# Patient Record
Sex: Female | Born: 1995
Health system: Southern US, Community
[De-identification: ages and names within clinical notes are randomized; demographics above are authoritative.]

## PROBLEM LIST (undated history)

## (undated) DIAGNOSIS — K219 Gastro-esophageal reflux disease without esophagitis: Secondary | ICD-10-CM

## (undated) DIAGNOSIS — R55 Syncope and collapse: Secondary | ICD-10-CM

## (undated) DIAGNOSIS — R0600 Dyspnea, unspecified: Secondary | ICD-10-CM

## (undated) DIAGNOSIS — IMO0001 Reserved for inherently not codable concepts without codable children: Secondary | ICD-10-CM

## (undated) DIAGNOSIS — R519 Headache, unspecified: Secondary | ICD-10-CM

## (undated) DIAGNOSIS — R51 Headache: Secondary | ICD-10-CM

## (undated) DIAGNOSIS — Z9289 Personal history of other medical treatment: Secondary | ICD-10-CM

## (undated) DIAGNOSIS — F5000 Anorexia nervosa, unspecified: Secondary | ICD-10-CM

## (undated) DIAGNOSIS — E282 Polycystic ovarian syndrome: Secondary | ICD-10-CM

## (undated) DIAGNOSIS — I10 Essential (primary) hypertension: Secondary | ICD-10-CM

## (undated) HISTORY — DX: Polycystic ovarian syndrome: E28.2

## (undated) HISTORY — DX: Reserved for inherently not codable concepts without codable children: IMO0001

## (undated) HISTORY — DX: Gastro-esophageal reflux disease without esophagitis: K21.9

## (undated) HISTORY — PX: OTHER SURGICAL HISTORY: SHX169

## (undated) HISTORY — DX: Morbid (severe) obesity due to excess calories: E66.01

## (undated) HISTORY — DX: Anorexia nervosa, unspecified: F50.00

## (undated) HISTORY — DX: Syncope and collapse: R55

---
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
## 2015-02-07 ENCOUNTER — Telehealth: Payer: Self-pay | Admitting: Internal Medicine

## 2015-02-07 NOTE — Telephone Encounter (Signed)
Her parents are patients of yours. Her father is Zohair Coverdale MRN [id number]. They were wondering if she can establish care with you along with her twin sister.  ° °

## 2015-02-10 NOTE — Telephone Encounter (Signed)
yes

## 2015-02-20 ENCOUNTER — Other Ambulatory Visit (INDEPENDENT_AMBULATORY_CARE_PROVIDER_SITE_OTHER): Payer: 59

## 2015-02-20 ENCOUNTER — Ambulatory Visit: Payer: No Typology Code available for payment source | Admitting: Internal Medicine

## 2015-02-20 ENCOUNTER — Ambulatory Visit (INDEPENDENT_AMBULATORY_CARE_PROVIDER_SITE_OTHER): Payer: 59 | Admitting: Internal Medicine

## 2015-02-20 ENCOUNTER — Encounter: Payer: Self-pay | Admitting: Internal Medicine

## 2015-02-20 VITALS — BP 142/78 | HR 80 | Temp 98.5°F | Resp 18 | Ht 64.0 in | Wt 298.0 lb

## 2015-02-20 DIAGNOSIS — K219 Gastro-esophageal reflux disease without esophagitis: Secondary | ICD-10-CM

## 2015-02-20 DIAGNOSIS — I10 Essential (primary) hypertension: Secondary | ICD-10-CM | POA: Insufficient documentation

## 2015-02-20 HISTORY — DX: Essential (primary) hypertension: I10

## 2015-02-20 LAB — LIPID PANEL
Cholesterol: 158 mg/dL (ref 0–200)
HDL: 32.9 mg/dL — ABNORMAL LOW (ref >39.00)
LDL Cholesterol: 99 mg/dL (ref 0–99)
NonHDL: 125.1
Total CHOL/HDL Ratio: 5
Triglycerides: 130 mg/dL (ref 0.0–149.0)
VLDL: 26 mg/dL (ref 0.0–40.0)

## 2015-02-20 LAB — COMPREHENSIVE METABOLIC PANEL
ALT: 16 U/L (ref 0–35)
AST: 15 U/L (ref 0–37)
Albumin: 3.6 g/dL (ref 3.5–5.2)
Alkaline Phosphatase: 72 U/L (ref 47–119)
BUN: 11 mg/dL (ref 6–23)
CO2: 25 mEq/L (ref 19–32)
Calcium: 9.4 mg/dL (ref 8.4–10.5)
Chloride: 104 mEq/L (ref 96–112)
Creatinine, Ser: 0.56 mg/dL (ref 0.40–1.20)
GFR: 147.89 mL/min (ref >60.00)
Glucose, Bld: 114 mg/dL — ABNORMAL HIGH (ref 70–99)
Potassium: 3.5 mEq/L (ref 3.5–5.1)
Sodium: 137 mEq/L (ref 135–145)
Total Bilirubin: 0.2 mg/dL (ref 0.2–1.2)
Total Protein: 7.6 g/dL (ref 6.0–8.3)

## 2015-02-20 LAB — CBC WITH DIFFERENTIAL/PLATELET
Basophils Absolute: 0 10*3/uL (ref 0.0–0.1)
Basophils Relative: 0.4 % (ref 0.0–3.0)
Eosinophils Absolute: 0.1 10*3/uL (ref 0.0–0.7)
Eosinophils Relative: 1.4 % (ref 0.0–5.0)
HCT: 39.5 % (ref 36.0–49.0)
Hemoglobin: 13 g/dL (ref 12.0–16.0)
Lymphocytes Relative: 43.1 % (ref 24.0–48.0)
Lymphs Abs: 4.2 10*3/uL — ABNORMAL HIGH (ref 0.7–4.0)
MCHC: 32.8 g/dL (ref 31.0–37.0)
MCV: 75.3 fl — ABNORMAL LOW (ref 78.0–98.0)
Monocytes Absolute: 0.7 10*3/uL (ref 0.1–1.0)
Monocytes Relative: 7.1 % (ref 3.0–12.0)
Neutro Abs: 4.7 10*3/uL (ref 1.4–7.7)
Neutrophils Relative %: 48 % (ref 43.0–71.0)
Platelets: 330 10*3/uL (ref 150.0–575.0)
RBC: 5.24 Mil/uL (ref 3.80–5.70)
RDW: 14.4 % (ref 11.4–15.5)
WBC: 9.8 10*3/uL (ref 4.5–13.5)

## 2015-02-20 LAB — TSH: TSH: 1.7 u[IU]/mL (ref 0.40–5.00)

## 2015-02-20 MED ORDER — OMEPRAZOLE 20 MG PO CPDR: 20.0000 mg | DELAYED_RELEASE_CAPSULE | Freq: Every day | ORAL | Status: DC

## 2015-02-20 MED ORDER — NORGESTIM-ETH ESTRAD TRIPHASIC 0.18/0.215/0.25 MG-35 MCG PO TABS: 1.0000 | ORAL_TABLET | Freq: Every day | ORAL | Status: DC

## 2015-02-20 MED ORDER — LISINOPRIL 5 MG PO TABS: 10.0000 mg | ORAL_TABLET | Freq: Every day | ORAL | Status: DC

## 2015-02-20 MED ORDER — PRENATAL 27-0.8 MG PO TABS: 1.0000 | ORAL_TABLET | Freq: Every day | ORAL | Status: DC

## 2015-02-20 MED ORDER — SPIRONOLACTONE 25 MG PO TABS: 50.0000 mg | ORAL_TABLET | Freq: Every day | ORAL | Status: DC

## 2015-02-20 NOTE — Assessment & Plan Note (Signed)
She is doing well on the PPI Will continue 

## 2015-02-20 NOTE — Assessment & Plan Note (Signed)
Her BP is adequately well controlled Will cont the current meds She will work on her lifestyle modifications Will monitor her lytes and renal function today

## 2015-02-20 NOTE — Progress Notes (Signed)
Pre visit review using our clinic review tool, if applicable. No additional management support is needed unless otherwise documented below in the visit note. 

## 2015-02-20 NOTE — Patient Instructions (Signed)

## 2015-02-20 NOTE — Progress Notes (Signed)
   Subjective:    Patient ID: Stacey Holland, female    DOB: [birth year], 19 y.o.   MRN: [id number]  Hypertension This is a chronic problem. The current episode started more than 1 year ago. The problem has been gradually improving since onset. The problem is controlled. Pertinent negatives include no anxiety, blurred vision, chest pain, headaches, malaise/fatigue, neck pain, orthopnea, palpitations, peripheral edema, PND, shortness of breath or sweats. There are no associated agents to hypertension. Risk factors for coronary artery disease include obesity. Past treatments include diuretics and ACE inhibitors. Compliance problems include diet and exercise.       Review of Systems  Constitutional: Negative.  Negative for fever, chills, malaise/fatigue, diaphoresis, appetite change and fatigue.  HENT: Negative.   Eyes: Negative.  Negative for blurred vision.  Respiratory: Negative.  Negative for cough, choking, chest tightness, shortness of breath and stridor.   Cardiovascular: Negative.  Negative for chest pain, palpitations, orthopnea, leg swelling and PND.  Gastrointestinal: Negative.  Negative for nausea, vomiting, abdominal pain, diarrhea and constipation.  Endocrine: Negative.   Genitourinary: Negative.   Musculoskeletal: Negative.  Negative for back pain, joint swelling, arthralgias and neck pain.  Skin: Negative.  Negative for rash.  Allergic/Immunologic: Negative.   Neurological: Negative.  Negative for dizziness, syncope, speech difficulty, light-headedness, numbness and headaches.  Hematological: Negative.  Negative for adenopathy. Does not bruise/bleed easily.  Psychiatric/Behavioral: Negative.        Objective:   Physical Exam  Constitutional: She is oriented to person, place, and time. She appears well-developed and well-nourished. No distress.  HENT:  Head: Normocephalic and atraumatic.  Mouth/Throat: Oropharynx is clear and moist. No oropharyngeal exudate.  Eyes:  Conjunctivae are normal. Right eye exhibits no discharge. Left eye exhibits no discharge. No scleral icterus.  Neck: Normal range of motion. Neck supple. No JVD present. No tracheal deviation present. No thyromegaly present.  Cardiovascular: Normal rate, regular rhythm, normal heart sounds and intact distal pulses.  Exam reveals no gallop and no friction rub.   No murmur heard. Pulmonary/Chest: Effort normal and breath sounds normal. No stridor. No respiratory distress. She has no wheezes. She has no rales. She exhibits no tenderness.  Abdominal: Soft. Bowel sounds are normal. She exhibits no distension and no mass. There is no tenderness. There is no rebound and no guarding.  Musculoskeletal: Normal range of motion. She exhibits no edema or tenderness.  Lymphadenopathy:    She has no cervical adenopathy.  Neurological: She is oriented to person, place, and time.  Skin: Skin is warm and dry. No rash noted. She is not diaphoretic. No erythema. No pallor.  Psychiatric: She has a normal mood and affect. Her behavior is normal. Judgment and thought content normal.  Vitals reviewed.    No results found for: WBC, HGB, HCT, PLT, GLUCOSE, CHOL, TRIG, HDL, LDLDIRECT, LDLCALC, ALT, AST, NA, K, CL, CREATININE, BUN, CO2, TSH, PSA, INR, GLUF, HGBA1C, MICROALBUR     Assessment & Plan:

## 2015-02-21 ENCOUNTER — Encounter: Payer: Self-pay | Admitting: Internal Medicine

## 2015-04-24 ENCOUNTER — Other Ambulatory Visit: Payer: Self-pay

## 2015-04-24 ENCOUNTER — Ambulatory Visit: Payer: 59 | Admitting: Internal Medicine

## 2015-04-24 DIAGNOSIS — I10 Essential (primary) hypertension: Secondary | ICD-10-CM

## 2015-04-24 DIAGNOSIS — K219 Gastro-esophageal reflux disease without esophagitis: Secondary | ICD-10-CM

## 2015-04-24 MED ORDER — LISINOPRIL 5 MG PO TABS: 10.0000 mg | ORAL_TABLET | Freq: Every day | ORAL | Status: DC

## 2015-04-24 MED ORDER — OMEPRAZOLE 20 MG PO CPDR: 20.0000 mg | DELAYED_RELEASE_CAPSULE | Freq: Every day | ORAL | Status: DC

## 2015-04-24 MED ORDER — NORGESTIM-ETH ESTRAD TRIPHASIC 0.18/0.215/0.25 MG-35 MCG PO TABS: 1.0000 | ORAL_TABLET | Freq: Every day | ORAL | Status: DC

## 2015-07-28 ENCOUNTER — Other Ambulatory Visit: Payer: Self-pay | Admitting: Internal Medicine

## 2015-09-03 ENCOUNTER — Other Ambulatory Visit: Payer: Self-pay | Admitting: Internal Medicine

## 2015-10-29 ENCOUNTER — Other Ambulatory Visit: Payer: Self-pay | Admitting: Internal Medicine

## 2015-12-02 ENCOUNTER — Other Ambulatory Visit: Payer: Self-pay | Admitting: Internal Medicine

## 2016-01-15 ENCOUNTER — Other Ambulatory Visit: Payer: Self-pay | Admitting: Internal Medicine

## 2016-02-28 ENCOUNTER — Other Ambulatory Visit: Payer: Self-pay | Admitting: Internal Medicine

## 2016-03-06 ENCOUNTER — Other Ambulatory Visit: Payer: Self-pay | Admitting: Internal Medicine

## 2016-03-07 NOTE — Telephone Encounter (Signed)
PT informed on two fills, Will send 30 day supply Must schedule an appt for CPE. Can we get on schedule?

## 2016-03-10 ENCOUNTER — Telehealth: Payer: Self-pay | Admitting: Internal Medicine

## 2016-03-10 NOTE — Telephone Encounter (Signed)
Noted  

## 2016-03-10 NOTE — Telephone Encounter (Signed)
Left patient vm to make CPE appt within the next month in order to keep refills going.

## 2016-04-03 ENCOUNTER — Other Ambulatory Visit (INDEPENDENT_AMBULATORY_CARE_PROVIDER_SITE_OTHER): Payer: BLUE CROSS/BLUE SHIELD

## 2016-04-03 ENCOUNTER — Encounter: Payer: Self-pay | Admitting: Internal Medicine

## 2016-04-03 ENCOUNTER — Telehealth: Payer: Self-pay | Admitting: Internal Medicine

## 2016-04-03 ENCOUNTER — Ambulatory Visit (INDEPENDENT_AMBULATORY_CARE_PROVIDER_SITE_OTHER): Payer: BLUE CROSS/BLUE SHIELD | Admitting: Internal Medicine

## 2016-04-03 VITALS — BP 124/82 | HR 76 | Temp 98.3°F | Resp 20 | Wt 276.0 lb

## 2016-04-03 DIAGNOSIS — K219 Gastro-esophageal reflux disease without esophagitis: Secondary | ICD-10-CM

## 2016-04-03 DIAGNOSIS — I10 Essential (primary) hypertension: Secondary | ICD-10-CM | POA: Diagnosis not present

## 2016-04-03 DIAGNOSIS — E282 Polycystic ovarian syndrome: Secondary | ICD-10-CM | POA: Insufficient documentation

## 2016-04-03 DIAGNOSIS — R202 Paresthesia of skin: Secondary | ICD-10-CM

## 2016-04-03 DIAGNOSIS — R739 Hyperglycemia, unspecified: Secondary | ICD-10-CM

## 2016-04-03 HISTORY — DX: Morbid (severe) obesity due to excess calories: E66.01

## 2016-04-03 HISTORY — DX: Polycystic ovarian syndrome: E28.2

## 2016-04-03 LAB — CBC WITH DIFFERENTIAL/PLATELET
BASOS PCT: 1 % (ref 0.0–3.0)
Basophils Absolute: 0.1 10*3/uL (ref 0.0–0.1)
EOS PCT: 1.7 % (ref 0.0–5.0)
Eosinophils Absolute: 0.1 10*3/uL (ref 0.0–0.7)
HCT: 39.4 % (ref 36.0–46.0)
Hemoglobin: 13 g/dL (ref 12.0–15.0)
LYMPHS ABS: 3.7 10*3/uL (ref 0.7–4.0)
Lymphocytes Relative: 64.1 % — ABNORMAL HIGH (ref 12.0–46.0)
MCHC: 32.9 g/dL (ref 30.0–36.0)
MCV: 75.7 fl — AB (ref 78.0–100.0)
MONO ABS: 0.5 10*3/uL (ref 0.1–1.0)
Monocytes Relative: 8.6 % (ref 3.0–12.0)
NEUTROS ABS: 1.4 10*3/uL (ref 1.4–7.7)
NEUTROS PCT: 24.6 % — AB (ref 43.0–77.0)
PLATELETS: 221 10*3/uL (ref 150.0–400.0)
RBC: 5.21 Mil/uL — ABNORMAL HIGH (ref 3.87–5.11)
RDW: 14 % (ref 11.5–14.6)
WBC: 5.8 10*3/uL (ref 4.5–10.5)

## 2016-04-03 LAB — HEPATIC FUNCTION PANEL
ALBUMIN: 3.8 g/dL (ref 3.5–5.2)
ALT: 33 U/L (ref 0–35)
AST: 31 U/L (ref 0–37)
Alkaline Phosphatase: 42 U/L (ref 39–117)
BILIRUBIN TOTAL: 0.3 mg/dL (ref 0.2–1.2)
Bilirubin, Direct: 0.1 mg/dL (ref 0.0–0.3)
Total Protein: 6.9 g/dL (ref 6.0–8.3)

## 2016-04-03 LAB — BASIC METABOLIC PANEL
BUN: 5 mg/dL — ABNORMAL LOW (ref 6–23)
CO2: 27 mEq/L (ref 19–32)
Calcium: 9.4 mg/dL (ref 8.4–10.5)
Chloride: 103 mEq/L (ref 96–112)
Creatinine, Ser: 0.67 mg/dL (ref 0.40–1.20)
GFR: 118.88 mL/min (ref >60.00)
Glucose, Bld: 82 mg/dL (ref 70–99)
POTASSIUM: 3.7 meq/L (ref 3.5–5.1)
SODIUM: 140 meq/L (ref 135–145)

## 2016-04-03 LAB — HEMOGLOBIN A1C: Hgb A1c MFr Bld: 5.4 % (ref 4.6–6.5)

## 2016-04-03 LAB — TSH: TSH: 0.82 u[IU]/mL (ref 0.35–5.50)

## 2016-04-03 LAB — VITAMIN B12: VITAMIN B 12: 339 pg/mL (ref 211–911)

## 2016-04-03 MED ORDER — SPIRONOLACTONE 25 MG PO TABS: 50.0000 mg | ORAL_TABLET | Freq: Every day | ORAL | Status: DC

## 2016-04-03 MED ORDER — LISINOPRIL 5 MG PO TABS: 10.0000 mg | ORAL_TABLET | Freq: Every day | ORAL | Status: DC

## 2016-04-03 MED ORDER — PRENATAL 27-0.8 MG PO TABS: 1.0000 | ORAL_TABLET | Freq: Every day | ORAL | Status: DC

## 2016-04-03 MED ORDER — OMEPRAZOLE 20 MG PO CPDR: 20.0000 mg | DELAYED_RELEASE_CAPSULE | Freq: Every day | ORAL | Status: DC

## 2016-04-03 NOTE — Assessment & Plan Note (Signed)
stable overall by history and exam, recent data reviewed with pt, and pt to continue medical treatment as before,  to f/u any worsening symptoms or concerns BP Readings from Last 3 Encounters:  04/03/16 124/82  02/20/15 142/78  10/05/13 133/90

## 2016-04-03 NOTE — Patient Instructions (Signed)
Please continue all other medications as before, and refills have been done if requested.  Please have the pharmacy call with any other refills you may need.  Please continue your efforts at being more active, low cholesterol diet, and weight control.  You are otherwise up to date with prevention measures today.  Please keep your appointments with your specialists as you may have planned  Please go to the LAB in the Basement (turn left off the elevator) for the tests to be done today  You will be contacted by phone if any changes need to be made immediately.  Otherwise, you will receive a letter about your results with an explanation, but please check with MyChart first.  Please remember to sign up for MyChart if you have not done so, as this will be important to you in the future with finding out test results, communicating by private email, and scheduling acute appointments online when needed.  Please call for Neurology referral if you change your mind  Please see Dr Yetta BarreJones if not improved in 1-2 weeks

## 2016-04-03 NOTE — Assessment & Plan Note (Signed)
Minor April 2016, for hgba1c with labs, cont to work on diet and wt loss efforts

## 2016-04-03 NOTE — Telephone Encounter (Signed)
Pt request lab result. Please call her back °

## 2016-04-03 NOTE — Progress Notes (Signed)
Subjective:    Patient ID: Earley AbideNuha Z Pate, female    DOB: June 26, 1996, 20 y.o.   MRN: [id number]  HPI  Here with tingling in hands and feet for 1.5 wks, no pain, no weakness, not limited in acitivity.   Pt denies fever, wt loss, night sweats, loss of appetite, or other constitutional symptoms, except when stands for long time in one place 30 min to 1 hr can tend to be faint and tend to pass out.  Better to move around and less dizzy. Pt denies new neurological symptoms such as new headache, or facial or extremity weakness.  No rash, sweling, or joint issues.  Taking metformin for wt loss and PCOS per GYN, after glucose elev apr 2016 at 114, started 4 mo ago  No a1c done or other testing.  Has been able to lose wt. Wt Readings from Last 3 Encounters:  04/03/16 276 lb (125.193 kg)  02/20/15 298 lb (135.172 kg) (100 %*, Z = 2.79)  04/23/14 317 lb (143.79 kg) (100 %*, Z = 2.80)   * Growth percentiles are based on CDC 2-20 Years data.  Denies hyper or hypo thyroid symptoms such as voice, skin or hair change. No hx of vit B12 deficiency, though may have vit d deficiency, now taking one a day MVI. 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  Not taking fluid pill.  No hx of neurological issues before, no MRIs. Denies worsening reflux, abd pain, dysphagia, n/v, bowel change or blood. Denies worsening depressive symptoms, suicidal ideation, or panic Past Medical History  Diagnosis Date  . Reflux   . Hypertension   . History of blood transfusion   . PCOS (polycystic ovarian syndrome) 04/03/2016   Past Surgical History  Procedure Laterality Date  . Eustachion tubes      reports that she has never smoked. She has never used smokeless tobacco. She reports that she does not drink alcohol or use illicit drugs. family history includes GER disease in her father and mother; Hypertension in her father, maternal grandfather, and mother; Lactose intolerance in her brother. There is no history  of Cancer, Depression, Drug abuse, Early death, Heart disease, Hyperlipidemia, Kidney disease, Alcohol abuse, Asthma, or Stroke. No Known Allergies Current Outpatient Prescriptions on File Prior to Visit  Medication Sig Dispense Refill  . lisinopril (PRINIVIL,ZESTRIL) 5 MG tablet TAKE TWO TABLETS BY MOUTH ONCE DAILY 90 tablet 0  . Norgestimate-Ethinyl Estradiol Triphasic 0.18/0.215/0.25 MG-35 MCG tablet Take 1 tablet by mouth daily. 1 Package 11  . omeprazole (PRILOSEC) 20 MG capsule Take 1 capsule (20 mg total) by mouth daily. 30 capsule 11  . Prenatal Vit-Fe Fumarate-FA (MULTIVITAMIN-PRENATAL) 27-0.8 MG TABS tablet Take 1 tablet by mouth daily at 12 noon. 30 each 11  . spironolactone (ALDACTONE) 25 MG tablet TAKE TWO TABLETS BY MOUTH ONCE DAILY 60 tablet 0   No current facility-administered medications on file prior to visit.     Review of Systems  Constitutional: Negative for unusual diaphoresis or night sweats HENT: Negative for ear swelling or discharge Eyes: Negative for worsening visual haziness  Respiratory: Negative for choking and stridor.   Gastrointestinal: Negative for distension or worsening eructation Genitourinary: Negative for retention or change in urine volume.  Musculoskeletal: Negative for other MSK pain or swelling Skin: Negative for color change and worsening wound Neurological: Negative for tremors and numbness other than noted  Psychiatric/Behavioral: Negative for decreased concentration or agitation other than above  Objective:   Physical Exam BP 124/82 mmHg  Pulse 76  Temp(Src) 98.3 F (36.8 C) (Oral)  Resp 20  Wt 276 lb (125.193 kg)  SpO2 98% VS noted, morbid obese Constitutional: Pt appears in no apparent distress HENT: Head: NCAT.  Right Ear: External ear normal.  Left Ear: External ear normal.  Eyes: . Pupils are equal, round, and reactive to light. Conjunctivae and EOM are normal Neck: Normal range of motion. Neck supple.    Cardiovascular: Normal rate and regular rhythm.   Pulmonary/Chest: Effort normal and breath sounds without rales or wheezing.  Abd:  Soft, NT, ND, + BS Neurological: Pt is alert. Not confused , motor 5/5 intact, dtr symmetric, and sens intact to LT throughout, gait normal Skin: Skin is warm. No rash, no LE edema Psychiatric: Pt behavior is normal. No agitation.     Assessment & Plan:

## 2016-04-03 NOTE — Assessment & Plan Note (Signed)
Mild to mod, overall stable on PPI, ok for refill,  to f/u any worsening symptoms or concerns

## 2016-04-03 NOTE — Assessment & Plan Note (Signed)
Exam benign, cant r/o small fiber polyneuropathy, suggested neuro referral for NCS/EMG but declines for now as she has read on the internet that anemia can be a cause and would like this checked, for labs today as documented,  to f/u any worsening symptoms or concerns

## 2016-04-03 NOTE — Progress Notes (Signed)
Pre visit review using our clinic review tool, if applicable. No additional management support is needed unless otherwise documented below in the visit note. 

## 2016-04-04 ENCOUNTER — Other Ambulatory Visit: Payer: Self-pay | Admitting: Internal Medicine

## 2016-06-10 ENCOUNTER — Other Ambulatory Visit: Payer: Self-pay | Admitting: Internal Medicine

## 2016-06-23 ENCOUNTER — Other Ambulatory Visit: Payer: Self-pay | Admitting: Internal Medicine

## 2016-07-06 ENCOUNTER — Other Ambulatory Visit: Payer: Self-pay | Admitting: Internal Medicine

## 2016-07-07 ENCOUNTER — Telehealth: Payer: Self-pay | Admitting: *Deleted

## 2016-07-07 MED ORDER — SPIRONOLACTONE 25 MG PO TABS: 50.0000 mg | ORAL_TABLET | Freq: Every day | ORAL | 0 refills | Status: DC

## 2016-07-07 NOTE — Telephone Encounter (Signed)
Rec'd fax stating pt insurance plan covers 90 day supply on medications. Would like rx for Spironolactone sent for 90 day. Resent for 90 day...Raechel Chute/lmb

## 2016-07-21 ENCOUNTER — Observation Stay (HOSPITAL_COMMUNITY)
Admission: EM | Admit: 2016-07-21 | Discharge: 2016-07-22 | Disposition: A | Discharge status: Home / Self Care | Payer: BLUE CROSS/BLUE SHIELD | Attending: Internal Medicine | Admitting: Internal Medicine

## 2016-07-21 ENCOUNTER — Other Ambulatory Visit (INDEPENDENT_AMBULATORY_CARE_PROVIDER_SITE_OTHER): Payer: BLUE CROSS/BLUE SHIELD

## 2016-07-21 ENCOUNTER — Encounter: Payer: Self-pay | Admitting: Nurse Practitioner

## 2016-07-21 ENCOUNTER — Ambulatory Visit (HOSPITAL_COMMUNITY)
Admission: RE | Admit: 2016-07-21 | Discharge: 2016-07-21 | Disposition: A | Discharge status: Home / Self Care | Payer: BLUE CROSS/BLUE SHIELD | Source: Ambulatory Visit | Attending: Nurse Practitioner | Admitting: Nurse Practitioner

## 2016-07-21 ENCOUNTER — Ambulatory Visit (INDEPENDENT_AMBULATORY_CARE_PROVIDER_SITE_OTHER): Payer: BLUE CROSS/BLUE SHIELD | Admitting: Nurse Practitioner

## 2016-07-21 ENCOUNTER — Telehealth: Payer: Self-pay

## 2016-07-21 ENCOUNTER — Observation Stay (HOSPITAL_COMMUNITY): Payer: BLUE CROSS/BLUE SHIELD

## 2016-07-21 ENCOUNTER — Encounter (HOSPITAL_COMMUNITY): Payer: Self-pay | Admitting: Emergency Medicine

## 2016-07-21 VITALS — BP 86/62 | HR 102 | Temp 97.9°F | Wt 212.0 lb

## 2016-07-21 DIAGNOSIS — I959 Hypotension, unspecified: Secondary | ICD-10-CM | POA: Diagnosis not present

## 2016-07-21 DIAGNOSIS — K219 Gastro-esophageal reflux disease without esophagitis: Secondary | ICD-10-CM

## 2016-07-21 DIAGNOSIS — Z79899 Other long term (current) drug therapy: Secondary | ICD-10-CM | POA: Diagnosis not present

## 2016-07-21 DIAGNOSIS — R1011 Right upper quadrant pain: Secondary | ICD-10-CM

## 2016-07-21 DIAGNOSIS — I10 Essential (primary) hypertension: Secondary | ICD-10-CM | POA: Diagnosis not present

## 2016-07-21 DIAGNOSIS — Z7984 Long term (current) use of oral hypoglycemic drugs: Secondary | ICD-10-CM | POA: Insufficient documentation

## 2016-07-21 DIAGNOSIS — E876 Hypokalemia: Secondary | ICD-10-CM | POA: Diagnosis not present

## 2016-07-21 DIAGNOSIS — R110 Nausea: Secondary | ICD-10-CM

## 2016-07-21 DIAGNOSIS — R42 Dizziness and giddiness: Secondary | ICD-10-CM | POA: Diagnosis not present

## 2016-07-21 DIAGNOSIS — E282 Polycystic ovarian syndrome: Secondary | ICD-10-CM | POA: Insufficient documentation

## 2016-07-21 DIAGNOSIS — N179 Acute kidney failure, unspecified: Principal | ICD-10-CM | POA: Diagnosis present

## 2016-07-21 DIAGNOSIS — K8020 Calculus of gallbladder without cholecystitis without obstruction: Secondary | ICD-10-CM | POA: Insufficient documentation

## 2016-07-21 DIAGNOSIS — Z6838 Body mass index (BMI) 38.0-38.9, adult: Secondary | ICD-10-CM | POA: Insufficient documentation

## 2016-07-21 DIAGNOSIS — G4733 Obstructive sleep apnea (adult) (pediatric): Secondary | ICD-10-CM | POA: Insufficient documentation

## 2016-07-21 DIAGNOSIS — E669 Obesity, unspecified: Secondary | ICD-10-CM | POA: Diagnosis not present

## 2016-07-21 LAB — BASIC METABOLIC PANEL
ANION GAP: 10 (ref 5–15)
BUN: 38 mg/dL — AB (ref 6–20)
CALCIUM: 9.1 mg/dL (ref 8.9–10.3)
CO2: 25 mmol/L (ref 22–32)
Chloride: 105 mmol/L (ref 101–111)
Creatinine, Ser: 2.93 mg/dL — ABNORMAL HIGH (ref 0.44–1.00)
GFR calc Af Amer: 25 mL/min — ABNORMAL LOW (ref >60)
GFR, EST NON AFRICAN AMERICAN: 22 mL/min — AB (ref >60)
GLUCOSE: 95 mg/dL (ref 65–99)
POTASSIUM: 3.1 mmol/L — AB (ref 3.5–5.1)
SODIUM: 140 mmol/L (ref 135–145)

## 2016-07-21 LAB — POCT URINALYSIS DIPSTICK
Bilirubin, UA: 1
Blood, UA: 10
Glucose, UA: NEGATIVE
KETONES UA: 5
LEUKOCYTES UA: NEGATIVE
Nitrite, UA: NEGATIVE
PH UA: 6
PROTEIN UA: 30
Urobilinogen, UA: 0.2

## 2016-07-21 LAB — COMPREHENSIVE METABOLIC PANEL
ALT: 29 U/L (ref 0–35)
AST: 18 U/L (ref 0–37)
Albumin: 3.7 g/dL (ref 3.5–5.2)
Alkaline Phosphatase: 41 U/L (ref 39–117)
BUN: 42 mg/dL — ABNORMAL HIGH (ref 6–23)
CO2: 31 meq/L (ref 19–32)
Calcium: 9.7 mg/dL (ref 8.4–10.5)
Chloride: 97 mEq/L (ref 96–112)
Creatinine, Ser: 4.63 mg/dL (ref 0.40–1.20)
GFR: 12.74 mL/min — AB (ref >60.00)
GLUCOSE: 88 mg/dL (ref 70–99)
POTASSIUM: 3.9 meq/L (ref 3.5–5.1)
Sodium: 138 mEq/L (ref 135–145)
Total Bilirubin: 0.3 mg/dL (ref 0.2–1.2)
Total Protein: 7.5 g/dL (ref 6.0–8.3)

## 2016-07-21 LAB — CBC WITH DIFFERENTIAL/PLATELET
BASOS PCT: 0 %
Basophils Absolute: 0 10*3/uL (ref 0.0–0.1)
EOS ABS: 0.1 10*3/uL (ref 0.0–0.7)
EOS PCT: 1 %
HCT: 36.3 % (ref 36.0–46.0)
HEMOGLOBIN: 11.8 g/dL — AB (ref 12.0–15.0)
LYMPHS ABS: 4.5 10*3/uL — AB (ref 0.7–4.0)
Lymphocytes Relative: 51 %
MCH: 25.9 pg — AB (ref 26.0–34.0)
MCHC: 32.5 g/dL (ref 30.0–36.0)
MCV: 79.6 fL (ref 78.0–100.0)
MONO ABS: 0.6 10*3/uL (ref 0.1–1.0)
MONOS PCT: 7 %
NEUTROS PCT: 41 %
Neutro Abs: 3.6 10*3/uL (ref 1.7–7.7)
PLATELETS: 286 10*3/uL (ref 150–400)
RBC: 4.56 MIL/uL (ref 3.87–5.11)
RDW: 13.1 % (ref 11.5–15.5)
WBC: 8.8 10*3/uL (ref 4.0–10.5)

## 2016-07-21 LAB — LIPASE, BLOOD: LIPASE: 35 U/L (ref 11–51)

## 2016-07-21 LAB — LIPASE: Lipase: 25 U/L (ref 11.0–59.0)

## 2016-07-21 LAB — H. PYLORI ANTIBODY, IGG: H Pylori IgG: NEGATIVE

## 2016-07-21 MED ORDER — ONDANSETRON HCL 4 MG PO TABS: 4.0000 mg | ORAL_TABLET | Freq: Three times a day (TID) | ORAL | 0 refills | Status: DC | PRN

## 2016-07-21 MED ORDER — SODIUM CHLORIDE 0.9 % IV SOLN
INTRAVENOUS | Status: DC
Start: 2016-07-21 — End: 2016-07-21
  Administered 2016-07-21: 17:00:00 via INTRAVENOUS

## 2016-07-21 MED ORDER — SODIUM CHLORIDE 0.9 % IV BOLUS (SEPSIS)
1000.0000 mL | Freq: Once | INTRAVENOUS | Status: AC
  Administered 2016-07-21: 1000 mL via INTRAVENOUS

## 2016-07-21 MED ORDER — ACETAMINOPHEN 325 MG PO TABS: 650.0000 mg | ORAL_TABLET | Freq: Four times a day (QID) | ORAL | Status: DC | PRN

## 2016-07-21 MED ORDER — OMEPRAZOLE 20 MG PO CPDR: 20.0000 mg | DELAYED_RELEASE_CAPSULE | Freq: Two times a day (BID) | ORAL | 3 refills | Status: DC

## 2016-07-21 MED ORDER — SODIUM CHLORIDE 0.9 % IV SOLN: INTRAVENOUS | Status: DC

## 2016-07-21 MED ORDER — SODIUM CHLORIDE 0.9% FLUSH
3.0000 mL | Freq: Two times a day (BID) | INTRAVENOUS | Status: DC
  Administered 2016-07-21 – 2016-07-22 (×2): 3 mL via INTRAVENOUS

## 2016-07-21 MED ORDER — ONDANSETRON HCL 4 MG PO TABS
4.0000 mg | ORAL_TABLET | Freq: Four times a day (QID) | ORAL | Status: DC | PRN
Start: 2016-07-21 — End: 2016-07-22

## 2016-07-21 MED ORDER — ONDANSETRON HCL 4 MG/2ML IJ SOLN
4.0000 mg | Freq: Four times a day (QID) | INTRAMUSCULAR | Status: DC | PRN
  Administered 2016-07-22: 4 mg via INTRAVENOUS
  Filled 2016-07-21: qty 2

## 2016-07-21 MED ORDER — LACTATED RINGERS IV SOLN
INTRAVENOUS | Status: DC
  Administered 2016-07-21 – 2016-07-22 (×2): via INTRAVENOUS

## 2016-07-21 MED ORDER — ACETAMINOPHEN 650 MG RE SUPP
650.0000 mg | Freq: Four times a day (QID) | RECTAL | Status: DC | PRN
Start: 2016-07-21 — End: 2016-07-22

## 2016-07-21 MED ORDER — PANTOPRAZOLE SODIUM 40 MG PO TBEC
40.0000 mg | DELAYED_RELEASE_TABLET | Freq: Every day | ORAL | Status: DC
  Administered 2016-07-22: 40 mg via ORAL
  Filled 2016-07-21: qty 1

## 2016-07-21 NOTE — Progress Notes (Signed)
Pt is c/o of inability to void, pt is having urge, but no output. Bladder scanned pt and 200+ml are  noted , Yates, MD contacted to report findings and stated when pt reaches call her back and order for cath would be place. Pt recheck and retention of 400+ml is noted, and pt c/o discomfort, MD placed order for urethral cath.

## 2016-07-21 NOTE — ED Notes (Signed)
MD at bedside. 

## 2016-07-21 NOTE — Assessment & Plan Note (Signed)
Stop lisinopril and spironolactone

## 2016-07-21 NOTE — Progress Notes (Signed)
Pre visit review using our clinic review tool, if applicable. No additional management support is needed unless otherwise documented below in the visit note. 

## 2016-07-21 NOTE — Patient Instructions (Signed)
Eat small regular meals. Avoid spicy, greasy and excess dairy foods. Encourage regular daily exercise (at least 30 minutes a day) Encourage adequate oral hydration with water and Gatorade. Return to office in 1 week for follow-up.  Will call with lab and abdominal ultrasound results.

## 2016-07-21 NOTE — Assessment & Plan Note (Signed)
Increase omeprazole to BID before meals

## 2016-07-21 NOTE — ED Notes (Signed)
Charge nurse will call back and would not start timer until she assigned bed, This is why patient delay in emergency room,,Bed was on track board.

## 2016-07-21 NOTE — ED Notes (Signed)
Pt  Was not able to urinate.

## 2016-07-21 NOTE — Progress Notes (Signed)
Reviewed with patient in office. See office note

## 2016-07-21 NOTE — H&P (Signed)
History and Physical    Stacey Holland:[id number] DOB: 10/12/1996 DOA: 07/21/2016  PCP: Sanda Linger, MD Consultants:  Barnabas Lister Patient coming from: lives at home with parents and siblings  Chief Complaint: abdominal pain with radiation to the back   HPI: Stacey Holland is a 20 y.o. female with medical history significant of HTN, morbid obesity, PCOS, OSA, and reflux.  Patient developed LUQ pain that radiated around to her back.  Felt like a discomfort, not a pain.  Went to PCP and he sent her here.  Had labs and RUQ [place].  Started on BP meds 2 years ago.  Thought to be 2* to obesity and OSA, no secondary HTN work-up done, has never had a renal US.  BP was a little bit high throughout life - twin gestation, born premature, she stayed in the NICU while twin went home, had PFO and other issues.  Does have PCOS and had a pelvic ultrasound for that.  Has been on Lisinopril for the last 2 years and Spironolactone intermittently.  100 weight loss since February with dietary changes.     ED Course: Per Dr. Freida Busman: Will start IV hydration here and patient will be admitted for acute kidney injury   Review of Systems: As per HPI; otherwise 10 point review of systems reviewed and negative.    Ambulatory Status:  Ambulates without assistance  Past Medical History:  Diagnosis Date  . History of blood transfusion    prematurity, this was during perinatal period  . Hypertension   . Morbid obesity (HCC) 04/03/2016  . PCOS (polycystic ovarian syndrome) 04/03/2016  . Reflux     Past Surgical History:  Procedure Laterality Date  . eustachion tubes      Social History   Social History  . Marital status: Single    Spouse name: N/A  . Number of children: N/A  . Years of education: N/A   Occupational History  . Not on file.   Social History Main Topics  . Smoking status: Never Smoker  . Smokeless tobacco: Never Used  . Alcohol use No  . Drug use: No  . Sexual activity: No   Other  Topics Concern  . Not on file   Social History Narrative   Studying mass communications at [employer]    Allergies  Allergen Reactions  . Pineapple Other (See Comments)    Reaction:  Burning   . Other Itching, Swelling, Rash and Other (See Comments)    Reaction:  Burning Pt states that she is allergic to all hair dye.      Family History  Problem Relation Age of Onset  . GER disease Mother   . Hypertension Mother   . GER disease Father   . Hypertension Father   . Lactose intolerance Brother   . Hypertension Maternal Grandfather   . Cancer Neg Hx   . Depression Neg Hx   . Drug abuse Neg Hx   . Early death Neg Hx   . Heart disease Neg Hx   . Hyperlipidemia Neg Hx   . Kidney disease Neg Hx   . Alcohol abuse Neg Hx   . Asthma Neg Hx   . Stroke Neg Hx     Prior to Admission medications   Medication Sig Start Date End Date Taking? Authorizing Provider  Biotin 1000 MCG tablet Take 1,000 mcg by mouth at bedtime.   Yes Historical Provider, MD  metFORMIN (GLUCOPHAGE-XR) 500 MG 24 hr tablet Take 500 mg  by mouth at bedtime.    Yes Historical Provider, MD  Multiple Vitamin (MULTIVITAMIN WITH MINERALS) TABS tablet Take 1 tablet by mouth at bedtime.   Yes Historical Provider, MD  Norgestimate-Ethinyl Estradiol Triphasic (TRI-PREVIFEM) 0.18/0.215/0.25 MG-35 MCG tablet Take 1 tablet by mouth at bedtime.   Yes Historical Provider, MD  omeprazole (PRILOSEC) 20 MG capsule Take 20 mg by mouth at bedtime.   Yes Historical Provider, MD  ondansetron (ZOFRAN) 4 MG tablet Take 1 tablet (4 mg total) by mouth every 8 (eight) hours as needed for nausea or vomiting. 07/21/16   Anne Ngharlotte Lum Nche, NP    Physical Exam: Vitals:   07/21/16 1801 07/21/16 1848 07/21/16 1954 07/21/16 2024  BP:  116/63 112/73 (!) 117/51  Pulse: 86 82 93 (!) 103  Resp:  14 16 16   Temp:    98.1 F (36.7 C)  TempSrc:    Oral  SpO2: 100% 100% 99%   Weight:    101.3 kg (223 lb 6.4 oz)  Height:    5\' 4"  (1.626 m)       General: Appears calm and comfortable and is NAD  Eyes: PERRL, EOMI, normal lids, iris  ENT: grossly normal hearing, lips & tongue, mmm  Neck: no LAD, masses or thyromegaly  Cardiovascular: RRR, no m/r/g. No LE edema.   Respiratory: CTA bilaterally, no w/r/r. Normal respiratory effort.  Abdomen:  soft, ntnd, NABS  Skin: no rash or induration seen on limited exam  Musculoskeletal: grossly normal tone BUE/BLE, good ROM, no bony abnormality  Psychiatric: grossly normal mood and affect, speech fluent and appropriate, AOx3  Neurologic: CN 2-12 grossly intact, moves all extremities in coordinated fashion, sensation intact  Labs on Admission: I have personally reviewed following labs and imaging studies  CBC:  Recent Labs Lab 07/21/16 1617  WBC 8.8  NEUTROABS 3.6  HGB 11.8*  HCT 36.3  MCV 79.6  PLT 286   Basic Metabolic Panel:  Recent Labs Lab 07/21/16 1143 07/21/16 2252  NA 138 140  K 3.9 3.1*  CL 97 105  CO2 31 25  GLUCOSE 88 95  BUN 42* 38*  CREATININE 4.63* 2.93*  CALCIUM 9.7 9.1   GFR: Estimated Creatinine Clearance: 35.4 mL/min (by C-G formula based on SCr of 2.93 mg/dL). Liver Function Tests:  Recent Labs Lab 07/21/16 1143  AST 18  ALT 29  ALKPHOS 41  BILITOT 0.3  PROT 7.5  ALBUMIN 3.7    Recent Labs Lab 07/21/16 1143 07/21/16 1617  LIPASE 25.0 35   No results for input(s): AMMONIA in the last 168 hours. Coagulation Profile: No results for input(s): INR, PROTIME in the last 168 hours. Cardiac Enzymes: No results for input(s): CKTOTAL, CKMB, CKMBINDEX, TROPONINI in the last 168 hours. BNP (last 3 results) No results for input(s): PROBNP in the last 8760 hours. HbA1C: No results for input(s): HGBA1C in the last 72 hours. CBG: No results for input(s): GLUCAP in the last 168 hours. Lipid Profile: No results for input(s): CHOL, HDL, LDLCALC, TRIG, CHOLHDL, LDLDIRECT in the last 72 hours. Thyroid Function Tests: No results for  input(s): TSH, T4TOTAL, FREET4, T3FREE, THYROIDAB in the last 72 hours. Anemia Panel: No results for input(s): VITAMINB12, FOLATE, FERRITIN, TIBC, IRON, RETICCTPCT in the last 72 hours. Urine analysis:    Component Value Date/Time   BILIRUBINUR 1 07/21/2016 1101   PROTEINUR 30 07/21/2016 1101   UROBILINOGEN 0.2 07/21/2016 1101   NITRITE neg 07/21/2016 1101   LEUKOCYTESUR Negative 07/21/2016 1101  Creatinine Clearance: Estimated Creatinine Clearance: 35.4 mL/min (by C-G formula based on SCr of 2.93 mg/dL).  Sepsis Labs: @LABRCNTIP (procalcitonin:4,lacticidven:4) )No results found for this or any previous visit (from the past 240 hour(s)).   Radiological Exams on Admission: US Abdomen Complete  Result Date: 07/21/2016 CLINICAL DATA:  Right upper quadrant pain.  Nausea with vomiting. EXAM: ABDOMEN ULTRASOUND COMPLETE COMPARISON:  None. FINDINGS: Gallbladder: Stones and sludge are seen in the gallbladder with no wall thickening or pericholecystic fluid. No Murphy's sign. Common bile duct: Diameter: 2.5 mm Liver: No focal lesion identified. Within normal limits in parenchymal echogenicity. IVC: No abnormality visualized. Pancreas: Visualized portion unremarkable. Spleen: Size and appearance within normal limits. Right Kidney: Length: 12 cm. Echogenicity within normal limits. No mass or hydronephrosis visualized. Left Kidney: Length: 12.1 cm. Echogenicity within normal limits. No mass or hydronephrosis visualized. Abdominal aorta: No aneurysm visualized. Other findings: None. IMPRESSION: Cholelithiasis and sludge are visualized in the gallbladder with no wall thickening, pericholecystic fluid, or Murphy's sign. If there is concern for acute cholecystitis, a HIDA scan could further evaluate. Electronically Signed   By: Gerome Sam III M.D   On: 07/21/2016 12:58    EKG: Not done   Assessment/Plan Primary Problem:   AKI (acute kidney injury) (HCC) Active problems:   HTN   Obesity    Cholelithiasis   OSA   GERD   PCOS    AKI -It is unusual for a patient to have spontaneous development of AKI likely related to chronic medications, particularly one so young -Suspect that this was multifactorial and related to fasting/inadequate PO hydration (has been working hard to lose weight) in conjunction with medications including Lisinopril, Spironolactone, and Metformin -For now, will place in observation status on telemetry, although will plan to d/c tele if improving (once we affirm that hyperkalemia isn't likely to be an issue) -This likely was subacute in development, as the patient's potassium is actually low normal -Consider outpatient nephrology evaluation if patient responds to treatment (checking BMP now) or inpatient evaluation if not -Assuming she responds well to IVF, likely discharge within 48 hours  HTN -Will complete an evaluation for secondary HTN including CBC (done), BMP (done), fasting glucose (AM BMP), fasting lipid panel, echocardiogram (looking for LVH), and renal US -Will hold all medications during hospitalization and possibly even afterwards -Would slowly add back the medications prn  Obesity  -The patient has worked extremely hard at her dietary efforts and hopefully this episode won't discourage her from continuing   -Despite 100 pounds of weight loss since February, her BMI is 38.3   -Praise and ongoing encouragement provided  Cholelithiasis   -Patient presented with RUQ pain with radiation to the back that may have been symptomatic cholelithiasis   -With her current creatinine, she is not a surgical candidate   -Would recommend outpatient evaluation by gen surg  OSA    -Does not wear CPAP   -This may be the cause of her HTN (in conjunction with her obesity)   -Hopefully, this will improve with ongoing weight loss  GERD   -Continue Prilosec (substituted due to formulary)  PCOS   -Spironolactone and Metformin likely did help with these  symptoms, particularly hirsuitism and weight loss   -Unfortunately, for now the risk is greater than the benefit   -Would defer to outpatient PCP about when to resume   -Hold OCP due to current situation, but likely can resume this in 1-2 days    DVT prophylaxis: Early ambulation Code Status:  Full  Family Communication: Parents present throughout discussion Disposition Plan: Home once clinically improved Consults called: None Admission status: Observation, telemetry  Jonah Blue MD [facility]  If 7PM-7AM, please contact night-coverage [email or web address] Password [facility]  07/22/2016, 12:14 AM

## 2016-07-21 NOTE — ED Triage Notes (Signed)
Pt states that she was seen at [facility] today and had blood taken and an ultrasound for R sided flank pain x 2 days. States that she was sent here because her kidney function was low. Alert and oriented.

## 2016-07-21 NOTE — ED Provider Notes (Signed)
[facility]-EMERGENCY DEPT Provider Note   CSN: [id number] Arrival date & time: 07/21/16  1534     History   Chief Complaint Chief Complaint  Patient presents with  . Flank Pain  . Abnormal Lab    HPI Stacey Holland is a 20 y.o. female.  20 year old female presents with increased creatinine was diagnosed by her doctor. She had blood work today which showed a creatinine 4.6. Patient had been taking lisinopril as well as spironolactone. She denies any shortness of breath or chest discomfort. Denies lower extremity edema. Has been seen recently for right-sided flank pain radiating to right upper quadrant ultrasound to evaluate that was stitched show some gallstones with sludge. Denies any fever or chills. Does have a known history of hypertension.      Past Medical History:  Diagnosis Date  . History of blood transfusion   . Hypertension   . Morbid obesity (HCC) 04/03/2016  . PCOS (polycystic ovarian syndrome) 04/03/2016  . Reflux     Patient Active Problem List   Diagnosis Date Noted  . PCOS (polycystic ovarian syndrome) 04/03/2016  . Paresthesia of upper and lower extremities of both sides 04/03/2016  . Hyperglycemia 04/03/2016  . Morbid obesity (HCC) 04/03/2016  . Essential hypertension 02/20/2015  . GE reflux     Past Surgical History:  Procedure Laterality Date  . eustachion tubes      OB History    No data available       Home Medications    Prior to Admission medications   Medication Sig Start Date End Date Taking? Authorizing Provider  Cholecalciferol (VITAMIN D-1000 MAX ST) 1000 units tablet Take 1 tablet by mouth daily.    Historical Provider, MD  metFORMIN (GLUCOPHAGE-XR) 500 MG 24 hr tablet Take 500 mg by mouth daily.    Historical Provider, MD  Norgestimate-Ethinyl Estradiol Triphasic 0.18/0.215/0.25 MG-35 MCG tablet Take 1 tablet by mouth daily. 04/24/15   Etta Grandchild, MD  omeprazole (PRILOSEC) 20 MG capsule Take 1 capsule (20 mg total) by mouth 2  (two) times daily before a meal. 07/21/16 07/21/17  Anne Ng, NP  ondansetron (ZOFRAN) 4 MG tablet Take 1 tablet (4 mg total) by mouth every 8 (eight) hours as needed for nausea or vomiting. 07/21/16   Anne Ng, NP  Prenatal Vit-Fe Fumarate-FA (MULTIVITAMIN-PRENATAL) 27-0.8 MG TABS tablet Take 1 tablet by mouth daily at 12 noon. 04/03/16   Corwin Levins, MD    Family History Family History  Problem Relation Age of Onset  . GER disease Mother   . Hypertension Mother   . GER disease Father   . Hypertension Father   . Lactose intolerance Brother   . Hypertension Maternal Grandfather   . Cancer Neg Hx   . Depression Neg Hx   . Drug abuse Neg Hx   . Early death Neg Hx   . Heart disease Neg Hx   . Hyperlipidemia Neg Hx   . Kidney disease Neg Hx   . Alcohol abuse Neg Hx   . Asthma Neg Hx   . Stroke Neg Hx     Social History Social History  Substance Use Topics  . Smoking status: Never Smoker  . Smokeless tobacco: Never Used  . Alcohol use No     Allergies   Other and Pineapple   Review of Systems Review of Systems  All other systems reviewed and are negative.    Physical Exam Updated Vital Signs BP 94/60 (BP Location: Left Arm)  Pulse 98   Temp 98.4 F (36.9 C) (Oral)   Resp 18   LMP 07/07/2016   SpO2 100%   Physical Exam  Constitutional: She is oriented to person, place, and time. She appears well-developed and well-nourished.  Non-toxic appearance. No distress.  HENT:  Head: Normocephalic and atraumatic.  Eyes: Conjunctivae, EOM and lids are normal. Pupils are equal, round, and reactive to light.  Neck: Normal range of motion. Neck supple. No tracheal deviation present. No thyroid mass present.  Cardiovascular: Normal rate, regular rhythm and normal heart sounds.  Exam reveals no gallop.   No murmur heard. Pulmonary/Chest: Effort normal and breath sounds normal. No stridor. No respiratory distress. She has no decreased breath sounds. She has  no wheezes. She has no rhonchi. She has no rales.  Abdominal: Soft. Normal appearance and bowel sounds are normal. She exhibits no distension. There is no tenderness. There is no rebound and no CVA tenderness.  Musculoskeletal: Normal range of motion. She exhibits no edema or tenderness.  Neurological: She is alert and oriented to person, place, and time. She has normal strength. No cranial nerve deficit or sensory deficit. GCS eye subscore is 4. GCS verbal subscore is 5. GCS motor subscore is 6.  Skin: Skin is warm and dry. No abrasion and no rash noted.  Psychiatric: She has a normal mood and affect. Her speech is normal and behavior is normal.  Nursing note and vitals reviewed.    ED Treatments / Results  Labs (all labs ordered are listed, but only abnormal results are displayed) Labs Reviewed  CBC WITH DIFFERENTIAL/PLATELET  URINALYSIS, ROUTINE W REFLEX MICROSCOPIC (NOT AT [facility])  LIPASE, BLOOD    EKG  EKG Interpretation None       Radiology [place] Abdomen Complete  Result Date: 07/21/2016 CLINICAL DATA:  Right upper quadrant pain.  Nausea with vomiting. EXAM: ABDOMEN ULTRASOUND COMPLETE COMPARISON:  None. FINDINGS: Gallbladder: Stones and sludge are seen in the gallbladder with no wall thickening or pericholecystic fluid. No Murphy's sign. Common bile duct: Diameter: 2.5 mm Liver: No focal lesion identified. Within normal limits in parenchymal echogenicity. IVC: No abnormality visualized. Pancreas: Visualized portion unremarkable. Spleen: Size and appearance within normal limits. Right Kidney: Length: 12 cm. Echogenicity within normal limits. No mass or hydronephrosis visualized. Left Kidney: Length: 12.1 cm. Echogenicity within normal limits. No mass or hydronephrosis visualized. Abdominal aorta: No aneurysm visualized. Other findings: None. IMPRESSION: Cholelithiasis and sludge are visualized in the gallbladder with no wall thickening, pericholecystic fluid, or Murphy's sign. If there  is concern for acute cholecystitis, a HIDA scan could further evaluate. Electronically Signed   By: Gerome Samavid  Williams III M.D   On: 07/21/2016 12:58    Procedures Procedures (including critical care time)  Medications Ordered in ED Medications  0.9 %  sodium chloride infusion (not administered)  sodium chloride 0.9 % bolus 1,000 mL (not administered)  0.9 %  sodium chloride infusion (not administered)     Initial Impression / Assessment and Plan / ED Course  I have reviewed the triage vital signs and the nursing notes.  Pertinent labs & imaging results that were available during my care of the patient were reviewed by me and considered in my medical decision making (see chart for details).  Clinical Course    Will start IV hydration here and patient will be admitted for acute kidney injury  Final Clinical Impressions(s) / ED Diagnoses   Final diagnoses:  None    New Prescriptions New Prescriptions  No medications on file     Lorre Nick, MD 07/21/16 1625

## 2016-07-21 NOTE — Telephone Encounter (Signed)
Recd call from g[facility]/lab with critical lab results :  Creatinine resulting at 4.63 and GFR 12.74---I have taken results to charlotte nche----per charlotte, I have called patient's mother and advised that kidney function labs are at critical level---patient needs to be taken to hospital emergency room immediately---mother repeated back for understanding and will be taking patient to hospital now---routing to [place]lotte, fyi.[name].[name]..[name]

## 2016-07-22 ENCOUNTER — Other Ambulatory Visit (HOSPITAL_COMMUNITY): Payer: BLUE CROSS/BLUE SHIELD

## 2016-07-22 DIAGNOSIS — N179 Acute kidney failure, unspecified: Principal | ICD-10-CM

## 2016-07-22 LAB — CBC
HCT: 30.2 % — ABNORMAL LOW (ref 36.0–46.0)
HEMOGLOBIN: 9.6 g/dL — AB (ref 12.0–15.0)
MCH: 25.8 pg — AB (ref 26.0–34.0)
MCHC: 31.8 g/dL (ref 30.0–36.0)
MCV: 81.2 fL (ref 78.0–100.0)
PLATELETS: 204 10*3/uL (ref 150–400)
RBC: 3.72 MIL/uL — ABNORMAL LOW (ref 3.87–5.11)
RDW: 13.3 % (ref 11.5–15.5)
WBC: 7.4 10*3/uL (ref 4.0–10.5)

## 2016-07-22 LAB — LIPID PANEL
CHOLESTEROL: 196 mg/dL (ref 0–200)
HDL: 39 mg/dL — AB (ref >40)
LDL Cholesterol: 115 mg/dL — ABNORMAL HIGH (ref 0–99)
Total CHOL/HDL Ratio: 5 RATIO
Triglycerides: 209 mg/dL — ABNORMAL HIGH (ref <150)
VLDL: 42 mg/dL — ABNORMAL HIGH (ref 0–40)

## 2016-07-22 LAB — BASIC METABOLIC PANEL
ANION GAP: 8 (ref 5–15)
BUN: 34 mg/dL — AB (ref 6–20)
CALCIUM: 9 mg/dL (ref 8.9–10.3)
CO2: 27 mmol/L (ref 22–32)
CREATININE: 1.85 mg/dL — AB (ref 0.44–1.00)
Chloride: 105 mmol/L (ref 101–111)
GFR calc Af Amer: 44 mL/min — ABNORMAL LOW (ref >60)
GFR, EST NON AFRICAN AMERICAN: 38 mL/min — AB (ref >60)
GLUCOSE: 91 mg/dL (ref 65–99)
Potassium: 3.3 mmol/L — ABNORMAL LOW (ref 3.5–5.1)
Sodium: 140 mmol/L (ref 135–145)

## 2016-07-22 MED ORDER — POTASSIUM CHLORIDE CRYS ER 20 MEQ PO TBCR
40.0000 meq | EXTENDED_RELEASE_TABLET | Freq: Once | ORAL | Status: AC
  Administered 2016-07-22: 40 meq via ORAL
  Filled 2016-07-22: qty 2

## 2016-07-22 NOTE — Discharge Instructions (Signed)
Acute Kidney Injury °Acute kidney injury is any condition in which there is sudden (acute) damage to the kidneys. Acute kidney injury was previously known as acute kidney failure or acute renal failure. The kidneys are two organs that lie on either side of the spine between the middle of the back and the front of the abdomen. The kidneys: °· Remove wastes and extra water from the blood.   °· Produce important hormones. These help keep bones strong, regulate blood pressure, and help create red blood cells.   °· Balance the fluids and chemicals in the blood and tissues. °A small amount of kidney damage may not cause problems, but a large amount of damage may make it difficult or impossible for the kidneys to work the way they should. Acute kidney injury may develop into long-lasting (chronic) kidney disease. It may also develop into a life-threatening disease called end-stage kidney disease. Acute kidney injury can get worse very quickly, so it should be treated right away. Early treatment may prevent other kidney diseases from developing. °CAUSES  °· A problem with blood flow to the kidneys. This may be caused by:   °¨ Blood loss.   °¨ Heart disease.   °¨ Severe burns.   °¨ Liver disease. °· Direct damage to the kidneys. This may be caused by: °¨ Some medicines.   °¨ A kidney infection.   °¨ Poisoning or consuming toxic substances.   °¨ A surgical wound.   °¨ A blow to the kidney area.   °· A problem with urine flow. This may be caused by:   °¨ Cancer.   °¨ Kidney stones.   °¨ An enlarged prostate. °SIGNS AND SYMPTOMS  °· Swelling (edema) of the legs, ankles, or feet.   °· Tiredness (lethargy).   °· Nausea or vomiting.   °· Confusion.   °· Problems with urination, such as:   °¨ Painful or burning feeling during urination.   °¨ Decreased urine production.   °¨ Frequent accidents in children who are potty trained.   °¨ Bloody urine.   °· Muscle twitches and cramps.   °· Shortness of breath.   °· Seizures.   °· Chest  pain or pressure. °Sometimes, no symptoms are present.  °DIAGNOSIS °Acute kidney injury may be detected and diagnosed by tests, including blood, urine, imaging, or kidney biopsy tests.  °TREATMENT °Treatment of acute kidney injury varies depending on the cause and severity of the kidney damage. In mild cases, no treatment may be needed. The kidneys may heal on their own. If acute kidney injury is more severe, your health care provider will treat the cause of the kidney damage, help the kidneys heal, and prevent complications from occurring. Severe cases may require a procedure to remove toxic wastes from the body (dialysis) or surgery to repair kidney damage. Surgery may involve:  °· Repair of a torn kidney.   °· Removal of an obstruction. °HOME CARE INSTRUCTIONS °· Follow your prescribed diet. °· Take medicines only as directed by your health care provider.  °· Do not take any new medicines (prescription, over-the-counter, or nutritional supplements) unless approved by your health care provider. Many medicines can worsen your kidney damage or may need to have the dose adjusted.   °· Keep all follow-up visits as directed by your health care provider. This is important. °· Observe your condition to make sure you are healing as expected. °SEEK IMMEDIATE MEDICAL CARE IF: °· You are feeling ill or have severe pain in the back or side.   °· Your symptoms return or you have new symptoms. °· You have any symptoms of end-stage kidney disease. These include:   °¨ Persistent itchiness.   °¨   Loss of appetite.   °¨ Headaches.   °¨ Abnormally dark or light skin. °¨ Numbness in the hands or feet.   °¨ Easy bruising.   °¨ Frequent hiccups.   °¨ Menstruation stops.   °· You have a fever. °· You have increased urine production. °· You have pain or bleeding when urinating. °MAKE SURE YOU:  °· Understand these instructions. °· Will watch your condition. °· Will get help right away if you are not doing well or get worse. °  °This  information is not intended to replace advice given to you by your health care provider. Make sure you discuss any questions you have with your health care provider. °  °Document Released: 05/11/2011 Document Revised: 11/16/2014 Document Reviewed: 06/24/2012 °Elsevier Interactive Patient Education ©2016 [employer] ° °

## 2016-07-22 NOTE — Progress Notes (Signed)
Completed D/C teaching. Gave Dr. Note for school. Discussed medications. Answered all questions. Patient will be D/C home in stable condition with family.

## 2016-07-22 NOTE — Discharge Summary (Signed)
Physician Discharge Summary  Stacey Holland ZOX:[id number] DOB: 07/15/1996 DOA: 07/21/2016  PCP: Stacey Linger, MD  Admit date: 07/21/2016 Discharge date: 07/22/2016  Recommendations for Outpatient Follow-up:  1. Pt will need to follow up with PCP in 2-3 weeks post discharge 2. Please obtain BMP to evaluate electrolytes and kidney function 3. Pt advised on stopping Metformin until renal function stabilizes, we have discussed other lifestyle changes to implement to help with weight loss including regular exercise such as swimming as pt reported she likes swimming, please see details below 4. Pt advised to continue discussions with PCP about these lifestyle changes   Discharge Diagnoses:  Active Problems:   AKI (acute kidney injury) [facility])  Discharge Condition: Stable  Diet recommendation: Heart healthy diet discussed in details   History of present illness:  20 y.o.femalewith medical history significant of HTN, morbid obesity, PCOS, OSA, and reflux.  Patient developed LUQ pain that radiated around to her back. She was seen by PCP who referred her to ED for further evaluation. Pt reported she is taking Metformin for weight loss and has also been on Lisinopril for the last 2 years and Spironolactone intermittently.  Hospital Course:  Active Problems:   AKI (acute kidney injury) (HCC) - appears to be pre renal in etiology and possibly exacerbated by Metformin  - IVF provided, metformin held and Cr now trending down - pt tolerating diet well and wants to go home - OK for discharge with close follow up  - stopped metformin until renal function stabilizes     RUQ pain - RUQ [place] with sludge and cholelithiasis with no evidence of cholecystitis - pt denies pain this AM and would like to go home - no need for further evaluation at this time but if pt with recurrent pain, can consider HIDA scan for further eval as elective cholecystectomy may be needed     Obesity  - Body mass index is 38.35  kg/m. - discussed with pt enrolling in gyn, she said she can go to [employer] and she said she loves swimming - I have encouraged her to start with swimming at least 30 minutes more days in week than not, so about 4 times per week - I have also encouraged downloading app on her phone [employer]itness [employer] which helps monitor daily calorie intake  - based on her weight and height we have discussed maximum calorie intake per day no more than 1500 and we have discussed trying to balance protein, carb, fat intake, I explained to her how this APP works  - pt currently does note exercise and is not monitoring her calorie intake - we have also discussed healthy dietary choices which  Include multiple servings of fresh vegetables, avoiding sodas and sugar drinks, snacks etc. - mother present at bedside and both have verbalized understanding and importance of these changes     Hypokalemia - supplemented prior to discharge - BMP In AM   Procedures/Studies: US Abdomen Complete  Result Date: 07/21/2016 CLINICAL DATA:  Right upper quadrant pain.  Nausea with vomiting. EXAM: ABDOMEN ULTRASOUND COMPLETE COMPARISON:  None. FINDINGS: Gallbladder: Stones and sludge are seen in the gallbladder with no wall thickening or pericholecystic fluid. No Murphy's sign. Common bile duct: Diameter: 2.5 mm Liver: No focal lesion identified. Within normal limits in parenchymal echogenicity. IVC: No abnormality visualized. Pancreas: Visualized portion unremarkable. Spleen: Size and appearance within normal limits. Right Kidney: Length: 12 cm. Echogenicity within normal limits. No mass or hydronephrosis visualized. Left Kidney: Length:  12.1 cm. Echogenicity within normal limits. No mass or hydronephrosis visualized. Abdominal aorta: No aneurysm visualized. Other findings: None. IMPRESSION: Cholelithiasis and sludge are visualized in the gallbladder with no wall thickening, pericholecystic fluid, or Murphy's sign. If there is concern for  acute cholecystitis, a HIDA scan could further evaluate. Electronically Signed   By: Gerome Samavid  Williams III M.D   On: 07/21/2016 12:58   [place] Renal  Result Date: 07/22/2016 CLINICAL DATA:  Right flank pain for 2 days.  Acute kidney injury. EXAM: RENAL / URINARY TRACT ULTRASOUND COMPLETE COMPARISON:  Complete abdominal ultrasound earlier this day. FINDINGS: Right Kidney: Length: 12.5 cm. Echogenicity within normal limits. No mass or hydronephrosis visualized. Left Kidney: Length: Approximately 12.3 cm. The majority of the left kidney is obscured by habitus and shadowing, bowel versus rib. There is no evidence of hydronephrosis. This is better visualized on the prior complete abdominal exam. Bladder: Decompressed by Foley catheter. IMPRESSION: Normal sonographic appearance of the right kidney. No hydronephrosis of the left kidney, poorly visualized on the current exam. Left kidney better visualized on abdominal ultrasound performed 12 hours prior. Electronically Signed   By: Rubye OaksMelanie  Ehinger M.D.   On: 07/22/2016 02:06     Consultations:  None  Antibiotics:  None  Discharge Exam: Vitals:   07/21/16 2024 07/22/16 0559  BP: (!) 117/51 (!) 103/57  Pulse: (!) 103 88  Resp: 16 16  Temp: 98.1 F (36.7 C) 98.2 F (36.8 C)   Vitals:   07/21/16 1848 07/21/16 1954 07/21/16 2024 07/22/16 0559  BP: 116/63 112/73 (!) 117/51 (!) 103/57  Pulse: 82 93 (!) 103 88  Resp: 14 16 16 16   Temp:   98.1 F (36.7 C) 98.2 F (36.8 C)  TempSrc:   Oral Oral  SpO2: 100% 99%    Weight:   101.3 kg (223 lb 6.4 oz)   Height:   5\' 4"  (1.626 m)     General: Pt is alert, follows commands appropriately, not in acute distress Cardiovascular: Regular rate and rhythm, S1/S2 +, no murmurs, no rubs, no gallops Respiratory: Clear to auscultation bilaterally, no wheezing, no crackles, no rhonchi Abdominal: Soft, non tender, non distended, bowel sounds +, no guarding Extremities: no edema, no cyanosis, pulses palpable  bilaterally DP and PT Neuro: Grossly nonfocal  Discharge Instructions  Discharge Instructions    Diet - low sodium heart healthy    Complete by:  As directed    Increase activity slowly    Complete by:  As directed        Medication List    STOP taking these medications   metFORMIN 500 MG 24 hr tablet Commonly known as:  GLUCOPHAGE-XR     TAKE these medications   Biotin 1000 MCG tablet Take 1,000 mcg by mouth at bedtime.   multivitamin with minerals Tabs tablet Take 1 tablet by mouth at bedtime.   omeprazole 20 MG capsule Commonly known as:  PRILOSEC Take 20 mg by mouth at bedtime.   ondansetron 4 MG tablet Commonly known as:  ZOFRAN Take 1 tablet (4 mg total) by mouth every 8 (eight) hours as needed for nausea or vomiting.   TRI-PREVIFEM 0.18/0.215/0.25 MG-35 MCG tablet Generic drug:  Norgestimate-Ethinyl Estradiol Triphasic Take 1 tablet by mouth at bedtime.      Follow-up Information    Stacey Lingerhomas Jones, MD .   Specialty:  Internal Medicine Contact information: [address] [place] [phone]  The results of significant diagnostics from this hospitalization (including imaging, microbiology, ancillary and laboratory) are listed below for reference.     Microbiology: No results found for this or any previous visit (from the past 240 hour(s)).   Labs: Basic Metabolic Panel:  Recent Labs Lab 07/21/16 1143 07/21/16 2252 07/22/16 0514  NA 138 140 140  K 3.9 3.1* 3.3*  CL 97 105 105  CO2 31 25 27   GLUCOSE 88 95 91  BUN 42* 38* 34*  CREATININE 4.63* 2.93* 1.85*  CALCIUM 9.7 9.1 9.0   Liver Function Tests:  Recent Labs Lab 07/21/16 1143  AST 18  ALT 29  ALKPHOS 41  BILITOT 0.3  PROT 7.5  ALBUMIN 3.7    Recent Labs Lab 07/21/16 1143 07/21/16 1617  LIPASE 25.0 35   No results for input(s): AMMONIA in the last 168 hours. CBC:  Recent Labs Lab 07/21/16 1617 07/22/16 0514  WBC 8.8 7.4   NEUTROABS 3.6  --   HGB 11.8* 9.6*  HCT 36.3 30.2*  MCV 79.6 81.2  PLT 286 204     SIGNED: Time coordinating discharge:  30 minutes  MAGICK-MYERS, ISKRA, MD  [facility] 07/22/2016, 9:58 AM Pager [phone]  If 7PM-7AM, please contact night-coverage [email or web address] Password [email or web address]

## 2016-07-27 ENCOUNTER — Other Ambulatory Visit (INDEPENDENT_AMBULATORY_CARE_PROVIDER_SITE_OTHER): Payer: BLUE CROSS/BLUE SHIELD

## 2016-07-27 ENCOUNTER — Encounter: Payer: Self-pay | Admitting: Nurse Practitioner

## 2016-07-27 ENCOUNTER — Ambulatory Visit (INDEPENDENT_AMBULATORY_CARE_PROVIDER_SITE_OTHER): Payer: BLUE CROSS/BLUE SHIELD | Admitting: Nurse Practitioner

## 2016-07-27 VITALS — BP 118/80 | HR 95 | Temp 98.7°F | Ht 64.0 in | Wt 224.1 lb

## 2016-07-27 DIAGNOSIS — Z23 Encounter for immunization: Secondary | ICD-10-CM

## 2016-07-27 DIAGNOSIS — E876 Hypokalemia: Secondary | ICD-10-CM

## 2016-07-27 DIAGNOSIS — N179 Acute kidney failure, unspecified: Secondary | ICD-10-CM | POA: Diagnosis not present

## 2016-07-27 LAB — BASIC METABOLIC PANEL
BUN: 8 mg/dL (ref 6–23)
CALCIUM: 9.1 mg/dL (ref 8.4–10.5)
CHLORIDE: 103 meq/L (ref 96–112)
CO2: 29 meq/L (ref 19–32)
CREATININE: 0.84 mg/dL (ref 0.40–1.20)
GFR: 91.29 mL/min (ref >60.00)
Glucose, Bld: 77 mg/dL (ref 70–99)
Potassium: 3.6 mEq/L (ref 3.5–5.1)
SODIUM: 141 meq/L (ref 135–145)

## 2016-07-27 LAB — HEMOGLOBIN A1C: HEMOGLOBIN A1C: 5.2 % (ref 4.6–6.5)

## 2016-07-27 NOTE — Progress Notes (Signed)
Subjective:  Patient ID: Stacey Holland, female    DOB: 1996/09/10  Age: 20 y.o. MRN: [id number]  CC: Follow-up (PT STATED NO PAIN FOR 3 DAYS AND BUT HAVE LITTLE COUGH)   Pariss Z Mcclory presents for Acute kidney injury f/up  Cough  The current episode started in the past 7 days. The problem has been unchanged. The cough is non-productive. Pertinent negatives include no chest pain, chills, fever, headaches, nasal congestion, postnasal drip, rhinorrhea, sore throat, shortness of breath or wheezing. Nothing aggravates the symptoms. She has tried nothing for the symptoms. There is no history of asthma or environmental allergies.    RUQ ABD pain: Pain and nausea has resolved.   Outpatient Medications Prior to Visit  Medication Sig Dispense Refill  . Biotin 1000 MCG tablet Take 1,000 mcg by mouth at bedtime.    . Multiple Vitamin (MULTIVITAMIN WITH MINERALS) TABS tablet Take 1 tablet by mouth at bedtime.    . Norgestimate-Ethinyl Estradiol Triphasic (TRI-PREVIFEM) 0.18/0.215/0.25 MG-35 MCG tablet Take 1 tablet by mouth at bedtime.    [name] omeprazole (PRILOSEC) 20 MG capsule Take 20 mg by mouth at bedtime.    . ondansetron (ZOFRAN) 4 MG tablet Take 1 tablet (4 mg total) by mouth every 8 (eight) hours as needed for nausea or vomiting. (Patient not taking: Reported on 07/27/2016) 30 tablet 0   No facility-administered medications prior to visit.     ROS See HPI  Objective:  BP 118/80 (BP Location: Left Arm, Patient Position: Sitting, Cuff Size: Normal)   Pulse 95   Temp 98.7 F (37.1 C)   Ht 5\' 4"  (1.626 m)   Wt 224 lb 1.3 oz (101.6 kg)   LMP 07/07/2016   SpO2 99%   BMI 38.46 kg/m   BP Readings from Last 3 Encounters:  07/27/16 118/80  07/22/16 (!) 103/57  07/21/16 (!) 86/62    Wt Readings from Last 3 Encounters:  07/27/16 224 lb 1.3 oz (101.6 kg)  07/21/16 223 lb 6.4 oz (101.3 kg)  07/21/16 212 lb (96.2 kg)    Physical Exam  Constitutional: She is oriented to person,  place, and time. No distress.  HENT:  Right Ear: External ear normal.  Left Ear: External ear normal.  Nose: Nose normal.  Mouth/Throat: No oropharyngeal exudate.  Eyes: No scleral icterus.  Neck: Normal range of motion. Neck supple. No thyromegaly present.  Cardiovascular: Normal rate, regular rhythm and normal heart sounds.   Pulmonary/Chest: Effort normal and breath sounds normal. No respiratory distress.  Abdominal: Soft. She exhibits no distension.  Musculoskeletal: Normal range of motion. She exhibits no edema.  Lymphadenopathy:    She has no cervical adenopathy.  Neurological: She is alert and oriented to person, place, and time.  Skin: Skin is warm and dry.  Psychiatric: She has a normal mood and affect. Her behavior is normal.    Lab Results  Component Value Date   WBC 7.4 07/22/2016   HGB 9.6 (L) 07/22/2016   HCT 30.2 (L) 07/22/2016   PLT 204 07/22/2016   GLUCOSE 77 07/27/2016   CHOL 196 07/21/2016   TRIG 209 (H) 07/21/2016   HDL 39 (L) 07/21/2016   LDLCALC 115 (H) 07/21/2016   ALT 29 07/21/2016   AST 18 07/21/2016   NA 141 07/27/2016   K 3.6 07/27/2016   CL 103 07/27/2016   CREATININE 0.84 07/27/2016   BUN 8 07/27/2016   CO2 29 07/27/2016   TSH 0.82 04/03/2016   HGBA1C 5.2 07/27/2016  [place] Abdomen Complete  Result Date: 07/21/2016 CLINICAL DATA:  Right upper quadrant pain.  Nausea with vomiting. EXAM: ABDOMEN ULTRASOUND COMPLETE COMPARISON:  None. FINDINGS: Gallbladder: Stones and sludge are seen in the gallbladder with no wall thickening or pericholecystic fluid. No Murphy's sign. Common bile duct: Diameter: 2.5 mm Liver: No focal lesion identified. Within normal limits in parenchymal echogenicity. IVC: No abnormality visualized. Pancreas: Visualized portion unremarkable. Spleen: Size and appearance within normal limits. Right Kidney: Length: 12 cm. Echogenicity within normal limits. No mass or hydronephrosis visualized. Left Kidney: Length: 12.1 cm.  Echogenicity within normal limits. No mass or hydronephrosis visualized. Abdominal aorta: No aneurysm visualized. Other findings: None. IMPRESSION: Cholelithiasis and sludge are visualized in the gallbladder with no wall thickening, pericholecystic fluid, or Murphy's sign. If there is concern for acute cholecystitis, a HIDA scan could further evaluate. Electronically Signed   By: Gerome Samavid  Williams III M.D   On: 07/21/2016 12:58   [place] Renal  Result Date: 07/22/2016 CLINICAL DATA:  Right flank pain for 2 days.  Acute kidney injury. EXAM: RENAL / URINARY TRACT ULTRASOUND COMPLETE COMPARISON:  Complete abdominal ultrasound earlier this day. FINDINGS: Right Kidney: Length: 12.5 cm. Echogenicity within normal limits. No mass or hydronephrosis visualized. Left Kidney: Length: Approximately 12.3 cm. The majority of the left kidney is obscured by habitus and shadowing, bowel versus rib. There is no evidence of hydronephrosis. This is better visualized on the prior complete abdominal exam. Bladder: Decompressed by Foley catheter. IMPRESSION: Normal sonographic appearance of the right kidney. No hydronephrosis of the left kidney, poorly visualized on the current exam. Left kidney better visualized on abdominal ultrasound performed 12 hours prior. Electronically Signed   By: Rubye OaksMelanie  Ehinger M.D.   On: 07/22/2016 02:06   BMP Latest Ref Rng & Units 07/27/2016 07/22/2016 07/21/2016  Glucose 70 - 99 mg/dL 77 91 95  BUN 6 - 23 mg/dL 8 14(N34(H) 82(N38(H)  Creatinine 0.40 - 1.20 mg/dL 5.620.84 1.30(Q1.85(H) 6.57(Q2.93(H)  Sodium 135 - 145 mEq/L 141 140 140  Potassium 3.5 - 5.1 mEq/L 3.6 3.3(L) 3.1(L)  Chloride 96 - 112 mEq/L 103 105 105  CO2 19 - 32 mEq/L 29 27 25   Calcium 8.4 - 10.5 mg/dL 9.1 9.0 9.1    Assessment & Plan:   [name] was seen today for follow-up.  Diagnoses and all orders for this visit:  Acute kidney injury [facility](HCC) -     Basic Metabolic Panel (BMET); Future -     Hemoglobin A1c; Future  Hypokalemia -     Basic Metabolic  Panel (BMET); Future -     Hemoglobin A1c; Future  Need for Tdap vaccination -     Tdap vaccine greater than or equal to 7yo IM  Need for prophylactic vaccination and inoculation against influenza -     Flu Vaccine QUAD 36+ mos IM  Encounter for immunization -     Flu Vaccine QUAD 36+ mos IM   I am having Ms. Peckinpaugh maintain her ondansetron, multivitamin with minerals, omeprazole, Biotin, and Norgestimate-Ethinyl Estradiol Triphasic.  No orders of the defined types were placed in this encounter.   Follow-up: Return if symptoms worsen or fail to improve.  Alysia Pennaharlotte Nche, NP

## 2016-07-27 NOTE — Progress Notes (Signed)
Normal results, see office note

## 2016-07-27 NOTE — Patient Instructions (Addendum)
May use OTC cough medication as needed Avoid use of NSAIDs at this time. Continue to hold metformin and BP medications Continue to push oral hydration and healthy diet. Maintain regular daily exercise. Will call with lab results.

## 2016-07-27 NOTE — Progress Notes (Signed)
Pre visit review using our clinic review tool, if applicable. No additional management support is needed unless otherwise documented below in the visit note. 

## 2016-08-19 ENCOUNTER — Encounter: Payer: BLUE CROSS/BLUE SHIELD | Admitting: Internal Medicine

## 2016-08-25 ENCOUNTER — Other Ambulatory Visit (INDEPENDENT_AMBULATORY_CARE_PROVIDER_SITE_OTHER): Payer: BLUE CROSS/BLUE SHIELD

## 2016-08-25 ENCOUNTER — Encounter: Payer: Self-pay | Admitting: Internal Medicine

## 2016-08-25 ENCOUNTER — Ambulatory Visit (INDEPENDENT_AMBULATORY_CARE_PROVIDER_SITE_OTHER): Payer: BLUE CROSS/BLUE SHIELD | Admitting: Internal Medicine

## 2016-08-25 DIAGNOSIS — R202 Paresthesia of skin: Secondary | ICD-10-CM

## 2016-08-25 DIAGNOSIS — R10812 Left upper quadrant abdominal tenderness: Secondary | ICD-10-CM | POA: Diagnosis not present

## 2016-08-25 DIAGNOSIS — D72820 Lymphocytosis (symptomatic): Secondary | ICD-10-CM

## 2016-08-25 DIAGNOSIS — Z0000 Encounter for general adult medical examination without abnormal findings: Secondary | ICD-10-CM

## 2016-08-25 DIAGNOSIS — R1012 Left upper quadrant pain: Secondary | ICD-10-CM | POA: Insufficient documentation

## 2016-08-25 DIAGNOSIS — D539 Nutritional anemia, unspecified: Secondary | ICD-10-CM

## 2016-08-25 LAB — CBC WITH DIFFERENTIAL/PLATELET
BASOS PCT: 1 % (ref 0.0–3.0)
Basophils Absolute: 0.1 10*3/uL (ref 0.0–0.1)
EOS PCT: 1.3 % (ref 0.0–5.0)
Eosinophils Absolute: 0.1 10*3/uL (ref 0.0–0.7)
HEMATOCRIT: 41.6 % (ref 36.0–46.0)
Hemoglobin: 14.1 g/dL (ref 12.0–15.0)
Lymphocytes Relative: 63.3 % — ABNORMAL HIGH (ref 12.0–46.0)
Lymphs Abs: 3.8 10*3/uL (ref 0.7–4.0)
MCHC: 34 g/dL (ref 30.0–36.0)
MCV: 76.8 fl — ABNORMAL LOW (ref 78.0–100.0)
MONO ABS: 0.8 10*3/uL (ref 0.1–1.0)
Monocytes Relative: 12.9 % — ABNORMAL HIGH (ref 3.0–12.0)
NEUTROS ABS: 1.3 10*3/uL — AB (ref 1.4–7.7)
Neutrophils Relative %: 21.5 % — ABNORMAL LOW (ref 43.0–77.0)
PLATELETS: 291 10*3/uL (ref 150.0–400.0)
RBC: 5.42 Mil/uL — ABNORMAL HIGH (ref 3.87–5.11)
RDW: 12.8 % (ref 11.5–14.6)
WBC: 6.1 10*3/uL (ref 4.5–10.5)

## 2016-08-25 LAB — IBC PANEL
Iron: 108 ug/dL (ref 42–145)
Saturation Ratios: 29.2 % (ref 20.0–50.0)
Transferrin: 264 mg/dL (ref 212.0–360.0)

## 2016-08-25 LAB — RETICULOCYTES
ABS Retic: 47700 cells/uL (ref 20000–80000)
RBC.: 5.3 MIL/uL — ABNORMAL HIGH (ref 3.80–5.10)
Retic Ct Pct: 0.9 %

## 2016-08-25 LAB — FOLATE

## 2016-08-25 LAB — FERRITIN: FERRITIN: 578.5 ng/mL — AB (ref 10.0–291.0)

## 2016-08-25 LAB — VITAMIN B12: VITAMIN B 12: 1311 pg/mL — AB (ref 211–911)

## 2016-08-25 NOTE — Patient Instructions (Signed)
Anemia, Nonspecific Anemia is a condition in which the concentration of red blood cells or hemoglobin in the blood is below normal. Hemoglobin is a substance in red blood cells that carries oxygen to the tissues of the body. Anemia results in not enough oxygen reaching these tissues.  CAUSES  Common causes of anemia include:   Excessive bleeding. Bleeding may be internal or external. This includes excessive bleeding from periods (in women) or from the intestine.   Poor nutrition.   Chronic kidney, thyroid, and liver disease.  Bone marrow disorders that decrease red blood cell production.  Cancer and treatments for cancer.  HIV, AIDS, and their treatments.  Spleen problems that increase red blood cell destruction.  Blood disorders.  Excess destruction of red blood cells due to infection, medicines, and autoimmune disorders. SIGNS AND SYMPTOMS   Minor weakness.   Dizziness.   Headache.  Palpitations.   Shortness of breath, especially with exercise.   Paleness.  Cold sensitivity.  Indigestion.  Nausea.  Difficulty sleeping.  Difficulty concentrating. Symptoms may occur suddenly or they may develop slowly.  DIAGNOSIS  Additional blood tests are often needed. These help your health care provider determine the best treatment. Your health care provider will check your stool for blood and look for other causes of blood loss.  TREATMENT  Treatment varies depending on the cause of the anemia. Treatment can include:   Supplements of iron, vitamin B12, or folic acid.   Hormone medicines.   A blood transfusion. This may be needed if blood loss is severe.   Hospitalization. This may be needed if there is significant continual blood loss.   Dietary changes.  Spleen removal. HOME CARE INSTRUCTIONS Keep all follow-up appointments. It often takes many weeks to correct anemia, and having your health care provider check on your condition and your response to  treatment is very important. SEEK IMMEDIATE MEDICAL CARE IF:   You develop extreme weakness, shortness of breath, or chest pain.   You become dizzy or have trouble concentrating.  You develop heavy vaginal bleeding.   You develop a rash.   You have bloody or black, tarry stools.   You faint.   You vomit up blood.   You vomit repeatedly.   You have abdominal pain.  You have a fever or persistent symptoms for more than 2-3 days.   You have a fever and your symptoms suddenly get worse.   You are dehydrated.  MAKE SURE YOU:  Understand these instructions.  Will watch your condition.  Will get help right away if you are not doing well or get worse.   This information is not intended to replace advice given to you by your health care provider. Make sure you discuss any questions you have with your health care provider.   Document Released: 12/03/2004 Document Revised: 06/28/2013 Document Reviewed: 04/21/2013 Elsevier Interactive Patient Education 2016 [employer]  

## 2016-08-25 NOTE — Progress Notes (Signed)
Pre visit review using our clinic review tool, if applicable. No additional management support is needed unless otherwise documented below in the visit note. 

## 2016-08-25 NOTE — Progress Notes (Signed)
Subjective:  Patient ID: Stacey Holland, female    DOB: Dec 18, 1995  Age: 20 y.o. MRN: [id number]  CC: Annual Exam and Anemia   HPI Stacey Holland presents for a CPX. She defers on Pap smear and she has never been sexually active. She needs a follow-up on anemia that was diagnosed about a month or 2 ago when she was admitted for acute kidney injury complicated by dehydration, use of diuretics as well as an ACE inhibitor. She's had a few episodes of left upper quadrant abdominal pain. She complains of fatigue, feeling weak, lightheaded, and dizzy.  Outpatient Medications Prior to Visit  Medication Sig Dispense Refill  . Biotin 1000 MCG tablet Take 1,000 mcg by mouth at bedtime.    . Norgestimate-Ethinyl Estradiol Triphasic (TRI-PREVIFEM) 0.18/0.215/0.25 MG-35 MCG tablet Take 1 tablet by mouth at bedtime.    [name] omeprazole (PRILOSEC) 20 MG capsule Take 20 mg by mouth at bedtime.    . ondansetron (ZOFRAN) 4 MG tablet Take 1 tablet (4 mg total) by mouth every 8 (eight) hours as needed for nausea or vomiting. 30 tablet 0  . Multiple Vitamin (MULTIVITAMIN WITH MINERALS) TABS tablet Take 1 tablet by mouth at bedtime.     No facility-administered medications prior to visit.     ROS Review of Systems  Constitutional: Positive for fatigue. Negative for activity change, appetite change, chills, diaphoresis, fever and unexpected weight change.  HENT: Negative.  Negative for facial swelling, sinus pressure and trouble swallowing.   Eyes: Negative for visual disturbance.  Respiratory: Negative for cough, choking, chest tightness, shortness of breath, wheezing and stridor.   Cardiovascular: Negative for chest pain, palpitations and leg swelling.  Gastrointestinal: Positive for abdominal pain. Negative for constipation, diarrhea, nausea and vomiting.  Endocrine: Negative.   Genitourinary: Negative.  Negative for decreased urine volume, difficulty urinating, menstrual problem, urgency and vaginal  bleeding.       Her menstrual cycles last about 4-5 days and she describes the bleeding as light  Musculoskeletal: Negative for arthralgias, back pain, joint swelling, myalgias and neck pain.  Skin: Negative.  Negative for color change, pallor and rash.  Allergic/Immunologic: Negative.   Neurological: Positive for dizziness, weakness and light-headedness. Negative for numbness and headaches.  Hematological: Negative for adenopathy. Does not bruise/bleed easily.  Psychiatric/Behavioral: Negative.     Objective:  BP 110/80 (BP Location: Left Arm, Patient Position: Sitting, Cuff Size: Normal)   Pulse 98   Temp 98.5 F (36.9 C) (Oral)   Resp 16   Ht 5\' 4"  (1.626 m)   Wt 194 lb (88 kg)   LMP 08/21/2016   SpO2 98%   BMI 33.30 kg/m   BP Readings from Last 3 Encounters:  08/25/16 110/80  07/27/16 118/80  07/22/16 (!) 103/57    Wt Readings from Last 3 Encounters:  08/25/16 194 lb (88 kg)  07/27/16 224 lb 1.3 oz (101.6 kg)  07/21/16 223 lb 6.4 oz (101.3 kg)    Physical Exam  Constitutional: She is oriented to person, place, and time. No distress.  HENT:  Mouth/Throat: Oropharynx is clear and moist. No oropharyngeal exudate.  Eyes: Conjunctivae are normal. Right eye exhibits no discharge. Left eye exhibits no discharge. No scleral icterus.  Neck: Normal range of motion. Neck supple. No JVD present. No tracheal deviation present. No thyromegaly present.  Cardiovascular: Normal rate, regular rhythm, normal heart sounds and intact distal pulses.  Exam reveals no gallop and no friction rub.   No murmur heard. Pulmonary/Chest:  Effort normal and breath sounds normal. No stridor. No respiratory distress. She has no wheezes. She has no rales. She exhibits no tenderness.  Abdominal: Soft. Normal appearance and bowel sounds are normal. She exhibits no distension and no mass. There is no hepatosplenomegaly, splenomegaly or hepatomegaly. There is tenderness in the left upper quadrant. There is  no rebound, no guarding and no CVA tenderness. No hernia. Hernia confirmed negative in the ventral area.  Musculoskeletal: Normal range of motion. She exhibits no edema, tenderness or deformity.  Lymphadenopathy:    She has no cervical adenopathy.  Neurological: She is oriented to person, place, and time.  Skin: Skin is warm and dry. No rash noted. She is not diaphoretic. No erythema. No pallor.  Vitals reviewed.   Lab Results  Component Value Date   WBC 6.1 08/25/2016   HGB 14.1 08/25/2016   HCT 41.6 08/25/2016   PLT 291.0 08/25/2016   GLUCOSE 77 07/27/2016   CHOL 196 07/21/2016   TRIG 209 (H) 07/21/2016   HDL 39 (L) 07/21/2016   LDLCALC 115 (H) 07/21/2016   ALT 29 07/21/2016   AST 18 07/21/2016   NA 141 07/27/2016   K 3.6 07/27/2016   CL 103 07/27/2016   CREATININE 0.84 07/27/2016   BUN 8 07/27/2016   CO2 29 07/27/2016   TSH 0.82 04/03/2016   HGBA1C 5.2 07/27/2016    US Abdomen Complete  Result Date: 07/21/2016 CLINICAL DATA:  Right upper quadrant pain.  Nausea with vomiting. EXAM: ABDOMEN ULTRASOUND COMPLETE COMPARISON:  None. FINDINGS: Gallbladder: Stones and sludge are seen in the gallbladder with no wall thickening or pericholecystic fluid. No Murphy's sign. Common bile duct: Diameter: 2.5 mm Liver: No focal lesion identified. Within normal limits in parenchymal echogenicity. IVC: No abnormality visualized. Pancreas: Visualized portion unremarkable. Spleen: Size and appearance within normal limits. Right Kidney: Length: 12 cm. Echogenicity within normal limits. No mass or hydronephrosis visualized. Left Kidney: Length: 12.1 cm. Echogenicity within normal limits. No mass or hydronephrosis visualized. Abdominal aorta: No aneurysm visualized. Other findings: None. IMPRESSION: Cholelithiasis and sludge are visualized in the gallbladder with no wall thickening, pericholecystic fluid, or Murphy's sign. If there is concern for acute cholecystitis, a HIDA scan could further evaluate.  Electronically Signed   By: Gerome Sam III M.D   On: 07/21/2016 12:58   US Renal  Result Date: 07/22/2016 CLINICAL DATA:  Right flank pain for 2 days.  Acute kidney injury. EXAM: RENAL / URINARY TRACT ULTRASOUND COMPLETE COMPARISON:  Complete abdominal ultrasound earlier this day. FINDINGS: Right Kidney: Length: 12.5 cm. Echogenicity within normal limits. No mass or hydronephrosis visualized. Left Kidney: Length: Approximately 12.3 cm. The majority of the left kidney is obscured by habitus and shadowing, bowel versus rib. There is no evidence of hydronephrosis. This is better visualized on the prior complete abdominal exam. Bladder: Decompressed by Foley catheter. IMPRESSION: Normal sonographic appearance of the right kidney. No hydronephrosis of the left kidney, poorly visualized on the current exam. Left kidney better visualized on abdominal ultrasound performed 12 hours prior. Electronically Signed   By: Rubye Oaks M.D.   On: 07/22/2016 02:06    Assessment & Plan:   Lynnell Grain was seen today for annual exam and anemia.  Diagnoses and all orders for this visit:  Morbid obesity (HCC)- she is working on her lifestyle modifications to lose weight.  Paresthesia of upper and lower extremities of both sides- this has resolved  Deficiency anemia- the anemia has resolved and her vitamin levels are  normal, however she has a lymphocytosis and elevated ferritin level, see comments below -     IBC panel; Future -     CBC with Differential/Platelet; Future -     Vitamin B12; Future -     Reticulocytes; Future -     Folate; Future -     Ferritin; Future -     Methylmalonic acid, serum; Future  Atypical lymphocytosis- she is symptomatic and this has been a recurrent finding for the last 4 months. She has a rather significant lymphocytosis. She also has left upper quadrant abdominal pain so I've ordered a CT w contrast to see if she has an enlarged spleen or lymphadenopathy. I've also asked her to  see hematology to see if she needs a further evaluation for possibility of lymphoproliferative disease. -     Ambulatory referral to Hematology -     CT ABDOMEN PELVIS W CONTRAST; Future  Left upper quadrant abdominal tenderness without rebound tenderness- as above -     CT ABDOMEN PELVIS W CONTRAST; Future   I have discontinued Ms. Yoakum's multivitamin with minerals. I am also having her maintain her ondansetron, omeprazole, Biotin, and Norgestimate-Ethinyl Estradiol Triphasic.  No orders of the defined types were placed in this encounter.    Follow-up: Return in about 3 months (around 11/25/2016).  Sanda Lingerhomas Christee Mervine, MD

## 2016-08-26 DIAGNOSIS — Z0000 Encounter for general adult medical examination without abnormal findings: Secondary | ICD-10-CM | POA: Insufficient documentation

## 2016-08-26 NOTE — Assessment & Plan Note (Signed)
Exam completed, labs ordered and reviewed, vaccines reviewed and updated, she deferred on a Pap smear, patient education material was given.

## 2016-08-29 LAB — METHYLMALONIC ACID, SERUM: Methylmalonic Acid, Quant: 155 nmol/L (ref 87–318)

## 2016-09-01 ENCOUNTER — Telehealth: Payer: Self-pay | Admitting: Hematology

## 2016-09-01 ENCOUNTER — Encounter: Payer: Self-pay | Admitting: Internal Medicine

## 2016-09-01 ENCOUNTER — Ambulatory Visit (INDEPENDENT_AMBULATORY_CARE_PROVIDER_SITE_OTHER)
Admission: RE | Admit: 2016-09-01 | Discharge: 2016-09-01 | Disposition: A | Discharge status: Home / Self Care | Payer: BLUE CROSS/BLUE SHIELD | Source: Ambulatory Visit | Attending: Internal Medicine | Admitting: Internal Medicine

## 2016-09-01 DIAGNOSIS — R10812 Left upper quadrant abdominal tenderness: Secondary | ICD-10-CM | POA: Diagnosis not present

## 2016-09-01 DIAGNOSIS — D72820 Lymphocytosis (symptomatic): Secondary | ICD-10-CM

## 2016-09-01 MED ORDER — IOPAMIDOL (ISOVUE-300) INJECTION 61%
100.0000 mL | Freq: Once | INTRAVENOUS | Status: AC | PRN
  Administered 2016-09-01: 100 mL via INTRAVENOUS

## 2016-09-01 NOTE — Telephone Encounter (Signed)
Pt walked in to inquire of her referral.  Pt verified her info and confirmed her appt date/time.  In basket referring appt date/time.

## 2016-09-03 ENCOUNTER — Encounter: Payer: Self-pay | Admitting: Internal Medicine

## 2016-09-03 ENCOUNTER — Ambulatory Visit (INDEPENDENT_AMBULATORY_CARE_PROVIDER_SITE_OTHER): Payer: BLUE CROSS/BLUE SHIELD | Admitting: Internal Medicine

## 2016-09-03 VITALS — BP 110/80 | HR 95 | Temp 98.1°F | Resp 16 | Ht 64.0 in | Wt 191.5 lb

## 2016-09-03 DIAGNOSIS — K8020 Calculus of gallbladder without cholecystitis without obstruction: Secondary | ICD-10-CM | POA: Diagnosis not present

## 2016-09-03 DIAGNOSIS — D72820 Lymphocytosis (symptomatic): Secondary | ICD-10-CM

## 2016-09-03 NOTE — Patient Instructions (Signed)

## 2016-09-03 NOTE — Progress Notes (Signed)
Subjective:  Patient ID: Stacey Holland, female    DOB: 1996-05-26  Age: 20 y.o. MRN: [id number]  CC: Abdominal Pain   HPI Stacey Holland presents for f/up on abd pain, her recent CT was normal but an U/S was recently positive for gallstones. She complains of intermittent episodes of sharp pain in her RUQ, LUQ, and epigastrium with a few episodes of N/V after eating greasy food.  Outpatient Medications Prior to Visit  Medication Sig Dispense Refill  . Biotin 1000 MCG tablet Take 1,000 mcg by mouth at bedtime.    . Norgestimate-Ethinyl Estradiol Triphasic (TRI-PREVIFEM) 0.18/0.215/0.25 MG-35 MCG tablet Take 1 tablet by mouth at bedtime.    [name] omeprazole (PRILOSEC) 20 MG capsule Take 20 mg by mouth at bedtime.    . ondansetron (ZOFRAN) 4 MG tablet Take 1 tablet (4 mg total) by mouth every 8 (eight) hours as needed for nausea or vomiting. 30 tablet 0   No facility-administered medications prior to visit.     ROS Review of Systems  Constitutional: Negative for activity change, appetite change, chills, fatigue, fever and unexpected weight change.  HENT: Negative.   Respiratory: Negative.  Negative for cough, choking, chest tightness, shortness of breath and stridor.   Cardiovascular: Negative.  Negative for chest pain, palpitations and leg swelling.  Gastrointestinal: Positive for abdominal pain, nausea and vomiting. Negative for abdominal distention, constipation and diarrhea.  Endocrine: Negative.   Genitourinary: Negative.  Negative for difficulty urinating.  Musculoskeletal: Negative.   Skin: Negative.   Allergic/Immunologic: Negative.   Neurological: Negative.   Hematological: Negative for adenopathy. Does not bruise/bleed easily.  Psychiatric/Behavioral: Negative.     Objective:  BP 110/80 (BP Location: Left Arm, Patient Position: Sitting, Cuff Size: Normal)   Pulse 95   Temp 98.1 F (36.7 C) (Oral)   Resp 16   Ht 5\' 4"  (1.626 m)   Wt 191 lb 8 oz (86.9 kg)   LMP  08/21/2016   SpO2 98%   BMI 32.87 kg/m   BP Readings from Last 3 Encounters:  09/03/16 110/80  08/25/16 110/80  07/27/16 118/80    Wt Readings from Last 3 Encounters:  09/03/16 191 lb 8 oz (86.9 kg)  08/25/16 194 lb (88 kg)  07/27/16 224 lb 1.3 oz (101.6 kg)    Physical Exam  Constitutional: She is oriented to person, place, and time. No distress.  HENT:  Mouth/Throat: Oropharynx is clear and moist. No oropharyngeal exudate.  Eyes: Conjunctivae are normal. Left eye exhibits no discharge. No scleral icterus.  Neck: Normal range of motion. Neck supple. No JVD present. No tracheal deviation present. No thyromegaly present.  Cardiovascular: Normal rate, regular rhythm, normal heart sounds and intact distal pulses.  Exam reveals no gallop and no friction rub.   No murmur heard. Pulmonary/Chest: Effort normal and breath sounds normal. No stridor. No respiratory distress. She has no wheezes. She has no rales. She exhibits no tenderness.  Abdominal: Soft. Normal appearance and bowel sounds are normal. She exhibits no distension and no mass. There is no hepatosplenomegaly, splenomegaly or hepatomegaly. There is tenderness in the right upper quadrant, epigastric area and left upper quadrant. There is no rigidity, no rebound, no guarding, no CVA tenderness, no tenderness at McBurney's point and negative Murphy's sign.  Musculoskeletal: Normal range of motion. She exhibits no edema, tenderness or deformity.  Lymphadenopathy:    She has no cervical adenopathy.  Neurological: She is oriented to person, place, and time.  Skin: Skin is warm  and dry. No rash noted. She is not diaphoretic. No erythema. No pallor.  Psychiatric: She has a normal mood and affect. Her behavior is normal. Judgment and thought content normal.  Vitals reviewed.   Lab Results  Component Value Date   WBC 6.1 08/25/2016   HGB 14.1 08/25/2016   HCT 41.6 08/25/2016   PLT 291.0 08/25/2016   GLUCOSE 77 07/27/2016   CHOL  196 07/21/2016   TRIG 209 (H) 07/21/2016   HDL 39 (L) 07/21/2016   LDLCALC 115 (H) 07/21/2016   ALT 29 07/21/2016   AST 18 07/21/2016   NA 141 07/27/2016   K 3.6 07/27/2016   CL 103 07/27/2016   CREATININE 0.84 07/27/2016   BUN 8 07/27/2016   CO2 29 07/27/2016   TSH 0.82 04/03/2016   HGBA1C 5.2 07/27/2016    Ct Abdomen Pelvis W Contrast  Result Date: 09/01/2016 CLINICAL DATA:  Atypical lymphocytosis. Left upper quadrant pain for several months. EXAM: CT ABDOMEN AND PELVIS WITH CONTRAST TECHNIQUE: Multidetector CT imaging of the abdomen and pelvis was performed using the standard protocol following bolus administration of intravenous contrast. CONTRAST:  100mL ISOVUE-300 IOPAMIDOL (ISOVUE-300) INJECTION 61% COMPARISON:  None. FINDINGS: Lower Chest: No acute findings. Hepatobiliary: No mass identified. Focal fatty infiltration adjacent to falciform ligament. Gallbladder is unremarkable. Pancreas:  No mass or inflammatory changes. Spleen: Within normal limits in size and appearance. Adrenals/Urinary Tract: No masses identified. No evidence of hydronephrosis. Stomach/Bowel: No evidence of obstruction, inflammatory process or abnormal fluid collections. Normal appendix visualized. Vascular/Lymphatic: No pathologically enlarged lymph nodes. No abdominal aortic aneurysm. Reproductive:  No mass identified. Other:  None. Musculoskeletal:  No suspicious bone lesions identified. IMPRESSION: No acute findings within the abdomen or pelvis. No evidence of splenomegaly or lymphadenopathy. Electronically Signed   By: Stacey RosenthalJohn  Holland M.D.   On: 09/01/2016 17:05    Assessment & Plan:   Stacey Holland was seen today for abdominal pain.  Diagnoses and all orders for this visit:  Calculus of gallbladder without cholecystitis without obstruction- will ask general surgery if her GB needs to be removed -     Ambulatory referral to General Surgery  Atypical lymphocytosis- she sees Hematology in 3 weeks about this, the  spleen was normal on recent CT scan and there was no LAD   I am having Stacey Holland maintain her ondansetron, omeprazole, Biotin, and Norgestimate-Ethinyl Estradiol Triphasic.  No orders of the defined types were placed in this encounter.    Follow-up: Return if symptoms worsen or fail to improve.  Sanda Lingerhomas Liesa Tsan, MD

## 2016-09-03 NOTE — Progress Notes (Signed)
Pre visit review using our clinic review tool, if applicable. No additional management support is needed unless otherwise documented below in the visit note. 

## 2016-09-17 ENCOUNTER — Other Ambulatory Visit (INDEPENDENT_AMBULATORY_CARE_PROVIDER_SITE_OTHER): Payer: BLUE CROSS/BLUE SHIELD

## 2016-09-17 ENCOUNTER — Ambulatory Visit (INDEPENDENT_AMBULATORY_CARE_PROVIDER_SITE_OTHER)
Admission: RE | Admit: 2016-09-17 | Discharge: 2016-09-17 | Disposition: A | Discharge status: Home / Self Care | Payer: BLUE CROSS/BLUE SHIELD | Source: Ambulatory Visit | Attending: Internal Medicine | Admitting: Internal Medicine

## 2016-09-17 ENCOUNTER — Ambulatory Visit (INDEPENDENT_AMBULATORY_CARE_PROVIDER_SITE_OTHER): Payer: BLUE CROSS/BLUE SHIELD | Admitting: Internal Medicine

## 2016-09-17 ENCOUNTER — Encounter: Payer: Self-pay | Admitting: Internal Medicine

## 2016-09-17 VITALS — BP 132/98 | HR 80 | Temp 98.0°F | Resp 16 | Wt 180.0 lb

## 2016-09-17 DIAGNOSIS — R05 Cough: Secondary | ICD-10-CM

## 2016-09-17 DIAGNOSIS — I10 Essential (primary) hypertension: Secondary | ICD-10-CM | POA: Diagnosis not present

## 2016-09-17 DIAGNOSIS — R6881 Early satiety: Secondary | ICD-10-CM | POA: Diagnosis not present

## 2016-09-17 DIAGNOSIS — R531 Weakness: Secondary | ICD-10-CM

## 2016-09-17 DIAGNOSIS — R5383 Other fatigue: Secondary | ICD-10-CM

## 2016-09-17 DIAGNOSIS — E876 Hypokalemia: Secondary | ICD-10-CM

## 2016-09-17 DIAGNOSIS — R059 Cough, unspecified: Secondary | ICD-10-CM | POA: Insufficient documentation

## 2016-09-17 LAB — CBC WITH DIFFERENTIAL/PLATELET
BASOS PCT: 1.2 % (ref 0.0–3.0)
Basophils Absolute: 0.1 10*3/uL (ref 0.0–0.1)
EOS PCT: 2 % (ref 0.0–5.0)
Eosinophils Absolute: 0.1 10*3/uL (ref 0.0–0.7)
HCT: 40.3 % (ref 36.0–46.0)
HEMOGLOBIN: 13.6 g/dL (ref 12.0–15.0)
LYMPHS ABS: 4.1 10*3/uL — AB (ref 0.7–4.0)
Lymphocytes Relative: 60 % — ABNORMAL HIGH (ref 12.0–46.0)
MCHC: 33.7 g/dL (ref 30.0–36.0)
MCV: 76.7 fl — ABNORMAL LOW (ref 78.0–100.0)
MONO ABS: 0.7 10*3/uL (ref 0.1–1.0)
Monocytes Relative: 10 % (ref 3.0–12.0)
NEUTROS PCT: 26.8 % — AB (ref 43.0–77.0)
Neutro Abs: 1.8 10*3/uL (ref 1.4–7.7)
Platelets: 347 10*3/uL (ref 150.0–400.0)
RBC: 5.24 Mil/uL — ABNORMAL HIGH (ref 3.87–5.11)
RDW: 12.9 % (ref 11.5–14.6)
WBC: 6.8 10*3/uL (ref 4.5–10.5)

## 2016-09-17 MED ORDER — HYDROCODONE-HOMATROPINE 5-1.5 MG/5ML PO SYRP: 5.0000 mL | ORAL_SOLUTION | Freq: Three times a day (TID) | ORAL | 0 refills | Status: DC | PRN

## 2016-09-17 NOTE — Patient Instructions (Signed)
Hypertension Hypertension, commonly called high blood pressure, is when the force of blood pumping through your arteries is too strong. Your arteries are the blood vessels that carry blood from your heart throughout your body. A blood pressure reading consists of a higher number over a lower number, such as 110/72. The higher number (systolic) is the pressure inside your arteries when your heart pumps. The lower number (diastolic) is the pressure inside your arteries when your heart relaxes. Ideally you want your blood pressure below 120/80. Hypertension forces your heart to work harder to pump blood. Your arteries may become narrow or stiff. Having untreated or uncontrolled hypertension can cause heart attack, stroke, kidney disease, and other problems. RISK FACTORS Some risk factors for high blood pressure are controllable. Others are not.  Risk factors you cannot control include:   Race. You may be at higher risk if you are African American.  Age. Risk increases with age.  Gender. Men are at higher risk than women before age 45 years. After age 65, women are at higher risk than men. Risk factors you can control include:  Not getting enough exercise or physical activity.  Being overweight.  Getting too much fat, sugar, calories, or salt in your diet.  Drinking too much alcohol. SIGNS AND SYMPTOMS Hypertension does not usually cause signs or symptoms. Extremely high blood pressure (hypertensive crisis) may cause headache, anxiety, shortness of breath, and nosebleed. DIAGNOSIS To check if you have hypertension, your health care provider will measure your blood pressure while you are seated, with your arm held at the level of your heart. It should be measured at least twice using the same arm. Certain conditions can cause a difference in blood pressure between your right and left arms. A blood pressure reading that is higher than normal on one occasion does not mean that you need treatment. If  it is not clear whether you have high blood pressure, you may be asked to return on a different day to have your blood pressure checked again. Or, you may be asked to monitor your blood pressure at home for 1 or more weeks. TREATMENT Treating high blood pressure includes making lifestyle changes and possibly taking medicine. Living a healthy lifestyle can help lower high blood pressure. You may need to change some of your habits. Lifestyle changes may include:  Following the DASH diet. This diet is high in fruits, vegetables, and whole grains. It is low in salt, red meat, and added sugars.  Keep your sodium intake below 2,300 mg per day.  Getting at least 30-45 minutes of aerobic exercise at least 4 times per week.  Losing weight if necessary.  Not smoking.  Limiting alcoholic beverages.  Learning ways to reduce stress. Your health care provider may prescribe medicine if lifestyle changes are not enough to get your blood pressure under control, and if one of the following is true:  You are 18-59 years of age and your systolic blood pressure is above 140.  You are 60 years of age or older, and your systolic blood pressure is above 150.  Your diastolic blood pressure is above 90.  You have diabetes, and your systolic blood pressure is over 140 or your diastolic blood pressure is over 90.  You have kidney disease and your blood pressure is above 140/90.  You have heart disease and your blood pressure is above 140/90. Your personal target blood pressure may vary depending on your medical conditions, your age, and other factors. HOME CARE INSTRUCTIONS    Have your blood pressure rechecked as directed by your health care provider.   Take medicines only as directed by your health care provider. Follow the directions carefully. Blood pressure medicines must be taken as prescribed. The medicine does not work as well when you skip doses. Skipping doses also puts you at risk for  problems.  Do not smoke.   Monitor your blood pressure at home as directed by your health care provider. SEEK MEDICAL CARE IF:   You think you are having a reaction to medicines taken.  You have recurrent headaches or feel dizzy.  You have swelling in your ankles.  You have trouble with your vision. SEEK IMMEDIATE MEDICAL CARE IF:  You develop a severe headache or confusion.  You have unusual weakness, numbness, or feel faint.  You have severe chest or abdominal pain.  You vomit repeatedly.  You have trouble breathing. MAKE SURE YOU:   Understand these instructions.  Will watch your condition.  Will get help right away if you are not doing well or get worse.   This information is not intended to replace advice given to you by your health care provider. Make sure you discuss any questions you have with your health care provider.   Document Released: 10/26/2005 Document Revised: 03/12/2015 Document Reviewed: 08/18/2013 Elsevier Interactive Patient Education 2016 [employer]  

## 2016-09-17 NOTE — Progress Notes (Signed)
Subjective:  Patient ID: Earley AbideNuha Z Blanchette, female    DOB: [birth year]  Age: 20 y.o. MRN: [id number]  CC: Abdominal Pain and Cough   HPI Murna Z Fate presents for follow-up and concerns about new symptoms.   Since I saw her she has seen a gastroenterologist for chronic abdominal pain and the surgeon does not think she has a surgical indication but wants her to see GI for ongoing symptoms. OVer the last few weeks she has developed extreme fatigue and exhaustion. She sleeps about 12 hours a night and wakes up feeling refreshed but then later in the day develops severe fatigue and exhaustion. Her mother has been driving her to classes [facility] [employer] which she continues to attend and has  Been performing well. She continues to have intermittent episodes of abdominal pain, nausea, vomiting, and early satiety. She hass lost about 100 pounds over the last 6 months. She initially lost weight intentionally but now says she is continuing to lose weight. When I asked her if she wants to gain weight she says "no I don't want to put any weight back on." She denies binging, purging, or concerns that she has anorexia or depression.  She complains that about 3 days ago she developed a cough and a sore throat. The cough is nonproductive and she denies fever, chills, night sweats, hemoptysis.  Outpatient Medications Prior to Visit  Medication Sig Dispense Refill  . Biotin 1000 MCG tablet Take 1,000 mcg by mouth at bedtime.    . Norgestimate-Ethinyl Estradiol Triphasic (TRI-PREVIFEM) 0.18/0.215/0.25 MG-35 MCG tablet Take 1 tablet by mouth at bedtime.    [name]. omeprazole (PRILOSEC) 20 MG capsule Take 20 mg by mouth at bedtime.    . ondansetron (ZOFRAN) 4 MG tablet Take 1 tablet (4 mg total) by mouth every 8 (eight) hours as needed for nausea or vomiting. 30 tablet 0   No facility-administered medications prior to visit.     ROS Review of Systems  Constitutional: Positive for activity change, appetite change, fatigue and  unexpected weight change. Negative for chills, diaphoresis and fever.  HENT: Negative.  Negative for trouble swallowing.   Eyes: Negative.   Respiratory: Positive for cough. Negative for choking, chest tightness, shortness of breath, wheezing and stridor.   Cardiovascular: Negative.  Negative for chest pain, palpitations and leg swelling.  Gastrointestinal: Positive for abdominal pain, constipation, nausea and vomiting. Negative for abdominal distention, diarrhea and rectal pain.  Endocrine: Negative.  Negative for cold intolerance and heat intolerance.  Genitourinary: Negative.  Negative for decreased urine volume, difficulty urinating, dysuria, frequency and urgency.  Musculoskeletal: Negative.   Skin: Negative.   Neurological: Positive for dizziness, weakness (she feels weak all over) and light-headedness. Negative for tremors, syncope, speech difficulty, numbness and headaches.       She has had a few episodes of feeling dizzy when she stands up with elevated heart rate and lightheadedness  Hematological: Negative for adenopathy. Does not bruise/bleed easily.  Psychiatric/Behavioral: Positive for dysphoric mood. Negative for behavioral problems, confusion, hallucinations, self-injury, sleep disturbance and suicidal ideas. The patient is not nervous/anxious and is not hyperactive.        She complains of anhedonia and feeling "emotional"    Objective:  BP (!) 132/98   Pulse 80   Temp 98 F (36.7 C) (Oral)   Resp 16   Wt 180 lb (81.6 kg)   LMP 08/21/2016   SpO2 98%   BMI 30.90 kg/m   BP Readings from Last 3 Encounters:  09/17/16 (!) 132/98  09/03/16 110/80  08/25/16 110/80    Wt Readings from Last 3 Encounters:  09/17/16 180 lb (81.6 kg)  09/03/16 191 lb 8 oz (86.9 kg)  08/25/16 194 lb (88 kg)    Physical Exam  Constitutional: She is oriented to person, place, and time. She appears well-developed and well-nourished.  Non-toxic appearance. She does not have a sickly  appearance. She appears ill. No distress.  She requested a wheelchair to take her to the lab  HENT:  Mouth/Throat: Oropharynx is clear and moist. No oropharyngeal exudate.  Eyes: Conjunctivae are normal. Right eye exhibits no discharge. Left eye exhibits no discharge. No scleral icterus.  Neck: Normal range of motion. Neck supple. No JVD present. No tracheal deviation present. No thyromegaly present.  Cardiovascular: Normal rate, regular rhythm, normal heart sounds and intact distal pulses.  Exam reveals no gallop.   No murmur heard. Pulmonary/Chest: Effort normal and breath sounds normal. No stridor. No respiratory distress. She has no wheezes. She has no rales. She exhibits no tenderness.  Abdominal: Soft. Bowel sounds are normal. She exhibits no distension and no mass. There is no tenderness. There is no rebound and no guarding.  Musculoskeletal: Normal range of motion. She exhibits no edema, tenderness or deformity.  Lymphadenopathy:    She has no cervical adenopathy.  Neurological: She is oriented to person, place, and time.  Skin: Skin is warm and dry. No rash noted. She is not diaphoretic. No erythema. No pallor.  Psychiatric: She has a normal mood and affect. Her behavior is normal. Judgment and thought content normal.  Vitals reviewed.   Lab Results  Component Value Date   WBC 6.8 09/17/2016   HGB 13.6 09/17/2016   HCT 40.3 09/17/2016   PLT 347.0 09/17/2016   GLUCOSE 75 09/17/2016   CHOL 196 07/21/2016   TRIG 209 (H) 07/21/2016   HDL 39 (L) 07/21/2016   LDLCALC 115 (H) 07/21/2016   ALT 71 (H) 09/17/2016   AST 48 (H) 09/17/2016   NA 137 09/17/2016   K 3.0 (L) 09/17/2016   CL 90 (L) 09/17/2016   CREATININE 1.14 09/17/2016   BUN 17 09/17/2016   CO2 30 09/17/2016   TSH 0.82 04/03/2016   HGBA1C 5.2 07/27/2016    Ct Abdomen Pelvis W Contrast  Result Date: 09/01/2016 CLINICAL DATA:  Atypical lymphocytosis. Left upper quadrant pain for several months. EXAM: CT ABDOMEN  AND PELVIS WITH CONTRAST TECHNIQUE: Multidetector CT imaging of the abdomen and pelvis was performed using the standard protocol following bolus administration of intravenous contrast. CONTRAST:  ISOVUE-300 IOPAMIDOL (ISOVUE-300) INJECTION 61% COMPARISON:  None. FINDINGS: Lower Chest: No acute findings. Hepatobiliary: No mass identified. Focal fatty infiltration adjacent to falciform ligament. Gallbladder is unremarkable. Pancreas:  No mass or inflammatory changes. Spleen: Within normal limits in size and appearance. Adrenals/Urinary Tract: No masses identified. No evidence of hydronephrosis. Stomach/Bowel: No evidence of obstruction, inflammatory process or abnormal fluid collections. Normal appendix visualized. Vascular/Lymphatic: No pathologically enlarged lymph nodes. No abdominal aortic aneurysm. Reproductive:  No mass identified. Other:  None. Musculoskeletal:  No suspicious bone lesions identified. IMPRESSION: No acute findings within the abdomen or pelvis. No evidence of splenomegaly or lymphadenopathy. Electronically Signed   By: Myles Rosenthal M.D.   On: 09/01/2016 17:05    Assessment & Plan:   Lynnell Grain was seen today for abdominal pain and cough.  Diagnoses and all orders for this visit:  Early satiety- GI referral as requested by general surgery to investigate  upper GI pathology -     Ambulatory referral to Gastroenterology  Fatigue, unspecified type- I will screen her for thyroid disease, adrenal disease, myasthenia gravis, anemia, pregnancy, electrolyte abnormalities, and liver disease. -     CBC with Differential/Platelet; Future -     Comprehensive metabolic panel; Future -     Thyroid Panel With TSH; Future -     Cortisol; Future -     Striated muscle antibody; Future -     ACTH; Future -     hCG, quantitative, pregnancy; Future  Weakness generalized- as above -     Cortisol; Future -     Acetylcholine receptor, binding; Future -     Striated muscle antibody; Future -      ACTH; Future  Essential hypertension- her blood pressure is not adequately well controlled, she is not willing to take an antihypertensive at this time, will check the labs as listed below to screen for secondary causes of hypertension. -     CBC with Differential/Platelet; Future -     Comprehensive metabolic panel; Future -     Thyroid Panel With TSH; Future -     ACTH; Future  Cough- her exam and chest x-ray are normal, this is most consistent with a viral URI, will treat with Hycodan as needed. -     HYDROcodone-homatropine (HYCODAN) 5-1.5 MG/5ML syrup; Take 5 mLs by mouth every 8 (eight) hours as needed for cough. -     DG Chest 2 View; Future  Hypokalemia- she has had intermittent electrolyte abnormalities off and on over the last few months and today she has hypokalemia, I'm concerned she has an eating disorder but she is not willing to discuss that at this time, will treat the hypokalemia with an oral supplementation and will continue to monitor for signs and symptoms of depression/anxiety/eating disorder. -     potassium chloride (KLOR-CON) 8 MEQ tablet; Take 1 tablet (8 mEq total) by mouth daily.   I am having Ms. Rockhold start on HYDROcodone-homatropine and potassium chloride. I am also having her maintain her ondansetron, omeprazole, Biotin, and Norgestimate-Ethinyl Estradiol Triphasic.  Meds ordered this encounter  Medications  . HYDROcodone-homatropine (HYCODAN) 5-1.5 MG/5ML syrup    Sig: Take 5 mLs by mouth every 8 (eight) hours as needed for cough.    Dispense:  120 mL    Refill:  0  . potassium chloride (KLOR-CON) 8 MEQ tablet    Sig: Take 1 tablet (8 mEq total) by mouth daily.    Dispense:  90 tablet    Refill:  2     Follow-up: Return in about 3 weeks (around 10/08/2016).  Sanda Lingerhomas Okema Rollinson, MD

## 2016-09-18 ENCOUNTER — Encounter: Payer: Self-pay | Admitting: Internal Medicine

## 2016-09-18 ENCOUNTER — Encounter: Payer: Self-pay | Admitting: Nurse Practitioner

## 2016-09-18 DIAGNOSIS — E876 Hypokalemia: Secondary | ICD-10-CM | POA: Insufficient documentation

## 2016-09-18 LAB — COMPREHENSIVE METABOLIC PANEL
ALBUMIN: 4.1 g/dL (ref 3.5–5.2)
ALT: 71 U/L — ABNORMAL HIGH (ref 0–35)
AST: 48 U/L — ABNORMAL HIGH (ref 0–37)
Alkaline Phosphatase: 64 U/L (ref 39–117)
BUN: 17 mg/dL (ref 6–23)
CHLORIDE: 90 meq/L — AB (ref 96–112)
CO2: 30 mEq/L (ref 19–32)
Calcium: 10.6 mg/dL — ABNORMAL HIGH (ref 8.4–10.5)
Creatinine, Ser: 1.14 mg/dL (ref 0.40–1.20)
GFR: 64.08 mL/min (ref >60.00)
Glucose, Bld: 75 mg/dL (ref 70–99)
POTASSIUM: 3 meq/L — AB (ref 3.5–5.1)
SODIUM: 137 meq/L (ref 135–145)
Total Bilirubin: 0.6 mg/dL (ref 0.2–1.2)
Total Protein: 8.6 g/dL — ABNORMAL HIGH (ref 6.0–8.3)

## 2016-09-18 LAB — HCG, QUANTITATIVE, PREGNANCY: QUANTITATIVE HCG: 0.12 m[IU]/mL

## 2016-09-18 LAB — CORTISOL: CORTISOL PLASMA: 8.8 ug/dL

## 2016-09-18 MED ORDER — POTASSIUM CHLORIDE ER 8 MEQ PO TBCR: 8.0000 meq | EXTENDED_RELEASE_TABLET | Freq: Every day | ORAL | 2 refills | Status: DC

## 2016-09-21 LAB — ACTH: C206 ACTH: 9 pg/mL (ref 6–50)

## 2016-09-22 ENCOUNTER — Ambulatory Visit (HOSPITAL_BASED_OUTPATIENT_CLINIC_OR_DEPARTMENT_OTHER): Payer: BLUE CROSS/BLUE SHIELD

## 2016-09-22 ENCOUNTER — Ambulatory Visit (HOSPITAL_BASED_OUTPATIENT_CLINIC_OR_DEPARTMENT_OTHER): Payer: BLUE CROSS/BLUE SHIELD | Admitting: Hematology

## 2016-09-22 ENCOUNTER — Encounter: Payer: Self-pay | Admitting: Hematology

## 2016-09-22 ENCOUNTER — Telehealth: Payer: Self-pay | Admitting: Hematology

## 2016-09-22 VITALS — BP 123/90 | HR 95 | Temp 98.6°F | Resp 17 | Ht 64.0 in | Wt 179.1 lb

## 2016-09-22 DIAGNOSIS — R945 Abnormal results of liver function studies: Secondary | ICD-10-CM

## 2016-09-22 DIAGNOSIS — E8809 Other disorders of plasma-protein metabolism, not elsewhere classified: Secondary | ICD-10-CM

## 2016-09-22 DIAGNOSIS — R21 Rash and other nonspecific skin eruption: Secondary | ICD-10-CM

## 2016-09-22 DIAGNOSIS — D72820 Lymphocytosis (symptomatic): Secondary | ICD-10-CM

## 2016-09-22 DIAGNOSIS — R531 Weakness: Secondary | ICD-10-CM

## 2016-09-22 DIAGNOSIS — R634 Abnormal weight loss: Secondary | ICD-10-CM

## 2016-09-22 DIAGNOSIS — R05 Cough: Secondary | ICD-10-CM

## 2016-09-22 DIAGNOSIS — K7689 Other specified diseases of liver: Secondary | ICD-10-CM

## 2016-09-22 DIAGNOSIS — E876 Hypokalemia: Secondary | ICD-10-CM

## 2016-09-22 DIAGNOSIS — R059 Cough, unspecified: Secondary | ICD-10-CM

## 2016-09-22 DIAGNOSIS — R779 Abnormality of plasma protein, unspecified: Secondary | ICD-10-CM

## 2016-09-22 DIAGNOSIS — D649 Anemia, unspecified: Secondary | ICD-10-CM

## 2016-09-22 LAB — THYROID PANEL WITH TSH

## 2016-09-22 LAB — ACETYLCHOLINE RECEPTOR, BINDING

## 2016-09-22 LAB — STRIATED MUSCLE ANTIBODY

## 2016-09-22 NOTE — Telephone Encounter (Signed)
Appointments scheduled per 11/14 LOS. Patient given AVS report and calendars with future scheduled appointments. Patient aware of Scan.

## 2016-09-22 NOTE — Progress Notes (Signed)
[name]    HEMATOLOGY/ONCOLOGY CONSULTATION NOTE  Date of Service: 09/22/2016  Patient Care Team: Janith Lima, MD as PCP - General (Internal Medicine)  CHIEF COMPLAINTS/PURPOSE OF CONSULTATION:  Anemia  HISTORY OF PRESENTING ILLNESS:   Stacey Holland is a wonderful 20 y.o. female who has been referred to [place] by Dr .Scarlette Calico, MD  for evaluation and management of Anemia.  Patient is of [place] descent and has a history of morbid obesity, polycystic ovarian syndrome (on metformin), hypertension, acid reflux  who was referred to [place] by Dr. Scarlette Calico for evaluation of anemia.  Patient notes that she weighed about 320 lbs at her heaviest. She has been on metformin and has been on a diet with smaller portions since March 2017 and has lost more than 120 pounds and notes that she is continuing to lose weight. About a month or so ago she was admitted with dehydration and renal dysfunction which improved with IV fluids. She notes that she has lost most of her desire to eat and gets nauseous with several different types of foods and can only tolerate some of them.  She has had persistent right upper quadrant abdominal pain And severe fatigue. She had a CT of the abdomen done on 09/01/2016 that showed no acute findings. Within the abdomen or pelvis and no evidence of splenomegaly or lymphadenopathy. She has a referral to gastroenterology and she'll be following up to get an EGD.  She complains of severe fatigue and issues with orthostatic hypotension. She has had issues with electrolyte abnormalities with concerns by her primary care physician for an eating disorder.  Patient reports significant amount of fatigue that is affecting her college studies . She reports that she has had what appears to be ML or rash the last few months.   she reports that she had a TB test in 2011 which was negative .  She last was interested [place] about 4 months ago .no fevers or chills no other obvious infections  .  Had a blood transfusion as an infant --primi/breech .  MEDICAL HISTORY:  Past Medical History:  Diagnosis Date  . History of blood transfusion    prematurity, this was during perinatal period  . Hypertension   . Morbid obesity ([place]) 04/03/2016  . PCOS (polycystic ovarian syndrome) 04/03/2016  . Reflux     SURGICAL HISTORY: Past Surgical History:  Procedure Laterality Date  . eustachion tubes      SOCIAL HISTORY: Social History   Social History  . Marital status: Single    Spouse name: N/A  . Number of children: N/A  . Years of education: N/A   Occupational History  . Not on file.   Social History Main Topics  . Smoking status: Never Smoker  . Smokeless tobacco: Never Used  . Alcohol use No  . Drug use: No  . Sexual activity: No   Other Topics Concern  . Not on file   Social History Narrative   Studying mass communications at [employer]  No alcohol use No drug use  FAMILY HISTORY: Family History  Problem Relation Age of Onset  . GER disease Mother   . Hypertension Mother   . GER disease Father   . Hypertension Father   . Lactose intolerance Brother   . Hypertension Maternal Grandfather   . Cancer Neg Hx   . Depression Neg Hx   . Drug abuse Neg Hx   . Early death Neg Hx   . Heart  disease Neg Hx   . Hyperlipidemia Neg Hx   . Kidney disease Neg Hx   . Alcohol abuse Neg Hx   . Asthma Neg Hx   . Stroke Neg Hx   Fraternal twin has possible autoimmune condition Mother -rheumatoid arthritis    ALLERGIES:  is allergic to pineapple and other.  MEDICATIONS:  Current Outpatient Prescriptions  Medication Sig Dispense Refill  . acetaminophen (TYLENOL) 500 MG tablet Take 1,000 mg by mouth every 6 (six) hours as needed for moderate pain.    [name] HYDROcodone-homatropine (HYCODAN) 5-1.5 MG/5ML syrup Take 5 mLs by mouth every 8 (eight) hours as needed for cough. 120 mL 0  . metoCLOPramide (REGLAN) 5 MG tablet Take 1 tablet (5 mg total) by mouth 3 (three)  times daily before meals. 90 tablet 0  . Multiple Vitamin (MULTIVITAMIN WITH MINERALS) TABS tablet Take 1 tablet by mouth daily.    . Norgestimate-Ethinyl Estradiol Triphasic (TRI-PREVIFEM) 0.18/0.215/0.25 MG-35 MCG tablet Take 1 tablet by mouth at bedtime.    [name] omeprazole (PRILOSEC) 20 MG capsule Take 20 mg by mouth at bedtime.    . potassium chloride (KLOR-CON) 20 MEQ packet Take 40 mEq by mouth daily. 30 packet 0   Current Facility-Administered Medications  Medication Dose Route Frequency Provider Last Rate Last Dose  . potassium chloride 20 MEQ/15ML (10%) solution 40 mEq  40 mEq Oral Once Brunetta Genera, MD        REVIEW OF SYSTEMS:    10 Point review of Systems was done is negative except as noted above.  PHYSICAL EXAMINATION: ECOG PERFORMANCE STATUS: 2 - Symptomatic, <50% confined to bed  . Vitals:   09/22/16 1421  BP: 123/90  Pulse: 95  Resp: 17  Temp: 98.6 F (37 C)   Filed Weights   09/22/16 1421  Weight: 179 lb 1.6 oz (81.2 kg)   .Body mass index is 30.74 kg/m.  GENERAL:alert, in no acute distress and comfortable SKIN: skin color, texture, turgor are normal, no rashes or significant lesions EYES: normal, conjunctiva are pink and non-injected, sclera clear OROPHARYNX:no exudate, no erythema and lips, buccal mucosa, and tongue normal  NECK: supple, no JVD, thyroid normal size, non-tender, without nodularity LYMPH:  no palpable lymphadenopathy in the cervical, axillary or inguinal LUNGS: clear to auscultation with normal respiratory effort HEART: regular rate & rhythm,  no murmurs and no lower extremity edema ABDOMEN: abdomen soft, non-tender, normoactive bowel sounds  Musculoskeletal: no cyanosis of digits and no clubbing  PSYCH: alert & oriented x 3 with fluent speech NEURO: no focal motor/sensory deficits.  LABORATORY DATA:  I have reviewed the data as listed  Component     Latest Ref Rng & Units 09/22/2016 09/23/2016 09/23/2016 09/23/2016           3:43 PM  3:43 PM  3:44 PM  WBC     4.0 - 10.5 K/uL   5.8   NEUT#     1.5 - 6.5 10e3/uL   1.2 (L)   Hemoglobin     12.0 - 15.0 g/dL   12.7   HCT     36.0 - 46.0 %   37.0   Platelets     150 - 400 K/uL   339   MCV     78.0 - 100.0 fL   76.3 (L)   MCH     26.0 - 34.0 pg   26.2   MCHC     30.0 - 36.0 g/dL   34.3  RBC     3.87 - 5.11 MIL/uL   4.85   RDW     11.5 - 15.5 %   13.0   lymph#     0.9 - 3.3 10e3/uL   3.8 (H)   MONO#     0.1 - 0.9 10e3/uL   0.7   Eosinophils Absolute     0.0 - 0.5 10e3/uL   0.1   Basophils Absolute     0.0 - 0.1 10e3/uL   0.0   NEUT%     38.4 - 76.8 %   20.5 (L)   LYMPH%     14.0 - 49.7 %   66.0 (H)   MONO%     0.0 - 14.0 %   11.8   EOS%     0.0 - 7.0 %   1.2   BASO%     0.0 - 2.0 %   0.5   nRBC     0 - 0 %   0   Retic %     0.70 - 2.10 %   1.12   Retic Ct Abs     33.70 - 90.70 10e3/uL   54.32   Immature Retic Fract     1.60 - 10.00 %   1.50 (L)   Sodium     135 - 145 mmol/L      Potassium     3.5 - 5.1 mmol/L      Chloride     101 - 111 mmol/L      CO2     22 - 32 mmol/L      Glucose     65 - 99 mg/dL      BUN     6 - 20 mg/dL      Creatinine     0.44 - 1.00 mg/dL      Total Bilirubin     0.20 - 1.20 mg/dL      Alkaline Phosphatase     40 - 150 U/L      AST     5 - 34 U/L      ALT     0 - 55 U/L      Total Protein     6.4 - 8.3 g/dL      Albumin     3.5 - 5.0 g/dL      Calcium     8.9 - 10.3 mg/dL      Anion gap     5 - 15      EGFR     >90 ml/min/1.73 m2      IgG (Immunoglobin G), Serum     700 - 1600 mg/dL      IgA/Immunoglobulin A, Serum     87 - 352 mg/dL      IgM, Qn, Serum     26 - 217 mg/dL      Total Protein     6.0 - 8.5 g/dL      Albumin SerPl Elph-Mcnc     2.9 - 4.4 g/dL      Alpha 1     0.0 - 0.4 g/dL      Alpha2 Glob SerPl Elph-Mcnc     0.4 - 1.0 g/dL      B-Globulin SerPl Elph-Mcnc     0.7 - 1.3 g/dL      Gamma Glob SerPl Elph-Mcnc     0.4 - 1.8 g/dL      M Protein SerPl Elph-Mcnc  Not Observed g/dL      Globulin, Total     2.2 - 3.9 g/dL      Albumin/Glob SerPl     0.7 - 1.7      IFE 1           Please Note (HCV):           EGFR (Non-African Amer.)     >60 mL/min      EGFR (African American)     >60 mL/min      Carnitine, Total     27 - 73 umol/L  56    Carnitine, Free     20 - 55 umol/L  23    Carnitine, Esterfied/Free     0.0 - 0.9 Ratio  1.4 (H)    DISCLAIMER       Comment    Ig Kappa Free Light Chain     3.3 - 19.4 mg/L      Ig Lambda Free Light Chain     5.7 - 26.3 mg/L      Kappa/Lambda FluidC Ratio     0.26 - 1.65      PTH     15 - 65 pg/mL      SSA (Ro) (ENA) Antibody, IgG     <1.0 NEG AI    <1.0 NEG  SSB (La) (ENA) Antibody, IgG     <1.0 NEG AI    <1.0 NEG  Complement C3, Serum     82 - 167 mg/dL      Complement C4, Serum     14 - 44 mg/dL      RA Latex Turbid.     0.0 - 13.9 IU/mL      CCP Antibodies IgG/IgA     0 - 19 units      ENA RNP Ab     0.0 - 0.9 AI  <0.2    Smith Antibodies     0.0 - 0.9 AI  <0.2    Ammonia, Plasma     19 - 87 ug/dL      Hep C Virus Ab     0.0 - 0.9 s/co ratio      HIV     Non Reactive      ENA SM Ab Ser-aCnc     <1.0 NEG AI      Copper     72 - 166 ug/dL 189 (H)     Zinc     56 - 134 ug/dL 82     LDH     125 - 245 U/L   142   Sed Rate     0 - 32 mm/hr      CRP     0.0 - 4.9 mg/L      ANA Ab, IFA           dsDNA Ab     0 - 9 IU/mL      Hemochromatosis Gene           Vitamin D, 25-Hydroxy     30.0 - 100.0 ng/mL      Angio Convert Enzyme     14 - 82 U/L      Magnesium     1.7 - 2.4 mg/dL       Component     Latest Ref Rng & Units 09/23/2016 09/23/2016 09/23/2016         3:45 PM  3:45 PM  3:45 PM  WBC     4.0 -  10.5 K/uL     NEUT#     1.5 - 6.5 10e3/uL     Hemoglobin     12.0 - 15.0 g/dL     HCT     36.0 - 46.0 %     Platelets     150 - 400 K/uL     MCV     78.0 - 100.0 fL     MCH     26.0 - 34.0 pg     MCHC     30.0 - 36.0 g/dL     RBC     3.87 - 5.11 MIL/uL       RDW     11.5 - 15.5 %     lymph#     0.9 - 3.3 10e3/uL     MONO#     0.1 - 0.9 10e3/uL     Eosinophils Absolute     0.0 - 0.5 10e3/uL     Basophils Absolute     0.0 - 0.1 10e3/uL     NEUT%     38.4 - 76.8 %     LYMPH%     14.0 - 49.7 %     MONO%     0.0 - 14.0 %     EOS%     0.0 - 7.0 %     BASO%     0.0 - 2.0 %     nRBC     0 - 0 %     Retic %     0.70 - 2.10 %     Retic Ct Abs     33.70 - 90.70 10e3/uL     Immature Retic Fract     1.60 - 10.00 %     Sodium     135 - 145 mmol/L     Potassium     3.5 - 5.1 mmol/L     Chloride     101 - 111 mmol/L     CO2     22 - 32 mmol/L     Glucose     65 - 99 mg/dL     BUN     6 - 20 mg/dL     Creatinine     0.44 - 1.00 mg/dL     Total Bilirubin     0.20 - 1.20 mg/dL     Alkaline Phosphatase     40 - 150 U/L     AST     5 - 34 U/L     ALT     0 - 55 U/L     Total Protein     6.4 - 8.3 g/dL     Albumin     3.5 - 5.0 g/dL     Calcium     8.9 - 10.3 mg/dL     Anion gap     5 - 15     EGFR     >90 ml/min/1.73 m2     IgG (Immunoglobin G), Serum     700 - 1600 mg/dL 1,402    IgA/Immunoglobulin A, Serum     87 - 352 mg/dL 702 (H)    IgM, Qn, Serum     26 - 217 mg/dL 168    Total Protein     6.0 - 8.5 g/dL 7.9    Albumin SerPl Elph-Mcnc     2.9 - 4.4 g/dL 3.7    Alpha 1     0.0 - 0.4 g/dL 0.3  Alpha2 Glob SerPl Elph-Mcnc     0.4 - 1.0 g/dL 1.0    B-Globulin SerPl Elph-Mcnc     0.7 - 1.3 g/dL 1.5 (H)    Gamma Glob SerPl Elph-Mcnc     0.4 - 1.8 g/dL 1.4    M Protein SerPl Elph-Mcnc     Not Observed g/dL Not Observed    Globulin, Total     2.2 - 3.9 g/dL 4.2 (H)    Albumin/Glob SerPl     0.7 - 1.7 0.9    IFE 1      Comment    Please Note (HCV):      Comment    EGFR (Non-African Amer.)     >60 mL/min     EGFR (African American)     >60 mL/min     Carnitine, Total     27 - 73 umol/L     Carnitine, Free     20 - 55 umol/L     Carnitine, Esterfied/Free     0.0 - 0.9 Ratio     DISCLAIMER           Ig Kappa Free Light Chain     3.3 - 19.4 mg/L 28.6 (H)    Ig Lambda Free Light Chain     5.7 - 26.3 mg/L 19.5    Kappa/Lambda FluidC Ratio     0.26 - 1.65 1.47    PTH     15 - 65 pg/mL     SSA (Ro) (ENA) Antibody, IgG     <1.0 NEG AI     SSB (La) (ENA) Antibody, IgG     <1.0 NEG AI     Complement C3, Serum     82 - 167 mg/dL 154    Complement C4, Serum     14 - 44 mg/dL 36    RA Latex Turbid.     0.0 - 13.9 IU/mL   11.2  CCP Antibodies IgG/IgA     0 - 19 units   8  ENA RNP Ab     0.0 - 0.9 AI     Smith Antibodies     0.0 - 0.9 AI     Ammonia, Plasma     19 - 87 ug/dL 53    Hep C Virus Ab     0.0 - 0.9 s/co ratio 0.1    HIV     Non Reactive Non Reactive    ENA SM Ab Ser-aCnc     <1.0 NEG AI  <1.0 NEG   Copper     72 - 166 ug/dL     Zinc     56 - 134 ug/dL     LDH     125 - 245 U/L     Sed Rate     0 - 32 mm/hr   83 (H)  CRP     0.0 - 4.9 mg/L   50.1 (H)  ANA Ab, IFA        Negative  dsDNA Ab     0 - 9 IU/mL   1  Hemochromatosis Gene        Comment  Vitamin D, 25-Hydroxy     30.0 - 100.0 ng/mL   64.6  Angio Convert Enzyme     14 - 82 U/L   38  Magnesium     1.7 - 2.4 mg/dL      Component     Latest Ref Rng & Units 09/23/2016 09/23/2016 09/23/2016 09/25/2016  3:45 PM  3:45 PM  3:45 PM   WBC     4.0 - 10.5 K/uL      NEUT#     1.5 - 6.5 10e3/uL      Hemoglobin     12.0 - 15.0 g/dL      HCT     36.0 - 46.0 %      Platelets     150 - 400 K/uL      MCV     78.0 - 100.0 fL      MCH     26.0 - 34.0 pg      MCHC     30.0 - 36.0 g/dL      RBC     3.87 - 5.11 MIL/uL      RDW     11.5 - 15.5 %      lymph#     0.9 - 3.3 10e3/uL      MONO#     0.1 - 0.9 10e3/uL      Eosinophils Absolute     0.0 - 0.5 10e3/uL      Basophils Absolute     0.0 - 0.1 10e3/uL      NEUT%     38.4 - 76.8 %      LYMPH%     14.0 - 49.7 %      MONO%     0.0 - 14.0 %      EOS%     0.0 - 7.0 %      BASO%     0.0 - 2.0 %      nRBC     0 - 0 %      Retic  %     0.70 - 2.10 %      Retic Ct Abs     33.70 - 90.70 10e3/uL      Immature Retic Fract     1.60 - 10.00 %      Sodium     135 - 145 mmol/L   134 (L)   Potassium     3.5 - 5.1 mmol/L   2.6 (LL) 3.0 (L)  Chloride     101 - 111 mmol/L   89 (L)   CO2     22 - 32 mmol/L   27   Glucose     65 - 99 mg/dL   78   BUN     6 - 20 mg/dL   17.0   Creatinine     0.44 - 1.00 mg/dL   1.1   Total Bilirubin     0.20 - 1.20 mg/dL   0.82   Alkaline Phosphatase     40 - 150 U/L   66   AST     5 - 34 U/L   29   ALT     0 - 55 U/L   44   Total Protein     6.4 - 8.3 g/dL   8.5 (H)   Albumin     3.5 - 5.0 g/dL   3.5   Calcium     8.9 - 10.3 mg/dL 10.3 (H)  10.5 (H)   Anion gap     5 - 15   18 (H)   EGFR     >90 ml/min/1.73 m2   76 (L)   IgG (Immunoglobin G), Serum     700 - 1600 mg/dL      IgA/Immunoglobulin A, Serum     87 -  352 mg/dL      IgM, Qn, Serum     26 - 217 mg/dL      Total Protein     6.0 - 8.5 g/dL      Albumin SerPl Elph-Mcnc     2.9 - 4.4 g/dL      Alpha 1     0.0 - 0.4 g/dL      Alpha2 Glob SerPl Elph-Mcnc     0.4 - 1.0 g/dL      B-Globulin SerPl Elph-Mcnc     0.7 - 1.3 g/dL      Gamma Glob SerPl Elph-Mcnc     0.4 - 1.8 g/dL      M Protein SerPl Elph-Mcnc     Not Observed g/dL      Globulin, Total     2.2 - 3.9 g/dL      Albumin/Glob SerPl     0.7 - 1.7      IFE 1           Please Note (HCV):           EGFR (Non-African Amer.)     >60 mL/min      EGFR (African American)     >60 mL/min      Carnitine, Total     27 - 73 umol/L      Carnitine, Free     20 - 55 umol/L      Carnitine, Esterfied/Free     0.0 - 0.9 Ratio      DISCLAIMER           Ig Kappa Free Light Chain     3.3 - 19.4 mg/L      Ig Lambda Free Light Chain     5.7 - 26.3 mg/L      Kappa/Lambda FluidC Ratio     0.26 - 1.65      PTH     15 - 65 pg/mL 19 Comment    SSA (Ro) (ENA) Antibody, IgG     <1.0 NEG AI      SSB (La) (ENA) Antibody, IgG     <1.0 NEG AI      Complement  C3, Serum     82 - 167 mg/dL      Complement C4, Serum     14 - 44 mg/dL      RA Latex Turbid.     0.0 - 13.9 IU/mL      CCP Antibodies IgG/IgA     0 - 19 units      ENA RNP Ab     0.0 - 0.9 AI      Smith Antibodies     0.0 - 0.9 AI      Ammonia, Plasma     19 - 87 ug/dL      Hep C Virus Ab     0.0 - 0.9 s/co ratio      HIV     Non Reactive      ENA SM Ab Ser-aCnc     <1.0 NEG AI      Copper     72 - 166 ug/dL      Zinc     56 - 134 ug/dL      LDH     125 - 245 U/L      Sed Rate     0 - 32 mm/hr      CRP     0.0 - 4.9 mg/L  ANA Ab, IFA           dsDNA Ab     0 - 9 IU/mL      Hemochromatosis Gene           Vitamin D, 25-Hydroxy     30.0 - 100.0 ng/mL      Angio Convert Enzyme     14 - 82 U/L      Magnesium     1.7 - 2.4 mg/dL    1.4 (L)   Component     Latest Ref Rng & Units 09/26/2016          WBC     4.0 - 10.5 K/uL 4.7  NEUT#     1.5 - 6.5 10e3/uL   Hemoglobin     12.0 - 15.0 g/dL 57.9 (L)  HCT     23.8 - 46.0 % 33.8 (L)  Platelets     150 - 400 K/uL 259  MCV     78.0 - 100.0 fL 79.7  MCH     26.0 - 34.0 pg 25.9 (L)  MCHC     30.0 - 36.0 g/dL 25.0  RBC     9.28 - 8.01 MIL/uL 4.24  RDW     11.5 - 15.5 % 13.5  lymph#     0.9 - 3.3 10e3/uL   MONO#     0.1 - 0.9 10e3/uL   Eosinophils Absolute     0.0 - 0.5 10e3/uL   Basophils Absolute     0.0 - 0.1 10e3/uL   NEUT%     38.4 - 76.8 %   LYMPH%     14.0 - 49.7 %   MONO%     0.0 - 14.0 %   EOS%     0.0 - 7.0 %   BASO%     0.0 - 2.0 %   nRBC     0 - 0 %   Retic %     0.70 - 2.10 %   Retic Ct Abs     33.70 - 90.70 10e3/uL   Immature Retic Fract     1.60 - 10.00 %   Sodium     135 - 145 mmol/L 138  Potassium     3.5 - 5.1 mmol/L 3.5  Chloride     101 - 111 mmol/L 101  CO2     22 - 32 mmol/L 29  Glucose     65 - 99 mg/dL 96  BUN     6 - 20 mg/dL 12  Creatinine     3.27 - 1.00 mg/dL 2.05 (H)  Total Bilirubin     0.20 - 1.20 mg/dL   Alkaline Phosphatase     40 - 150  U/L   AST     5 - 34 U/L   ALT     0 - 55 U/L   Total Protein     6.4 - 8.3 g/dL   Albumin     3.5 - 5.0 g/dL   Calcium     8.9 - 08.4 mg/dL 8.8 (L)  Anion gap     5 - 15 8  EGFR     >90 ml/min/1.73 m2   IgG (Immunoglobin G), Serum     700 - 1600 mg/dL   IgA/Immunoglobulin A, Serum     87 - 352 mg/dL   IgM, Qn, Serum     26 - 217 mg/dL   Total Protein     6.0 -  8.5 g/dL   Albumin SerPl Elph-Mcnc     2.9 - 4.4 g/dL   Alpha 1     0.0 - 0.4 g/dL   Alpha2 Glob SerPl Elph-Mcnc     0.4 - 1.0 g/dL   B-Globulin SerPl Elph-Mcnc     0.7 - 1.3 g/dL   Gamma Glob SerPl Elph-Mcnc     0.4 - 1.8 g/dL   M Protein SerPl Elph-Mcnc     Not Observed g/dL   Globulin, Total     2.2 - 3.9 g/dL   Albumin/Glob SerPl     0.7 - 1.7   IFE 1        Please Note (HCV):        EGFR (Non-African Amer.)     >60 mL/min >60  EGFR (African American)     >60 mL/min >60  Carnitine, Total     27 - 73 umol/L   Carnitine, Free     20 - 55 umol/L   Carnitine, Esterfied/Free     0.0 - 0.9 Ratio   DISCLAIMER        Ig Kappa Free Light Chain     3.3 - 19.4 mg/L   Ig Lambda Free Light Chain     5.7 - 26.3 mg/L   Kappa/Lambda FluidC Ratio     0.26 - 1.65   PTH     15 - 65 pg/mL   SSA (Ro) (ENA) Antibody, IgG     <1.0 NEG AI   SSB (La) (ENA) Antibody, IgG     <1.0 NEG AI   Complement C3, Serum     82 - 167 mg/dL   Complement C4, Serum     14 - 44 mg/dL   RA Latex Turbid.     0.0 - 13.9 IU/mL   CCP Antibodies IgG/IgA     0 - 19 units   ENA RNP Ab     0.0 - 0.9 AI   Smith Antibodies     0.0 - 0.9 AI   Ammonia, Plasma     19 - 87 ug/dL   Hep C Virus Ab     0.0 - 0.9 s/co ratio   HIV     Non Reactive   ENA SM Ab Ser-aCnc     <1.0 NEG AI   Copper     72 - 166 ug/dL   Zinc     56 - 134 ug/dL   LDH     125 - 245 U/L   Sed Rate     0 - 32 mm/hr   CRP     0.0 - 4.9 mg/L   ANA Ab, IFA        dsDNA Ab     0 - 9 IU/mL   Hemochromatosis Gene        Vitamin D, 25-Hydroxy      30.0 - 100.0 ng/mL   Angio Convert Enzyme     14 - 82 U/L   Magnesium     1.7 - 2.4 mg/dL 2.1     Component     Latest Ref Rng & Units 07/21/2016 08/25/2016         4:17 PM   WBC     4.5 - 10.5 K/uL 8.8 6.1  RBC     3.87 - 5.11 Mil/uL 4.56 5.42 (H)  Hemoglobin     12.0 - 15.0 g/dL 11.8 (L) 14.1  HCT     36.0 - 46.0 % 36.3 41.6  MCV     78.0 - 100.0 fl 79.6 76.8 (L)  MCH     26.0 - 34.0 pg 25.9 (L)   MCHC     30.0 - 36.0 g/dL 32.5 34.0  RDW     11.5 - 14.6 % 13.1 12.8  Platelets     150.0 - 400.0 K/uL 286 291.0  Neutrophils     43.0 - 77.0 % 41 21.5 (L)  NEUT#     1.4 - 7.7 K/uL 3.6 1.3 (L)  Lymphocytes     12.0 - 46.0 % 51 63.3 Repeated and verified X2. (H)  Lymphocyte #     0.7 - 4.0 K/uL 4.5 (H) 3.8  Monocytes Relative     3.0 - 12.0 % 7 12.9 (H)  Monocyte #     0.1 - 1.0 K/uL 0.6 0.8  Eosinophil     0.0 - 5.0 % 1 1.3  Eosinophils Absolute     0.0 - 0.7 K/uL 0.1 0.1  Basophil     0.0 - 3.0 % 0 1.0  Basophils Absolute     0.0 - 0.1 K/uL 0.0 0.1  Sodium     135 - 145 mEq/L    Potassium     3.5 - 5.1 mEq/L    Chloride     96 - 112 mEq/L    CO2     19 - 32 mEq/L    Glucose     70 - 99 mg/dL    BUN     6 - 23 mg/dL    Creatinine     0.40 - 1.20 mg/dL    Total Bilirubin     0.2 - 1.2 mg/dL    Alkaline Phosphatase     39 - 117 U/L    AST     0 - 37 U/L    ALT     0 - 35 U/L    Total Protein     6.0 - 8.3 g/dL    Albumin     3.5 - 5.2 g/dL    Calcium     8.4 - 10.5 mg/dL    GFR     >60.00 mL/min    Iron     42 - 145 ug/dL  108  Transferrin     212.0 - 360.0 mg/dL  264.0  Saturation Ratios     20.0 - 50.0 %  29.2  Retic Ct Pct     %  0.9  RBC.     3.80 - 5.10 MIL/uL  5.30 (H)  ABS Retic     20,000 - 80,000 cells/uL  47,700  TSH     0.35 - 5.50 uIU/mL    Vitamin B12     211 - 911 pg/mL  1,311 (H)  Lipase     11 - 51 U/L 35   H. pylori, IgG Abs     Negative    Folate     >5.9 ng/mL  >23.2  Ferritin     10.0 - 291.0 ng/mL   578.5 (H)  Methylmalonic Acid, Quant     87 - 318 nmol/L  155  Cortisol, Plasma     ug/dL    A CHR BINDING ABS     <=0.30 nmol/L    C206 ACTH     6 - 50 pg/mL    Quantitative HCG     mIU/ml     Component     Latest Ref Rng & Units 09/17/2016  WBC     4.5 - 10.5 K/uL 6.8  RBC     3.87 - 5.11 Mil/uL 5.24 (H)  Hemoglobin     12.0 - 15.0 g/dL 13.6  HCT     36.0 - 46.0 % 40.3  MCV     78.0 - 100.0 fl 76.7 (L)  MCH     26.0 - 34.0 pg   MCHC     30.0 - 36.0 g/dL 33.7  RDW     11.5 - 14.6 % 12.9  Platelets     150.0 - 400.0 K/uL 347.0  Neutrophils     43.0 - 77.0 % 26.8 (L)  NEUT#     1.4 - 7.7 K/uL 1.8  Lymphocytes     12.0 - 46.0 % 60.0 Repeated and verified X2. (H)  Lymphocyte #     0.7 - 4.0 K/uL 4.1 (H)  Monocytes Relative     3.0 - 12.0 % 10.0  Monocyte #     0.1 - 1.0 K/uL 0.7  Eosinophil     0.0 - 5.0 % 2.0  Eosinophils Absolute     0.0 - 0.7 K/uL 0.1  Basophil     0.0 - 3.0 % 1.2  Basophils Absolute     0.0 - 0.1 K/uL 0.1  Sodium     135 - 145 mEq/L 137  Potassium     3.5 - 5.1 mEq/L 3.0 (L)  Chloride     96 - 112 mEq/L 90 (L)  CO2     19 - 32 mEq/L 30  Glucose     70 - 99 mg/dL 75  BUN     6 - 23 mg/dL 17  Creatinine     0.40 - 1.20 mg/dL 1.14  Total Bilirubin     0.2 - 1.2 mg/dL 0.6  Alkaline Phosphatase     39 - 117 U/L 64  AST     0 - 37 U/L 48 (H)  ALT     0 - 35 U/L 71 (H)  Total Protein     6.0 - 8.3 g/dL 8.6 (H)  Albumin     3.5 - 5.2 g/dL 4.1  Calcium     8.4 - 10.5 mg/dL 10.6 (H)  GFR     >60.00 mL/min 64.08  Iron     42 - 145 ug/dL   Transferrin     212.0 - 360.0 mg/dL   Saturation Ratios     20.0 - 50.0 %   Retic Ct Pct     %   RBC.     3.80 - 5.10 MIL/uL   ABS Retic     20,000 - 80,000 cells/uL   TSH     0.35 - 5.50 uIU/mL   Vitamin B12     211 - 911 pg/mL   Lipase     11 - 51 U/L   H. pylori, IgG Abs     Negative   Folate     >5.9 ng/mL   Ferritin     10.0 - 291.0 ng/mL   Methylmalonic  Acid, Quant     87 - 318 nmol/L   Cortisol, Plasma     ug/dL 8.8  A CHR BINDING ABS     <=0.30 nmol/L <0.30  C206 ACTH     6 - 50 pg/mL 9  Quantitative HCG     mIU/ml 0.12   RADIOGRAPHIC STUDIES: I have personally reviewed the radiological images as listed and agreed with the findings  in the report. Dg Chest 2 View  Result Date: 09/18/2016 CLINICAL DATA:  Five days of nonproductive cough. History of morbid obesity, gastroesophageal reflux. EXAM: CHEST  2 VIEW COMPARISON:  PA and lateral chest x-ray of June 27, 2010 FINDINGS: The lungs are well-expanded and clear. The heart and pulmonary vascularity are normal. The mediastinum is normal in width. There is no pleural effusion. The bony thorax exhibits no acute abnormality. IMPRESSION: There is no active cardiopulmonary disease. Electronically Signed   By: David  Martinique M.D.   On: 09/18/2016 07:09   Ct Chest Wo Contrast  Result Date: 09/30/2016 CLINICAL DATA:  Cough with weight loss, fatigue and elevated hemoglobin levels. No recent surgery or history of malignancy. EXAM: CT CHEST WITHOUT CONTRAST TECHNIQUE: Multidetector CT imaging of the chest was performed following the standard protocol without IV contrast. COMPARISON:  Chest radiographs 09/17/2016. Abdominal pelvic CT 09/01/2016. FINDINGS: Cardiovascular: No significant vascular findings are seen on noncontrast imaging. The heart size is normal. There is no pericardial effusion. Mediastinum/Nodes: There are no enlarged mediastinal, hilar or axillary lymph nodes.There is an 8 mm left supraclavicular node on image 14. Hilar assessment is limited by the lack of intravenous contrast, although the hilar contours appear unchanged. The thyroid gland, trachea and esophagus demonstrate no significant findings. There is small amount of residual thymic tissue in anterior mediastinal, typical for age. Lungs/Pleura: There is no pleural effusion. The lungs are clear. Upper abdomen:  Unremarkable.  There  is no adrenal mass. Musculoskeletal/Chest wall: There is no chest wall mass or suspicious osseous finding. IMPRESSION: No acute or significant findings demonstrated. Electronically Signed   By: Richardean Sale M.D.   On: 09/30/2016 12:46   CT ABDOMEN PELVIS W CONTRAST (Accession [id number]) (Order [id number])  Imaging  Date: 09/01/2016 Department: [facility] Released By: Doretha Sou, NT Authorizing: Janith Lima, MD  Exam Information   Status Exam Begun  Exam Ended   Final [99] 09/01/2016 2:30 PM 09/01/2016 2:39 PM  PACS Images   Show images for CT ABDOMEN PELVIS W CONTRAST  Study Result   CLINICAL DATA:  Atypical lymphocytosis. Left upper quadrant pain for several months.  EXAM: CT ABDOMEN AND PELVIS WITH CONTRAST  TECHNIQUE: Multidetector CT imaging of the abdomen and pelvis was performed using the standard protocol following bolus administration of intravenous contrast.  CONTRAST:  161m ISOVUE-300 IOPAMIDOL (ISOVUE-300) INJECTION 61%  COMPARISON:  None.  FINDINGS: Lower Chest: No acute findings.  Hepatobiliary: No mass identified. Focal fatty infiltration adjacent to falciform ligament. Gallbladder is unremarkable.  Pancreas:  No mass or inflammatory changes.  Spleen: Within normal limits in size and appearance.  Adrenals/Urinary Tract: No masses identified. No evidence of hydronephrosis.  Stomach/Bowel: No evidence of obstruction, inflammatory process or abnormal fluid collections. Normal appendix visualized.  Vascular/Lymphatic: No pathologically enlarged lymph nodes. No abdominal aortic aneurysm.  Reproductive:  No mass identified.  Other:  None.  Musculoskeletal:  No suspicious bone lesions identified.  IMPRESSION: No acute findings within the abdomen or pelvis. No evidence of splenomegaly or lymphadenopathy.   Electronically Signed   By: JEarle GellM.D.   On: 09/01/2016 17:05      ASSESSMENT & PLAN:   [age][place]   1) Microcytic Anemia - ferritin levels are elevated to 500's.  Appears to be anemia of chronic disease with significantly elevated inflammatory markers due to unclear etiology. B12 levels within normal limits No evidence of hemolysis with normal LDH and  bilirubin Could be due to rapid weight loss from 320 pounds down to 180lbs. this can result from several things including severe protein calorie malnutrition which of severe can cause gelatinous transformation of the bone marrow. Patient does not have any other significant cytopenias at this time. Cough and zinc levels within normal limits  CT chest abdomen pelvis - no evidence of hepatosplenomegaly or lymphadenopathy in the chest or abdomen. Normal LDH level suggests against a lymphoproliferative disorder. HIV negative Hepatitis C negative 2) lymphocytosis in peripheral blood   -Flow cytometry showed no evidence of a monoclonal B cell positive typically aberrant T-cell population. No evidence of a lymphoproliferative disorder based on absence of lymphadenopathy or hepatosplenomegaly. 3) elevated ferritin level -likely reactive due to inflammation Hemochromatosis panel negative for mutation 3) Elevated sedimentation rate and CRP- no overt evidence of malignancy on CT chest abdomen pelvis.  no overt focal symptoms suggesting specific infection . Given her significant fatigue - concern for possible autoimmune condition. Mother has a history of rheumatoid arthritis. Component     Latest Ref Rng & Units 09/23/2016  SSA (Ro) (ENA) Antibody, IgG     <1.0 NEG AI <1.0 NEG  SSB (La) (ENA) Antibody, IgG     <1.0 NEG AI <1.0 NEG  Complement C3, Serum     82 - 167 mg/dL 154  Complement C4, Serum     14 - 44 mg/dL 36  RA Latex Turbid.     0.0 - 13.9 IU/mL 11.2  CCP Antibodies IgG/IgA     0 - 19 units 8  ENA SM Ab Ser-aCnc     <1.0 NEG AI <1.0 NEG  Sed Rate     0 - 32  mm/hr 83 (H)  CRP     0.0 - 4.9 mg/L 50.1 (H)  ANA Ab, IFA      Negative  dsDNA Ab     0 - 9 IU/mL 1   Plan -Recommended to the patient that she take a multivitamin daily to address any unknown nutritional deficiencies. -She needs to stop losing weight at this time and stabilizing her weight and nutritional status. -I offered her a referral to our dietitian to try to optimize nutrition. -Tried to discuss with her if she were depressed or there was a concern of an eating disorder -she does not feel that is the case currently. She discussed this in the presence of her parents who are very supportive.  -Would strongly recommend having a rheumatology evaluation to rule out the possibility of autoimmune conditions/vasculitis. -get Tuberculin testing/Quantiferon testing with PCP  4) hypercalcemia - could be from mild dehydration PTH level within normal limits Normal vitamin D level ACE levels wnl making sarcoidosis less likely. No overt evidence of lymphoma. Component     Latest Ref Rng & Units 09/23/2016 09/23/2016 09/23/2016         3:45 PM  3:45 PM  3:45 PM  Calcium     8.7 - 10.2 mg/dL  10.3 (H)   PTH     15 - 65 pg/mL  19 Comment  Vitamin D, 25-Hydroxy     30.0 - 100.0 ng/mL 64.6    Angio Convert Enzyme     14 - 82 U/L 38     PLAN -will need rpt labs on followup. If persistent might need a bone scan.  Return to clinic with Dr. Irene Limbo in 2 weeks with repeat labs and to follow-up on lab results.   Orders Placed This Encounter  Procedures  .  CT CHEST WO CONTRAST    Standing Status:   Future    Number of Occurrences:   1    Standing Expiration Date:   11/22/2017    Order Specific Question:   Reason for Exam (SYMPTOM  OR DIAGNOSIS REQUIRED)    Answer:   persistent cough and shortness of breath    Order Specific Question:   Is patient pregnant?    Answer:   No    Order Specific Question:   Preferred imaging location?    Answer:   [facility]  . CBC & Diff and Retic     Standing Status:   Future    Number of Occurrences:   1    Standing Expiration Date:   09/22/2017  . Comprehensive metabolic panel    Standing Status:   Future    Number of Occurrences:   1    Standing Expiration Date:   09/22/2017  . Lactate dehydrogenase    Standing Status:   Future    Number of Occurrences:   1    Standing Expiration Date:   09/22/2017  . Sedimentation rate    Standing Status:   Future    Number of Occurrences:   1    Standing Expiration Date:   09/22/2017  . C-reactive protein    Standing Status:   Future    Number of Occurrences:   1    Standing Expiration Date:   09/22/2017  . Flow Cytometry    lymphocytosis    Standing Status:   Future    Number of Occurrences:   1    Standing Expiration Date:   09/22/2017  . ANA, IFA (with reflex)    Standing Status:   Future    Number of Occurrences:   1    Standing Expiration Date:   09/22/2017  . Anti-DNA antibody, double-stranded    Standing Status:   Future    Number of Occurrences:   1    Standing Expiration Date:   09/22/2017  . Hemochromatosis DNA, PCR    Standing Status:   Future    Number of Occurrences:   1    Standing Expiration Date:   09/22/2017  . Sjogren's syndrome antibods(ssa + ssb)    Standing Status:   Future    Number of Occurrences:   1    Standing Expiration Date:   09/22/2017  . RNP Antibodies    Standing Status:   Future    Number of Occurrences:   1    Standing Expiration Date:   09/22/2017  . Anti-Smith antibody    Standing Status:   Future    Number of Occurrences:   1    Standing Expiration Date:   09/22/2017  . C3 and C4    Standing Status:   Future    Number of Occurrences:   1    Standing Expiration Date:   09/22/2017  . Smear    Standing Status:   Future    Number of Occurrences:   1    Standing Expiration Date:   09/22/2017  . VITAMIN D 25 Hydroxy (Vit-D Deficiency, Fractures)    Standing Status:   Future    Number of Occurrences:   1    Standing Expiration Date:    09/22/2017  . PTH, Intact and Calcium    Standing Status:   Future    Number of Occurrences:   1    Standing Expiration Date:   09/22/2017  .  Angiotensin converting enzyme    Standing Status:   Future    Number of Occurrences:   1    Standing Expiration Date:   09/22/2017  . Copper, Serum    Standing Status:   Future    Number of Occurrences:   1    Standing Expiration Date:   09/22/2017  . Zinc    Standing Status:   Future    Number of Occurrences:   1    Standing Expiration Date:   09/22/2017  . Carnitine, LC/MS/MS    Standing Status:   Future    Number of Occurrences:   1    Standing Expiration Date:   09/22/2017  . Ammonia    Standing Status:   Future    Number of Occurrences:   1    Standing Expiration Date:   09/22/2017  . Rheumatoid factor    Standing Status:   Future    Number of Occurrences:   1    Standing Expiration Date:   09/22/2017  . Multiple Myeloma Panel (SPEP&IFE w/QIG)    Standing Status:   Future    Number of Occurrences:   1    Standing Expiration Date:   09/22/2017  . Kappa/lambda light chains    Standing Status:   Future    Number of Occurrences:   1    Standing Expiration Date:   09/22/2017  . Hepatitis C antibody    Standing Status:   Future    Number of Occurrences:   1    Standing Expiration Date:   09/22/2017  . HIV antibody (with reflex)    Standing Status:   Future    Number of Occurrences:   1    Standing Expiration Date:   09/22/2017    All of the patients questions were answered with apparent satisfaction. The patient knows to call the clinic with any problems, questions or concerns.  I spent 85 minutes counseling the patient face to face. The total time spent in the appointment was 110 minutes and more than 50% was on counseling and direct patient cares.    Sullivan Lone MD [place] AAHIVMS [facility] Hematology/Oncology Physician [facility]  (Office):       [phone] (Work cell):  [phone] (Fax):            [phone]  09/22/2016 2:26 PM

## 2016-09-23 ENCOUNTER — Other Ambulatory Visit (HOSPITAL_COMMUNITY)
Admission: RE | Admit: 2016-09-23 | Discharge: 2016-09-23 | Disposition: A | Discharge status: Home / Self Care | Payer: BLUE CROSS/BLUE SHIELD | Source: Ambulatory Visit | Attending: Hematology | Admitting: Hematology

## 2016-09-23 ENCOUNTER — Ambulatory Visit (HOSPITAL_BASED_OUTPATIENT_CLINIC_OR_DEPARTMENT_OTHER): Payer: BLUE CROSS/BLUE SHIELD

## 2016-09-23 ENCOUNTER — Other Ambulatory Visit: Payer: Self-pay | Admitting: *Deleted

## 2016-09-23 ENCOUNTER — Ambulatory Visit: Payer: BLUE CROSS/BLUE SHIELD

## 2016-09-23 DIAGNOSIS — R110 Nausea: Secondary | ICD-10-CM

## 2016-09-23 DIAGNOSIS — E282 Polycystic ovarian syndrome: Secondary | ICD-10-CM

## 2016-09-23 DIAGNOSIS — K7689 Other specified diseases of liver: Secondary | ICD-10-CM | POA: Diagnosis not present

## 2016-09-23 DIAGNOSIS — E876 Hypokalemia: Secondary | ICD-10-CM

## 2016-09-23 DIAGNOSIS — D72820 Lymphocytosis (symptomatic): Secondary | ICD-10-CM | POA: Diagnosis not present

## 2016-09-23 DIAGNOSIS — R1012 Left upper quadrant pain: Secondary | ICD-10-CM | POA: Insufficient documentation

## 2016-09-23 LAB — CBC & DIFF AND RETIC
BASO%: 0.5 % (ref 0.0–2.0)
Basophils Absolute: 0 10*3/uL (ref 0.0–0.1)
EOS ABS: 0.1 10*3/uL (ref 0.0–0.5)
EOS%: 1.2 % (ref 0.0–7.0)
HEMATOCRIT: 37 % (ref 34.8–46.6)
HGB: 12.7 g/dL (ref 11.6–15.9)
Immature Retic Fract: 1.5 % — ABNORMAL LOW (ref 1.60–10.00)
LYMPH%: 66 % — AB (ref 14.0–49.7)
MCH: 26.2 pg (ref 25.1–34.0)
MCHC: 34.3 g/dL (ref 31.5–36.0)
MCV: 76.3 fL — ABNORMAL LOW (ref 79.5–101.0)
MONO#: 0.7 10*3/uL (ref 0.1–0.9)
MONO%: 11.8 % (ref 0.0–14.0)
NEUT#: 1.2 10*3/uL — ABNORMAL LOW (ref 1.5–6.5)
NEUT%: 20.5 % — ABNORMAL LOW (ref 38.4–76.8)
NRBC: 0 % (ref 0–0)
Platelets: 339 10*3/uL (ref 145–400)
RBC: 4.85 10*6/uL (ref 3.70–5.45)
RDW: 13 % (ref 11.2–14.5)
RETIC %: 1.12 % (ref 0.70–2.10)
RETIC CT ABS: 54.32 10*3/uL (ref 33.70–90.70)
WBC: 5.8 10*3/uL (ref 3.9–10.3)
lymph#: 3.8 10*3/uL — ABNORMAL HIGH (ref 0.9–3.3)

## 2016-09-23 LAB — COMPREHENSIVE METABOLIC PANEL
ALBUMIN: 3.5 g/dL (ref 3.5–5.0)
ALK PHOS: 66 U/L (ref 40–150)
ALT: 44 U/L (ref 0–55)
AST: 29 U/L (ref 5–34)
Anion Gap: 18 mEq/L — ABNORMAL HIGH (ref 3–11)
BUN: 17 mg/dL (ref 7.0–26.0)
CALCIUM: 10.5 mg/dL — AB (ref 8.4–10.4)
CO2: 27 mEq/L (ref 22–29)
CREATININE: 1.1 mg/dL (ref 0.6–1.1)
Chloride: 89 mEq/L — ABNORMAL LOW (ref 98–109)
EGFR: 76 mL/min/{1.73_m2} — ABNORMAL LOW (ref >90)
GLUCOSE: 78 mg/dL (ref 70–140)
Potassium: 2.6 mEq/L — CL (ref 3.5–5.1)
Sodium: 134 mEq/L — ABNORMAL LOW (ref 136–145)
Total Bilirubin: 0.82 mg/dL (ref 0.20–1.20)
Total Protein: 8.5 g/dL — ABNORMAL HIGH (ref 6.4–8.3)

## 2016-09-23 LAB — CHCC SMEAR

## 2016-09-23 MED ORDER — SODIUM CHLORIDE 0.9 % IV SOLN
Freq: Once | INTRAVENOUS | Status: AC
  Administered 2016-09-23: 17:00:00 via INTRAVENOUS

## 2016-09-23 MED ORDER — POTASSIUM CHLORIDE 20 MEQ/15ML (10%) PO SOLN
40.0000 meq | Freq: Once | ORAL | Status: DC
  Filled 2016-09-23: qty 30

## 2016-09-23 MED ORDER — LORAZEPAM 1 MG PO TABS
ORAL_TABLET | ORAL | Status: AC
  Filled 2016-09-23: qty 1

## 2016-09-23 MED ORDER — LORAZEPAM 1 MG PO TABS
0.5000 mg | ORAL_TABLET | Freq: Once | ORAL | Status: AC
  Administered 2016-09-23: 0.5 mg via ORAL

## 2016-09-23 MED ORDER — POTASSIUM CHLORIDE 20 MEQ PO PACK: PACK | ORAL | 0 refills | Status: DC

## 2016-09-23 MED ORDER — POTASSIUM CHLORIDE CRYS ER 20 MEQ PO TBCR
40.0000 meq | EXTENDED_RELEASE_TABLET | Freq: Once | ORAL | Status: AC
  Administered 2016-09-23: 40 meq via ORAL

## 2016-09-23 NOTE — Patient Instructions (Signed)
Dehydration, Adult Dehydration is a condition in which there is not enough fluid or water in the body. This happens when you lose more fluids than you take in. Important organs, such as the kidneys, brain, and heart, cannot function without a proper amount of fluids. Any loss of fluids from the body can lead to dehydration. Dehydration can range from mild to severe. This condition should be treated right away to prevent it from becoming severe. What are the causes? This condition may be caused by:  Vomiting.  Diarrhea.  Excessive sweating, such as from heat exposure or exercise.  Not drinking enough fluid, especially:  When ill.  While doing activity that requires a lot of energy.  Excessive urination.  Fever.  Infection.  Certain medicines, such as medicines that cause the body to lose excess fluid (diuretics).  Inability to access safe drinking water.  Reduced physical ability to get adequate water and food. What increases the risk? This condition is more likely to develop in people:  Who have a poorly controlled long-term (chronic) illness, such as diabetes, heart disease, or kidney disease.  Who are age 65 or older.  Who are disabled.  Who live in a place with high altitude.  Who play endurance sports. What are the signs or symptoms? Symptoms of mild dehydration may include:   Thirst.  Dry lips.  Slightly dry mouth.  Dry, warm skin.  Dizziness. Symptoms of moderate dehydration may include:   Very dry mouth.  Muscle cramps.  Dark urine. Urine may be the color of tea.  Decreased urine production.  Decreased tear production.  Heartbeat that is irregular or faster than normal (palpitations).  Headache.  Light-headedness, especially when you stand up from a sitting position.  Fainting (syncope). Symptoms of severe dehydration may include:   Changes in skin, such as:  Cold and clammy skin.  Blotchy (mottled) or pale skin.  Skin that does  not quickly return to normal after being lightly pinched and released (poor skin turgor).  Changes in body fluids, such as:  Extreme thirst.  No tear production.  Inability to sweat when body temperature is high, such as in hot weather.  Very little urine production.  Changes in vital signs, such as:  Weak pulse.  Pulse that is more than 100 beats a minute when sitting still.  Rapid breathing.  Low blood pressure.  Other changes, such as:  Sunken eyes.  Cold hands and feet.  Confusion.  Lack of energy (lethargy).  Difficulty waking up from sleep.  Short-term weight loss.  Unconsciousness. How is this diagnosed? This condition is diagnosed based on your symptoms and a physical exam. Blood and urine tests may be done to help confirm the diagnosis. How is this treated? Treatment for this condition depends on the severity. Mild or moderate dehydration can often be treated at home. Treatment should be started right away. Do not wait until dehydration becomes severe. Severe dehydration is an emergency and it needs to be treated in a hospital. Treatment for mild dehydration may include:   Drinking more fluids.  Replacing salts and minerals in your blood (electrolytes) that you may have lost. Treatment for moderate dehydration may include:   Drinking an oral rehydration solution (ORS). This is a drink that helps you replace fluids and electrolytes (rehydrate). It can be found at pharmacies and retail stores. Treatment for severe dehydration may include:   Receiving fluids through an IV tube.  Receiving an electrolyte solution through a feeding tube that is   passed through your nose and into your stomach (nasogastric tube, or NG tube).  Correcting any abnormalities in electrolytes.  Treating the underlying cause of dehydration. Follow these instructions at home:  If directed by your health care provider, drink an ORS:  Make an ORS by following instructions on the  package.  Start by drinking small amounts, about  cup (120 mL) every 5-10 minutes.  Slowly increase how much you drink until you have taken the amount recommended by your health care provider.  Drink enough clear fluid to keep your urine clear or pale yellow. If you were told to drink an ORS, finish the ORS first, then start slowly drinking other clear fluids. Drink fluids such as:  Water. Do not drink only water. Doing that can lead to having too little salt (sodium) in the body (hyponatremia).  Ice chips.  Fruit juice that you have added water to (diluted fruit juice).  Low-calorie sports drinks.  Avoid:  Alcohol.  Drinks that contain a lot of sugar. These include high-calorie sports drinks, fruit juice that is not diluted, and soda.  Caffeine.  Foods that are greasy or contain a lot of fat or sugar.  Take over-the-counter and prescription medicines only as told by your health care provider.  Do not take sodium tablets. This can lead to having too much sodium in the body (hypernatremia).  Eat foods that contain a healthy balance of electrolytes, such as bananas, oranges, potatoes, tomatoes, and spinach.  Keep all follow-up visits as told by your health care provider. This is important. Contact a health care provider if:  You have abdominal pain that:  Gets worse.  Stays in one area (localizes).  You have a rash.  You have a stiff neck.  You are more irritable than usual.  You are sleepier or more difficult to wake up than usual.  You feel weak or dizzy.  You feel very thirsty.  You have urinated only a small amount of very dark urine over 6-8 hours. Get help right away if:  You have symptoms of severe dehydration.  You cannot drink fluids without vomiting.  Your symptoms get worse with treatment.  You have a fever.  You have a severe headache.  You have vomiting or diarrhea that:  Gets worse.  Does not go away.  You have blood or green matter  (bile) in your vomit.  You have blood in your stool. This may cause stool to look black and tarry.  You have not urinated in 6-8 hours.  You faint.  Your heart rate while sitting still is over 100 beats a minute.  You have trouble breathing. This information is not intended to replace advice given to you by your health care provider. Make sure you discuss any questions you have with your health care provider. Document Released: 10/26/2005 Document Revised: 05/22/2016 Document Reviewed: 12/20/2015 Elsevier Interactive Patient Education  2017 [employer]  

## 2016-09-23 NOTE — Progress Notes (Signed)
Per Dr. Candise CheKale, pt to have labs drawn today and to receive 1 liter NS over an hour. Per Dr. Candise CheKale pt does not need any fluids tomorrow 09/24/16. Pt aware of plan and verblaizes understanding.

## 2016-09-24 ENCOUNTER — Other Ambulatory Visit: Payer: Self-pay | Admitting: Internal Medicine

## 2016-09-24 ENCOUNTER — Ambulatory Visit (INDEPENDENT_AMBULATORY_CARE_PROVIDER_SITE_OTHER): Payer: BLUE CROSS/BLUE SHIELD | Admitting: Nurse Practitioner

## 2016-09-24 ENCOUNTER — Encounter: Payer: Self-pay | Admitting: Nurse Practitioner

## 2016-09-24 ENCOUNTER — Other Ambulatory Visit: Payer: BLUE CROSS/BLUE SHIELD

## 2016-09-24 VITALS — BP 118/70 | HR 72 | Ht 64.25 in | Wt 180.0 lb

## 2016-09-24 DIAGNOSIS — R110 Nausea: Secondary | ICD-10-CM | POA: Diagnosis not present

## 2016-09-24 DIAGNOSIS — R634 Abnormal weight loss: Secondary | ICD-10-CM

## 2016-09-24 DIAGNOSIS — K5900 Constipation, unspecified: Secondary | ICD-10-CM | POA: Diagnosis not present

## 2016-09-24 LAB — ANGIOTENSIN CONVERTING ENZYME: ANGIO CONVERT ENZYME: 38 U/L (ref 14–82)

## 2016-09-24 LAB — HIV ANTIBODY (ROUTINE TESTING W REFLEX): HIV Screen 4th Generation wRfx: NONREACTIVE

## 2016-09-24 LAB — KAPPA/LAMBDA LIGHT CHAINS
IG LAMBDA FREE LIGHT CHAIN: 19.5 mg/L (ref 5.7–26.3)
Ig Kappa Free Light Chain: 28.6 mg/L — ABNORMAL HIGH (ref 3.3–19.4)
KAPPA/LAMBDA FLC RATIO: 1.47 (ref 0.26–1.65)

## 2016-09-24 LAB — VITAMIN D 25 HYDROXY (VIT D DEFICIENCY, FRACTURES): Vitamin D, 25-Hydroxy: 64.6 ng/mL (ref 30.0–100.0)

## 2016-09-24 LAB — AMMONIA: AMMONIA, PLASMA: 53 ug/dL (ref 19–87)

## 2016-09-24 LAB — C-REACTIVE PROTEIN: CRP: 50.1 mg/L — ABNORMAL HIGH (ref 0.0–4.9)

## 2016-09-24 LAB — ZINC: ZINC: 82 ug/dL (ref 56–134)

## 2016-09-24 LAB — COPPER, SERUM: COPPER: 189 ug/dL — AB (ref 72–166)

## 2016-09-24 LAB — ANTINUCLEAR ANTIBODIES, IFA: ANA Titer 1: NEGATIVE

## 2016-09-24 LAB — ANTI-DNA ANTIBODY, DOUBLE-STRANDED: DSDNA AB: 1 [IU]/mL (ref 0–9)

## 2016-09-24 LAB — PTH, INTACT AND CALCIUM
Calcium, Ser: 10.3 mg/dL — ABNORMAL HIGH (ref 8.7–10.2)
PTH: 19 pg/mL (ref 15–65)

## 2016-09-24 LAB — SJOGREN'S SYNDROME ANTIBODS(SSA + SSB)
SSA (Ro) (ENA) Antibody, IgG: <1
SSB (LA) (ENA) ANTIBODY, IGG: NEGATIVE

## 2016-09-24 LAB — ANTI-SMITH ANTIBODY: ENA SM AB SER-ACNC: NEGATIVE

## 2016-09-24 LAB — HEPATITIS C ANTIBODY: Hep C Virus Ab: 0.1 s/co ratio (ref 0.0–0.9)

## 2016-09-24 LAB — SEDIMENTATION RATE: Sedimentation Rate-Westergren: 83 mm/hr — ABNORMAL HIGH (ref 0–32)

## 2016-09-24 LAB — RHEUMATOID ARTHRITIS PROFILE
CCP ANTIBODIES IGG/IGA: 8 U (ref 0–19)
RA Latex Turbid.: 11.2 IU/mL (ref 0.0–13.9)

## 2016-09-24 LAB — C3 AND C4
COMPLEMENT C3, SERUM: 154 mg/dL (ref 82–167)
Complement C4, Serum: 36 mg/dL (ref 14–44)

## 2016-09-24 LAB — LACTATE DEHYDROGENASE: LDH: 142 U/L (ref 125–245)

## 2016-09-24 MED ORDER — ONDANSETRON 8 MG PO TBDP: 8.0000 mg | ORAL_TABLET | Freq: Two times a day (BID) | ORAL | 0 refills | Status: DC

## 2016-09-24 NOTE — Patient Instructions (Addendum)
You have been scheduled for an endoscopy. Please follow written instructions given to you at your visit today. If you use inhalers (even only as needed), please bring them with you on the day of your procedure. Your physician has requested that you go to www.startemmi.com and enter the access code given to you at your visit today. This web site gives a general overview about your procedure. However, you should still follow specific instructions given to you by our office regarding your preparation for the procedure.  zofran 8 mg twice a day as needed   Please purchase the following medications over the counter and take as directed: Miralax daily.

## 2016-09-24 NOTE — Progress Notes (Addendum)
HPI: Patient is a 20 year old female with a history of HTN, obesity,  and GERD. She is new to this practice referred by Dr. Scarlette Calico. for weight loss. In mid September patient saw PCP with upper abdominal pain. Labs drawn, patient found to have acute kidney failure, probably multifactorial (ACEI, aldactone, poor PO intake). She ws admitted overnight for fluid resuscitation. Renal ultrasound negative. Abdominal ultrasound for abdominal pain revealed sludge / cholelithiasis without evidence for cholecystitis. LFTs, lipase were normal. Renal function drastically improved from 4.6----> 1.85. Patient saw her PCP for hospital follow up on 08/25/16. Complained of persistent LUQ pain  and also noted to have a 6 month history of atypical lymphocytosis. CTscan ordered but unrevealing. She was referred for surgical evaluation of upper abdominal pain, nausea and gallstones. She was referred for Hematology for evaluation of lymphocytosis.   Patient admits to weakness preventing her from driving. She has lost over a hundred pounds recently. She has frequent nausea occurring most days of the week. When nauseated there are very few things she can eat which won't lead to vomiting. She has LUQ pain which often occurs after meals or after vomiting. Pain doesn't radiatie, it it not positional. Pain feels like someone is scratching area with finger nails. She has trouble with constipation.  normal. She saw Surgery who recommended GI evaluation. Patient saw Hematology a couple of days ago. It appears she got IV fluid at their office. Stat labs revealed critically low K+ again.  PCP has concerns that patient may have an eating disorder but extensive workup for other etiologies  (autoimmune, endocrine ect) is still in progress.  .   Past Medical History:  Diagnosis Date  . History of blood transfusion    prematurity, this was during perinatal period  . Hypertension   . Morbid obesity ([place]) 04/03/2016  . PCOS  (polycystic ovarian syndrome) 04/03/2016  . Reflux      Past Surgical History:  Procedure Laterality Date  . eustachion tubes     Family History  Problem Relation Age of Onset  . GER disease Mother   . Hypertension Mother   . GER disease Father   . Hypertension Father   . Lactose intolerance Brother   . Hypertension Maternal Grandfather   . Cancer Neg Hx   . Depression Neg Hx   . Drug abuse Neg Hx   . Early death Neg Hx   . Heart disease Neg Hx   . Hyperlipidemia Neg Hx   . Kidney disease Neg Hx   . Alcohol abuse Neg Hx   . Asthma Neg Hx   . Stroke Neg Hx    Social History  Substance Use Topics  . Smoking status: Never Smoker  . Smokeless tobacco: Never Used  . Alcohol use No   Current Outpatient Prescriptions  Medication Sig Dispense Refill  . Biotin 1000 MCG tablet Take 1,000 mcg by mouth at bedtime.    [name] HYDROcodone-homatropine (HYCODAN) 5-1.5 MG/5ML syrup Take 5 mLs by mouth every 8 (eight) hours as needed for cough. 120 mL 0  . Multiple Vitamins-Minerals (MULTIVITAMIN ADULT PO) Take by mouth.    . Norgestimate-Ethinyl Estradiol Triphasic (TRI-PREVIFEM) 0.18/0.215/0.25 MG-35 MCG tablet Take 1 tablet by mouth at bedtime.    [name] omeprazole (PRILOSEC) 20 MG capsule Take 20 mg by mouth at bedtime.    . ondansetron (ZOFRAN) 4 MG tablet Take 1 tablet (4 mg total) by mouth every 8 (eight) hours as needed for nausea  or vomiting. 30 tablet 0  . potassium chloride (KLOR-CON) 20 MEQ packet 62mq PO BID for 3 days then 473m po daily. Recheck potassium with PCP in 1 week 30 packet 0  . potassium chloride (KLOR-CON) 8 MEQ tablet Take 1 tablet (8 mEq total) by mouth daily. 90 tablet 2  . ondansetron (ZOFRAN ODT) 8 MG disintegrating tablet Take 1 tablet (8 mg total) by mouth 2 (two) times daily. 30 tablet 0   No current facility-administered medications for this visit.    Facility-Administered Medications Ordered in Other Visits  Medication Dose Route Frequency Provider Last Rate  Last Dose  . potassium chloride 20 MEQ/15ML (10%) solution 40 mEq  40 mEq Oral Once GaBrunetta GeneraMD       Allergies  Allergen Reactions  . Pineapple Other (See Comments)    Reaction:  Burning   . Other Itching, Swelling, Rash and Other (See Comments)    Reaction:  Burning Pt states that she is allergic to all hair dye.       Review of Systems: All systems reviewed and negative except where noted in HPI.    Physical Exam: BP 118/70   Pulse 72   Ht 5' 4.25" (1.632 m)   Wt 180 lb (81.6 kg)   LMP 08/21/2016   BMI 30.66 kg/m  Constitutional:  Obese [place] in no acute distress. Psychiatric: Normal mood and affect. Behavior is normal. HEENT: Normocephalic and atraumatic. Conjunctivae are normal. No scleral icterus. Neck supple.  Cardiovascular: Normal rate, regular rhythm.  Pulmonary/chest: Effort normal and breath sounds normal. No wheezing, rales or rhonchi. Abdominal: Soft, nondistended, nontender. Bowel sounds active throughout. There are no masses palpable. No hepatomegaly. Extremities: no edema Lymphadenopathy: No cervical adenopathy noted. Neurological: Alert and oriented to person place and time. Skin: Skin is warm and dry. No rashes noted.   ASSESSMENT AND PLAN:   [age]ear [age] female with nausea / vomiting, LUQ pain, significant weight loss. CTscan negative. Cholelithiasis on ultrasound but likely just incidental finding. LFTs, lipase normal.  PUD? Eating disorders? Malignancy?  -for further evaluation patient will be scheduled for EGD. The risks and benefits of EGD were discussed and the patient agrees to proceed.  -Wants something for nausea. Zofran helps some. Would like what she was given while getting IVF at Hematology's office as it helped everything. Looks like that was Ativan. I am going to try her on stronger dose of Ativan, not giving her Ativan.   2. Constipation. She can try Miralax   3. Recurrent hypokalemia.- Poor PO intake + vomiting.  Currently being repleted by Hematology.with BID K+. If remains low would check Mg+ level  4. Lymphocytosis. She had initially consultation with Hematology two days ago. Note is pending  5.  ? Hx of PCOS. Normal renal ultrasoud .     PaTye Savoy11/16/2017, 11:07 PM   JoJanith LimaMD   Thank you for sending this case to me. I have reviewed the entire note as well as Dr JoRonnald Ramprecent note and the CT scan report as well as recent labs.  Her potassium is still 3.0 this morning and magnesium low at 1.4 There is clearly some systemic condition going on with this young woman given her marked lymphocytosis and ESR of 83 Per our conversation just now, I am cancelling her procedure for today.  I feel that elective EGD is unwise with this degree of persistent hypokalemia. Given the severity of her contraction alkalosis 2 days  ago, I feel patient would be best served by hospital admission.  This would allow correction and maintenance of volume and electrolytes before pursuing EGD.  Please speak to the [facility] hospitalist and ask for assistance. I do not know if they would feel comfortable with direct admission or ED.  Then contact patient with the plan and alert Dr Ronnald Ramp as well.

## 2016-09-24 NOTE — Progress Notes (Signed)
AT 1700 - Dr. Candise CheKale was made aware of potassium critical level and new orders were given and passed along to primary nurse Tim RN. Pt reported taking po potassium at home daily but Dr Candise CheKale was increasing her medication and her pharmacy was confirmed. She will be given a po dose here in clinic today too.

## 2016-09-25 ENCOUNTER — Encounter (HOSPITAL_COMMUNITY): Payer: Self-pay | Admitting: *Deleted

## 2016-09-25 ENCOUNTER — Telehealth: Payer: Self-pay | Admitting: Nurse Practitioner

## 2016-09-25 ENCOUNTER — Encounter: Payer: BLUE CROSS/BLUE SHIELD | Admitting: Gastroenterology

## 2016-09-25 ENCOUNTER — Other Ambulatory Visit: Payer: Self-pay

## 2016-09-25 ENCOUNTER — Observation Stay (HOSPITAL_COMMUNITY)
Admission: AD | Admit: 2016-09-25 | Discharge: 2016-09-26 | Disposition: A | Discharge status: Home / Self Care | Payer: BLUE CROSS/BLUE SHIELD | Source: Ambulatory Visit | Attending: Family Medicine | Admitting: Family Medicine

## 2016-09-25 ENCOUNTER — Other Ambulatory Visit (INDEPENDENT_AMBULATORY_CARE_PROVIDER_SITE_OTHER): Payer: BLUE CROSS/BLUE SHIELD

## 2016-09-25 DIAGNOSIS — K5900 Constipation, unspecified: Secondary | ICD-10-CM | POA: Insufficient documentation

## 2016-09-25 DIAGNOSIS — E282 Polycystic ovarian syndrome: Secondary | ICD-10-CM | POA: Diagnosis not present

## 2016-09-25 DIAGNOSIS — Z6831 Body mass index (BMI) 31.0-31.9, adult: Secondary | ICD-10-CM | POA: Diagnosis not present

## 2016-09-25 DIAGNOSIS — E876 Hypokalemia: Secondary | ICD-10-CM

## 2016-09-25 DIAGNOSIS — R634 Abnormal weight loss: Secondary | ICD-10-CM | POA: Diagnosis not present

## 2016-09-25 DIAGNOSIS — G8929 Other chronic pain: Secondary | ICD-10-CM | POA: Diagnosis present

## 2016-09-25 DIAGNOSIS — R627 Adult failure to thrive: Secondary | ICD-10-CM | POA: Diagnosis not present

## 2016-09-25 DIAGNOSIS — Z91048 Other nonmedicinal substance allergy status: Secondary | ICD-10-CM | POA: Insufficient documentation

## 2016-09-25 DIAGNOSIS — R531 Weakness: Secondary | ICD-10-CM

## 2016-09-25 DIAGNOSIS — K219 Gastro-esophageal reflux disease without esophagitis: Secondary | ICD-10-CM | POA: Diagnosis not present

## 2016-09-25 DIAGNOSIS — Z91018 Allergy to other foods: Secondary | ICD-10-CM | POA: Insufficient documentation

## 2016-09-25 DIAGNOSIS — R1012 Left upper quadrant pain: Secondary | ICD-10-CM | POA: Insufficient documentation

## 2016-09-25 DIAGNOSIS — I10 Essential (primary) hypertension: Secondary | ICD-10-CM | POA: Diagnosis not present

## 2016-09-25 DIAGNOSIS — N179 Acute kidney failure, unspecified: Secondary | ICD-10-CM | POA: Insufficient documentation

## 2016-09-25 DIAGNOSIS — D72820 Lymphocytosis (symptomatic): Secondary | ICD-10-CM | POA: Diagnosis present

## 2016-09-25 DIAGNOSIS — R112 Nausea with vomiting, unspecified: Secondary | ICD-10-CM

## 2016-09-25 DIAGNOSIS — K2950 Unspecified chronic gastritis without bleeding: Secondary | ICD-10-CM | POA: Insufficient documentation

## 2016-09-25 LAB — CBC
HCT: 34.3 % — ABNORMAL LOW (ref 36.0–46.0)
HEMOGLOBIN: 11.3 g/dL — AB (ref 12.0–15.0)
MCH: 25.8 pg — ABNORMAL LOW (ref 26.0–34.0)
MCHC: 32.9 g/dL (ref 30.0–36.0)
MCV: 78.3 fL (ref 78.0–100.0)
PLATELETS: 278 10*3/uL (ref 150–400)
RBC: 4.38 MIL/uL (ref 3.87–5.11)
RDW: 13.3 % (ref 11.5–15.5)
WBC: 4.8 10*3/uL (ref 4.0–10.5)

## 2016-09-25 LAB — MULTIPLE MYELOMA PANEL, SERUM
ALBUMIN SERPL ELPH-MCNC: 3.7 g/dL (ref 2.9–4.4)
Albumin/Glob SerPl: 0.9 (ref 0.7–1.7)
Alpha 1: 0.3 g/dL (ref 0.0–0.4)
Alpha2 Glob SerPl Elph-Mcnc: 1 g/dL (ref 0.4–1.0)
B-Globulin SerPl Elph-Mcnc: 1.5 g/dL — ABNORMAL HIGH (ref 0.7–1.3)
Gamma Glob SerPl Elph-Mcnc: 1.4 g/dL (ref 0.4–1.8)
Globulin, Total: 4.2 g/dL — ABNORMAL HIGH (ref 2.2–3.9)
IGA/IMMUNOGLOBULIN A, SERUM: 702 mg/dL — AB (ref 87–352)
IGM (IMMUNOGLOBIN M), SRM: 168 mg/dL (ref 26–217)
IgG, Qn, Serum: 1402 mg/dL (ref 700–1600)
TOTAL PROTEIN: 7.9 g/dL (ref 6.0–8.5)

## 2016-09-25 LAB — COMPREHENSIVE METABOLIC PANEL
ALBUMIN: 3.8 g/dL (ref 3.5–5.0)
ALT: 31 U/L (ref 14–54)
ANION GAP: 11 (ref 5–15)
AST: 26 U/L (ref 15–41)
Alkaline Phosphatase: 46 U/L (ref 38–126)
BILIRUBIN TOTAL: 0.8 mg/dL (ref 0.3–1.2)
BUN: 12 mg/dL (ref 6–20)
CO2: 28 mmol/L (ref 22–32)
Calcium: 9.2 mg/dL (ref 8.9–10.3)
Chloride: 97 mmol/L — ABNORMAL LOW (ref 101–111)
Creatinine, Ser: 0.97 mg/dL (ref 0.44–1.00)
GLUCOSE: 85 mg/dL (ref 65–99)
POTASSIUM: 3.3 mmol/L — AB (ref 3.5–5.1)
Sodium: 136 mmol/L (ref 135–145)
TOTAL PROTEIN: 7.1 g/dL (ref 6.5–8.1)

## 2016-09-25 LAB — POTASSIUM: POTASSIUM: 3 meq/L — AB (ref 3.5–5.1)

## 2016-09-25 LAB — MAGNESIUM
MAGNESIUM: 1.4 mg/dL — AB (ref 1.7–2.4)
Magnesium: 1.4 mg/dL — ABNORMAL LOW (ref 1.5–2.5)

## 2016-09-25 LAB — ANTIEXTRACTABLE NUCLEAR AG

## 2016-09-25 LAB — FLOW CYTOMETRY

## 2016-09-25 MED ORDER — ONDANSETRON HCL 4 MG/2ML IJ SOLN
4.0000 mg | Freq: Four times a day (QID) | INTRAMUSCULAR | Status: DC | PRN
  Administered 2016-09-26: 4 mg via INTRAVENOUS
  Filled 2016-09-25: qty 2

## 2016-09-25 MED ORDER — NORGESTIM-ETH ESTRAD TRIPHASIC 0.18/0.215/0.25 MG-35 MCG PO TABS
1.0000 | ORAL_TABLET | Freq: Every day | ORAL | Status: DC
  Administered 2016-09-25: 1 via ORAL

## 2016-09-25 MED ORDER — ONDANSETRON HCL 4 MG PO TABS: 4.0000 mg | ORAL_TABLET | Freq: Four times a day (QID) | ORAL | Status: DC | PRN

## 2016-09-25 MED ORDER — ACETAMINOPHEN 325 MG PO TABS: 650.0000 mg | ORAL_TABLET | Freq: Four times a day (QID) | ORAL | Status: DC | PRN

## 2016-09-25 MED ORDER — MAGNESIUM SULFATE 4 GM/100ML IV SOLN
4.0000 g | Freq: Once | INTRAVENOUS | Status: AC
  Administered 2016-09-25: 4 g via INTRAVENOUS
  Filled 2016-09-25: qty 100

## 2016-09-25 MED ORDER — PANTOPRAZOLE SODIUM 40 MG PO TBEC
40.0000 mg | DELAYED_RELEASE_TABLET | Freq: Every day | ORAL | Status: DC
  Administered 2016-09-25 – 2016-09-26 (×2): 40 mg via ORAL
  Filled 2016-09-25 (×2): qty 1

## 2016-09-25 MED ORDER — ACETAMINOPHEN 650 MG RE SUPP: 650.0000 mg | Freq: Four times a day (QID) | RECTAL | Status: DC | PRN

## 2016-09-25 MED ORDER — MAGNESIUM SULFATE 50 % IJ SOLN: 4.0000 g | Freq: Once | INTRAMUSCULAR | Status: DC

## 2016-09-25 MED ORDER — POTASSIUM CHLORIDE 20 MEQ/15ML (10%) PO SOLN
40.0000 meq | Freq: Every day | ORAL | Status: DC
  Administered 2016-09-25 – 2016-09-26 (×2): 40 meq via ORAL
  Filled 2016-09-25 (×2): qty 30

## 2016-09-25 MED ORDER — BIOTIN 1000 MCG PO TABS: 1000.0000 ug | ORAL_TABLET | Freq: Every day | ORAL | Status: DC

## 2016-09-25 MED ORDER — SODIUM CHLORIDE 0.9% FLUSH
3.0000 mL | Freq: Two times a day (BID) | INTRAVENOUS | Status: DC
  Administered 2016-09-25: 3 mL via INTRAVENOUS

## 2016-09-25 MED ORDER — ENSURE ENLIVE PO LIQD
237.0000 mL | Freq: Two times a day (BID) | ORAL | Status: DC
  Administered 2016-09-26: 237 mL via ORAL

## 2016-09-25 MED ORDER — SODIUM CHLORIDE 0.9 % IV SOLN
INTRAVENOUS | Status: DC
  Administered 2016-09-25: 17:00:00 via INTRAVENOUS
  Administered 2016-09-26: 1000 mL via INTRAVENOUS

## 2016-09-25 NOTE — H&P (Signed)
History and Physical        Hospital Admission Note Date: 09/25/2016  Patient name: Stacey Holland Medical record number: [id number] Date of birth: 02-20-1996 Age: 20 y.o. Gender: female  PCP: Sanda Lingerhomas Jones, MD   Referring physician:  Ms. Willette Clusteraula Guenther   Patient coming from: GI office    Chief Complaint:  Electrolyte disturbances, needs endoscopy  HPI: Patient is a [age] year old female with multiple medical problems including hypertension, GERD, pathological weight loss about 100 pounds in last 1 year, chronic abdominal pain since September. Patient reported that she was taking metformin for PCOS and lost about 50 lbs with metformin. However in mid-September she saw her PCP, Dr. Yetta BarreJones with upper abdominal pain and then she was found to have acute renal failure with creatinine of 4.6. She was also at that time on ACE inhibitor and spironolactone. She was admitted to the hospital and was placed on IV fluids. Workup was negative however abdominal ultrasound showed cholelithiasis without any evidence for cholecystitis. Renal function recovered. Patient however continued to have persistent left upper quadrant abdominal pain and was noticed to have atypical lymphocytosis. She has been following outpatient hematology follow-up. She has continued to lose weight and has lost about 100 pounds, continues to feel nauseous, chronic abdominal pain after meals with no radiation, dull 5/10., No diarrhea however she has constipation. Patient was that she sore surgery but was recommended GI evaluation. Patient today saw Ms. Wilmon PaliGuenther.she had been recommended endoscopy for further workup however due to low potassium and low magnesium she was recommended inpatient electrolyte replacement and then endoscopy.       Review of Systems: Positives marked in 'bold' Constitutional: Denies fever, chills,  diaphoresis, poor appetite and fatigue.  HEENT: Denies photophobia, eye pain, redness, hearing loss, ear pain, congestion, sore throat, rhinorrhea, sneezing, mouth sores, trouble swallowing, neck pain, neck stiffness and tinnitus.   Respiratory: Denies SOB, DOE, cough, chest tightness,  and wheezing.   Cardiovascular: Denies chest pain, palpitations and leg swelling.  Gastrointestinal: Please see HPI Genitourinary: Denies dysuria, urgency, frequency, hematuria, flank pain and difficulty urinating.  Musculoskeletal: Denies myalgias, back pain, joint swelling, arthralgias and gait problem.  Skin: Denies pallor, rash and wound.  Neurological: Denies + generalized weakness no dizziness  seizures, syncope, light-headedness, numbness and headaches.  Hematological: Denies adenopathy. Easy bruising, personal or family bleeding history  Psychiatric/Behavioral: Denies suicidal ideation, mood changes, confusion, nervousness, sleep disturbance and agitation  Past Medical History: Past Medical History:  Diagnosis Date  . History of blood transfusion    prematurity, this was during perinatal period  . Hypertension   . Morbid obesity ([facility]) 04/03/2016  . PCOS (polycystic ovarian syndrome) 04/03/2016  . Reflux     Past Surgical History:  Procedure Laterality Date  . eustachion tubes      Medications: Prior to Admission medications   Medication Sig Start Date End Date Taking? Authorizing Provider  Biotin 1000 MCG tablet Take 1,000 mcg by mouth at bedtime.    Historical Provider, MD  HYDROcodone-homatropine (HYCODAN) 5-1.5 MG/5ML syrup Take 5 mLs by mouth every 8 (eight) hours as needed for cough. 09/17/16   Etta Grandchildhomas L Jones, MD  Multiple Vitamins-Minerals (  MULTIVITAMIN ADULT PO) Take by mouth.    Historical Provider, MD  Norgestimate-Ethinyl Estradiol Triphasic (TRI-PREVIFEM) 0.18/0.215/0.25 MG-35 MCG tablet Take 1 tablet by mouth at bedtime.    Historical Provider, MD  omeprazole (PRILOSEC) 20 MG  capsule Take 20 mg by mouth at bedtime.    Historical Provider, MD  ondansetron (ZOFRAN ODT) 8 MG disintegrating tablet Take 1 tablet (8 mg total) by mouth 2 (two) times daily. 09/24/16   Meredith PelPaula M Guenther, NP  ondansetron (ZOFRAN) 4 MG tablet Take 1 tablet (4 mg total) by mouth every 8 (eight) hours as needed for nausea or vomiting. 07/21/16   Anne Ngharlotte Lum Nche, NP  potassium chloride (KLOR-CON) 20 MEQ packet 40meq PO BID for 3 days then 40meq po daily. Recheck potassium with PCP in 1 week 09/23/16   Johney MaineGautam Kishore Kale, MD  potassium chloride (KLOR-CON) 8 MEQ tablet Take 1 tablet (8 mEq total) by mouth daily. 09/18/16   Etta Grandchildhomas L Jones, MD    Allergies:   Allergies  Allergen Reactions  . Pineapple Other (See Comments)    Reaction:  Burning   . Other Itching, Swelling, Rash and Other (See Comments)    Reaction:  Burning Pt states that she is allergic to all hair dye.      Social History:  reports that she has never smoked. She has never used smokeless tobacco. She reports that she does not drink alcohol or use drugs.  Family History: Family History  Problem Relation Age of Onset  . GER disease Mother   . Hypertension Mother   . GER disease Father   . Hypertension Father   . Lactose intolerance Brother   . Hypertension Maternal Grandfather   . Cancer Neg Hx   . Depression Neg Hx   . Drug abuse Neg Hx   . Early death Neg Hx   . Heart disease Neg Hx   . Hyperlipidemia Neg Hx   . Kidney disease Neg Hx   . Alcohol abuse Neg Hx   . Asthma Neg Hx   . Stroke Neg Hx     Physical Exam: Blood pressure (!) 135/96, pulse 87, temperature 98.5 F (36.9 C), temperature source Oral, resp. rate 18, height 5' 4.25" (1.632 m), weight 82.9 kg (182 lb 12.8 oz), last menstrual period 09/22/2016, SpO2 99 %. General: Alert, awake, oriented x3, in no acute distress. HEENT: normocephalic, atraumatic, anicteric sclera, pink conjunctiva, pupils equal and reactive to light and accomodation, oropharynx  clear Neck: supple, no masses or lymphadenopathy, no goiter, no bruits  Heart: Regular rate and rhythm, without murmurs, rubs or gallops. Lungs: Clear to auscultation bilaterally, no wheezing, rales or rhonchi. Abdomen: Soft, Mild left upper quadrant tenderness to deep palpation , nondistended, positive bowel sounds, no masses. Extremities: No clubbing, cyanosis or edema with positive pedal pulses. Neuro: Grossly intact, no focal neurological deficits, strength 5/5 upper and lower extremities bilaterally Psych: alert and oriented x 3, normal mood and affect Skin: no rashes or lesions, warm and dry   LABS on Admission:  Basic Metabolic Panel:  Recent Labs Lab 09/23/16 1545 09/23/16 1545 09/25/16 0943  NA  --  134*  --   K  --  2.6* 3.0*  CO2  --  27  --   GLUCOSE  --  78  --   BUN  --  17.0  --   CREATININE  --  1.1  --   CALCIUM 10.3* 10.5*  --   MG  --   --  1.4*   Liver Function Tests:  Recent Labs Lab 09/23/16 1545  AST 29  ALT 44  ALKPHOS 66  BILITOT 0.82  PROT 8.5*  ALBUMIN 3.5   No results for input(s): LIPASE, AMYLASE in the last 168 hours. No results for input(s): AMMONIA in the last 168 hours. CBC:  Recent Labs Lab 09/23/16 1543  WBC 5.8  NEUTROABS 1.2*  HGB 12.7  HCT 37.0  MCV 76.3*  PLT 339   Cardiac Enzymes: No results for input(s): CKTOTAL, CKMB, CKMBINDEX, TROPONINI in the last 168 hours. BNP: Invalid input(s): POCBNP CBG: No results for input(s): GLUCAP in the last 168 hours.  Radiological Exams on Admission:  No results found.  *I have personally reviewed the images above*   Assessment/Plan Principal Problem:   Hypokalemia, hypomagnesemia - Currently no diarrhea, patient is not on any diuretics she has just started taking potassium yesterday hence potassium was slightly improving.  - Unclear etiology, IV magnesium replacement and oral potassium replacement - Recheck labs in a.m.  - Patient is not on any medication contributing  towards hypokalemia or hypomagnesemia   Active Problems:   Loss of weight, failure to thrive - Endoscopy planned in a.m., nothing by mouth after midnight, discussed with GI     GE reflux - Continue PPI    Essential hypertension - Currently stable,    Atypical lymphocytosis  - Continue outpatient follow-up with hematology  DVT prophylaxis:  SCDS  CODE STATUS:  full CODE STATUS   Consults called: GI / labauer   Family Communication: Admission, patients condition and plan of care including tests being ordered have been discussed with the patient and mother who indicates understanding and agree with the plan and Code Status  Admission status: obs tele   Disposition plan: Further plan will depend as patient's clinical course evolves and further radiologic and laboratory data become available. Likely home tomorrow  after endoscopy   At the time of admission, it appears that the appropriate admission status for this patient is observation.  This is judged to be reasonable and necessary in order to provide the required intensity of service to ensure the patient's safety given the presenting symptoms, physical exam findings, and initial radiographic and laboratory data in the context of their chronic comorbidities.      Time Spent on Admission:   RAI,RIPUDEEP M.D. [facility] 09/25/2016, 2:57 PM Pager: [phone]  If 7PM-7AM, please contact night-coverage [email or web address] Password [email or web address]

## 2016-09-25 NOTE — Telephone Encounter (Signed)
Procedure is cancelled. Gunnar Fusiaula will call the mother asap.

## 2016-09-25 NOTE — Progress Notes (Signed)
Daily Rounding Note  09/25/2016, 4:33 PM  LOS: 0 days   SUBJECTIVE:   20 y/o [place] female.  Chief complaint: weight loss, abdominal pain, nausea, post prandial vomiting, anorexia.   Pt seen for initial GI visit on 11/18 by NP at GI office.  C/o abd pain, intermittent nausea, unintentional wt loss of 100#.  When nauseated, which is more often than not, she is unable to eat.  When not nauseated she can tolerate food well.  Not a lot of vomiting and it is never bloody or  CG.    LLQ pain is intermittent, not severe and not associated with the nausea.   sxs have not improved with Omeprazole.  Zofran helps some.  A surgeon had seen her previously and recommended GI evaluation.  Ultrasound 07/21/16 with GB stones and sludge but not cholecystitis.  Biliary tree not dilated. CT scan 09/01/16 was unremarkable.  Had AKI in 07/2016 due to poor po intake, dehydration, Aldactone and ACEI.    Has had atypical lymphocytosis.  Seen by oncology, Dr Irene Limbo and peripheral  blood smear of 09/23/16 shows no monoclonal B cell or aberrant T cell populations.  CRP, ESR are elevated.  His visit note is not yet completed.  Was set up for outpt EGD today with Dr Loletha Carrow.  This was cancelled after labs came back with Potassium just 3.0.  It had been 2.6 on 11/15 at hematology clinic and was given additional potassium on top of her home dose.  Mag low at 1.4.  Renal function ok.  LFTs normal.   She is now admitted to hospitalist for correction of potassium, magnesium and plans are for EGD tomorrow with DR Pyrtle if electrolytes are corrected.   OBJECTIVE:         Vital signs in last 24 hours:    Temp:  [98.5 F (36.9 C)] 98.5 F (36.9 C) (11/17 1423) Pulse Rate:  [87] 87 (11/17 1423) Resp:  [18] 18 (11/17 1423) BP: (135)/(96) 135/96 (11/17 1423) SpO2:  [99 %] 99 % (11/17 1423) Weight:  [82.9 kg (182 lb 12.8 oz)] 82.9 kg (182 lb 12.8 oz) (11/17 1423) Last BM  Date: 09/22/16 Filed Weights   09/25/16 1423  Weight: 82.9 kg (182 lb 12.8 oz)   General: obese, [place] female.  Comfortable, no distress  Looks well (this is a good day for her) Heart: RRR.  No mrg.  S1, s2 present Chest: clear bil.  No labored breathing or cough Abdomen: soft, NT, ND.  No mass or HSM.   NT.  BS normoactive.  Extremities: no CCE.  Oriented x 3.   Neuro/Psych:  Cooperative. Fully engaged and alert.  Calm.  Not depressed or anxious.   Intake/Output from previous day: No intake/output data recorded.  Intake/Output this shift: No intake/output data recorded.  Lab Results:  Recent Labs  09/23/16 1543 09/25/16 1557  WBC 5.8 4.8  HGB 12.7 11.3*  HCT 37.0 34.3*  PLT 339 278   BMET  Recent Labs  09/23/16 1545 09/23/16 1545 09/25/16 0943  NA  --  134*  --   K  --  2.6* 3.0*  CO2  --  27  --   GLUCOSE  --  78  --   BUN  --  17.0  --   CREATININE  --  1.1  --   CALCIUM 10.3* 10.5*  --    LFT  Recent Labs  09/23/16 1545 09/23/16 1545  PROT 7.9 8.5*  ALBUMIN  --  3.5  AST  --  29  ALT  --  44  ALKPHOS  --  66  BILITOT  --  0.82   PT/INR No results for input(s): LABPROT, INR in the last 72 hours. Hepatitis Panel No results for input(s): HEPBSAG, HCVAB, HEPAIGM, HEPBIGM in the last 72 hours.  Studies/Results: No results found.  ASSESMENT:   *  Unexplained anorexia, nausea, vomiting, 100# weight loss.     *  Hypokalemia and hypomagnessemia.    *  Atypical lymphocytosis.  DR Pollie Meyer work up in progress   PLAN   *  EGD for tomorrow if K is corrected.  NPO after midnight. Pt aware.  Currently eating regular diet.      Stacey Holland  09/25/2016, 4:33 PM Pager: [phone]

## 2016-09-25 NOTE — Telephone Encounter (Signed)
[facility]. Mother is notified and will take her to Admitting.

## 2016-09-25 NOTE — Progress Notes (Signed)
PHARMACIST - PHYSICIAN ORDER COMMUNICATION  CONCERNING: P&T Medication Policy on Herbal Medications  DESCRIPTION:  This patient's order for:  Biotin has been noted.  This product(s) is classified as an "herbal" or natural product. Due to a lack of definitive safety studies or FDA approval, nonstandard manufacturing practices, plus the potential risk of unknown drug-drug interactions while on inpatient medications, the Pharmacy and Therapeutics Committee does not permit the use of "herbal" or natural products of this type within [facility].   ACTION TAKEN: The pharmacy department is unable to verify this order at this time and the order has been discontinued. Please reevaluate patient's clinical condition at discharge and address if the herbal or natural product(s) should be resumed at that time.  Adalberto ColeNikola Reha Martinovich, PharmD, BCPS Pager [phone] 09/25/2016 3:00 PM

## 2016-09-26 ENCOUNTER — Encounter (HOSPITAL_COMMUNITY)
Admission: AD | Disposition: A | Discharge status: Home / Self Care | Payer: Self-pay | Source: Ambulatory Visit | Attending: Internal Medicine

## 2016-09-26 ENCOUNTER — Encounter (HOSPITAL_COMMUNITY): Payer: Self-pay

## 2016-09-26 DIAGNOSIS — R634 Abnormal weight loss: Secondary | ICD-10-CM | POA: Diagnosis not present

## 2016-09-26 DIAGNOSIS — R112 Nausea with vomiting, unspecified: Secondary | ICD-10-CM

## 2016-09-26 DIAGNOSIS — E876 Hypokalemia: Secondary | ICD-10-CM | POA: Diagnosis not present

## 2016-09-26 DIAGNOSIS — R1012 Left upper quadrant pain: Secondary | ICD-10-CM

## 2016-09-26 HISTORY — PX: ESOPHAGOGASTRODUODENOSCOPY: SHX5428

## 2016-09-26 LAB — CBC
HCT: 33.8 % — ABNORMAL LOW (ref 36.0–46.0)
Hemoglobin: 11 g/dL — ABNORMAL LOW (ref 12.0–15.0)
MCH: 25.9 pg — AB (ref 26.0–34.0)
MCHC: 32.5 g/dL (ref 30.0–36.0)
MCV: 79.7 fL (ref 78.0–100.0)
PLATELETS: 259 10*3/uL (ref 150–400)
RBC: 4.24 MIL/uL (ref 3.87–5.11)
RDW: 13.5 % (ref 11.5–15.5)
WBC: 4.7 10*3/uL (ref 4.0–10.5)

## 2016-09-26 LAB — BASIC METABOLIC PANEL
Anion gap: 8 (ref 5–15)
BUN: 12 mg/dL (ref 6–20)
CALCIUM: 8.8 mg/dL — AB (ref 8.9–10.3)
CHLORIDE: 101 mmol/L (ref 101–111)
CO2: 29 mmol/L (ref 22–32)
CREATININE: 1.03 mg/dL — AB (ref 0.44–1.00)
GFR calc non Af Amer: >60 mL/min (ref >60)
Glucose, Bld: 96 mg/dL (ref 65–99)
Potassium: 3.5 mmol/L (ref 3.5–5.1)
Sodium: 138 mmol/L (ref 135–145)

## 2016-09-26 LAB — MAGNESIUM: MAGNESIUM: 2.1 mg/dL (ref 1.7–2.4)

## 2016-09-26 SURGERY — EGD (ESOPHAGOGASTRODUODENOSCOPY)
Anesthesia: Moderate Sedation

## 2016-09-26 MED ORDER — MIDAZOLAM HCL 10 MG/2ML IJ SOLN
INTRAMUSCULAR | Status: DC | PRN
  Administered 2016-09-26: 1 mg via INTRAVENOUS
  Administered 2016-09-26 (×2): 2 mg via INTRAVENOUS
  Administered 2016-09-26: 1 mg via INTRAVENOUS
  Administered 2016-09-26: 2 mg via INTRAVENOUS

## 2016-09-26 MED ORDER — METOCLOPRAMIDE HCL 5 MG PO TABS: 5.0000 mg | ORAL_TABLET | Freq: Three times a day (TID) | ORAL | 0 refills | Status: DC

## 2016-09-26 MED ORDER — FENTANYL CITRATE (PF) 100 MCG/2ML IJ SOLN
INTRAMUSCULAR | Status: AC
  Filled 2016-09-26: qty 2

## 2016-09-26 MED ORDER — BUTAMBEN-TETRACAINE-BENZOCAINE 2-2-14 % EX AERO
INHALATION_SPRAY | CUTANEOUS | Status: DC | PRN
  Administered 2016-09-26: 2 via TOPICAL

## 2016-09-26 MED ORDER — DIPHENHYDRAMINE HCL 50 MG/ML IJ SOLN
INTRAMUSCULAR | Status: AC
  Filled 2016-09-26: qty 1

## 2016-09-26 MED ORDER — METOCLOPRAMIDE HCL 5 MG PO TABS
5.0000 mg | ORAL_TABLET | Freq: Three times a day (TID) | ORAL | Status: DC
  Administered 2016-09-26 (×2): 5 mg via ORAL
  Filled 2016-09-26 (×7): qty 1

## 2016-09-26 MED ORDER — DIPHENHYDRAMINE HCL 50 MG/ML IJ SOLN
INTRAMUSCULAR | Status: DC | PRN
  Administered 2016-09-26: 25 mg via INTRAVENOUS

## 2016-09-26 MED ORDER — POTASSIUM CHLORIDE 20 MEQ PO PACK
40.0000 meq | PACK | Freq: Every day | ORAL | 0 refills | Status: DC
Start: 2016-09-26 — End: 2016-10-08

## 2016-09-26 MED ORDER — FENTANYL CITRATE (PF) 100 MCG/2ML IJ SOLN
INTRAMUSCULAR | Status: DC | PRN
  Administered 2016-09-26 (×3): 25 ug via INTRAVENOUS

## 2016-09-26 MED ORDER — MIDAZOLAM HCL 5 MG/ML IJ SOLN
INTRAMUSCULAR | Status: AC
  Filled 2016-09-26: qty 2

## 2016-09-26 NOTE — Progress Notes (Signed)
Patient ID: Earley AbideNuha Z Holland, female   DOB: 14-Dec-1995, 20 y.o.   MRN: [id number]  EGD completed- mucosa appears normal- bx taken,  component of gastroparesis  Discussed ongoing workup with Mother- pt would like to go home   Zofran has been helpful- continue 4 mg   30 minutes before meals  Will add Reglan 5 mg 30 minutes before meals   Pt may be discharged from GI standpoint Multiple labs , studies in progress

## 2016-09-26 NOTE — Discharge Summary (Signed)
Physician Discharge Summary  Stacey Holland BJY:[id number] DOB: 15-Jun-1996 DOA: 09/25/2016  PCP: Sanda Linger, MD  Admit date: 09/25/2016 Discharge date: 09/26/2016  Admitted From: Home Disposition: Home   Recommendations for Outpatient Follow-up:  1. Follow up with Heme/Onc, Dr. Candise Che as scheduled 2. Follow up for lab appointment to monitor potassium and magnesium 3. Monitor GI symptoms: started scheduled zofran and reglan for suspected gastroparesis. 4. Consider CCK HIDA given presence of gallstones. 5. Please follow up on the following pending results:  1. Multiple biopsies during EGD 11/18.  Home Health: None Equipment/Devices: None Discharge Condition: Stable CODE STATUS: Full Diet recommendation: Soft and advance as tolerated  Brief/Interim Summary: Stacey Holland is a 20 y.o. female with HTN, GERD, and pathologic weight loss (317lbs in 2015 > 182lbs on admission) with chronic abdominal pain.   Due to continued weight loss, abdominal pain after meals and constipation she was referred to GI. On the day of admission, she saw Ms. Wilmon Pali. She recommended inpatient electrolyte replacement due to low potassium and low magnesium, followed by endoscopy. She was observed overnight on 11/17, given IV magnesium and oral potassium supplements with normalization of electrolytes on 11/18. EGD showed a normal esophagus, food residue in gastric body, and normal mucosa. Multiple gastric biopsies were obtained. The patient has remained in no distress, eating well following procedure and is discharged with follow up scheduled for laboratory monitoring or electrolytes, as well as GI and Heme/Onc follow up.   Further history: Patient reported that she was taking metformin for PCOS and lost about 50 lbs with metformin. However in mid-September she saw her PCP, Dr. Yetta Barre with upper abdominal pain and then she was found to have acute renal failure with creatinine of 4.6. She was also at that time on ACE  inhibitor and spironolactone. She was admitted to the hospital and was placed on IV fluids. Workup was negative however abdominal ultrasound showed cholelithiasis without any evidence for cholecystitis. Renal function recovered. Patient however continued to have persistent left upper quadrant abdominal pain and was noticed to have atypical lymphocytosis. She has been following outpatient hematology follow-up.  Discharge Diagnoses:  Principal Problem:   Hypokalemia Active Problems:   GE reflux   Essential hypertension   Atypical lymphocytosis   Hypomagnesemia   Loss of weight   Nausea and vomiting  Hypokalemia, hypomagnesemia: Due to poor po. No evidence of GI losses or causative medications. Resolved following IV magnesium and po potassium supplementation.   Suspected gastroparesis, GERD: - Zofran has been helpful: continue 4 mg po 30 minutes before meals  - Add Reglan 5 mg po 30 minutes before meals  - No esophagitis on EGD 11/18 (see full results below) - Continued PPI - Follow up with GI after biopsy results return  Atypical lymphocytosis  - Continue outpatient follow-up with hematology  Discharge Instructions Discharge Instructions    Diet - low sodium heart healthy    Complete by:  As directed    Discharge instructions    Complete by:  As directed    You were admitted for observation in the hospital to get IV fluids as well as potassium and magnesium. These were given and your numbers are normal. You have the report of the EGD performed this morning.  - Follow all recommendations by GI including:  Take zofran 4mg  by mouth 30 minutes before meals AND reglan 5mg  by mouth 30 minutes before meals.  Continue taking potassium: daily until you follow up. - Seek medical attention if  any symptoms worsen.   Increase activity slowly    Complete by:  As directed        Medication List    STOP taking these medications   ondansetron 4 MG tablet Commonly known as:  ZOFRAN    ondansetron 8 MG disintegrating tablet Commonly known as:  ZOFRAN ODT     TAKE these medications   acetaminophen 500 MG tablet Commonly known as:  TYLENOL Take 1,000 mg by mouth every 6 (six) hours as needed for moderate pain.   HYDROcodone-homatropine 5-1.5 MG/5ML syrup Commonly known as:  HYCODAN Take 5 mLs by mouth every 8 (eight) hours as needed for cough.   metoCLOPramide 5 MG tablet Commonly known as:  REGLAN Take 1 tablet (5 mg total) by mouth 3 (three) times daily before meals.   multivitamin with minerals Tabs tablet Take 1 tablet by mouth daily.   omeprazole 20 MG capsule Commonly known as:  PRILOSEC Take 20 mg by mouth at bedtime.   potassium chloride 20 MEQ packet Commonly known as:  KLOR-CON Take 40 mEq by mouth daily. What changed:  how much to take  how to take this  when to take this  additional instructions  Another medication with the same name was removed. Continue taking this medication, and follow the directions you see here.   TRI-PREVIFEM 0.18/0.215/0.25 MG-35 MCG tablet Generic drug:  Norgestimate-Ethinyl Estradiol Triphasic Take 1 tablet by mouth at bedtime.       Allergies  Allergen Reactions  . Pineapple Other (See Comments)    Reaction:  Burning   . Other Itching, Swelling, Rash and Other (See Comments)    Reaction:  Burning Pt states that she is allergic to all hair dye.      Consultations:  Gastroenterology, Dr. Rhea Belton  Procedures/Studies: Dg Chest 2 View  Result Date: 09/18/2016 CLINICAL DATA:  Five days of nonproductive cough. History of morbid obesity, gastroesophageal reflux. EXAM: CHEST  2 VIEW COMPARISON:  PA and lateral chest x-ray of June 27, 2010 FINDINGS: The lungs are well-expanded and clear. The heart and pulmonary vascularity are normal. The mediastinum is normal in width. There is no pleural effusion. The bony thorax exhibits no acute abnormality. IMPRESSION: There is no active cardiopulmonary disease.  Electronically Signed   By: David  [place] M.D.   On: 09/18/2016 07:09   Ct Abdomen Pelvis W Contrast  Result Date: 09/01/2016 CLINICAL DATA:  Atypical lymphocytosis. Left upper quadrant pain for several months. EXAM: CT ABDOMEN AND PELVIS WITH CONTRAST TECHNIQUE: Multidetector CT imaging of the abdomen and pelvis was performed using the standard protocol following bolus administration of intravenous contrast. CONTRAST:  ISOVUE-300 IOPAMIDOL (ISOVUE-300) INJECTION 61% COMPARISON:  None. FINDINGS: Lower Chest: No acute findings. Hepatobiliary: No mass identified. Focal fatty infiltration adjacent to falciform ligament. Gallbladder is unremarkable. Pancreas:  No mass or inflammatory changes. Spleen: Within normal limits in size and appearance. Adrenals/Urinary Tract: No masses identified. No evidence of hydronephrosis. Stomach/Bowel: No evidence of obstruction, inflammatory process or abnormal fluid collections. Normal appendix visualized. Vascular/Lymphatic: No pathologically enlarged lymph nodes. No abdominal aortic aneurysm. Reproductive:  No mass identified. Other:  None. Musculoskeletal:  No suspicious bone lesions identified. IMPRESSION: No acute findings within the abdomen or pelvis. No evidence of splenomegaly or lymphadenopathy. Electronically Signed   By: Myles Rosenthal M.D.   On: 09/01/2016 17:05   EGD by Dr. Rhea Belton 09/26/2016: - Normal esophagus. - A small amount of food (residue) in the stomach. - Normal mucosa was found in  the entire stomach. - Normal examined duodenum. Biopsied. - Multiple biopsies were obtained in the gastric body, at the incisura and in the gastric antrum.  Recommendation:           - Return patient to hospital ward for ongoing care. - Await biopsies. - Continue current medications. Anti-emetics can be scheduled to improve nausea and vomiting. - Further evaluation with Dr. Candise CheKale given lymphocytosis, elevated inflammatory markers. Small amount of food in the stomach  raises question of gastroparesis, though if present is likely secondary to inflammatory process rather than primary etiology of symptom complex/laboratory abnormalities. - Resume previous diet. - Consider CCK HIDA given gallstones.  Subjective: Patient is without complaints following EGD. Has print out of EGD report and has been directed by GI to take zofran and reglan 30 minutes before meals. Tolerating lunch without abdominal pain or nausea. Ready to go home.  Discharge Exam: Vitals:   09/26/16 0918 09/26/16 1318  BP: 122/79 118/74  Pulse: 85 78  Resp: 14 16  Temp:  97.6 F (36.4 C)   Vitals:   09/26/16 0900 09/26/16 0910 09/26/16 0918 09/26/16 1318  BP: 129/80 123/79 122/79 118/74  Pulse: 93 88 85 78  Resp: 19 19 14 16   Temp:    97.6 F (36.4 C)  TempSrc:    Oral  SpO2: 100% 99% 100% 100%  Weight:      Height:       General: Pt is alert, awake, not in acute distress Cardiovascular: RRR, S1/S2 +, no rubs, no gallops Respiratory: CTA bilaterally, no wheezing, no rhonchi Abdominal: Soft, NT, ND, bowel sounds + Extremities: no edema, no cyanosis  The results of significant diagnostics from this hospitalization (including imaging, microbiology, ancillary and laboratory) are listed below for reference.    Microbiology: No results found for this or any previous visit (from the past 240 hour(s)).   Labs: BNP (last 3 results) No results for input(s): BNP in the last 8760 hours. Basic Metabolic Panel:  Recent Labs Lab 09/23/16 1545 09/23/16 1545 09/25/16 0943 09/25/16 1557 09/26/16 0509  NA  --  134*  --  136 138  K  --  2.6* 3.0* 3.3* 3.5  CL  --   --   --  97* 101  CO2  --  27  --  28 29  GLUCOSE  --  78  --  85 96  BUN  --  17.0  --  12 12  CREATININE  --  1.1  --  0.97 1.03*  CALCIUM 10.3* 10.5*  --  9.2 8.8*  MG  --   --  1.4* 1.4* 2.1   Liver Function Tests:  Recent Labs Lab 09/23/16 1545 09/23/16 1545 09/25/16 1557  AST  --  29 26  ALT  --  44 31   ALKPHOS  --  66 46  BILITOT  --  0.82 0.8  PROT 7.9 8.5* 7.1  ALBUMIN  --  3.5 3.8   No results for input(s): LIPASE, AMYLASE in the last 168 hours. No results for input(s): AMMONIA in the last 168 hours. CBC:  Recent Labs Lab 09/23/16 1543 09/25/16 1557 09/26/16 0509  WBC 5.8 4.8 4.7  NEUTROABS 1.2*  --   --   HGB 12.7 11.3* 11.0*  HCT 37.0 34.3* 33.8*  MCV 76.3* 78.3 79.7  PLT 339 278 259   Cardiac Enzymes: No results for input(s): CKTOTAL, CKMB, CKMBINDEX, TROPONINI in the last 168 hours. BNP: Invalid input(s): POCBNP CBG: No results  for input(s): GLUCAP in the last 168 hours. D-Dimer No results for input(s): DDIMER in the last 72 hours. Hgb A1c No results for input(s): HGBA1C in the last 72 hours. Lipid Profile No results for input(s): CHOL, HDL, LDLCALC, TRIG, CHOLHDL, LDLDIRECT in the last 72 hours. Thyroid function studies No results for input(s): TSH, T4TOTAL, T3FREE, THYROIDAB in the last 72 hours.  Invalid input(s): FREET3 Anemia work up  Recent Labs  09/23/16 1543  RETICCTPCT 1.12   Urinalysis    Component Value Date/Time   BILIRUBINUR 1 07/21/2016 1101   PROTEINUR 30 07/21/2016 1101   UROBILINOGEN 0.2 07/21/2016 1101   NITRITE neg 07/21/2016 1101   LEUKOCYTESUR Negative 07/21/2016 1101   Sepsis Labs Invalid input(s): PROCALCITONIN,  WBC,  LACTICIDVEN Microbiology No results found for this or any previous visit (from the past 240 hour(s)).  Time coordinating discharge: Over 30 minutes  Stacey Junkeryan Felicidad Sugarman, MD  [facility] 09/26/2016, 1:43 PM Pager [phone]  If 7PM-7AM, please contact night-coverage [email or web address] Password [email or web address]

## 2016-09-26 NOTE — Op Note (Signed)
[facility] Patient Name: Stacey Holland Procedure Date: 09/26/2016 MRN: [id number] Attending MD: Beverley FiedlerJay M Kory Panjwani , MD Date of Birth: 03/25/1996 CSN: [id number] Age: [age] Admit Type: Inpatient Procedure:                Upper GI endoscopy Indications:              Abdominal pain in the left upper quadrant, Nausea                            with vomiting, Weight loss Providers:                Carie CaddyJay M. Rhea BeltonPyrtle, MD, Priscella MannAutumn Goldsmith, RN, Oletha Blendavida                            Shoffner, Technician Referring MD:             [facility]                           Starr LakeHenry L. Danis, MD Medicines:                Fentanyl 75 micrograms IV, Midazolam 8 mg IV,                            Diphenhydramine 25 mg IV Complications:            No immediate complications. Estimated Blood Loss:     Estimated blood loss was minimal. Procedure:                Pre-Anesthesia Assessment:                           - Prior to the procedure, a History and Physical                            was performed, and patient medications and                            allergies were reviewed. The patient's tolerance of                            previous anesthesia was also reviewed. The risks                            and benefits of the procedure and the sedation                            options and risks were discussed with the patient.                            All questions were answered, and informed consent                            was obtained. Prior Anticoagulants: The patient has  taken no previous anticoagulant or antiplatelet                            agents. ASA Grade Assessment: II - A patient with                            mild systemic disease. After reviewing the risks                            and benefits, the patient was deemed in                            satisfactory condition to undergo the procedure.                           After obtaining informed  consent, the endoscope was                            passed under direct vision. Throughout the                            procedure, the patient's blood pressure, pulse, and                            oxygen saturations were monitored continuously. The                            [id number] ([id number]) scope was introduced through the                            mouth, and advanced to the third part of duodenum.                            The upper GI endoscopy was accomplished without                            difficulty. The patient tolerated the procedure                            well. Scope In: Scope Out: Findings:      The examined esophagus was normal.      A small amount of food (residue) was found in the gastric body.      Normal mucosa was found in the entire examined stomach. Multiple       biopsies were obtained with cold forceps for histology and Helicobacter       pylori testing in the gastric body, at the incisura and in the gastric       antrum.      The cardia and gastric fundus were normal on retroflexion.      The examined duodenum was normal. Biopsies for histology were taken with       a cold forceps for evaluation of celiac disease. Impression:               - Normal esophagus.                           -  A small amount of food (residue) in the stomach.                           - Normal mucosa was found in the entire stomach.                           - Normal examined duodenum. Biopsied.                           - Multiple biopsies were obtained in the gastric                            body, at the incisura and in the gastric antrum. Moderate Sedation:      Moderate (conscious) sedation was administered by the endoscopy nurse       and supervised by the endoscopist. The following parameters were       monitored: oxygen saturation, heart rate, blood pressure, and response       to care. Total physician intraservice time was 32 minutes. Recommendation:            - Return patient to hospital ward for ongoing care.                           - Await biopsies.                           - Continue current medications. Anti-emetics can be                            scheduled to improve nausea and vomiting.                           - Further evaluation with Dr. Candise CheKale given                            lymphocytosis, elevated inflammatory markers. Small                            amount of food in the stomach raises question of                            gastroparesis, though if present is likely                            secondary to inflammatory process rather than                            primary etiology of symptom complex/laboratory                            abnormalities.                           - Resume previous diet.                           -  Consider CCK HIDA given gallstones. Procedure Code(s):        --- Professional ---                           [phone], Esophagogastroduodenoscopy, flexible,                            transoral; with biopsy, single or multiple                           [id number], Moderate sedation services provided by the                            same physician or other qualified health care                            professional performing the diagnostic or                            therapeutic service that the sedation supports,                            requiring the presence of an independent trained                            observer to assist in the monitoring of the                            patient's level of consciousness and physiological                            status; initial 15 minutes of intraservice time,                            patient age 95 years or older                           [phone], Moderate sedation services; each additional                            15 minutes intraservice time Diagnosis Code(s):        --- Professional ---                           R10.12, Left upper quadrant pain                            R11.2, Nausea with vomiting, unspecified                           R63.4, Abnormal weight loss CPT copyright 2016 [employer]. All rights reserved. The codes documented in this report are preliminary and upon coder review may  be revised to meet current compliance requirements. Beverley Fiedler, MD 09/26/2016 9:09:13 AM This report has been signed electronically. Number of Addenda: 0

## 2016-09-28 ENCOUNTER — Encounter (HOSPITAL_COMMUNITY): Payer: Self-pay | Admitting: Internal Medicine

## 2016-09-28 LAB — CARNITINE / ACYLCARNITINE PROFILE, BLD
CARNITINE FREE: 23 umol/L (ref 20–55)
CARNITINE TOTAL: 56 umol/L (ref 27–73)
CARNITINE, ESTERFIED/FREE: 1.4 ratio — AB (ref 0.0–0.9)

## 2016-09-28 LAB — HEMOCHROMATOSIS DNA-PCR(C282Y,H63D)

## 2016-09-30 ENCOUNTER — Ambulatory Visit (HOSPITAL_COMMUNITY)
Admission: RE | Admit: 2016-09-30 | Discharge: 2016-09-30 | Disposition: A | Discharge status: Home / Self Care | Payer: BLUE CROSS/BLUE SHIELD | Source: Ambulatory Visit | Attending: Hematology | Admitting: Hematology

## 2016-09-30 DIAGNOSIS — R05 Cough: Secondary | ICD-10-CM | POA: Diagnosis present

## 2016-09-30 DIAGNOSIS — R531 Weakness: Secondary | ICD-10-CM

## 2016-09-30 DIAGNOSIS — R0602 Shortness of breath: Secondary | ICD-10-CM | POA: Insufficient documentation

## 2016-09-30 DIAGNOSIS — R059 Cough, unspecified: Secondary | ICD-10-CM

## 2016-09-30 DIAGNOSIS — R21 Rash and other nonspecific skin eruption: Secondary | ICD-10-CM

## 2016-09-30 DIAGNOSIS — D72820 Lymphocytosis (symptomatic): Secondary | ICD-10-CM

## 2016-09-30 DIAGNOSIS — R634 Abnormal weight loss: Secondary | ICD-10-CM

## 2016-10-06 ENCOUNTER — Other Ambulatory Visit: Payer: Self-pay | Admitting: *Deleted

## 2016-10-06 DIAGNOSIS — R634 Abnormal weight loss: Secondary | ICD-10-CM

## 2016-10-06 DIAGNOSIS — D72820 Lymphocytosis (symptomatic): Secondary | ICD-10-CM

## 2016-10-06 DIAGNOSIS — R531 Weakness: Secondary | ICD-10-CM

## 2016-10-06 DIAGNOSIS — R21 Rash and other nonspecific skin eruption: Secondary | ICD-10-CM

## 2016-10-08 ENCOUNTER — Ambulatory Visit (HOSPITAL_BASED_OUTPATIENT_CLINIC_OR_DEPARTMENT_OTHER): Payer: BLUE CROSS/BLUE SHIELD

## 2016-10-08 ENCOUNTER — Encounter: Payer: Self-pay | Admitting: Hematology

## 2016-10-08 ENCOUNTER — Ambulatory Visit (HOSPITAL_BASED_OUTPATIENT_CLINIC_OR_DEPARTMENT_OTHER): Payer: BLUE CROSS/BLUE SHIELD | Admitting: Hematology

## 2016-10-08 DIAGNOSIS — D72820 Lymphocytosis (symptomatic): Secondary | ICD-10-CM

## 2016-10-08 DIAGNOSIS — R700 Elevated erythrocyte sedimentation rate: Secondary | ICD-10-CM

## 2016-10-08 DIAGNOSIS — E876 Hypokalemia: Secondary | ICD-10-CM

## 2016-10-08 DIAGNOSIS — D649 Anemia, unspecified: Secondary | ICD-10-CM | POA: Diagnosis not present

## 2016-10-08 LAB — CBC & DIFF AND RETIC
BASO%: 0.2 % (ref 0.0–2.0)
Basophils Absolute: 0 10*3/uL (ref 0.0–0.1)
EOS ABS: 0.1 10*3/uL (ref 0.0–0.5)
EOS%: 1 % (ref 0.0–7.0)
HCT: 34.9 % (ref 34.8–46.6)
HGB: 11.5 g/dL — ABNORMAL LOW (ref 11.6–15.9)
Immature Retic Fract: 0.8 % — ABNORMAL LOW (ref 1.60–10.00)
LYMPH%: 60 % — ABNORMAL HIGH (ref 14.0–49.7)
MCH: 26 pg (ref 25.1–34.0)
MCHC: 33 g/dL (ref 31.5–36.0)
MCV: 79 fL — ABNORMAL LOW (ref 79.5–101.0)
MONO#: 0.3 10*3/uL (ref 0.1–0.9)
MONO%: 5.4 % (ref 0.0–14.0)
NEUT%: 33.4 % — ABNORMAL LOW (ref 38.4–76.8)
NEUTROS ABS: 2 10*3/uL (ref 1.5–6.5)
Platelets: 259 10*3/uL (ref 145–400)
RBC: 4.42 10*6/uL (ref 3.70–5.45)
RDW: 14.8 % — AB (ref 11.2–14.5)
RETIC %: 1.24 % (ref 0.70–2.10)
Retic Ct Abs: 54.81 10*3/uL (ref 33.70–90.70)
WBC: 6.1 10*3/uL (ref 3.9–10.3)
lymph#: 3.7 10*3/uL — ABNORMAL HIGH (ref 0.9–3.3)

## 2016-10-08 LAB — COMPREHENSIVE METABOLIC PANEL
ALT: 17 U/L (ref 0–55)
AST: 18 U/L (ref 5–34)
Albumin: 2.9 g/dL — ABNORMAL LOW (ref 3.5–5.0)
Alkaline Phosphatase: 47 U/L (ref 40–150)
Anion Gap: 9 mEq/L (ref 3–11)
BUN: 8.6 mg/dL (ref 7.0–26.0)
CO2: 23 mEq/L (ref 22–29)
Calcium: 9.3 mg/dL (ref 8.4–10.4)
Chloride: 108 mEq/L (ref 98–109)
Creatinine: 0.8 mg/dL (ref 0.6–1.1)
GLUCOSE: 96 mg/dL (ref 70–140)
POTASSIUM: 4.1 meq/L (ref 3.5–5.1)
SODIUM: 141 meq/L (ref 136–145)
Total Bilirubin: 0.47 mg/dL (ref 0.20–1.20)
Total Protein: 7 g/dL (ref 6.4–8.3)

## 2016-10-08 LAB — MAGNESIUM: Magnesium: 1.3 mg/dl — CL (ref 1.5–2.5)

## 2016-10-08 MED ORDER — POTASSIUM CHLORIDE CRYS ER 20 MEQ PO TBCR: 20.0000 meq | EXTENDED_RELEASE_TABLET | Freq: Every day | ORAL | 0 refills | Status: DC

## 2016-10-08 MED ORDER — MAGNESIUM OXIDE 400 (241.3 MG) MG PO TABS: 400.0000 mg | ORAL_TABLET | Freq: Two times a day (BID) | ORAL | 0 refills | Status: DC

## 2016-10-09 NOTE — Progress Notes (Signed)
[name].    HEMATOLOGY/ONCOLOGY CLINIC NOTE  Date of Service: .10/08/2016  Patient Care Team: Stacey Grandchildhomas L Jones, MD as PCP - General (Internal Medicine)  CHIEF COMPLAINTS/PURPOSE OF CONSULTATION:  Anemia  HISTORY OF PRESENTING ILLNESS:   Stacey Holland is a wonderful 20 y.o. female who has been referred to us by Dr .Sanda Lingerhomas Jones, MD  for evaluation and management of Anemia.  Patient is of [place] descent and has a history of morbid obesity, polycystic ovarian syndrome (on metformin), hypertension, acid reflux  who was referred to us by Dr. Sanda Lingerhomas Holland for evaluation of anemia.  Patient notes that she weighed about 320 lbs at her heaviest. She has been on metformin and has been on a diet with smaller portions since March 2017 and has lost more than 120 pounds and notes that she is continuing to lose weight. About a month or so ago she was admitted with dehydration and renal dysfunction which improved with IV fluids. She notes that she has lost most of her desire to eat and gets nauseous with several different types of foods and can only tolerate some of them.  She has had persistent right upper quadrant abdominal pain And severe fatigue. She had a CT of the abdomen done on 09/01/2016 that showed no acute findings. Within the abdomen or pelvis and no evidence of splenomegaly or lymphadenopathy. She has a referral to gastroenterology and she'll be following up to get an EGD.  She complains of severe fatigue and issues with orthostatic hypotension. She has had issues with electrolyte abnormalities with concerns by her primary care physician for an eating disorder.  Patient reports significant amount of fatigue that is affecting her college studies . She reports that she has had what appears to be ML or rash the last few months.   she reports that she had a TB test in 2011 which was negative .  She last was interested [place] about 4 months ago .no fevers or chills no other obvious infections  .  Had a blood transfusion as an infant --primi/breech .   INTERVAL HISTORY  Ms Stacey Holland is here for her scheduled follow-up to discuss results of her workup. She notes that she is feeling better after potassium replacement and that her muscle strength is is much improved and she has been much more physically active. She had an EGD that showed gastritis but no evidence of celiac disease or other pathology. She notes that she is eating much better and has gained about 5 pounds since her last visit. She was encouraged to drink more fluids. No nausea or vomiting. Parents present for her clinic visit. We spent a significant period of time discussing her extensive workup.   MEDICAL HISTORY:  Past Medical History:  Diagnosis Date  . History of blood transfusion    prematurity, this was during perinatal period  . Hypertension   . Morbid obesity (HCC) 04/03/2016  . PCOS (polycystic ovarian syndrome) 04/03/2016  . Reflux     SURGICAL HISTORY: Past Surgical History:  Procedure Laterality Date  . ESOPHAGOGASTRODUODENOSCOPY N/A 09/26/2016   Procedure: ESOPHAGOGASTRODUODENOSCOPY (EGD);  Surgeon: Beverley FiedlerJay M Pyrtle, MD;  Location: [facility];  Service: Endoscopy;  Laterality: N/A;  . eustachion tubes      SOCIAL HISTORY: Social History   Social History  . Marital status: Single    Spouse name: N/A  . Number of children: N/A  . Years of education: N/A   Occupational History  . Not on file.   Social  History Main Topics  . Smoking status: Never Smoker  . Smokeless tobacco: Never Used  . Alcohol use No  . Drug use: No  . Sexual activity: No   Other Topics Concern  . Not on file   Social History Narrative   Studying mass communications at [employer]  No alcohol use No drug use  FAMILY HISTORY: Family History  Problem Relation Age of Onset  . GER disease Mother   . Hypertension Mother   . GER disease Father   . Hypertension Father   . Lactose intolerance Brother   .  Hypertension Maternal Grandfather   . Cancer Neg Hx   . Depression Neg Hx   . Drug abuse Neg Hx   . Early death Neg Hx   . Heart disease Neg Hx   . Hyperlipidemia Neg Hx   . Kidney disease Neg Hx   . Alcohol abuse Neg Hx   . Asthma Neg Hx   . Stroke Neg Hx   Fraternal twin has possible autoimmune condition Mother -rheumatoid arthritis    ALLERGIES:  is allergic to pineapple and other.  MEDICATIONS:  Current Outpatient Prescriptions  Medication Sig Dispense Refill  . acetaminophen (TYLENOL) 500 MG tablet Take 1,000 mg by mouth every 6 (six) hours as needed for moderate pain.    [name] metoCLOPramide (REGLAN) 5 MG tablet Take 1 tablet (5 mg total) by mouth 3 (three) times daily before meals. 90 tablet 0  . Multiple Vitamin (MULTIVITAMIN WITH MINERALS) TABS tablet Take 1 tablet by mouth daily.    . Norgestimate-Ethinyl Estradiol Triphasic (TRI-PREVIFEM) 0.18/0.215/0.25 MG-35 MCG tablet Take 1 tablet by mouth at bedtime.    [name] omeprazole (PRILOSEC) 20 MG capsule Take 20 mg by mouth at bedtime.    . polyethylene glycol (MIRALAX / GLYCOLAX) packet Take 17 g by mouth every other day.    . magnesium oxide (MAG-OX) 400 (241.3 Mg) MG tablet Take 1 tablet (400 mg total) by mouth 2 (two) times daily. 30 tablet 0  . potassium chloride SA (K-DUR,KLOR-CON) 20 MEQ tablet Take 1 tablet (20 mEq total) by mouth daily. 15 tablet 0   No current facility-administered medications for this visit.     REVIEW OF SYSTEMS:    10 Point review of Systems was done is negative except as noted above.  PHYSICAL EXAMINATION: ECOG PERFORMANCE STATUS: 1  VS stable as noted above.  GENERAL:alert, in no acute distress and comfortable SKIN: skin color, texture, turgor are normal, no rashes or significant lesions EYES: normal, conjunctiva are pink and non-injected, sclera clear OROPHARYNX:no exudate, no erythema and lips, buccal mucosa, and tongue normal  NECK: supple, no JVD, thyroid normal size, non-tender, without  nodularity LYMPH:  no palpable lymphadenopathy in the cervical, axillary or inguinal LUNGS: clear to auscultation with normal respiratory effort HEART: regular rate & rhythm,  no murmurs and no lower extremity edema ABDOMEN: abdomen soft, non-tender, normoactive bowel sounds  Musculoskeletal: no cyanosis of digits and no clubbing  PSYCH: alert & oriented x 3 with fluent speech NEURO: no focal motor/sensory deficits.  LABORATORY DATA:  I have reviewed the data as listed  . CBC Latest Ref Rng & Units 10/08/2016 09/26/2016 09/25/2016  WBC 3.9 - 10.3 10e3/uL 6.1 4.7 4.8  Hemoglobin 11.6 - 15.9 g/dL 11.5(L) 11.0(L) 11.3(L)  Hematocrit 34.8 - 46.6 % 34.9 33.8(L) 34.3(L)  Platelets 145 - 400 10e3/uL 259 259 278   . CMP Latest Ref Rng & Units 10/08/2016 09/26/2016 09/25/2016  Glucose 70 -  140 mg/dl 96 96 85  BUN 7.0 - 16.1 mg/dL 8.6 12 12   Creatinine 0.6 - 1.1 mg/dL 0.8 0.96(E) 4.54  Sodium 136 - 145 mEq/L 141 138 136  Potassium 3.5 - 5.1 mEq/L 4.1 3.5 3.3(L)  Chloride 101 - 111 mmol/L - 101 97(L)  CO2 22 - 29 mEq/L 23 29 28   Calcium 8.4 - 10.4 mg/dL 9.3 0.9(W) 9.2  Total Protein 6.4 - 8.3 g/dL 7.0 - 7.1  Total Bilirubin 0.20 - 1.20 mg/dL 1.19 - 0.8  Alkaline Phos 40 - 150 U/L 47 - 46  AST 5 - 34 U/L 18 - 26  ALT 0 - 55 U/L 17 - 31   Magnesium 1.3     RADIOGRAPHIC STUDIES: I have personally reviewed the radiological images as listed and agreed with the findings in the report. Dg Chest 2 View  Result Date: 09/18/2016 CLINICAL DATA:  Five days of nonproductive cough. History of morbid obesity, gastroesophageal reflux. EXAM: CHEST  2 VIEW COMPARISON:  PA and lateral chest x-ray of June 27, 2010 FINDINGS: The lungs are well-expanded and clear. The heart and pulmonary vascularity are normal. The mediastinum is normal in width. There is no pleural effusion. The bony thorax exhibits no acute abnormality. IMPRESSION: There is no active cardiopulmonary disease. Electronically Signed    By: David  [place] M.D.   On: 09/18/2016 07:09   Ct Chest Wo Contrast  Result Date: 09/30/2016 CLINICAL DATA:  Cough with weight loss, fatigue and elevated hemoglobin levels. No recent surgery or history of malignancy. EXAM: CT CHEST WITHOUT CONTRAST TECHNIQUE: Multidetector CT imaging of the chest was performed following the standard protocol without IV contrast. COMPARISON:  Chest radiographs 09/17/2016. Abdominal pelvic CT 09/01/2016. FINDINGS: Cardiovascular: No significant vascular findings are seen on noncontrast imaging. The heart size is normal. There is no pericardial effusion. Mediastinum/Nodes: There are no enlarged mediastinal, hilar or axillary lymph nodes.There is an 8 mm left supraclavicular node on image 14. Hilar assessment is limited by the lack of intravenous contrast, although the hilar contours appear unchanged. The thyroid gland, trachea and esophagus demonstrate no significant findings. There is small amount of residual thymic tissue in anterior mediastinal, typical for age. Lungs/Pleura: There is no pleural effusion. The lungs are clear. Upper abdomen:  Unremarkable.  There is no adrenal mass. Musculoskeletal/Chest wall: There is no chest wall mass or suspicious osseous finding. IMPRESSION: No acute or significant findings demonstrated. Electronically Signed   By: Carey Bullocks M.D.   On: 09/30/2016 12:46   CT ABDOMEN PELVIS W CONTRAST (Accession [id number]) (Order [id number])  Imaging  Date: 09/01/2016 Department: [facility] IMAGING [address] Released By: Vira Agar, NT Authorizing: Stacey Grandchild, MD  Exam Information   Status Exam Begun  Exam Ended   Final [99] 09/01/2016 2:30 PM 09/01/2016 2:39 PM  PACS Images   Show images for CT ABDOMEN PELVIS W CONTRAST  Study Result   CLINICAL DATA:  Atypical lymphocytosis. Left upper quadrant pain for several months.  EXAM: CT ABDOMEN AND PELVIS WITH CONTRAST  TECHNIQUE: Multidetector CT imaging of  the abdomen and pelvis was performed using the standard protocol following bolus administration of intravenous contrast.  CONTRAST:  ISOVUE-300 IOPAMIDOL (ISOVUE-300) INJECTION 61%  COMPARISON:  None.  FINDINGS: Lower Chest: No acute findings.  Hepatobiliary: No mass identified. Focal fatty infiltration adjacent to falciform ligament. Gallbladder is unremarkable.  Pancreas:  No mass or inflammatory changes.  Spleen: Within normal limits in size and appearance.  Adrenals/Urinary Tract:  No masses identified. No evidence of hydronephrosis.  Stomach/Bowel: No evidence of obstruction, inflammatory process or abnormal fluid collections. Normal appendix visualized.  Vascular/Lymphatic: No pathologically enlarged lymph nodes. No abdominal aortic aneurysm.  Reproductive:  No mass identified.  Other:  None.  Musculoskeletal:  No suspicious bone lesions identified.  IMPRESSION: No acute findings within the abdomen or pelvis. No evidence of splenomegaly or lymphadenopathy.   Electronically Signed   By: Myles RosenthalJohn  Stahl M.D.   On: 09/01/2016 17:05     ASSESSMENT & PLAN:   20 year old Female originally from [place] with   1) Microcytic Anemia -Mild improving.  ferritin levels are elevated to 500's. He doesn't appear to be related to iron deficiency. Appears to be anemia of chronic disease with significantly elevated inflammatory markers due to unclear etiology. B12 levels within normal limits No evidence of hemolysis with normal LDH and bilirubin Could be due to rapid weight loss from 320 pounds down to 180lbs. this can result from several things including severe protein calorie malnutrition which of severe can cause gelatinous transformation of the bone marrow. Patient does not have any other significant cytopenias at this time. Copper and zinc levels within normal limits  CT chest abdomen pelvis - no evidence of hepatosplenomegaly or lymphadenopathy in the  chest or abdomen. Normal LDH level suggests against a lymphoproliferative disorder. HIV negative Hepatitis C negative 2) lymphocytosis in peripheral blood   -Flow cytometry showed no evidence of a monoclonal B cell positive typically aberrant T-cell population. No evidence of a lymphoproliferative disorder based on absence of lymphadenopathy or hepatosplenomegaly. 3) elevated ferritin level -likely reactive due to inflammation Hemochromatosis panel negative for mutation 3) Elevated sedimentation rate and CRP- no overt evidence of malignancy on CT chest abdomen pelvis.  no overt focal symptoms suggesting specific infection . Given her significant fatigue - concern for possible autoimmune condition. Mother has a history of rheumatoid arthritis. Component     Latest Ref Rng & Units 09/23/2016  SSA (Ro) (ENA) Antibody, IgG     <1.0 NEG AI <1.0 NEG  SSB (La) (ENA) Antibody, IgG     <1.0 NEG AI <1.0 NEG  Complement C3, Serum     82 - 167 mg/dL [id number]  Complement C4, Serum     14 - 44 mg/dL 36  RA Latex Turbid.     0.0 - 13.9 IU/mL 11.2  CCP Antibodies IgG/IgA     0 - 19 units 8  ENA SM Ab Ser-aCnc     <1.0 NEG AI <1.0 NEG  Sed Rate     0 - 32 mm/hr 83 (H)  CRP     0.0 - 4.9 mg/L 50.1 (H)  ANA Ab, IFA      Negative  dsDNA Ab     0 - 9 IU/mL 1   Plan -Recommended to the patient that she take a multivitamin daily to address any unknown nutritional deficiencies. -She is eating better and her weight loss has stopped last. -Would recommend to coordinate a dietitian referral and appropriate step wise weight loss with appropriate dietary management. -In general I recommended that she increase her activity level and not significantly calorie deprive herself. -She needs to maintain adequate protein intake at least 1 g/kg ideal body weight... In her case at least about 60-70 g a day. -Would strongly recommend having a rheumatology evaluation to rule out the possibility of autoimmune  conditions/vasculitis. -get Tuberculin testing/Quantiferon testing with PCP  4) Hypokalemia , hypomagnesemia and hypercalcemia. ?persistent . Patient denies  findings of eating disorder. Consideration was poor oral intake but doesn't quite explain her hypercalcemia except with possibly some dehydration. Thyroid function tests within normal limits. PTH level within normal limits Normal vitamin D level ACE levels wnl making sarcoidosis less likely. No overt evidence of lymphoma. Component     Latest Ref Rng & Units 09/23/2016 09/23/2016 09/23/2016         3:45 PM  3:45 PM  3:45 PM  Calcium     8.7 - 10.2 mg/dL  16.1 (H)   PTH     15 - 65 pg/mL  19 Comment  Vitamin D, 25-Hydroxy     30.0 - 100.0 ng/mL 64.6    Angio Convert Enzyme     14 - 82 U/L 38     PLAN -Hypercalcemia has resolved. -She has been given a prescription for potassium and magnesium for ongoing replacement. -I discussed and ruled out any laxative or diuretic abuse. -If persistent might need nephrology evaluation to rule out other hormonal or renal disorders such as mild case of Bartter/Gitelman syndrome, hyperaldo etc.. -Will defer additional electrolyte replacement and workup to primary care physician.  -Continue close follow-up with primary care physician .Sanda Linger, MD in 2-3 weeks.  Return to clinic with Dr. Candise Che in 3 months with rpt labs to check for evolution of any other concerning hematological findings.   Orders Placed This Encounter  Procedures  . CBC & Diff and Retic    Standing Status:   Future    Standing Expiration Date:   10/08/2017  . Comprehensive metabolic panel    Standing Status:   Future    Standing Expiration Date:   10/08/2017  . Magnesium    Standing Status:   Future    Standing Expiration Date:   10/08/2017  . Sedimentation rate    Standing Status:   Future    Standing Expiration Date:   10/08/2017  . C-reactive protein    Standing Status:   Future    Standing Expiration Date:    10/08/2017    All of the patients questions were answered with apparent satisfaction. The patient knows to call the clinic with any problems, questions or concerns.  I spent 35 minutes counseling the patient face to face. The total time spent in the appointment was 40 minutes and more than 50% was on counseling and direct patient cares.    Wyvonnia Lora MD MS AAHIVMS [facility] Hematology/Oncology Physician [facility]  (Office):       [phone] (Work cell):  [phone] (Fax):           [phone]

## 2016-10-12 ENCOUNTER — Ambulatory Visit (INDEPENDENT_AMBULATORY_CARE_PROVIDER_SITE_OTHER): Payer: BLUE CROSS/BLUE SHIELD | Admitting: Family

## 2016-10-12 ENCOUNTER — Encounter: Payer: Self-pay | Admitting: Family

## 2016-10-12 DIAGNOSIS — J069 Acute upper respiratory infection, unspecified: Secondary | ICD-10-CM | POA: Insufficient documentation

## 2016-10-12 MED ORDER — HYDROCODONE-HOMATROPINE 5-1.5 MG/5ML PO SYRP: 5.0000 mL | ORAL_SOLUTION | Freq: Three times a day (TID) | ORAL | 0 refills | Status: DC | PRN

## 2016-10-12 NOTE — Patient Instructions (Signed)
Thank you for choosing [facility].  SUMMARY AND INSTRUCTIONS:  Medication:  Your prescription(s) have been submitted to your pharmacy or been printed and provided for you. Please take as directed and contact our office if you believe you are having problem(s) with the medication(s) or have any questions.   Follow up:  If your symptoms worsen or fail to improve, please contact our office for further instruction, or in case of emergency go directly to the emergency room at the closest medical facility.     Cough, Adult Coughing is a reflex that clears your throat and your airways. Coughing helps to heal and protect your lungs. It is normal to cough occasionally, but a cough that happens with other symptoms or lasts a long time may be a sign of a condition that needs treatment. A cough may last only 2-3 weeks (acute), or it may last longer than 8 weeks (chronic). What are the causes? Coughing is commonly caused by:  Breathing in substances that irritate your lungs.  A viral or bacterial respiratory infection.  Allergies.  Asthma.  Postnasal drip.  Smoking.  Acid backing up from the stomach into the esophagus (gastroesophageal reflux).  Certain medicines.  Chronic lung problems, including COPD (or rarely, lung cancer).  Other medical conditions such as heart failure. Follow these instructions at home: Pay attention to any changes in your symptoms. Take these actions to help with your discomfort:  Take medicines only as told by your health care provider.  If you were prescribed an antibiotic medicine, take it as told by your health care provider. Do not stop taking the antibiotic even if you start to feel better.  Talk with your health care provider before you take a cough suppressant medicine.  Drink enough fluid to keep your urine clear or pale yellow.  If the air is dry, use a cold steam vaporizer or humidifier in your bedroom or your home to help loosen  secretions.  Avoid anything that causes you to cough at work or at home.  If your cough is worse at night, try sleeping in a semi-upright position.  Avoid cigarette smoke. If you smoke, quit smoking. If you need help quitting, ask your health care provider.  Avoid caffeine.  Avoid alcohol.  Rest as needed. Contact a health care provider if:  You have new symptoms.  You cough up pus.  Your cough does not get better after 2-3 weeks, or your cough gets worse.  You cannot control your cough with suppressant medicines and you are losing sleep.  You develop pain that is getting worse or pain that is not controlled with pain medicines.  You have a fever.  You have unexplained weight loss.  You have night sweats. Get help right away if:  You cough up blood.  You have difficulty breathing.  Your heartbeat is very fast. This information is not intended to replace advice given to you by your health care provider. Make sure you discuss any questions you have with your health care provider. Document Released: 04/24/2011 Document Revised: 04/02/2016 Document Reviewed: 01/02/2015 Elsevier Interactive Patient Education  2017 [employer]

## 2016-10-12 NOTE — Progress Notes (Signed)
Subjective:    Patient ID: Stacey Holland, female    DOB: September 10, 1996, 20 y.o.   MRN: [id number]  Chief Complaint  Patient presents with  . Cough    has had a cough for 4 days that has not went away, has been taking codiene cough syrup that she would like refilled    HPI:  Stacey Abideuha Z Mynhier is a 20 y.o. female who  has a past medical history of History of blood transfusion; Hypertension; Morbid obesity (HCC) (04/03/2016); PCOS (polycystic ovarian syndrome) (04/03/2016); and Reflux. and presents today for an acute office visit.  This is a a new problem. Associated symptom of a cough that has been going on for about 4 days. No fevers. Modifying factors include Hycodan which has helped. Course of the symptoms has stayed about the same since initial onset.  No recent antibiotic use.    Allergies  Allergen Reactions  . Pineapple Other (See Comments)    Reaction:  Burning   . Other Itching, Swelling, Rash and Other (See Comments)    Reaction:  Burning Pt states that she is allergic to all hair dye.     Current Outpatient Prescriptions  Medication Sig Dispense Refill  . acetaminophen (TYLENOL) 500 MG tablet Take 1,000 mg by mouth every 6 (six) hours as needed for moderate pain.    . magnesium oxide (MAG-OX) 400 (241.3 Mg) MG tablet Take 1 tablet (400 mg total) by mouth 2 (two) times daily. 30 tablet 0  . metoCLOPramide (REGLAN) 5 MG tablet Take 1 tablet (5 mg total) by mouth 3 (three) times daily before meals. 90 tablet 0  . Multiple Vitamin (MULTIVITAMIN WITH MINERALS) TABS tablet Take 1 tablet by mouth daily.    . Norgestimate-Ethinyl Estradiol Triphasic (TRI-PREVIFEM) 0.18/0.215/0.25 MG-35 MCG tablet Take 1 tablet by mouth at bedtime.    [name]. omeprazole (PRILOSEC) 20 MG capsule Take 20 mg by mouth at bedtime.    . polyethylene glycol (MIRALAX / GLYCOLAX) packet Take 17 g by mouth every other day.    . potassium chloride SA (K-DUR,KLOR-CON) 20 MEQ tablet Take 1 tablet (20 mEq total) by mouth  daily. 15 tablet 0  . HYDROcodone-homatropine (HYCODAN) 5-1.5 MG/5ML syrup Take 5 mLs by mouth every 8 (eight) hours as needed for cough. 180 mL 0   No current facility-administered medications for this visit.      Review of Systems  Constitutional: Negative for chills and fever.  HENT: Negative for congestion, ear pain, sinus pain and sore throat.   Respiratory: Positive for cough. Negative for chest tightness and wheezing.   Neurological: Negative for headaches.      Objective:    BP 132/82 (BP Location: Left Arm, Patient Position: Sitting, Cuff Size: Normal)   Pulse 69   Temp 98.2 F (36.8 C)   Resp 16   Ht 5' 4.75" (1.645 m)   Wt 181 lb (82.1 kg)   LMP 09/22/2016   SpO2 98%   BMI 30.35 kg/m  Nursing note and vital signs reviewed.  Physical Exam  Constitutional: She is oriented to person, place, and time. She appears well-developed and well-nourished.  Non-toxic appearance. She does not have a sickly appearance. She does not appear ill. No distress.  HENT:  Right Ear: Hearing, tympanic membrane, external ear and ear canal normal.  Left Ear: Hearing, tympanic membrane, external ear and ear canal normal.  Nose: Nose normal.  Mouth/Throat: Uvula is midline, oropharynx is clear and moist and mucous membranes are normal.  Cardiovascular: Normal rate, regular rhythm, normal heart sounds and intact distal pulses.   Pulmonary/Chest: Effort normal and breath sounds normal. No respiratory distress. She has no wheezes. She has no rales. She exhibits no tenderness.  Neurological: She is alert and oriented to person, place, and time.  Skin: Skin is warm and dry.  Psychiatric: She has a normal mood and affect. Her behavior is normal. Judgment and thought content normal.       Assessment & Plan:   Problem List Items Addressed This Visit      Respiratory   Acute upper respiratory infection    Symptoms and exam consistent with acute upper respiratory infection. Continue OTC  medications as needed for symptom relief and supportive care. Refill Hycodan. Follow up if symptoms worsen or do not improve.      Relevant Medications   HYDROcodone-homatropine (HYCODAN) 5-1.5 MG/5ML syrup       I am having Ms. Sedler start on HYDROcodone-homatropine. I am also having her maintain her omeprazole, Norgestimate-Ethinyl Estradiol Triphasic, multivitamin with minerals, acetaminophen, metoCLOPramide, polyethylene glycol, potassium chloride SA, and magnesium oxide.   Meds ordered this encounter  Medications  . HYDROcodone-homatropine (HYCODAN) 5-1.5 MG/5ML syrup    Sig: Take 5 mLs by mouth every 8 (eight) hours as needed for cough.    Dispense:  180 mL    Refill:  0    Order Specific Question:   Supervising Provider    Answer:   Hillard DankerRAWFORD, ELIZABETH A [[id number]]     Follow-up: Return if symptoms worsen or fail to improve.  Jeanine Luzalone, Gregory, FNP

## 2016-10-12 NOTE — Assessment & Plan Note (Signed)
Symptoms and exam consistent with acute upper respiratory infection. Continue OTC medications as needed for symptom relief and supportive care. Refill Hycodan. Follow up if symptoms worsen or do not improve.

## 2016-10-15 ENCOUNTER — Ambulatory Visit (INDEPENDENT_AMBULATORY_CARE_PROVIDER_SITE_OTHER): Payer: BLUE CROSS/BLUE SHIELD | Admitting: Internal Medicine

## 2016-10-15 ENCOUNTER — Encounter: Payer: Self-pay | Admitting: Internal Medicine

## 2016-10-15 VITALS — BP 120/70 | HR 95 | Temp 99.5°F | Resp 20 | Wt 178.0 lb

## 2016-10-15 DIAGNOSIS — R739 Hyperglycemia, unspecified: Secondary | ICD-10-CM | POA: Diagnosis not present

## 2016-10-15 DIAGNOSIS — J069 Acute upper respiratory infection, unspecified: Secondary | ICD-10-CM

## 2016-10-15 DIAGNOSIS — I10 Essential (primary) hypertension: Secondary | ICD-10-CM

## 2016-10-15 DIAGNOSIS — J019 Acute sinusitis, unspecified: Secondary | ICD-10-CM | POA: Insufficient documentation

## 2016-10-15 MED ORDER — AZITHROMYCIN 250 MG PO TABS: ORAL_TABLET | ORAL | 1 refills | Status: DC

## 2016-10-15 MED ORDER — HYDROCOD POLST-CPM POLST ER 10-8 MG/5ML PO SUER: 5.0000 mL | Freq: Two times a day (BID) | ORAL | 0 refills | Status: DC | PRN

## 2016-10-15 MED ORDER — BENZONATATE 100 MG PO CAPS: ORAL_CAPSULE | ORAL | 1 refills | Status: DC

## 2016-10-15 NOTE — Assessment & Plan Note (Signed)
Mild to mod, for antibx course,  to f/u any worsening symptoms or concerns 

## 2016-10-15 NOTE — Progress Notes (Signed)
Subjective:    Patient ID: Stacey Holland, female    DOB: 05-06-1996, 20 y.o.   MRN: [id number]  HPI  Here with 8 days acute onset fever, facial pain, pressure, headache, general weakness and malaise, and greenish d/c, with mild ST and cough, but pt denies chest pain, wheezing, increased sob or doe, orthopnea, PND, increased LE swelling, palpitations, dizziness or syncope.  Pt denies new neurological symptoms such as new headache, or facial or extremity weakness or numbness   Pt denies polydipsia, polyuria, No other history changes Past Medical History:  Diagnosis Date  . History of blood transfusion    prematurity, this was during perinatal period  . Hypertension   . Morbid obesity (HCC) 04/03/2016  . PCOS (polycystic ovarian syndrome) 04/03/2016  . Reflux    Past Surgical History:  Procedure Laterality Date  . ESOPHAGOGASTRODUODENOSCOPY N/A 09/26/2016   Procedure: ESOPHAGOGASTRODUODENOSCOPY (EGD);  Surgeon: Beverley FiedlerJay M Pyrtle, MD;  Location: [facility];  Service: Endoscopy;  Laterality: N/A;  . eustachion tubes      reports that she has never smoked. She has never used smokeless tobacco. She reports that she does not drink alcohol or use drugs. family history includes GER disease in her father and mother; Hypertension in her father, maternal grandfather, and mother; Lactose intolerance in her brother. Allergies  Allergen Reactions  . Pineapple Other (See Comments)    Reaction:  Burning   . Other Itching, Swelling, Rash and Other (See Comments)    Reaction:  Burning Pt states that she is allergic to all hair dye.     Current Outpatient Prescriptions on File Prior to Visit  Medication Sig Dispense Refill  . acetaminophen (TYLENOL) 500 MG tablet Take 1,000 mg by mouth every 6 (six) hours as needed for moderate pain.    . magnesium oxide (MAG-OX) 400 (241.3 Mg) MG tablet Take 1 tablet (400 mg total) by mouth 2 (two) times daily. 30 tablet 0  . metoCLOPramide (REGLAN) 5 MG tablet Take 1  tablet (5 mg total) by mouth 3 (three) times daily before meals. 90 tablet 0  . Multiple Vitamin (MULTIVITAMIN WITH MINERALS) TABS tablet Take 1 tablet by mouth daily.    . Norgestimate-Ethinyl Estradiol Triphasic (TRI-PREVIFEM) 0.18/0.215/0.25 MG-35 MCG tablet Take 1 tablet by mouth at bedtime.    [name]. omeprazole (PRILOSEC) 20 MG capsule Take 20 mg by mouth at bedtime.    . polyethylene glycol (MIRALAX / GLYCOLAX) packet Take 17 g by mouth every other day.    . potassium chloride SA (K-DUR,KLOR-CON) 20 MEQ tablet Take 1 tablet (20 mEq total) by mouth daily. 15 tablet 0   No current facility-administered medications on file prior to visit.    Review of Systems  Constitutional: Negative for unusual diaphoresis or night sweats HENT: Negative for ear swelling or discharge Eyes: Negative for worsening visual haziness  Respiratory: Negative for choking and stridor.   Gastrointestinal: Negative for distension or worsening eructation Genitourinary: Negative for retention or change in urine volume.  Musculoskeletal: Negative for other MSK pain or swelling Skin: Negative for color change and worsening wound Neurological: Negative for tremors and numbness other than noted  Psychiatric/Behavioral: Negative for decreased concentration or agitation other than above   All other system neg per pt    Objective:   Physical Exam BP 120/70   Pulse 95   Temp 99.5 F (37.5 C) (Oral)   Resp 20   Wt 178 lb (80.7 kg)   LMP 09/22/2016   SpO2 98%  BMI 29.85 kg/m  VS noted, mild ill Constitutional: Pt appears in no apparent distress HENT: Head: NCAT.  Right Ear: External ear normal.  Left Ear: External ear normal.  Eyes: . Pupils are equal, round, and reactive to light. Conjunctivae and EOM are normal Bilat tm's with mild erythema.  Max sinus areas mild tender.  Pharynx with mild erythema, no exudate Neck: Normal range of motion. Neck supple.  Cardiovascular: Normal rate and regular rhythm.     Pulmonary/Chest: Effort normal and breath sounds without rales or wheezing.  Neurological: Pt is alert. Not confused , motor grossly intact Skin: Skin is warm. No rash, no LE edema Psychiatric: Pt behavior is normal. No agitation.     Assessment & Plan:

## 2016-10-15 NOTE — Assessment & Plan Note (Signed)
Asympt, stable overall by history and exam, recent data reviewed with pt, and pt to continue medical treatment as before,  to f/u any worsening symptoms or concerns Lab Results  Component Value Date   HGBA1C 5.2 07/27/2016

## 2016-10-15 NOTE — Patient Instructions (Signed)
Please take all new medication as prescribed - the antibiotic, cough medicine (the liquid, as well as the pills if needed)  Please continue all other medications as before, and refills have been done if requested.  Please have the pharmacy call with any other refills you may need.  Please keep your appointments with your specialists as you may have planned

## 2016-10-15 NOTE — Progress Notes (Signed)
Pre visit review using our clinic review tool, if applicable. No additional management support is needed unless otherwise documented below in the visit note. 

## 2016-10-15 NOTE — Assessment & Plan Note (Signed)
stable overall by history and exam, recent data reviewed with pt, and pt to continue medical treatment as before,  to f/u any worsening symptoms or concerns BP Readings from Last 3 Encounters:  10/15/16 120/70  10/12/16 132/82  09/26/16 118/74

## 2016-10-19 ENCOUNTER — Telehealth: Payer: Self-pay | Admitting: General Practice

## 2016-10-19 ENCOUNTER — Other Ambulatory Visit: Payer: Self-pay | Admitting: Hematology

## 2016-10-19 NOTE — Telephone Encounter (Signed)
Spoke with pt confirmed Feb 2018 appts. 

## 2016-10-22 ENCOUNTER — Ambulatory Visit (INDEPENDENT_AMBULATORY_CARE_PROVIDER_SITE_OTHER): Payer: BLUE CROSS/BLUE SHIELD | Admitting: Nurse Practitioner

## 2016-10-22 ENCOUNTER — Other Ambulatory Visit (INDEPENDENT_AMBULATORY_CARE_PROVIDER_SITE_OTHER): Payer: BLUE CROSS/BLUE SHIELD

## 2016-10-22 ENCOUNTER — Encounter: Payer: Self-pay | Admitting: Nurse Practitioner

## 2016-10-22 ENCOUNTER — Ambulatory Visit (INDEPENDENT_AMBULATORY_CARE_PROVIDER_SITE_OTHER)
Admission: RE | Admit: 2016-10-22 | Discharge: 2016-10-22 | Disposition: A | Discharge status: Home / Self Care | Payer: BLUE CROSS/BLUE SHIELD | Source: Ambulatory Visit | Attending: Nurse Practitioner | Admitting: Nurse Practitioner

## 2016-10-22 VITALS — BP 112/84 | HR 112 | Temp 98.5°F | Ht 64.0 in | Wt 175.0 lb

## 2016-10-22 DIAGNOSIS — M791 Myalgia, unspecified site: Secondary | ICD-10-CM

## 2016-10-22 DIAGNOSIS — R0602 Shortness of breath: Secondary | ICD-10-CM

## 2016-10-22 DIAGNOSIS — R5381 Other malaise: Secondary | ICD-10-CM | POA: Diagnosis not present

## 2016-10-22 DIAGNOSIS — R531 Weakness: Secondary | ICD-10-CM | POA: Diagnosis not present

## 2016-10-22 DIAGNOSIS — E878 Other disorders of electrolyte and fluid balance, not elsewhere classified: Secondary | ICD-10-CM

## 2016-10-22 DIAGNOSIS — R8290 Unspecified abnormal findings in urine: Secondary | ICD-10-CM

## 2016-10-22 LAB — URINALYSIS, ROUTINE W REFLEX MICROSCOPIC
LEUKOCYTES UA: NEGATIVE
Nitrite: NEGATIVE
Specific Gravity, Urine: >=1.03 — AB (ref 1.000–1.030)
TOTAL PROTEIN, URINE-UPE24: 30 — AB
URINE GLUCOSE: NEGATIVE
UROBILINOGEN UA: 0.2 (ref 0.0–1.0)
pH: 5.5 (ref 5.0–8.0)

## 2016-10-22 LAB — C-REACTIVE PROTEIN: CRP: 0.1 mg/dL — AB (ref 0.5–20.0)

## 2016-10-22 LAB — MAGNESIUM: Magnesium: 1.4 mg/dL — ABNORMAL LOW (ref 1.5–2.5)

## 2016-10-22 LAB — TSH: TSH: 0.44 u[IU]/mL (ref 0.35–5.50)

## 2016-10-22 LAB — SEDIMENTATION RATE: Sed Rate: 42 mm/hr — ABNORMAL HIGH (ref 0–20)

## 2016-10-22 NOTE — Progress Notes (Signed)
Pre visit review using our clinic review tool, if applicable. No additional management support is needed unless otherwise documented below in the visit note. 

## 2016-10-22 NOTE — Progress Notes (Signed)
Normal results, see office note

## 2016-10-22 NOTE — Patient Instructions (Addendum)
Entered referral to rheumatologist. Repeat BMP and urinalysis on Monday. Patient is to return to lab for specimen container to collect 24-hour urine for cortisol.  Workup to rule out adrenal gland insufficiency versus lupus. Family history of rheumatoid arthritis (mother).

## 2016-10-22 NOTE — Progress Notes (Signed)
Subjective:  Patient ID: Stacey Holland, female    DOB: 1996-08-26  Age: 20 y.o. MRN: [id number]  CC: Muscle Pain (muscle and joint discomfort,SOB at times for 3 days. took tylanol. )   Muscle Pain  This is a new problem. The current episode started in the past 7 days. The problem occurs constantly. The problem is unchanged. The context of the pain is unknown. Pain location: generalized. Nothing aggravates the symptoms. Associated symptoms include shortness of breath. Pertinent negatives include no abdominal pain, chest pain, constipation, diarrhea, dysuria, fatigue, fever, headaches, joint swelling, stiffness, sensory change, swollen glands, urinary symptoms, vaginal discharge, visual change, vomiting, weakness or wheezing. Past treatments include acetaminophen. The treatment provided no relief. There is no swelling present. She has been behaving normally. There is no history of chronic back pain, rheumatic disease or sickle cell disease.    Outpatient Medications Prior to Visit  Medication Sig Dispense Refill  . acetaminophen (TYLENOL) 500 MG tablet Take 1,000 mg by mouth every 6 (six) hours as needed for moderate pain.    . magnesium oxide (MAG-OX) 400 (241.3 Mg) MG tablet Take 1 tablet (400 mg total) by mouth 2 (two) times daily. 30 tablet 0  . metoCLOPramide (REGLAN) 5 MG tablet Take 1 tablet (5 mg total) by mouth 3 (three) times daily before meals. 90 tablet 0  . Multiple Vitamin (MULTIVITAMIN WITH MINERALS) TABS tablet Take 1 tablet by mouth daily.    . Norgestimate-Ethinyl Estradiol Triphasic (TRI-PREVIFEM) 0.18/0.215/0.25 MG-35 MCG tablet Take 1 tablet by mouth at bedtime.    [name] omeprazole (PRILOSEC) 20 MG capsule Take 20 mg by mouth at bedtime.    . polyethylene glycol (MIRALAX / GLYCOLAX) packet Take 17 g by mouth every other day.    . potassium chloride SA (K-DUR,KLOR-CON) 20 MEQ tablet Take 1 tablet (20 mEq total) by mouth daily. 15 tablet 0  . benzonatate (TESSALON) 100 MG capsule  1-2 tab by mouth three times per day as needed for cough 60 capsule 1  . azithromycin (ZITHROMAX Z-PAK) 250 MG tablet 2 tab by mouth day 1, then 1 per day (Patient not taking: Reported on 10/22/2016) 6 tablet 1  . chlorpheniramine-HYDROcodone (TUSSIONEX PENNKINETIC ER) 10-8 MG/5ML SUER Take 5 mLs by mouth every 12 (twelve) hours as needed for cough. (Patient not taking: Reported on 10/22/2016) 180 mL 0   No facility-administered medications prior to visit.     ROS See HPI  Objective:  BP 112/84   Pulse (!) 112   Temp 98.5 F (36.9 C)   Ht '5\' 4"'$  (1.626 m)   Wt 175 lb (79.4 kg)   LMP 09/18/2016 (Approximate)   SpO2 96%   BMI 30.04 kg/m   BP Readings from Last 3 Encounters:  10/22/16 112/84  10/15/16 120/70  10/12/16 132/82    Wt Readings from Last 3 Encounters:  10/22/16 175 lb (79.4 kg)  10/15/16 178 lb (80.7 kg)  10/12/16 181 lb (82.1 kg)    Physical Exam  Constitutional: She is oriented to person, place, and time. No distress.  HENT:  Right Ear: External ear normal.  Left Ear: External ear normal.  Nose: Nose normal.  Mouth/Throat: No oropharyngeal exudate or posterior oropharyngeal edema.  Eyes: No scleral icterus.  Neck: Normal range of motion. Neck supple.  Cardiovascular: Normal rate, regular rhythm and normal heart sounds.   Pulmonary/Chest: Effort normal and breath sounds normal. No respiratory distress.  Abdominal: Soft. She exhibits no distension.  Musculoskeletal: Normal range of motion.  She exhibits no edema.  Lymphadenopathy:    She has no cervical adenopathy.  Neurological: She is alert and oriented to person, place, and time.  Skin: Skin is warm and dry.  Psychiatric: She has a normal mood and affect. Her behavior is normal.    Lab Results  Component Value Date   WBC 5.2 10/22/2016   HGB 12.1 10/22/2016   HCT 36.3 10/22/2016   PLT 231.0 10/22/2016   GLUCOSE 96 10/08/2016   CHOL 196 07/21/2016   TRIG 209 (H) 07/21/2016   HDL 39 (L)  07/21/2016   LDLCALC 115 (H) 07/21/2016   ALT 17 10/08/2016   AST 18 10/08/2016   NA 141 10/08/2016   K 4.1 10/08/2016   CL 101 09/26/2016   CREATININE 0.8 10/08/2016   BUN 8.6 10/08/2016   CO2 23 10/08/2016   TSH 0.44 10/22/2016   HGBA1C 5.2 07/27/2016    No results found.  Assessment & Plan:   [name] was seen today for muscle pain.  Diagnoses and all orders for this visit:  Myalgia -     Urinalysis, Routine w reflex microscopic; Future -     CBC w/Diff; Future -     Magnesium; Future -     Sed Rate (ESR); Future -     C-reactive protein; Future -     TSH; Future -     Ambulatory referral to Rheumatology -     Cortisol, urine, 24 hour; Future -     methocarbamol (ROBAXIN) 500 MG tablet; Take 1 tablet (500 mg total) by mouth every 8 (eight) hours as needed for muscle spasms. -     Basic metabolic panel; Future -     Urinalysis, Routine w reflex microscopic; Future  Malaise -     Urinalysis, Routine w reflex microscopic; Future -     CBC w/Diff; Future -     Magnesium; Future -     Sed Rate (ESR); Future -     C-reactive protein; Future -     TSH; Future -     Ambulatory referral to Rheumatology -     Cortisol, urine, 24 hour; Future  SOB (shortness of breath) -     DG Chest 2 View; Future  Weakness generalized -     Ambulatory referral to Rheumatology -     Cortisol, urine, 24 hour; Future -     Basic metabolic panel; Future -     Urinalysis, Routine w reflex microscopic; Future  Abnormal finding on urinalysis -     Cortisol, urine, 24 hour; Future -     Basic metabolic panel; Future -     Urinalysis, Routine w reflex microscopic; Future  Electrolyte imbalance -     Cortisol, urine, 24 hour; Future -     Basic metabolic panel; Future -     Urinalysis, Routine w reflex microscopic; Future   I have discontinued Ms. Bochicchio's azithromycin, chlorpheniramine-HYDROcodone, and benzonatate. I am also having her start on methocarbamol. Additionally, I am having  her maintain her omeprazole, Norgestimate-Ethinyl Estradiol Triphasic, multivitamin with minerals, acetaminophen, metoCLOPramide, polyethylene glycol, potassium chloride SA, and magnesium oxide.  Meds ordered this encounter  Medications  . methocarbamol (ROBAXIN) 500 MG tablet    Sig: Take 1 tablet (500 mg total) by mouth every 8 (eight) hours as needed for muscle spasms.    Dispense:  30 tablet    Refill:  0    Order Specific Question:   Supervising Provider  Answer:   Cassandria Anger [[id number]]   Recent Results (from the past 2160 hour(s))  Basic Metabolic Panel (BMET)     Status: None   Collection Time: 07/27/16  8:51 AM  Result Value Ref Range   Sodium 141 135 - 145 mEq/L   Potassium 3.6 3.5 - 5.1 mEq/L   Chloride 103 96 - 112 mEq/L   CO2 29 19 - 32 mEq/L   Glucose, Bld 77 70 - 99 mg/dL   BUN 8 6 - 23 mg/dL   Creatinine, Ser 0.84 0.40 - 1.20 mg/dL   Calcium 9.1 8.4 - 10.5 mg/dL   GFR 91.29 >60.00 mL/min  Hemoglobin A1c     Status: None   Collection Time: 07/27/16  8:51 AM  Result Value Ref Range   Hgb A1c MFr Bld 5.2 4.6 - 6.5 %    Comment: Glycemic Control Guidelines for People with Diabetes:Non Diabetic:  <6%Goal of Therapy: <7%Additional Action Suggested:  >8%   IBC panel     Status: None   Collection Time: 08/25/16 10:18 AM  Result Value Ref Range   Iron 108 42 - 145 ug/dL   Transferrin 264.0 212.0 - 360.0 mg/dL   Saturation Ratios 29.2 20.0 - 50.0 %  CBC with Differential/Platelet     Status: Abnormal   Collection Time: 08/25/16 10:18 AM  Result Value Ref Range   WBC 6.1 4.5 - 10.5 K/uL   RBC 5.42 (H) 3.87 - 5.11 Mil/uL   Hemoglobin 14.1 12.0 - 15.0 g/dL   HCT 41.6 36.0 - 46.0 %   MCV 76.8 (L) 78.0 - 100.0 fl   MCHC 34.0 30.0 - 36.0 g/dL   RDW 12.8 11.5 - 14.6 %   Platelets 291.0 150.0 - 400.0 K/uL   Neutrophils Relative % 21.5 (L) 43.0 - 77.0 %   Lymphocytes Relative 63.3 Repeated and verified X2. (H) 12.0 - 46.0 %   Monocytes Relative 12.9 (H) 3.0 - 12.0  %   Eosinophils Relative 1.3 0.0 - 5.0 %   Basophils Relative 1.0 0.0 - 3.0 %   Neutro Abs 1.3 (L) 1.4 - 7.7 K/uL   Lymphs Abs 3.8 0.7 - 4.0 K/uL   Monocytes Absolute 0.8 0.1 - 1.0 K/uL   Eosinophils Absolute 0.1 0.0 - 0.7 K/uL   Basophils Absolute 0.1 0.0 - 0.1 K/uL  Vitamin B12     Status: Abnormal   Collection Time: 08/25/16 10:18 AM  Result Value Ref Range   Vitamin B-12 1,311 (H) 211 - 911 pg/mL  Reticulocytes     Status: Abnormal   Collection Time: 08/25/16 10:18 AM  Result Value Ref Range   Retic Ct Pct 0.9 %   RBC. 5.30 (H) 3.80 - 5.10 MIL/uL   ABS Retic 47,700 20,000 - 80,000 cells/uL  Folate     Status: None   Collection Time: 08/25/16 10:18 AM  Result Value Ref Range   Folate >23.2 >5.9 ng/mL  Ferritin     Status: Abnormal   Collection Time: 08/25/16 10:18 AM  Result Value Ref Range   Ferritin 578.5 (H) 10.0 - 291.0 ng/mL  Methylmalonic acid, serum     Status: None   Collection Time: 08/25/16 10:18 AM  Result Value Ref Range   Methylmalonic Acid, Quant 155 87 - 318 nmol/L  CBC with Differential/Platelet     Status: Abnormal   Collection Time: 09/17/16  5:12 PM  Result Value Ref Range   WBC 6.8 4.5 - 10.5 K/uL    Comment: Manual  smear review agrees with instrument differential.   RBC 5.24 (H) 3.87 - 5.11 Mil/uL   Hemoglobin 13.6 12.0 - 15.0 g/dL   HCT 40.3 36.0 - 46.0 %   MCV 76.7 (L) 78.0 - 100.0 fl   MCHC 33.7 30.0 - 36.0 g/dL   RDW 12.9 11.5 - 14.6 %   Platelets 347.0 150.0 - 400.0 K/uL   Neutrophils Relative % 26.8 (L) 43.0 - 77.0 %   Lymphocytes Relative 60.0 Repeated and verified X2. (H) 12.0 - 46.0 %   Monocytes Relative 10.0 3.0 - 12.0 %   Eosinophils Relative 2.0 0.0 - 5.0 %   Basophils Relative 1.2 0.0 - 3.0 %   Neutro Abs 1.8 1.4 - 7.7 K/uL   Lymphs Abs 4.1 (H) 0.7 - 4.0 K/uL   Monocytes Absolute 0.7 0.1 - 1.0 K/uL   Eosinophils Absolute 0.1 0.0 - 0.7 K/uL   Basophils Absolute 0.1 0.0 - 0.1 K/uL  Comprehensive metabolic panel     Status:  Abnormal   Collection Time: 09/17/16  5:12 PM  Result Value Ref Range   Sodium 137 135 - 145 mEq/L   Potassium 3.0 (L) 3.5 - 5.1 mEq/L   Chloride 90 (L) 96 - 112 mEq/L   CO2 30 19 - 32 mEq/L   Glucose, Bld 75 70 - 99 mg/dL   BUN 17 6 - 23 mg/dL   Creatinine, Ser 1.14 0.40 - 1.20 mg/dL   Total Bilirubin 0.6 0.2 - 1.2 mg/dL   Alkaline Phosphatase 64 39 - 117 U/L   AST 48 (H) 0 - 37 U/L   ALT 71 (H) 0 - 35 U/L   Total Protein 8.6 (H) 6.0 - 8.3 g/dL   Albumin 4.1 3.5 - 5.2 g/dL   Calcium 10.6 (H) 8.4 - 10.5 mg/dL   GFR 64.08 >60.00 mL/min  Thyroid Panel With TSH     Status: None   Collection Time: 09/17/16  5:12 PM  Result Value Ref Range   T4, Total CANCELED 4.5 - 12.0 ug/dL    Comment: Result canceled by the ancillary   T3 Uptake CANCELED 22 - 35 %    Comment: Result canceled by the ancillary   Free Thyroxine Index CANCELED 1.4 - 3.8    Comment: Result canceled by the ancillary   TSH CANCELED mIU/L    Comment:   Reference Range   > or = 20 Years  0.40-4.50   Pregnancy Range First trimester  0.26-2.66 Second trimester 0.55-2.73 Third trimester  0.43-2.91    Result canceled by the ancillary   Cortisol     Status: None   Collection Time: 09/17/16  5:12 PM  Result Value Ref Range   Cortisol, Plasma 8.8 ug/dL    Comment: AM:  4.3 - 22.4 ug/dLPM:  3.1 - 16.7 ug/dL  Acetylcholine receptor, binding     Status: None   Collection Time: 09/17/16  5:12 PM  Result Value Ref Range   A CHR BINDING ABS <0.30 <=0.30 nmol/L    Comment:                          Reference Range:                          Negative: <=0.30 nmol/L                         Equivocal: 0.31-0.49 nmol/L  Positive: >=0.50 nmol/L   Striated muscle antibody     Status: None   Collection Time: 09/17/16  5:12 PM  Result Value Ref Range   Striated Muscle Ab CANCELED     Comment: Result canceled by the ancillary  ACTH     Status: None   Collection Time: 09/17/16  5:12 PM  Result Value  Ref Range   C206 ACTH 9 6 - 50 pg/mL    Comment: Reference range applies only to specimens collected between 7am-10am   hCG, quantitative, pregnancy     Status: None   Collection Time: 09/17/16  5:12 PM  Result Value Ref Range   Quantitative HCG 0.12 mIU/ml    Comment: Non-Pregnant Females >75yrand <437yr.0-0.6(mIU/ml)Non-Pregnant Females >4040yr.0-3.1(mIU/ml)Non-Pregnant Females Post-Menopause0.1-11.6(mIU/ml)  Copper, serum     Status: Abnormal   Collection Time: 09/22/16  5:06 PM  Result Value Ref Range   Copper 189 (H) 72 - 166 ug/dL    Comment:                                                Detection Limit = 5  Zinc     Status: None   Collection Time: 09/22/16  5:06 PM  Result Value Ref Range   Zinc, Plasma or Serum 82 56 - 134 ug/dL    Comment:                                                Detection Limit = 5  Antiextractable Nuclear Ag     Status: None   Collection Time: 09/23/16  3:43 PM  Result Value Ref Range   ENA RNP Ab <0.2 0.0 - 0.9 AI   Smith Antibodies <0.2 0.0 - 0.9 AI  Carnitine / acylcarnitine profile, bld     Status: Abnormal   Collection Time: 09/23/16  3:43 PM  Result Value Ref Range   Carnitine, Total 56 27 - 73 umol/L    Comment:                              **Please note reference interval change**   Carnitine, Free 23 20 - 55 umol/L    Comment:                              **Please note reference interval change**   Carnitine, Esterfied/Free 1.4 (H) 0.0 - 0.9 Ratio    Comment:                              **Please note reference interval change**   Disclaimer Comment     Comment: This test was developed and its performance characteristics determined by [facility]. It has not been cleared or approved by the Food and Drug Administration.   CBC & Diff and Retic     Status: Abnormal   Collection Time: 09/23/16  3:43 PM  Result Value Ref Range   WBC 5.8 3.9 - 10.3 10e3/uL   NEUT# 1.2 (L) 1.5 - 6.5 10e3/uL   HGB 12.7 11.6 - 15.9 g/dL   HCT 37.0  34.8 -  46.6 %   Platelets 339 145 - 400 10e3/uL   MCV 76.3 (L) 79.5 - 101.0 fL   MCH 26.2 25.1 - 34.0 pg   MCHC 34.3 31.5 - 36.0 g/dL   RBC 4.85 3.70 - 5.45 10e6/uL   RDW 13.0 11.2 - 14.5 %   lymph# 3.8 (H) 0.9 - 3.3 10e3/uL   MONO# 0.7 0.1 - 0.9 10e3/uL   Eosinophils Absolute 0.1 0.0 - 0.5 10e3/uL   Basophils Absolute 0.0 0.0 - 0.1 10e3/uL   NEUT% 20.5 (L) 38.4 - 76.8 %   LYMPH% 66.0 (H) 14.0 - 49.7 %   MONO% 11.8 0.0 - 14.0 %   EOS% 1.2 0.0 - 7.0 %   BASO% 0.5 0.0 - 2.0 %   nRBC 0 0 - 0 %   Retic % 1.12 0.70 - 2.10 %   Retic Ct Abs 54.32 33.70 - 90.70 10e3/uL   Immature Retic Fract 1.50 (L) 1.60 - 10.00 %  Smear     Status: None   Collection Time: 09/23/16  3:43 PM  Result Value Ref Range   Smear Result Smear Available   Lactate dehydrogenase     Status: None   Collection Time: 09/23/16  3:43 PM  Result Value Ref Range   LDH 142 125 - 245 U/L  Flow Cytometry     Status: None   Collection Time: 09/23/16  3:43 PM  Result Value Ref Range   Flow Cytometry See Separate Report   Sjogren's syndrome antibods(ssa + ssb)     Status: None   Collection Time: 09/23/16  3:44 PM  Result Value Ref Range   SSA (Ro) (ENA) Antibody, IgG <1.0 NEG <1.0 NEG AI   SSB (La) (ENA) Antibody, IgG <1.0 NEG <1.0 NEG AI  Ammonia     Status: None   Collection Time: 09/23/16  3:45 PM  Result Value Ref Range   Ammonia, Plasma 53 19 - 87 ug/dL  Multiple Myeloma Panel (SPEP&IFE w/QIG)     Status: Abnormal   Collection Time: 09/23/16  3:45 PM  Result Value Ref Range   IgG, Qn, Serum 1,402 700 - 1600 mg/dL   IgA, Qn, Serum 702 (H) 87 - 352 mg/dL   IgM, Qn, Serum 168 26 - 217 mg/dL   Total Protein 7.9 6.0 - 8.5 g/dL   Albumin SerPl Elph-Mcnc 3.7 2.9 - 4.4 g/dL   Alpha 1 0.3 0.0 - 0.4 g/dL   Alpha2 Glob SerPl Elph-Mcnc 1.0 0.4 - 1.0 g/dL   B-Globulin SerPl Elph-Mcnc 1.5 (H) 0.7 - 1.3 g/dL   Gamma Glob SerPl Elph-Mcnc 1.4 0.4 - 1.8 g/dL   M Protein SerPl Elph-Mcnc Not Observed Not Observed g/dL   Globulin,  Total 4.2 (H) 2.2 - 3.9 g/dL   Albumin/Glob SerPl 0.9 0.7 - 1.7   IFE 1 Comment     Comment: An apparent polyclonal gammopathy: IgA. Kappa and lambda typing appear increased.    Please Note Comment     Comment: Protein electrophoresis scan will follow via computer, mail, or courier delivery.   Kappa/lambda light chains     Status: Abnormal   Collection Time: 09/23/16  3:45 PM  Result Value Ref Range   Ig Kappa Free Light Chain 28.6 (H) 3.3 - 19.4 mg/L   Ig Lambda Free Light Chain 19.5 5.7 - 26.3 mg/L   Kappa/Lambda FluidC Ratio 1.47 0.26 - 1.65  Hepatitis C antibody     Status: None   Collection Time: 09/23/16  3:45 PM  Result Value Ref Range   Hep C Virus Ab 0.1 0.0 - 0.9 s/co ratio    Comment:                                                  Negative:     < 0.8                                             Indeterminate: 0.8 - 0.9                                                  Positive:     > 0.9                 The CDC recommends that a positive HCV antibody result                 be followed up with a HCV Nucleic Acid Amplification                 test (952841).   HIV antibody (with reflex)     Status: None   Collection Time: 09/23/16  3:45 PM  Result Value Ref Range   HIV Screen 4th Generation wRfx Non Reactive Non Reactive  C3 and C4     Status: None   Collection Time: 09/23/16  3:45 PM  Result Value Ref Range   Complement C3, Serum 154 82 - 167 mg/dL   Complement C4, Serum 36 14 - 44 mg/dL  Anti-Smith antibody     Status: None   Collection Time: 09/23/16  3:45 PM  Result Value Ref Range   ENA SM Ab Ser-aCnc <1.0 NEG <1.0 NEG AI  Rheumatoid Arthritis Profile     Status: None   Collection Time: 09/23/16  3:45 PM  Result Value Ref Range   RA Latex Turbid. 11.2 0.0 - 13.9 IU/mL   CCP Antibodies IgG/IgA 8 0 - 19 units    Comment:                                          Negative               <20                                          Weak positive      20 - 39                                           Moderate positive  40 - 59  Strong positive        >59   Sedimentation rate     Status: Abnormal   Collection Time: 09/23/16  3:45 PM  Result Value Ref Range   Sedimentation Rate-Westergren 83 (H) 0 - 32 mm/hr  C-reactive protein     Status: Abnormal   Collection Time: 09/23/16  3:45 PM  Result Value Ref Range   CRP 50.1 (H) 0.0 - 4.9 mg/L  ANA, IFA (with reflex)     Status: None   Collection Time: 09/23/16  3:45 PM  Result Value Ref Range   ANA Titer 1 Negative     Comment:                                                     Negative   <1:80                                                     Borderline  1:80                                                     Positive   >1:80   Anti-DNA antibody, double-stranded     Status: None   Collection Time: 09/23/16  3:45 PM  Result Value Ref Range   dsDNA Ab 1 0 - 9 IU/mL    Comment:                                                   Negative      <5                                                   Equivocal  5 - 9                                                   Positive      >9   Hemochromatosis DNA-PCR(c282y,h63d)     Status: None   Collection Time: 09/23/16  3:45 PM  Result Value Ref Range   Hemochromatosis Gene Comment     Comment: NO MUTATION IDENTIFIED Interpretation: This patient's sample was analyzed for the hereditary hemochromatosis (HH) mutations C282Y, H63D, S65C. No mutation was identified. The mutations analyzed by [facility] are most common in the Caucasian population, and up to 90% of affected Caucasians will have a positive test result. Because this panel does not identify rare HH mutations or HH mutations found in other ethnic groups, there are a small number of people who may have a negative test but may actually be affected. The diagnosis of  HH should include clinical findings and other test results such as transferrin-iron saturation  and/or serum ferritin studies and/or liver biopsy.  If this patient has a history of HH, in many cases a specific carrier risk can be determined based on this negative result. Methodology: DNA Analysis of the HFE gene was performed by PCR amplification followed by restriction enzyme digestion analyses. Reference: Cathe Mons and Walker AP. (2000). Gen et Test 4:97-101. Cogswell ME et al. (1999). AM J Prev Med 16:134-140. Bomford A (2002). Lancet 360(9346):1673-81. Stan Head et al. (2002). Blood Cells, Molecules. and Diseases.  29(3):418-432. Imperatore G et al. (2003). Genet Med. 5(1):1-8. Cogswell ME et al. (2003). Genet Med. 5(4):304-10. This test was developed and its performance characteristics determined by [facility]. It has not been cleared or approved by the Food and Drug Administration. Genetic counselors are available for health care providers to discuss results at 1-800-345-GENE. Allison Quarry, PhD, [facility] Ruben Reason, PhD, [facility] Jens Som, PhD, [facility] Annetta Maw, M.S., PhD, [facility] Alfredo Bach, PhD, [facility] Norva Riffle, PhD, [facility] Earlean Polka, PhD, [facility]   VITAMIN D 25 Hydroxy (Vit-D Deficiency, Fractures)     Status: None   Collection Time: 09/23/16  3:45 PM  Result Value Ref Range   Vitamin D, 25-Hydroxy 64.6 30.0 - 100.0 ng/mL    Comment: Vitamin D deficiency has been defined by the [place] practice guideline as a level of serum 25-OH vitamin D less than 20 ng/mL (1,2). The Endocrine Society went on to further define vitamin D insufficiency as a level between 21 and 29 ng/mL (2). 1. IOM (Institute of Medicine). 2010. Dietary reference    intakes for calcium and D. [place]: The    [employer]. 2. Holick MF, Binkley [place], Bischoff-Ferrari HA, et al.    Evaluation, treatment, and prevention of vitamin D    deficiency: an Endocrine Society clinical practice    guideline. JCEM. 2011  Jul; 96(7):1911-30.   PTH, intact and calcium     Status: Abnormal   Collection Time: 09/23/16  3:45 PM  Result Value Ref Range   Calcium, Ser 10.3 (H) 8.7 - 10.2 mg/dL   PTH, Intact 19 15 - 65 pg/mL   PTH Comment     Comment:                Interpretation                 Intact PTH    Calcium                                                (pg/mL)      (mg/dL)                Normal                          15 - 65     8.6 - 10.2                Primary Hyperparathyroidism         >65          >10.2                Secondary Hyperparathyroidism       >65          <  10.2                Non-Parathyroid Hypercalcemia       <65          >10.2                Hypoparathyroidism                  <15          < 8.6                Non-Parathyroid Hypocalcemia    15 - 65          < 8.6   Angiotensin converting enzyme     Status: None   Collection Time: 09/23/16  3:45 PM  Result Value Ref Range   Angio Convert Enzyme 38 14 - 82 U/L  Comprehensive metabolic panel     Status: Abnormal   Collection Time: 09/23/16  3:45 PM  Result Value Ref Range   Sodium 134 (L) 136 - 145 mEq/L   Potassium 2.6 (LL) 3.5 - 5.1 mEq/L   Chloride 89 (L) 98 - 109 mEq/L   CO2 27 22 - 29 mEq/L   Glucose 78 70 - 140 mg/dl    Comment: Glucose reference range is for nonfasting patients. Fasting glucose reference range is 70- 100.   BUN 17.0 7.0 - 26.0 mg/dL   Creatinine 1.1 0.6 - 1.1 mg/dL   Total Bilirubin 0.82 0.20 - 1.20 mg/dL   Alkaline Phosphatase 66 40 - 150 U/L   AST 29 5 - 34 U/L   ALT 44 0 - 55 U/L   Total Protein 8.5 (H) 6.4 - 8.3 g/dL   Albumin 3.5 3.5 - 5.0 g/dL   Calcium 10.5 (H) 8.4 - 10.4 mg/dL   Anion Gap 18 (H) 3 - 11 mEq/L   EGFR 76 (L) >90 ml/min/1.73 m2    Comment: eGFR is calculated using the CKD-EPI Creatinine Equation (2009)  Potassium     Status: Abnormal   Collection Time: 09/25/16  9:43 AM  Result Value Ref Range   Potassium 3.0 (L) 3.5 - 5.1 mEq/L  Magnesium     Status: Abnormal    Collection Time: 09/25/16  9:43 AM  Result Value Ref Range   Magnesium 1.4 (L) 1.5 - 2.5 mg/dL  Comprehensive metabolic panel     Status: Abnormal   Collection Time: 09/25/16  3:57 PM  Result Value Ref Range   Sodium 136 135 - 145 mmol/L   Potassium 3.3 (L) 3.5 - 5.1 mmol/L   Chloride 97 (L) 101 - 111 mmol/L   CO2 28 22 - 32 mmol/L   Glucose, Bld 85 65 - 99 mg/dL   BUN 12 6 - 20 mg/dL   Creatinine, Ser 0.97 0.44 - 1.00 mg/dL   Calcium 9.2 8.9 - 10.3 mg/dL   Total Protein 7.1 6.5 - 8.1 g/dL   Albumin 3.8 3.5 - 5.0 g/dL   AST 26 15 - 41 U/L   ALT 31 14 - 54 U/L   Alkaline Phosphatase 46 38 - 126 U/L   Total Bilirubin 0.8 0.3 - 1.2 mg/dL   GFR calc non Af Amer >60 >60 mL/min   GFR calc Af Amer >60 >60 mL/min    Comment: (NOTE) The eGFR has been calculated using the CKD EPI equation. This calculation has not been validated in all clinical situations. eGFR's persistently <60 mL/min signify possible Chronic Kidney Disease.    Anion  gap 11 5 - 15  Magnesium     Status: Abnormal   Collection Time: 09/25/16  3:57 PM  Result Value Ref Range   Magnesium 1.4 (L) 1.7 - 2.4 mg/dL  CBC     Status: Abnormal   Collection Time: 09/25/16  3:57 PM  Result Value Ref Range   WBC 4.8 4.0 - 10.5 K/uL   RBC 4.38 3.87 - 5.11 MIL/uL   Hemoglobin 11.3 (L) 12.0 - 15.0 g/dL   HCT 86.0 (L) 22.2 - 11.7 %   MCV 78.3 78.0 - 100.0 fL   MCH 25.8 (L) 26.0 - 34.0 pg   MCHC 32.9 30.0 - 36.0 g/dL   RDW 09.4 84.1 - 45.6 %   Platelets 278 150 - 400 K/uL  Basic metabolic panel     Status: Abnormal   Collection Time: 09/26/16  5:09 AM  Result Value Ref Range   Sodium 138 135 - 145 mmol/L   Potassium 3.5 3.5 - 5.1 mmol/L   Chloride 101 101 - 111 mmol/L   CO2 29 22 - 32 mmol/L   Glucose, Bld 96 65 - 99 mg/dL   BUN 12 6 - 20 mg/dL   Creatinine, Ser 5.47 (H) 0.44 - 1.00 mg/dL   Calcium 8.8 (L) 8.9 - 10.3 mg/dL   GFR calc non Af Amer >60 >60 mL/min   GFR calc Af Amer >60 >60 mL/min    Comment: (NOTE) The  eGFR has been calculated using the CKD EPI equation. This calculation has not been validated in all clinical situations. eGFR's persistently <60 mL/min signify possible Chronic Kidney Disease.    Anion gap 8 5 - 15  CBC     Status: Abnormal   Collection Time: 09/26/16  5:09 AM  Result Value Ref Range   WBC 4.7 4.0 - 10.5 K/uL   RBC 4.24 3.87 - 5.11 MIL/uL   Hemoglobin 11.0 (L) 12.0 - 15.0 g/dL   HCT 28.2 (L) 50.3 - 11.7 %   MCV 79.7 78.0 - 100.0 fL   MCH 25.9 (L) 26.0 - 34.0 pg   MCHC 32.5 30.0 - 36.0 g/dL   RDW 43.4 36.2 - 05.8 %   Platelets 259 150 - 400 K/uL  Magnesium     Status: None   Collection Time: 09/26/16  5:09 AM  Result Value Ref Range   Magnesium 2.1 1.7 - 2.4 mg/dL  CBC & Diff and Retic     Status: Abnormal   Collection Time: 10/08/16  3:16 PM  Result Value Ref Range   WBC 6.1 3.9 - 10.3 10e3/uL   NEUT# 2.0 1.5 - 6.5 10e3/uL   HGB 11.5 (L) 11.6 - 15.9 g/dL   HCT 59.5 37.9 - 62.6 %   Platelets 259 145 - 400 10e3/uL   MCV 79.0 (L) 79.5 - 101.0 fL   MCH 26.0 25.1 - 34.0 pg   MCHC 33.0 31.5 - 36.0 g/dL   RBC 7.05 1.86 - 3.63 10e6/uL   RDW 14.8 (H) 11.2 - 14.5 %   lymph# 3.7 (H) 0.9 - 3.3 10e3/uL   MONO# 0.3 0.1 - 0.9 10e3/uL   Eosinophils Absolute 0.1 0.0 - 0.5 10e3/uL   Basophils Absolute 0.0 0.0 - 0.1 10e3/uL   NEUT% 33.4 (L) 38.4 - 76.8 %   LYMPH% 60.0 (H) 14.0 - 49.7 %   MONO% 5.4 0.0 - 14.0 %   EOS% 1.0 0.0 - 7.0 %   BASO% 0.2 0.0 - 2.0 %   Retic % 1.24  0.70 - 2.10 %   Retic Ct Abs 54.81 33.70 - 90.70 10e3/uL   Immature Retic Fract 0.80 (L) 1.60 - 10.00 %  Magnesium     Status: Abnormal   Collection Time: 10/08/16  3:16 PM  Result Value Ref Range   Magnesium 1.3 (LL) 1.5 - 2.5 mg/dl  Comprehensive metabolic panel     Status: Abnormal   Collection Time: 10/08/16  3:16 PM  Result Value Ref Range   Sodium 141 136 - 145 mEq/L   Potassium 4.1 3.5 - 5.1 mEq/L   Chloride 108 98 - 109 mEq/L   CO2 23 22 - 29 mEq/L   Glucose 96 70 - 140 mg/dl     Comment: Glucose reference range is for nonfasting patients. Fasting glucose reference range is 70- 100.   BUN 8.6 7.0 - 26.0 mg/dL   Creatinine 0.8 0.6 - 1.1 mg/dL   Total Bilirubin 0.47 0.20 - 1.20 mg/dL   Alkaline Phosphatase 47 40 - 150 U/L   AST 18 5 - 34 U/L   ALT 17 0 - 55 U/L   Total Protein 7.0 6.4 - 8.3 g/dL   Albumin 2.9 (L) 3.5 - 5.0 g/dL   Calcium 9.3 8.4 - 10.4 mg/dL   Anion Gap 9 3 - 11 mEq/L   EGFR >90 >90 ml/min/1.73 m2    Comment: eGFR is calculated using the CKD-EPI Creatinine Equation (2009)  Urinalysis, Routine w reflex microscopic     Status: Abnormal   Collection Time: 10/22/16  9:44 AM  Result Value Ref Range   Color, Urine YELLOW Yellow;Lt. Yellow   APPearance Sl Cloudy (A) Clear   Specific Gravity, Urine >=1.030 (A) 1.000 - 1.030   pH 5.5 5.0 - 8.0   Total Protein, Urine 30 (A) Negative   Urine Glucose NEGATIVE Negative   Ketones, ur >=80 (A) Negative   Bilirubin Urine MODERATE (A) Negative   Hgb urine dipstick TRACE-INTACT (A) Negative   Urobilinogen, UA 0.2 0.0 - 1.0   Leukocytes, UA NEGATIVE Negative   Nitrite NEGATIVE Negative   WBC, UA 0-2/hpf 0-2/hpf   RBC / HPF 0-2/hpf 0-2/hpf   Mucus, UA Presence of (A) None   Squamous Epithelial / LPF Rare(0-4/hpf) Rare(0-4/hpf)   Ca Oxalate Crys, UA Presence of (A) None  CBC w/Diff     Status: Abnormal   Collection Time: 10/22/16 10:17 AM  Result Value Ref Range   WBC 5.2 4.5 - 10.5 K/uL   RBC 4.63 3.87 - 5.11 Mil/uL   Hemoglobin 12.1 12.0 - 15.0 g/dL   HCT 36.3 36.0 - 46.0 %   MCV 78.3 78.0 - 100.0 fl   MCHC 33.4 30.0 - 36.0 g/dL   RDW 15.3 (H) 11.5 - 14.6 %   Platelets 231.0 150.0 - 400.0 K/uL   Neutrophils Relative % 29.5 (L) 43.0 - 77.0 %   Lymphocytes Relative 60.8 Repeated and verified X2. (H) 12.0 - 46.0 %   Monocytes Relative 6.5 3.0 - 12.0 %   Eosinophils Relative 2.7 0.0 - 5.0 %   Basophils Relative 0.5 0.0 - 3.0 %   Neutro Abs 1.5 1.4 - 7.7 K/uL   Lymphs Abs 3.2 0.7 - 4.0 K/uL    Monocytes Absolute 0.3 0.1 - 1.0 K/uL   Eosinophils Absolute 0.1 0.0 - 0.7 K/uL   Basophils Absolute 0.0 0.0 - 0.1 K/uL  Magnesium     Status: Abnormal   Collection Time: 10/22/16 10:17 AM  Result Value Ref Range   Magnesium 1.4 (  L) 1.5 - 2.5 mg/dL  Sed Rate (ESR)     Status: Abnormal   Collection Time: 10/22/16 10:17 AM  Result Value Ref Range   Sed Rate 42 (H) 0 - 20 mm/hr  C-reactive protein     Status: Abnormal   Collection Time: 10/22/16 10:17 AM  Result Value Ref Range   CRP 0.1 (L) 0.5 - 20.0 mg/dL  TSH     Status: None   Collection Time: 10/22/16 10:17 AM  Result Value Ref Range   TSH 0.44 0.35 - 5.50 uIU/mL   Follow-up: Return if symptoms worsen or fail to improve.  Wilfred Lacy, NP

## 2016-10-23 ENCOUNTER — Other Ambulatory Visit: Payer: Self-pay | Admitting: *Deleted

## 2016-10-23 ENCOUNTER — Other Ambulatory Visit: Payer: BLUE CROSS/BLUE SHIELD

## 2016-10-23 DIAGNOSIS — D72820 Lymphocytosis (symptomatic): Secondary | ICD-10-CM

## 2016-10-23 LAB — CBC WITH DIFFERENTIAL/PLATELET
BASOS PCT: 0.5 % (ref 0.0–3.0)
Basophils Absolute: 0 10*3/uL (ref 0.0–0.1)
EOS ABS: 0.1 10*3/uL (ref 0.0–0.7)
Eosinophils Relative: 2.7 % (ref 0.0–5.0)
HEMATOCRIT: 36.3 % (ref 36.0–46.0)
Hemoglobin: 12.1 g/dL (ref 12.0–15.0)
LYMPHS ABS: 3.2 10*3/uL (ref 0.7–4.0)
MCHC: 33.4 g/dL (ref 30.0–36.0)
MCV: 78.3 fl (ref 78.0–100.0)
MONOS PCT: 6.5 % (ref 3.0–12.0)
Monocytes Absolute: 0.3 10*3/uL (ref 0.1–1.0)
NEUTROS ABS: 1.5 10*3/uL (ref 1.4–7.7)
NEUTROS PCT: 29.5 % — AB (ref 43.0–77.0)
PLATELETS: 231 10*3/uL (ref 150.0–400.0)
RBC: 4.63 Mil/uL (ref 3.87–5.11)
RDW: 15.3 % — AB (ref 11.5–14.6)
WBC: 5.2 10*3/uL (ref 4.5–10.5)

## 2016-10-23 MED ORDER — POTASSIUM CHLORIDE CRYS ER 20 MEQ PO TBCR: 20.0000 meq | EXTENDED_RELEASE_TABLET | Freq: Every day | ORAL | 0 refills | Status: DC

## 2016-10-23 MED ORDER — MAGNESIUM OXIDE 400 (241.3 MG) MG PO TABS: 400.0000 mg | ORAL_TABLET | Freq: Two times a day (BID) | ORAL | 0 refills | Status: DC

## 2016-10-23 MED ORDER — METHOCARBAMOL 500 MG PO TABS: 500.0000 mg | ORAL_TABLET | Freq: Three times a day (TID) | ORAL | 0 refills | Status: DC | PRN

## 2016-10-24 ENCOUNTER — Emergency Department (HOSPITAL_COMMUNITY): Payer: BLUE CROSS/BLUE SHIELD

## 2016-10-24 ENCOUNTER — Encounter (HOSPITAL_COMMUNITY): Payer: Self-pay | Admitting: Emergency Medicine

## 2016-10-24 ENCOUNTER — Emergency Department (HOSPITAL_COMMUNITY)
Admission: EM | Admit: 2016-10-24 | Discharge: 2016-10-24 | Disposition: A | Discharge status: Home / Self Care | Payer: BLUE CROSS/BLUE SHIELD | Attending: Emergency Medicine | Admitting: Emergency Medicine

## 2016-10-24 DIAGNOSIS — K8050 Calculus of bile duct without cholangitis or cholecystitis without obstruction: Secondary | ICD-10-CM | POA: Insufficient documentation

## 2016-10-24 DIAGNOSIS — R109 Unspecified abdominal pain: Secondary | ICD-10-CM | POA: Diagnosis present

## 2016-10-24 DIAGNOSIS — I10 Essential (primary) hypertension: Secondary | ICD-10-CM | POA: Insufficient documentation

## 2016-10-24 DIAGNOSIS — K829 Disease of gallbladder, unspecified: Secondary | ICD-10-CM

## 2016-10-24 LAB — CBC
HCT: 38.8 % (ref 36.0–46.0)
Hemoglobin: 13 g/dL (ref 12.0–15.0)
MCH: 26.4 pg (ref 26.0–34.0)
MCHC: 33.5 g/dL (ref 30.0–36.0)
MCV: 78.7 fL (ref 78.0–100.0)
Platelets: 251 K/uL (ref 150–400)
RBC: 4.93 MIL/uL (ref 3.87–5.11)
RDW: 14.8 % (ref 11.5–15.5)
WBC: 8 K/uL (ref 4.0–10.5)

## 2016-10-24 LAB — COMPREHENSIVE METABOLIC PANEL
ALT: 26 U/L (ref 14–54)
AST: 83 U/L — AB (ref 15–41)
Albumin: 3.1 g/dL — ABNORMAL LOW (ref 3.5–5.0)
Alkaline Phosphatase: 45 U/L (ref 38–126)
Anion gap: 15 (ref 5–15)
BILIRUBIN TOTAL: 1.4 mg/dL — AB (ref 0.3–1.2)
BUN: 8 mg/dL (ref 6–20)
CO2: 15 mmol/L — ABNORMAL LOW (ref 22–32)
CREATININE: 0.73 mg/dL (ref 0.44–1.00)
Calcium: 9.3 mg/dL (ref 8.9–10.3)
Chloride: 106 mmol/L (ref 101–111)
GFR calc Af Amer: >60 mL/min (ref >60)
GFR calc non Af Amer: >60 mL/min (ref >60)
Glucose, Bld: 77 mg/dL (ref 65–99)
Potassium: 4 mmol/L (ref 3.5–5.1)
Sodium: 136 mmol/L (ref 135–145)
TOTAL PROTEIN: 6.9 g/dL (ref 6.5–8.1)

## 2016-10-24 LAB — I-STAT BETA HCG BLOOD, ED (MC, WL, AP ONLY): I-stat hCG, quantitative: <5 m[IU]/mL (ref <5)

## 2016-10-24 LAB — LIPASE, BLOOD: Lipase: 34 U/L (ref 11–51)

## 2016-10-24 MED ORDER — ONDANSETRON HCL 4 MG/2ML IJ SOLN
4.0000 mg | Freq: Once | INTRAMUSCULAR | Status: AC | PRN
  Administered 2016-10-24: 4 mg via INTRAVENOUS
  Filled 2016-10-24: qty 2

## 2016-10-24 MED ORDER — MORPHINE SULFATE (PF) 4 MG/ML IV SOLN
4.0000 mg | Freq: Once | INTRAVENOUS | Status: AC
  Administered 2016-10-24: 4 mg via INTRAVENOUS
  Filled 2016-10-24: qty 1

## 2016-10-24 MED ORDER — FENTANYL CITRATE (PF) 100 MCG/2ML IJ SOLN
100.0000 ug | Freq: Once | INTRAMUSCULAR | Status: AC
  Administered 2016-10-24: 100 ug via INTRAVENOUS
  Filled 2016-10-24: qty 2

## 2016-10-24 MED ORDER — ONDANSETRON HCL 4 MG/2ML IJ SOLN
4.0000 mg | Freq: Once | INTRAMUSCULAR | Status: AC
  Administered 2016-10-24: 4 mg via INTRAVENOUS
  Filled 2016-10-24: qty 2

## 2016-10-24 MED ORDER — ONDANSETRON 4 MG PO TBDP: 4.0000 mg | ORAL_TABLET | Freq: Three times a day (TID) | ORAL | 0 refills | Status: DC | PRN

## 2016-10-24 MED ORDER — HYDROCODONE-ACETAMINOPHEN 5-325 MG PO TABS: 1.0000 | ORAL_TABLET | ORAL | 0 refills | Status: DC | PRN

## 2016-10-24 MED ORDER — SODIUM CHLORIDE 0.9 % IV SOLN: INTRAVENOUS | Status: DC

## 2016-10-24 MED ORDER — SODIUM CHLORIDE 0.9 % IV BOLUS (SEPSIS)
1000.0000 mL | Freq: Once | INTRAVENOUS | Status: AC
Start: 2016-10-24 — End: 2016-10-24
  Administered 2016-10-24: 1000 mL via INTRAVENOUS

## 2016-10-24 NOTE — ED Provider Notes (Signed)
Patient with minimal nausea. Has had water to drink here. Ultrasound shows stones. No pericholecystic fluid or ductal dilatation. Some slight tenderness over the gallbladder noted on ultrasound. His I reexamined the patient she does not have tenderness in the right upper quadrant exam and states she feels well. I discussed with her talking to surgeon regarding expediting her gallbladder surgery. She had a strong desire to wait and speak with him as an outpatient to schedule it. She is taking by mouth here symptoms are controlled. Given Vicodin and Zofran prescriptions. Encouraged to follow-up with surgeon on Monday   Rolland PorterMark Kitt Minardi, MD 10/24/16 1016

## 2016-10-24 NOTE — ED Provider Notes (Signed)
[facility]-EMERGENCY DEPT Provider Note   CSN: [id number] Arrival date & time: 10/24/16  0540     History   Chief Complaint Chief Complaint  Patient presents with  . Abdominal Pain  . Nausea    HPI Stacey Holland is a 20 y.o. female.  She presents for sudden onset of pain in her abdomen, 2 hours ago. She has ongoing nausea for several months and has been evaluated for the same, several times. She had an ultrasound done September 2015, which showed cholelithiasis and sludge. She has not seen a Careers advisersurgeon about it. She denies cough, shortness of breath, chest pain, weakness or dizziness. She did not try anything for the pain prior to arrival today. She was recently treated for a URI, with Zithromax. Also, she saw her PCP 2 days ago for myalgia and was treated with Robaxin. There are no other known modifying factors.  HPI  Past Medical History:  Diagnosis Date  . History of blood transfusion    prematurity, this was during perinatal period  . Hypertension   . Morbid obesity (HCC) 04/03/2016  . PCOS (polycystic ovarian syndrome) 04/03/2016  . Reflux     Patient Active Problem List   Diagnosis Date Noted  . Acute upper respiratory infection 10/12/2016  . Nausea and vomiting   . Hypomagnesemia 09/25/2016  . Loss of weight 09/25/2016  . Hypokalemia 09/18/2016  . Early satiety 09/17/2016  . Weakness generalized 09/17/2016  . Cough 09/17/2016  . Calculus of gallbladder without cholecystitis without obstruction 09/03/2016  . Routine general medical examination at a health care facility 08/26/2016  . Atypical lymphocytosis 08/25/2016  . PCOS (polycystic ovarian syndrome) 04/03/2016  . Hyperglycemia 04/03/2016  . Morbid obesity (HCC) 04/03/2016  . Essential hypertension 02/20/2015  . GE reflux     Past Surgical History:  Procedure Laterality Date  . ESOPHAGOGASTRODUODENOSCOPY N/A 09/26/2016   Procedure: ESOPHAGOGASTRODUODENOSCOPY (EGD);  Surgeon: Beverley FiedlerJay M Pyrtle, MD;  [facility];  Service: Endoscopy;  Laterality: N/A;  . eustachion tubes      OB History    No data available       Home Medications    Prior to Admission medications   Medication Sig Start Date End Date Taking? Authorizing Provider  acetaminophen (TYLENOL) 500 MG tablet Take 1,000 mg by mouth every 6 (six) hours as needed for moderate pain.   Yes Historical Provider, MD  Multiple Vitamin (MULTIVITAMIN WITH MINERALS) TABS tablet Take 1 tablet by mouth daily.   Yes Historical Provider, MD  Norgestimate-Ethinyl Estradiol Triphasic (TRI-PREVIFEM) 0.18/0.215/0.25 MG-35 MCG tablet Take 1 tablet by mouth at bedtime.   Yes Historical Provider, MD  omeprazole (PRILOSEC) 20 MG capsule Take 20 mg by mouth at bedtime.   Yes Historical Provider, MD  magnesium oxide (MAG-OX) 400 (241.3 Mg) MG tablet Take 1 tablet (400 mg total) by mouth 2 (two) times daily. 10/23/16   Johney MaineGautam Kishore Kale, MD  methocarbamol (ROBAXIN) 500 MG tablet Take 1 tablet (500 mg total) by mouth every 8 (eight) hours as needed for muscle spasms. 10/23/16   Anne Ngharlotte Lum Nche, NP  metoCLOPramide (REGLAN) 5 MG tablet Take 1 tablet (5 mg total) by mouth 3 (three) times daily before meals. Patient not taking: Reported on 10/24/2016 09/26/16   Tyrone Nineyan B Grunz, MD  potassium chloride SA (K-DUR,KLOR-CON) 20 MEQ tablet Take 1 tablet (20 mEq total) by mouth daily. 10/23/16   Johney MaineGautam Kishore Kale, MD    Family History Family History  Problem Relation Age of Onset  .  GER disease Mother   . Hypertension Mother   . GER disease Father   . Hypertension Father   . Lactose intolerance Brother   . Hypertension Maternal Grandfather   . Cancer Neg Hx   . Depression Neg Hx   . Drug abuse Neg Hx   . Early death Neg Hx   . Heart disease Neg Hx   . Hyperlipidemia Neg Hx   . Kidney disease Neg Hx   . Alcohol abuse Neg Hx   . Asthma Neg Hx   . Stroke Neg Hx     Social History Social History  Substance Use Topics  . Smoking status: Never  Smoker  . Smokeless tobacco: Never Used  . Alcohol use No     Allergies   Pineapple and Other   Review of Systems Review of Systems  All other systems reviewed and are negative.    Physical Exam Updated Vital Signs BP (!) 151/114 (BP Location: Left Arm)   Pulse 115   Temp 97.8 F (36.6 C) (Oral)   Resp 18   Ht 5\' 4"  (1.626 m)   Wt 175 lb (79.4 kg)   LMP 10/18/2016   SpO2 100%   BMI 30.04 kg/m   Physical Exam  Constitutional: She is oriented to person, place, and time. She appears well-developed and well-nourished. No distress.  HENT:  Head: Normocephalic and atraumatic.  Eyes: Conjunctivae and EOM are normal. Pupils are equal, round, and reactive to light.  Neck: Normal range of motion and phonation normal. Neck supple.  Cardiovascular: Normal rate and regular rhythm.   Pulmonary/Chest: Effort normal and breath sounds normal. She exhibits no tenderness.  Abdominal: Soft. She exhibits no distension. There is tenderness (RUQ, mild). There is no rebound and no guarding. No hernia.  Musculoskeletal: Normal range of motion.  Neurological: She is alert and oriented to person, place, and time. She exhibits normal muscle tone.  Skin: Skin is warm and dry.  Psychiatric: She has a normal mood and affect. Her behavior is normal. Judgment and thought content normal.  Nursing note and vitals reviewed.    ED Treatments / Results  Labs (all labs ordered are listed, but only abnormal results are displayed) Labs Reviewed  COMPREHENSIVE METABOLIC PANEL - Abnormal; Notable for the following:       Result Value   CO2 15 (*)    Albumin 3.1 (*)    AST 83 (*)    Total Bilirubin 1.4 (*)    All other components within normal limits  LIPASE, BLOOD  CBC  URINALYSIS, ROUTINE W REFLEX MICROSCOPIC  I-STAT BETA HCG BLOOD, ED ([facility], [facility], [facility] ONLY)    EKG  EKG Interpretation None       Radiology Dg Chest 2 View  Result Date: 10/22/2016 CLINICAL DATA:  Three days of shortness of  breath. History of morbid obesity, hypertension, and gastroesophageal reflux. EXAM: CHEST  2 VIEW COMPARISON:  CT scan of the chest of September 30, 2016 and chest x-ray of September 17, 2016. FINDINGS: The lungs are adequately inflated and clear. The heart and pulmonary vascularity are normal. The mediastinum is normal in width. There is no pleural effusion. The bony thorax exhibits no acute abnormality. The bowel gas pattern in the upper abdomen is unremarkable. IMPRESSION: There is no active cardiopulmonary disease. Electronically Signed   By: David  SwazilandJordan M.D.   On: 10/22/2016 10:12    Procedures Procedures (including critical care time)  Medications Ordered in ED Medications  0.9 %  sodium chloride infusion (not administered)  fentaNYL (SUBLIMAZE) injection 100 mcg (not administered)  ondansetron (ZOFRAN) injection 4 mg (4 mg Intravenous Given 10/24/16 0633)  sodium chloride 0.9 % bolus 1,000 mL (1,000 mLs Intravenous New Bag/Given 10/24/16 8119)     Initial Impression / Assessment and Plan / ED Course  I have reviewed the triage vital signs and the nursing notes.  Pertinent labs & imaging results that were available during my care of the patient were reviewed by me and considered in my medical decision making (see chart for details).  Clinical Course as of Oct 24 700  Sat Oct 24, 2016  1478 Low CO2: (!) 15 [EW]  0659 High AST: (!) 83 [EW]  0659 High Total Bilirubin: (!) 1.4 [EW]    Clinical Course User Index [EW] Mancel Bale, MD    Medications  0.9 %  sodium chloride infusion (not administered)  fentaNYL (SUBLIMAZE) injection 100 mcg (not administered)  ondansetron (ZOFRAN) injection 4 mg (4 mg Intravenous Given 10/24/16 2956)  sodium chloride 0.9 % bolus 1,000 mL (1,000 mLs Intravenous New Bag/Given 10/24/16 2130)    Patient Vitals for the past 24 hrs:  BP Temp Temp src Pulse Resp SpO2 Height Weight  10/24/16 0544 (!) 151/114 97.8 F (36.6 C) Oral 115 18 100 % 5\' 4"   (1.626 m) 175 lb (79.4 kg)    6:58 AM Reevaluation with update and discussion. After initial assessment and treatment, an updated evaluation reveals She feels better after a time and it has been given. She is still somewhat uncomfortable. Ultrasound ordered because of evidence for biliary obstruction. Sausha Raymond L    Final Clinical Impressions(s) / ED Diagnoses   Final diagnoses:  Gall bladder disease    Known gallbladder disease with sludge, with worsening discomfort, and intelligence for biliary obstruction. U/S ordered to evaluate for complications.   Nursing Notes Reviewed/ Care Coordinated Applicable Imaging Reviewed Interpretation of Laboratory Data incorporated into ED treatment  Plan: As per Dr. Fayrene Fearing after U/S  New Prescriptions New Prescriptions   No medications on file     Mancel Bale, MD 10/25/16 1331

## 2016-10-24 NOTE — ED Notes (Signed)
As I write this, u/s is being performed in her room.

## 2016-10-24 NOTE — ED Notes (Signed)
Made two IV attempts.  Pt demanding that IV must be in hand and will not let nurse try for anything above her wrist, also does not want other hand done b/c she had blood taken from it already.  Second RN attempting.

## 2016-10-24 NOTE — Discharge Instructions (Signed)
Bland, low-fat, small meals.  Call Surgeon Monday to discuss follow up to discuss surgery.

## 2016-10-24 NOTE — ED Triage Notes (Signed)
Pt complaint of acute onset generalized abdominal pain with associated nausea at 0300. Pt denies vomiting, diarrhea, or GU symptoms.

## 2016-10-26 ENCOUNTER — Other Ambulatory Visit (INDEPENDENT_AMBULATORY_CARE_PROVIDER_SITE_OTHER): Payer: BLUE CROSS/BLUE SHIELD

## 2016-10-26 DIAGNOSIS — R531 Weakness: Secondary | ICD-10-CM

## 2016-10-26 DIAGNOSIS — R8290 Unspecified abnormal findings in urine: Secondary | ICD-10-CM

## 2016-10-26 DIAGNOSIS — E878 Other disorders of electrolyte and fluid balance, not elsewhere classified: Secondary | ICD-10-CM

## 2016-10-26 DIAGNOSIS — M791 Myalgia, unspecified site: Secondary | ICD-10-CM

## 2016-10-26 LAB — URINALYSIS, ROUTINE W REFLEX MICROSCOPIC
HGB URINE DIPSTICK: NEGATIVE
LEUKOCYTES UA: NEGATIVE
NITRITE: NEGATIVE
RBC / HPF: NONE SEEN (ref 0–?)
Specific Gravity, Urine: >=1.03 — AB (ref 1.000–1.030)
TOTAL PROTEIN, URINE-UPE24: NEGATIVE
URINE GLUCOSE: NEGATIVE
Urobilinogen, UA: 0.2 (ref 0.0–1.0)
pH: 5.5 (ref 5.0–8.0)

## 2016-10-27 ENCOUNTER — Other Ambulatory Visit: Payer: BLUE CROSS/BLUE SHIELD

## 2016-10-28 ENCOUNTER — Other Ambulatory Visit (INDEPENDENT_AMBULATORY_CARE_PROVIDER_SITE_OTHER): Payer: BLUE CROSS/BLUE SHIELD

## 2016-10-28 ENCOUNTER — Encounter: Payer: Self-pay | Admitting: Nurse Practitioner

## 2016-10-28 ENCOUNTER — Ambulatory Visit (INDEPENDENT_AMBULATORY_CARE_PROVIDER_SITE_OTHER): Payer: BLUE CROSS/BLUE SHIELD | Admitting: Nurse Practitioner

## 2016-10-28 VITALS — BP 122/84 | HR 93 | Temp 98.4°F | Ht 64.0 in | Wt 175.0 lb

## 2016-10-28 DIAGNOSIS — R1011 Right upper quadrant pain: Secondary | ICD-10-CM

## 2016-10-28 DIAGNOSIS — E878 Other disorders of electrolyte and fluid balance, not elsewhere classified: Secondary | ICD-10-CM

## 2016-10-28 DIAGNOSIS — R531 Weakness: Secondary | ICD-10-CM | POA: Diagnosis not present

## 2016-10-28 DIAGNOSIS — K8020 Calculus of gallbladder without cholecystitis without obstruction: Secondary | ICD-10-CM

## 2016-10-28 DIAGNOSIS — M791 Myalgia, unspecified site: Secondary | ICD-10-CM

## 2016-10-28 DIAGNOSIS — R110 Nausea: Secondary | ICD-10-CM | POA: Diagnosis not present

## 2016-10-28 DIAGNOSIS — R8290 Unspecified abnormal findings in urine: Secondary | ICD-10-CM | POA: Diagnosis not present

## 2016-10-28 LAB — BASIC METABOLIC PANEL
BUN: 6 mg/dL (ref 6–23)
CO2: 25 meq/L (ref 19–32)
Calcium: 8.9 mg/dL (ref 8.4–10.5)
Chloride: 107 mEq/L (ref 96–112)
Creatinine, Ser: 0.73 mg/dL (ref 0.40–1.20)
GFR: 107.07 mL/min (ref >60.00)
GLUCOSE: 98 mg/dL (ref 70–99)
POTASSIUM: 4 meq/L (ref 3.5–5.1)
Sodium: 140 mEq/L (ref 135–145)

## 2016-10-28 MED ORDER — PROMETHAZINE HCL 12.5 MG PO TABS: 12.5000 mg | ORAL_TABLET | Freq: Three times a day (TID) | ORAL | 0 refills | Status: DC | PRN

## 2016-10-28 MED ORDER — TRAMADOL HCL 50 MG PO TABS: 100.0000 mg | ORAL_TABLET | Freq: Four times a day (QID) | ORAL | 0 refills | Status: DC | PRN

## 2016-10-28 NOTE — Patient Instructions (Addendum)
Maintain upcoming appt with surgeon tomorrow and rheumatologist 11/13/15. Maintain clear liquid diet. Push oral hydration  Clear Liquid Diet A clear liquid diet means that you only have liquids that you can see through. You do not eat any food on this diet. Most people need to follow this diet for only a short time. What do I need to know about this diet?  A clear liquid is a liquid that you can see through when you hold it up to a light.  This diet does not give you all the nutrients that you need. Choose a variety of the liquids that your doctor says you can drink on this diet. That way, you will get as many nutrients as possible.  If you are not sure whether you can have certain items, ask your doctor. What can I have?  Water and flavored water.  Fruit juices that do not have pulp, such as cranberry juice and apple juice.  Tea and coffee without milk or cream.  Clear bouillon or broth.  Broth-based soups that have been strained.  Flavored gelatins.  Honey.  Sugar water.  Frozen ice or frozen ice pops that do not have any milk, yogurt, fruit pieces, or fruit pulp in them.  Clear sodas.  Clear sports drinks. The items listed above may not be a complete list of recommended liquids. Contact your food and nutrition expert (dietitian) for more options.  What can I not have?  Juices that have pulp.  Milk.  Cream or cream-based soups.  Yogurt. The items listed above may not be a complete list of liquids to avoid. Contact your food and nutrition expert for more information.  Summary  A clear liquid diet is a diet that includes only liquids that you can see through.  The goal of this diet is to help you recover.  Make sure to avoid liquids with milk, cream, or pulp while you are on this diet. This information is not intended to replace advice given to you by your health care provider. Make sure you discuss any questions you have with your health care provider. Document  Released: 10/08/2008 Document Revised: 06/08/2016 Document Reviewed: 09/22/2013 Elsevier Interactive Patient Education  2017 [employer]   Cholelithiasis Cholelithiasis is also called "gallstones." It is a kind of gallbladder disease. The gallbladder is an organ that stores a liquid (bile) that helps you digest fat. Gallstones may not cause symptoms (may be silent gallstones) until they cause a blockage, and then they can cause pain (gallbladder attack). Follow these instructions at home:  Take over-the-counter and prescription medicines only as told by your doctor.  Stay at a healthy weight.  Eat healthy foods. This includes:  Eating fewer fatty foods, like fried foods.  Eating fewer refined carbs (refined carbohydrates). Refined carbs are breads and grains that are highly processed, like white bread and white rice. Instead, choose whole grains like whole-wheat bread and brown rice.  Eating more fiber. Almonds, fresh fruit, and beans are healthy sources of fiber.  Keep all follow-up visits as told by your doctor. This is important. Contact a doctor if:  You have sudden pain in the upper right side of your belly (abdomen). Pain might spread to your right shoulder or your chest. This may be a sign of a gallbladder attack.  You feel sick to your stomach (are nauseous).  You throw up (vomit).  You have been diagnosed with gallstones that have no symptoms and you get:  Belly pain.  Discomfort, burning, or  fullness in the upper part of your belly (indigestion). Get help right away if:  You have sudden pain in the upper right side of your belly, and it lasts for more than 2 hours.  You have belly pain that lasts for more than 5 hours.  You have a fever or chills.  You keep feeling sick to your stomach or you keep throwing up.  Your skin or the whites of your eyes turn yellow (jaundice).  You have dark-colored pee (urine).  You have light-colored poop  (stool). Summary  Cholelithiasis is also called "gallstones."  The gallbladder is an organ that stores a liquid (bile) that helps you digest fat.  Silent gallstones are gallstones that do not cause symptoms.  A gallbladder attack may cause sudden pain in the upper right side of your belly. Pain might spread to your right shoulder or your chest. If this happens, contact your doctor.  If you have sudden pain in the upper right side of your belly that lasts for more than 2 hours, get help right away. This information is not intended to replace advice given to you by your health care provider. Make sure you discuss any questions you have with your health care provider. Document Released: 04/13/2008 Document Revised: 07/12/2016 Document Reviewed: 07/12/2016 Elsevier Interactive Patient Education  2017 [employer] Inc.   Cholecystitis Cholecystitis is inflammation of the gallbladder. It is often called a gallbladder attack. The gallbladder is a pear-shaped organ that lies beneath the liver on the right side of the body. The gallbladder stores bile, which is a fluid that helps the body to digest fats. If bile builds up in your gallbladder, your gallbladder becomes inflamed. This condition may occur suddenly (be acute). Repeat episodes of acute cholecystitis or prolonged episodes may lead to a long-term (chronic) condition. Cholecystitis is serious and it requires treatment. What are the causes? The most common cause of this condition is gallstones. Gallstones can block the tube (duct) that carries bile out of your gallbladder. This causes bile to build up. Other causes of this condition include:  Damage to the gallbladder due to a decrease in blood flow.  Infections in the bile ducts.  Scars or kinks in the bile ducts.  Tumors in the liver, pancreas, or gallbladder. What increases the risk? This condition is more likely to develop in:  People who have sickle cell disease.  People who take  birth control pills or use estrogen.  People who have alcoholic liver disease.  People who have liver cirrhosis.  People who have their nutrition delivered through a vein (parenteral nutrition).  People who do not eat or drink (do fasting) for a long period of time.  People who are obese.  People who have rapid weight loss.  People who are pregnant.  People who have increased triglyceride levels.  People who have pancreatitis. What are the signs or symptoms? Symptoms of this condition include:  Abdominal pain, especially in the upper right area of the abdomen.  Abdominal tenderness or bloating.  Nausea.  Vomiting.  Fever.  Chills.  Yellowing of the skin and the whites of the eyes (jaundice). How is this diagnosed? This condition is diagnosed with a medical history and physical exam. You may also have other tests, including:  Imaging tests, such as:  An ultrasound of the gallbladder.  A CT scan of the abdomen.  A gallbladder nuclear scan (HIDA scan). This scan allows your health care provider to see the bile moving from your liver to  your gallbladder and to your small intestine.  MRI.  Blood tests, such as:  A complete blood count, because the white blood cell count may be higher than normal.  Liver function tests, because some levels may be higher than normal with certain types of gallstones. How is this treated? Treatment may include:  Fasting for a certain amount of time.  IV fluids.  Medicine to treat pain or vomiting.  Antibiotic medicine.  Surgery to remove your gallbladder (cholecystectomy). This may happen immediately or at a later time. Follow these instructions at home: Home care will depend on your treatment. In general:  Take over-the-counter and prescription medicines only as told by your health care provider.  If you were prescribed an antibiotic medicine, take it as told by your health care provider. Do not stop taking the antibiotic  even if you start to feel better.  Follow instructions from your health care provider about what to eat or drink. When you are allowed to eat, avoid eating or drinking anything that triggers your symptoms.  Keep all follow-up visits as told by your health care provider. This is important. Contact a health care provider if:  Your pain is not controlled with medicine.  You have a fever. Get help right away if:  Your pain moves to another part of your abdomen or to your back.  You continue to have symptoms or you develop new symptoms even with treatment. This information is not intended to replace advice given to you by your health care provider. Make sure you discuss any questions you have with your health care provider. Document Released: 10/26/2005 Document Revised: 03/05/2016 Document Reviewed: 02/06/2015 Elsevier Interactive Patient Education  2017 [employer]

## 2016-10-28 NOTE — Progress Notes (Signed)
Pre visit review using our clinic review tool, if applicable. No additional management support is needed unless otherwise documented below in the visit note. 

## 2016-10-28 NOTE — Progress Notes (Signed)
Subjective:  Patient ID: Stacey Holland, female    DOB: [birth year]  Age: 20 y.o. MRN: [id number]  CC: Follow-up (ER follow up for gallblader attack. still in pain on abdominal-pain med doesnt work/has surgery appt tomorrow)   HPI  Hospital follow up (RUQ pain secondary to cholethiasis without cholecystitis): She has appt with surgeon tomorrow. She states hydrocodone is not managing pain. Pain is worse with fatty food intake. Also has nausea, no emesis, no diarrhea.  Indication for acute opioid: RUQ pain secondary to cholelithiasis Medication and dose: hydrocodone 5/325 # pills per month: 10 Last UDS date: none Pain contract signed (Y/N): no Date narcotic database last reviewed (include red flags): 10/28/2016, no red flags.  Myalgia and arthralgia: Urine cortisol was not performed as ordered. Patient was given wrong specimen collection kit. She has an upcoming appt with Dr. Aundria Holland (rheumalotogist) 11/12/16.  Outpatient Medications Prior to Visit  Medication Sig Dispense Refill  . acetaminophen (TYLENOL) 500 MG tablet Take 1,000 mg by mouth every 6 (six) hours as needed for moderate pain.    . magnesium oxide (MAG-OX) 400 (241.3 Mg) MG tablet Take 1 tablet (400 mg total) by mouth 2 (two) times daily. 30 tablet 0  . methocarbamol (ROBAXIN) 500 MG tablet Take 1 tablet (500 mg total) by mouth every 8 (eight) hours as needed for muscle spasms. 30 tablet 0  . metoCLOPramide (REGLAN) 5 MG tablet Take 1 tablet (5 mg total) by mouth 3 (three) times daily before meals. 90 tablet 0  . Multiple Vitamin (MULTIVITAMIN WITH MINERALS) TABS tablet Take 1 tablet by mouth daily.    . Norgestimate-Ethinyl Estradiol Triphasic (TRI-PREVIFEM) 0.18/0.215/0.25 MG-35 MCG tablet Take 1 tablet by mouth at bedtime.    [name] omeprazole (PRILOSEC) 20 MG capsule Take 20 mg by mouth at bedtime.    . potassium chloride SA (K-DUR,KLOR-CON) 20 MEQ tablet Take 1 tablet (20 mEq total) by mouth daily. 15 tablet 0  .  HYDROcodone-acetaminophen (NORCO/VICODIN) 5-325 MG tablet Take 1 tablet by mouth every 4 (four) hours as needed. 10 tablet 0  . ondansetron (ZOFRAN ODT) 4 MG disintegrating tablet Take 1 tablet (4 mg total) by mouth every 8 (eight) hours as needed for nausea. 6 tablet 0   No facility-administered medications prior to visit.    Reviewed labs and radiology reports done 10/24/16  ROS See HPI  Objective:  BP 122/84   Pulse 93   Temp 98.4 F (36.9 C)   Ht '5\' 4"'$  (1.626 m)   Wt 175 lb (79.4 kg)   LMP 10/18/2016   SpO2 98%   BMI 30.04 kg/m   BP Readings from Last 3 Encounters:  10/28/16 122/84  10/24/16 117/69  10/22/16 112/84    Wt Readings from Last 3 Encounters:  10/28/16 175 lb (79.4 kg)  10/24/16 175 lb (79.4 kg)  10/22/16 175 lb (79.4 kg)    Physical Exam  Constitutional: She is oriented to person, place, and time. No distress.  HENT:  Mouth/Throat: Oropharynx is clear and moist.  Cardiovascular: Normal rate.   Pulmonary/Chest: Effort normal and breath sounds normal.  Abdominal: She exhibits no distension. There is tenderness. There is guarding.  Right sided ABD pain with guarding.  Musculoskeletal: Normal range of motion. She exhibits no edema.  Neurological: She is alert and oriented to person, place, and time.  Skin: Skin is warm and dry. No erythema.  Vitals reviewed.   Lab Results  Component Value Date   WBC 8.0 10/24/2016   HGB  13.0 10/24/2016   HCT 38.8 10/24/2016   PLT 251 10/24/2016   GLUCOSE 98 10/28/2016   CHOL 196 07/21/2016   TRIG 209 (H) 07/21/2016   HDL 39 (L) 07/21/2016   LDLCALC 115 (H) 07/21/2016   ALT 26 10/24/2016   AST 83 (H) 10/24/2016   NA 140 10/28/2016   K 4.0 10/28/2016   CL 107 10/28/2016   CREATININE 0.73 10/28/2016   BUN 6 10/28/2016   CO2 25 10/28/2016   TSH 0.44 10/22/2016   HGBA1C 5.2 07/27/2016    US Abdomen Complete  Result Date: 10/24/2016 CLINICAL DATA:  Right upper quadrant pain EXAM: ABDOMEN ULTRASOUND  COMPLETE COMPARISON:  CT 09/01/2016 FINDINGS: Gallbladder: Sludge and small stones within the gallbladder. The largest stone is 4 mm. No wall thickening. The patient was tender over the gallbladder during the study. Common bile duct: Diameter: Normal caliber, 3 mm Liver: No focal lesion identified. Within normal limits in parenchymal echogenicity. IVC: No abnormality visualized. Pancreas: Visualized portion unremarkable. Spleen: Size and appearance within normal limits. Right Kidney: Length: 11.0 cm. Echogenicity within normal limits. No mass or hydronephrosis visualized. Left Kidney: Length: 12.8 cm. Echogenicity within normal limits. No mass or hydronephrosis visualized. Abdominal aorta: No aneurysm visualized. Other findings: None. IMPRESSION: Small stones and sludge within the gallbladder. The patient was tender over the gallbladder during the study. Cannot exclude early acute cholecystitis. Electronically Signed   By: Rolm Baptise M.D.   On: 10/24/2016 08:28    Assessment & Plan:   Stacey Holland was seen today for follow-up.  Diagnoses and all orders for this visit:  Right upper quadrant pain -     traMADol (ULTRAM) 50 MG tablet; Take 2 tablets (100 mg total) by mouth every 6 (six) hours as needed for severe pain.  Calculus of gallbladder without cholecystitis without obstruction -     traMADol (ULTRAM) 50 MG tablet; Take 2 tablets (100 mg total) by mouth every 6 (six) hours as needed for severe pain.  Nausea without vomiting -     promethazine (PHENERGAN) 12.5 MG tablet; Take 1 tablet (12.5 mg total) by mouth every 8 (eight) hours as needed for nausea or vomiting.   I have discontinued Stacey Holland's ondansetron and HYDROcodone-acetaminophen. I am also having her start on traMADol and promethazine. Additionally, I am having her maintain her omeprazole, Norgestimate-Ethinyl Estradiol Triphasic, multivitamin with minerals, acetaminophen, metoCLOPramide, methocarbamol, magnesium oxide, and potassium  chloride SA.  Meds ordered this encounter  Medications  . traMADol (ULTRAM) 50 MG tablet    Sig: Take 2 tablets (100 mg total) by mouth every 6 (six) hours as needed for severe pain.    Dispense:  16 tablet    Refill:  0    Order Specific Question:   Supervising Provider    Answer:   Cassandria Anger [[id number]]  . promethazine (PHENERGAN) 12.5 MG tablet    Sig: Take 1 tablet (12.5 mg total) by mouth every 8 (eight) hours as needed for nausea or vomiting.    Dispense:  20 tablet    Refill:  0    Order Specific Question:   Supervising Provider    Answer:   Cassandria Anger [[id number]]    Follow-up: No Follow-up on file.  Wilfred Lacy, NP

## 2016-10-28 NOTE — Progress Notes (Signed)
Normal results, see office note

## 2016-10-29 ENCOUNTER — Other Ambulatory Visit: Payer: Self-pay | Admitting: General Surgery

## 2016-10-29 ENCOUNTER — Other Ambulatory Visit: Payer: BLUE CROSS/BLUE SHIELD

## 2016-10-29 ENCOUNTER — Ambulatory Visit: Payer: Self-pay | Admitting: General Surgery

## 2016-10-29 DIAGNOSIS — E878 Other disorders of electrolyte and fluid balance, not elsewhere classified: Secondary | ICD-10-CM

## 2016-10-29 DIAGNOSIS — M791 Myalgia, unspecified site: Secondary | ICD-10-CM

## 2016-10-29 DIAGNOSIS — R8290 Unspecified abnormal findings in urine: Secondary | ICD-10-CM

## 2016-10-29 DIAGNOSIS — R531 Weakness: Secondary | ICD-10-CM

## 2016-10-29 DIAGNOSIS — R5381 Other malaise: Secondary | ICD-10-CM

## 2016-10-29 NOTE — H&P (Signed)
History of Present Illness Axel Filler(Gwenetta Devos MD; 10/29/2016 10:01 AM) The patient is a 20 year old female who presents for evaluation of gall stones. The patient is a 20 year old female who has been here previously secondary to left upper quadrant pain. Patient subsequently had repeat right upper quadrant pain that radiates to the back. Patient was seen ER at Hospital. Patient underwent EGD by Dr. Sharla KidneyPirtle. This revealed normal intragastric findings. Patient did have ultrasound with known gallstones. She does state that at that time prior to her episode patient did have cream cheese. She states that she underwent right  Patient returns to discuss cholecystectomy.   Allergies Timmothy Euler(Sade [place]; 10/29/2016 9:17 AM) PINEAPPLE  Swelling, Rash, Itching. Dyes   Medication History Timmothy Euler(Sade [place], [place]; 10/29/2016 9:18 AM) Hydrocod Polst-CPM Polst ER (10-8MG /5ML Suspension ER, Oral as needed) Active. Benzonatate (100MG  Capsule, Oral daily) Active. Klor-Con M20 (20MEQ Tablet ER, Oral as needed) Active. Ondansetron (4MG  Tablet Disint, Oral as needed) Active. Omeprazole (20MG  Capsule DR, Oral) Active. Biotin (10000MCG Tablet Disint, Oral daily) Active. Tri-Previfem (0.18/0.215/0.25MG -35 MCG Tablet, Oral daily) Active. PriLOSEC (20MG  Capsule DR, Oral daily) Active. Zofran (4MG  Tablet, Oral as needed) Active. Medications Reconciled    Review of Systems Axel Filler(Zakkiyya Barno, MD; 10/29/2016 10:0 AM) General Present- Feeling well. Not Present- Fever. Respiratory Not Present- Cough and Difficulty Breathing. Cardiovascular Not Present- Chest Pain. Gastrointestinal Present- Abdominal Pain and Nausea. Musculoskeletal Not Present- Myalgia. Neurological Not Present- Weakness.  Vitals Barron Alvine(Sade Bradford CMA; 10/29/2016 9:19 AM) 10/29/2016 9:18 AM Weight: 177.2 lb Height: 64in Body Surface Area: 1.86 m Body Mass Index: 30.42 kg/m  Temp.: 99.65F  Pulse: 98 (Regular)  BP: 120/82  (Sitting, Left Arm, Standard)       Physical Exam Axel Filler(Gerrit Rafalski, MD; 10/29/2016 10:0 AM) General Mental Status-Alert. General Appearance-Consistent with stated age. Hydration-Well hydrated. Voice-Normal.  Head and Neck Head-normocephalic, atraumatic with no lesions or palpable masses.  Eye Eyeball - Bilateral-Extraocular movements intact. Sclera/Conjunctiva - Bilateral-No scleral icterus.  Chest and Lung Exam Chest and lung exam reveals -quiet, even and easy respiratory effort with no use of accessory muscles. Inspection Chest Wall - Normal. Back - normal.  Cardiovascular Cardiovascular examination reveals -normal heart sounds, regular rate and rhythm with no murmurs.  Abdomen Inspection Normal Exam - No Hernias. Palpation/Percussion Normal exam - Soft, Non Tender, No Rebound tenderness, No Rigidity (guarding) and No hepatosplenomegaly. Auscultation Normal exam - Bowel sounds normal.  Neurologic Neurologic evaluation reveals -alert and oriented x 3 with no impairment of recent or remote memory. Mental Status-Normal.  Musculoskeletal Normal Exam - Left-Upper Extremity Strength Normal and Lower Extremity Strength Normal. Normal Exam - Right-Upper Extremity Strength Normal, Lower Extremity Weakness.    Assessment & Plan Axel Filler(Tarisa Paola MD; 10/29/2016 10:02 AM) SYMPTOMATIC CHOLELITHIASIS (K80.20) Impression: 20 year old female with significant cholelithiasis  1. We will proceed to the operating room for a laparoscopic cholecystectomy  2. Risks and benefits were discussed with the patient to generally include, but not limited to: infection, bleeding, possible need for post op ERCP, damage to the bile ducts, bile leak, and possible need for further surgery. Alternatives were offered and described. All questions were answered and the patient voiced understanding of the procedure and wishes to proceed at this point with a laparoscopic  cholecystectomy

## 2016-11-03 ENCOUNTER — Other Ambulatory Visit: Payer: Self-pay | Admitting: Hematology

## 2016-11-03 DIAGNOSIS — D72820 Lymphocytosis (symptomatic): Secondary | ICD-10-CM

## 2016-11-05 LAB — CORTISOL, URINE, 24 HOUR
CORTISOL (UR), FREE: 7.8 ug/(24.h) (ref 4.0–50.0)
RESULTS RECEIVED: 1.76 g/(24.h) (ref 0.63–2.50)

## 2016-11-05 NOTE — Progress Notes (Signed)
Normal results, see office note

## 2016-11-09 ENCOUNTER — Other Ambulatory Visit: Payer: Self-pay | Admitting: Internal Medicine

## 2016-11-11 ENCOUNTER — Other Ambulatory Visit (INDEPENDENT_AMBULATORY_CARE_PROVIDER_SITE_OTHER): Payer: BLUE CROSS/BLUE SHIELD

## 2016-11-11 ENCOUNTER — Ambulatory Visit (INDEPENDENT_AMBULATORY_CARE_PROVIDER_SITE_OTHER): Payer: BLUE CROSS/BLUE SHIELD | Admitting: Internal Medicine

## 2016-11-11 ENCOUNTER — Encounter: Payer: Self-pay | Admitting: Internal Medicine

## 2016-11-11 VITALS — BP 128/80 | HR 92 | Temp 98.7°F | Resp 16 | Ht 64.0 in | Wt 169.0 lb

## 2016-11-11 DIAGNOSIS — I10 Essential (primary) hypertension: Secondary | ICD-10-CM | POA: Diagnosis not present

## 2016-11-11 DIAGNOSIS — F5089 Other specified eating disorder: Secondary | ICD-10-CM

## 2016-11-11 DIAGNOSIS — D72820 Lymphocytosis (symptomatic): Secondary | ICD-10-CM

## 2016-11-11 DIAGNOSIS — E876 Hypokalemia: Secondary | ICD-10-CM

## 2016-11-11 LAB — CBC WITH DIFFERENTIAL/PLATELET
Basophils Absolute: 0 10*3/uL (ref 0.0–0.1)
Basophils Relative: 0.5 % (ref 0.0–3.0)
EOS PCT: 1.3 % (ref 0.0–5.0)
Eosinophils Absolute: 0.1 10*3/uL (ref 0.0–0.7)
HCT: 34.5 % — ABNORMAL LOW (ref 36.0–46.0)
Hemoglobin: 11.6 g/dL — ABNORMAL LOW (ref 12.0–15.0)
Lymphocytes Relative: 53.4 % — ABNORMAL HIGH (ref 12.0–46.0)
Lymphs Abs: 3 10*3/uL (ref 0.7–4.0)
MCHC: 33.5 g/dL (ref 30.0–36.0)
MCV: 78 fl (ref 78.0–100.0)
MONO ABS: 0.4 10*3/uL (ref 0.1–1.0)
MONOS PCT: 6.3 % (ref 3.0–12.0)
NEUTROS ABS: 2.2 10*3/uL (ref 1.4–7.7)
NEUTROS PCT: 38.5 % — AB (ref 43.0–77.0)
PLATELETS: 267 10*3/uL (ref 150.0–400.0)
RBC: 4.42 Mil/uL (ref 3.87–5.11)
RDW: 14.6 % (ref 11.5–14.6)
WBC: 5.7 10*3/uL (ref 4.5–10.5)

## 2016-11-11 LAB — COMPREHENSIVE METABOLIC PANEL
ALK PHOS: 39 U/L (ref 39–117)
ALT: 11 U/L (ref 0–35)
AST: 14 U/L (ref 0–37)
Albumin: 3.3 g/dL — ABNORMAL LOW (ref 3.5–5.2)
BUN: 7 mg/dL (ref 6–23)
CO2: 27 meq/L (ref 19–32)
Calcium: 9.4 mg/dL (ref 8.4–10.5)
Chloride: 103 mEq/L (ref 96–112)
Creatinine, Ser: 0.8 mg/dL (ref 0.40–1.20)
GFR: 96.3 mL/min (ref >60.00)
GLUCOSE: 80 mg/dL (ref 70–99)
POTASSIUM: 3.9 meq/L (ref 3.5–5.1)
SODIUM: 140 meq/L (ref 135–145)
TOTAL PROTEIN: 6.9 g/dL (ref 6.0–8.3)
Total Bilirubin: 0.5 mg/dL (ref 0.2–1.2)

## 2016-11-11 LAB — MAGNESIUM: MAGNESIUM: 1.7 mg/dL (ref 1.5–2.5)

## 2016-11-11 LAB — CORTISOL: Cortisol, Plasma: 13.4 ug/dL

## 2016-11-11 NOTE — Progress Notes (Signed)
Pre visit review using our clinic review tool, if applicable. No additional management support is needed unless otherwise documented below in the visit note. 

## 2016-11-11 NOTE — Patient Instructions (Signed)
Hypertension Hypertension, commonly called high blood pressure, is when the force of blood pumping through your arteries is too strong. Your arteries are the blood vessels that carry blood from your heart throughout your body. A blood pressure reading consists of a higher number over a lower number, such as 110/72. The higher number (systolic) is the pressure inside your arteries when your heart pumps. The lower number (diastolic) is the pressure inside your arteries when your heart relaxes. Ideally you want your blood pressure below 120/80. Hypertension forces your heart to work harder to pump blood. Your arteries may become narrow or stiff. Having untreated or uncontrolled hypertension can cause heart attack, stroke, kidney disease, and other problems. What increases the risk? Some risk factors for high blood pressure are controllable. Others are not. Risk factors you cannot control include:  Race. You may be at higher risk if you are African American.  Age. Risk increases with age.  Gender. Men are at higher risk than women before age 45 years. After age 65, women are at higher risk than men. Risk factors you can control include:  Not getting enough exercise or physical activity.  Being overweight.  Getting too much fat, sugar, calories, or salt in your diet.  Drinking too much alcohol. What are the signs or symptoms? Hypertension does not usually cause signs or symptoms. Extremely high blood pressure (hypertensive crisis) may cause headache, anxiety, shortness of breath, and nosebleed. How is this diagnosed? To check if you have hypertension, your health care provider will measure your blood pressure while you are seated, with your arm held at the level of your heart. It should be measured at least twice using the same arm. Certain conditions can cause a difference in blood pressure between your right and left arms. A blood pressure reading that is higher than normal on one occasion does  not mean that you need treatment. If it is not clear whether you have high blood pressure, you may be asked to return on a different day to have your blood pressure checked again. Or, you may be asked to monitor your blood pressure at home for 1 or more weeks. How is this treated? Treating high blood pressure includes making lifestyle changes and possibly taking medicine. Living a healthy lifestyle can help lower high blood pressure. You may need to change some of your habits. Lifestyle changes may include:  Following the DASH diet. This diet is high in fruits, vegetables, and whole grains. It is low in salt, red meat, and added sugars.  Keep your sodium intake below 2,300 mg per day.  Getting at least 30-45 minutes of aerobic exercise at least 4 times per week.  Losing weight if necessary.  Not smoking.  Limiting alcoholic beverages.  Learning ways to reduce stress. Your health care provider may prescribe medicine if lifestyle changes are not enough to get your blood pressure under control, and if one of the following is true:  You are 18-59 years of age and your systolic blood pressure is above 140.  You are 60 years of age or older, and your systolic blood pressure is above 150.  Your diastolic blood pressure is above 90.  You have diabetes, and your systolic blood pressure is over 140 or your diastolic blood pressure is over 90.  You have kidney disease and your blood pressure is above 140/90.  You have heart disease and your blood pressure is above 140/90. Your personal target blood pressure may vary depending on your medical   conditions, your age, and other factors. Follow these instructions at home:  Have your blood pressure rechecked as directed by your health care provider.  Take medicines only as directed by your health care provider. Follow the directions carefully. Blood pressure medicines must be taken as prescribed. The medicine does not work as well when you skip  doses. Skipping doses also puts you at risk for problems.  Do not smoke.  Monitor your blood pressure at home as directed by your health care provider. Contact a health care provider if:  You think you are having a reaction to medicines taken.  You have recurrent headaches or feel dizzy.  You have swelling in your ankles.  You have trouble with your vision. Get help right away if:  You develop a severe headache or confusion.  You have unusual weakness, numbness, or feel faint.  You have severe chest or abdominal pain.  You vomit repeatedly.  You have trouble breathing. This information is not intended to replace advice given to you by your health care provider. Make sure you discuss any questions you have with your health care provider. Document Released: 10/26/2005 Document Revised: 04/02/2016 Document Reviewed: 08/18/2013 Elsevier Interactive Patient Education  2017 Elsevier Inc.  

## 2016-11-11 NOTE — Progress Notes (Signed)
Subjective:  Patient ID: Stacey Holland, female    DOB: 10/27/1996  Age: 21 y.o. MRN: [id number]  CC: Hypertension   HPI Rudi Coco Strauch presents for follow-up on hypertension. She continues to lose weight and complains of fatigue and weakness. She has not taken her potassium supplement for 3 days. She complains of constipation and gets symptom relief with MiraLAX. She has had intermittent episodes of abdominal pain and is scheduled for a cholecystectomy within the next few weeks. She has had a few episodes of nausea but no vomiting. She continues to lose weight and is manipulative about her food intake. She tells me that she can tolerate liquids okay but she is not interested in eating much food. 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 I asked the patient and her mother today if they think that she has an eating disorder as I do and they both say no she doesn't have an eating disorder and they are not interested in having her evaluated for this.  Outpatient Medications Prior to Visit  Medication Sig Dispense Refill  . magnesium oxide (MAG-OX) 400 (241.3 Mg) MG tablet Take 1 tablet (400 mg total) by mouth 2 (two) times daily. (Patient not taking: Reported on 11/12/2016) 30 tablet 0  . Norgestimate-Ethinyl Estradiol Triphasic (TRI-PREVIFEM) 0.18/0.215/0.25 MG-35 MCG tablet Take 1 tablet by mouth at bedtime.    [name] omeprazole (PRILOSEC) 20 MG capsule Take 20 mg by mouth at bedtime.    . potassium chloride SA (K-DUR,KLOR-CON) 20 MEQ tablet Take 1 tablet (20 mEq total) by mouth daily. (Patient not taking: Reported on 11/12/2016) 15 tablet 0  . traMADol (ULTRAM) 50 MG tablet Take 2 tablets (100 mg total) by mouth every 6 (six) hours as needed for severe pain. 16 tablet 0  . acetaminophen (TYLENOL) 500 MG tablet Take 1,000 mg by mouth every 6 (six) hours as needed for moderate pain.    . Multiple Vitamin (MULTIVITAMIN WITH MINERALS) TABS tablet Take 1 tablet by  mouth daily.    . promethazine (PHENERGAN) 12.5 MG tablet Take 1 tablet (12.5 mg total) by mouth every 8 (eight) hours as needed for nausea or vomiting. 20 tablet 0  . methocarbamol (ROBAXIN) 500 MG tablet Take 1 tablet (500 mg total) by mouth every 8 (eight) hours as needed for muscle spasms. 30 tablet 0  . metoCLOPramide (REGLAN) 5 MG tablet Take 1 tablet (5 mg total) by mouth 3 (three) times daily before meals. 90 tablet 0   No facility-administered medications prior to visit.     ROS Review of Systems  Constitutional: Positive for appetite change, fatigue and unexpected weight change. Negative for activity change, chills, diaphoresis and fever.  HENT: Negative for trouble swallowing and voice change.   Eyes: Negative.   Respiratory: Negative.  Negative for cough, choking, shortness of breath and wheezing.   Cardiovascular: Negative.  Negative for chest pain, palpitations and leg swelling.  Gastrointestinal: Positive for abdominal pain, constipation and nausea. Negative for diarrhea and vomiting.  Endocrine: Negative.  Negative for polydipsia, polyphagia and polyuria.  Genitourinary: Negative.  Negative for decreased urine volume, difficulty urinating and urgency.  Musculoskeletal: Negative.  Negative for arthralgias, back pain, myalgias and neck pain.  Skin: Negative.   Allergic/Immunologic: Negative.   Neurological: Positive for weakness. Negative for dizziness, syncope, numbness and headaches.  Hematological: Negative.  Negative for adenopathy. Does not bruise/bleed easily.  Psychiatric/Behavioral: Negative.     Objective:  BP 128/80 (  BP Location: Left Arm, Patient Position: Sitting, Cuff Size: Normal)   Pulse 92   Temp 98.7 F (37.1 C) (Oral)   Ht 5\' 4"  (1.626 m)   Wt 169 lb (76.7 kg)   LMP 10/18/2016   SpO2 98%   BMI 29.01 kg/m   BP Readings from Last 3 Encounters:  11/11/16 128/80  10/28/16 122/84  10/24/16 117/69    Wt Readings from Last 3 Encounters:  11/11/16  169 lb (76.7 kg)  10/28/16 175 lb (79.4 kg)  10/24/16 175 lb (79.4 kg)    Physical Exam  Constitutional: She is oriented to person, place, and time.  Non-toxic appearance. She does not have a sickly appearance. She does not appear ill. No distress.  HENT:  Nose: Nose normal.  Mouth/Throat: Oropharynx is clear and moist. No oropharyngeal exudate.  Eyes: Conjunctivae are normal. Right eye exhibits no discharge. Left eye exhibits no discharge. No scleral icterus.  Neck: Normal range of motion. Neck supple. No JVD present. No tracheal deviation present. No thyromegaly present.  Cardiovascular: Normal rate, regular rhythm, normal heart sounds and intact distal pulses.  Exam reveals no gallop and no friction rub.   No murmur heard. Pulmonary/Chest: Effort normal and breath sounds normal. No stridor. No respiratory distress. She has no wheezes. She has no rales. She exhibits no tenderness.  Abdominal: Soft. Bowel sounds are normal. She exhibits no distension and no mass. There is no tenderness. There is no rebound and no guarding.  Musculoskeletal: Normal range of motion. She exhibits no edema, tenderness or deformity.  Lymphadenopathy:    She has no cervical adenopathy.  Neurological: She is oriented to person, place, and time.  Skin: Skin is warm and dry. No rash noted. She is not diaphoretic. No erythema. No pallor.  Psychiatric: She has a normal mood and affect. Her behavior is normal. Judgment and thought content normal.  Vitals reviewed.   Lab Results  Component Value Date   WBC 5.7 11/11/2016   HGB 11.6 (L) 11/11/2016   HCT 34.5 (L) 11/11/2016   PLT 267.0 11/11/2016   GLUCOSE 80 11/11/2016   CHOL 196 07/21/2016   TRIG 209 (H) 07/21/2016   HDL 39 (L) 07/21/2016   LDLCALC 115 (H) 07/21/2016   ALT 11 11/11/2016   AST 14 11/11/2016   NA 140 11/11/2016   K 3.9 11/11/2016   CL 103 11/11/2016   CREATININE 0.80 11/11/2016   BUN 7 11/11/2016   CO2 27 11/11/2016   TSH 0.44  10/22/2016   HGBA1C 5.2 07/27/2016    [place] Abdomen Complete  Result Date: 10/24/2016 CLINICAL DATA:  Right upper quadrant pain EXAM: ABDOMEN ULTRASOUND COMPLETE COMPARISON:  CT 09/01/2016 FINDINGS: Gallbladder: Sludge and small stones within the gallbladder. The largest stone is 4 mm. No wall thickening. The patient was tender over the gallbladder during the study. Common bile duct: Diameter: Normal caliber, 3 mm Liver: No focal lesion identified. Within normal limits in parenchymal echogenicity. IVC: No abnormality visualized. Pancreas: Visualized portion unremarkable. Spleen: Size and appearance within normal limits. Right Kidney: Length: 11.0 cm. Echogenicity within normal limits. No mass or hydronephrosis visualized. Left Kidney: Length: 12.8 cm. Echogenicity within normal limits. No mass or hydronephrosis visualized. Abdominal aorta: No aneurysm visualized. Other findings: None. IMPRESSION: Small stones and sludge within the gallbladder. The patient was tender over the gallbladder during the study. Cannot exclude early acute cholecystitis. Electronically Signed   By: Charlett NoseKevin  Dover M.D.   On: 10/24/2016 08:28    Assessment &  Plan:   Lynnell Grain was seen today for hypertension.  Diagnoses and all orders for this visit:  Hypokalemia- her potassium level is in the low/normal range. I have asked her to continue taking the potassium supplement as directed. -     Comprehensive metabolic panel; Future -     Magnesium; Future -     Cortisol; Future  Hypomagnesemia- her magnesium level is in the normal range, I have asked her to continue taking the magnesium supplement. -     Comprehensive metabolic panel; Future -     Magnesium; Future  Atypical lymphocytosis- this is stable, there is no evidence of lymphoproliferative disease, will continue to monitor this. -     CBC with Differential/Platelet; Future  Psychogenic vomiting with nausea- I think she has an eating disorder, the patient and her mother do  not agree with this and are not interested in having her evaluated for it. I've asked him to let me know if they change their mind and I will refer them to an eating disorder center.  Essential hypertension- her blood pressure is adequately well-controlled, I do not recommend an antihypertensive agent at this time. -     Comprehensive metabolic panel; Future -     Cortisol; Future   I have discontinued Ms. Bornemann's multivitamin with minerals, metoCLOPramide, methocarbamol, and promethazine. I am also having her maintain her omeprazole, Norgestimate-Ethinyl Estradiol Triphasic, magnesium oxide, potassium chloride SA, and traMADol.  No orders of the defined types were placed in this encounter.    Follow-up: Return in about 3 months (around 02/09/2017).  Sanda Linger, MD

## 2016-11-12 ENCOUNTER — Encounter: Payer: Self-pay | Admitting: Internal Medicine

## 2016-11-13 ENCOUNTER — Other Ambulatory Visit: Payer: Self-pay | Admitting: Hematology

## 2016-11-16 ENCOUNTER — Other Ambulatory Visit: Payer: Self-pay | Admitting: *Deleted

## 2016-11-18 ENCOUNTER — Other Ambulatory Visit: Payer: Self-pay | Admitting: Hematology

## 2016-11-18 DIAGNOSIS — D72820 Lymphocytosis (symptomatic): Secondary | ICD-10-CM

## 2016-11-18 NOTE — Pre-Procedure Instructions (Signed)
    Stacey Holland  11/18/2016      [id number] [name]- [place], [name] Phone: [phone] Fax: [phone]    Your procedure is scheduled on Wed. Jan. 17  Report to [facility] Admitting at 6:30A.M.  Call this number if you have problems the morning of surgery:  [phone]   Remember:  Do not eat food or drink liquids after midnight on Tues. Jan. 16   Take these medicines the morning of surgery with A SIP OF WATER : phenergan, tramadol if needed                 1 week prior to surgery stop aspirin, advil, motrin, ibuprofen, goody's, BC Powders, vitamins/herbal medicines.   Do not wear jewelry, make-up or nail polish.  Do not wear lotions, powders, or perfumes, or deoderant.  Do not shave 48 hours prior to surgery.  Men may shave face and neck.  Do not bring valuables to the hospital.  [facility] is not responsible for any belongings or valuables.  Contacts, dentures or bridgework may not be worn into surgery.  Leave your suitcase in the car.  After surgery it may be brought to your room.  For patients admitted to the hospital, discharge time will be determined by your treatment team.  Patients discharged the day of surgery will not be allowed to drive home.   Name and phone number of your driver:    Special instructions:  Review preparing for surgery  Please read over the following fact sheets that you were given. Coughing and Deep Breathing

## 2016-11-19 ENCOUNTER — Encounter (HOSPITAL_COMMUNITY)
Admission: RE | Admit: 2016-11-19 | Discharge: 2016-11-19 | Disposition: A | Discharge status: Home / Self Care | Payer: BLUE CROSS/BLUE SHIELD | Source: Ambulatory Visit | Attending: General Surgery | Admitting: General Surgery

## 2016-11-19 ENCOUNTER — Encounter (HOSPITAL_COMMUNITY): Payer: Self-pay

## 2016-11-19 DIAGNOSIS — Z01818 Encounter for other preprocedural examination: Secondary | ICD-10-CM | POA: Diagnosis present

## 2016-11-19 DIAGNOSIS — Z01810 Encounter for preprocedural cardiovascular examination: Secondary | ICD-10-CM | POA: Diagnosis not present

## 2016-11-19 DIAGNOSIS — K8080 Other cholelithiasis without obstruction: Secondary | ICD-10-CM | POA: Insufficient documentation

## 2016-11-19 DIAGNOSIS — I498 Other specified cardiac arrhythmias: Secondary | ICD-10-CM | POA: Diagnosis not present

## 2016-11-19 HISTORY — DX: Gastro-esophageal reflux disease without esophagitis: K21.9

## 2016-11-19 HISTORY — DX: Dyspnea, unspecified: R06.00

## 2016-11-19 HISTORY — DX: Headache: R51

## 2016-11-19 HISTORY — DX: Headache, unspecified: R51.9

## 2016-11-19 LAB — BASIC METABOLIC PANEL
Anion gap: 8 (ref 5–15)
BUN: 7 mg/dL (ref 6–20)
CHLORIDE: 108 mmol/L (ref 101–111)
CO2: 25 mmol/L (ref 22–32)
CREATININE: 0.86 mg/dL (ref 0.44–1.00)
Calcium: 9.3 mg/dL (ref 8.9–10.3)
GFR calc Af Amer: >60 mL/min (ref >60)
GFR calc non Af Amer: >60 mL/min (ref >60)
Glucose, Bld: 79 mg/dL (ref 65–99)
POTASSIUM: 3.6 mmol/L (ref 3.5–5.1)
SODIUM: 141 mmol/L (ref 135–145)

## 2016-11-19 LAB — CBC
HEMATOCRIT: 34.5 % — AB (ref 36.0–46.0)
Hemoglobin: 11.1 g/dL — ABNORMAL LOW (ref 12.0–15.0)
MCH: 25.8 pg — AB (ref 26.0–34.0)
MCHC: 32.2 g/dL (ref 30.0–36.0)
MCV: 80 fL (ref 78.0–100.0)
Platelets: 250 10*3/uL (ref 150–400)
RBC: 4.31 MIL/uL (ref 3.87–5.11)
RDW: 14.4 % (ref 11.5–15.5)
WBC: 5.2 10*3/uL (ref 4.0–10.5)

## 2016-11-19 LAB — HCG, SERUM, QUALITATIVE: Preg, Serum: NEGATIVE

## 2016-11-19 NOTE — Progress Notes (Signed)
PCP: Dr. Sanda Lingerhomas Jones

## 2016-11-25 ENCOUNTER — Encounter (HOSPITAL_COMMUNITY)
Admission: RE | Disposition: A | Discharge status: Home / Self Care | Payer: Self-pay | Source: Ambulatory Visit | Attending: General Surgery

## 2016-11-25 ENCOUNTER — Ambulatory Visit (HOSPITAL_COMMUNITY): Payer: BLUE CROSS/BLUE SHIELD | Admitting: Certified Registered Nurse Anesthetist

## 2016-11-25 ENCOUNTER — Ambulatory Visit (HOSPITAL_COMMUNITY)
Admission: RE | Admit: 2016-11-25 | Discharge: 2016-11-25 | Disposition: A | Discharge status: Home / Self Care | Payer: BLUE CROSS/BLUE SHIELD | Source: Ambulatory Visit | Attending: General Surgery | Admitting: General Surgery

## 2016-11-25 ENCOUNTER — Encounter (HOSPITAL_COMMUNITY): Payer: Self-pay | Admitting: Certified Registered Nurse Anesthetist

## 2016-11-25 DIAGNOSIS — K8010 Calculus of gallbladder with chronic cholecystitis without obstruction: Secondary | ICD-10-CM | POA: Insufficient documentation

## 2016-11-25 DIAGNOSIS — I10 Essential (primary) hypertension: Secondary | ICD-10-CM | POA: Diagnosis not present

## 2016-11-25 DIAGNOSIS — K8020 Calculus of gallbladder without cholecystitis without obstruction: Secondary | ICD-10-CM | POA: Diagnosis present

## 2016-11-25 HISTORY — PX: CHOLECYSTECTOMY: SHX55

## 2016-11-25 SURGERY — LAPAROSCOPIC CHOLECYSTECTOMY
Anesthesia: General | Site: Abdomen

## 2016-11-25 MED ORDER — ROCURONIUM BROMIDE 100 MG/10ML IV SOLN
INTRAVENOUS | Status: DC | PRN
  Administered 2016-11-25: 50 mg via INTRAVENOUS

## 2016-11-25 MED ORDER — FENTANYL CITRATE (PF) 100 MCG/2ML IJ SOLN
INTRAMUSCULAR | Status: DC | PRN
  Administered 2016-11-25: 50 ug via INTRAVENOUS
  Administered 2016-11-25: 100 ug via INTRAVENOUS
  Administered 2016-11-25: 50 ug via INTRAVENOUS

## 2016-11-25 MED ORDER — SODIUM CHLORIDE 0.9 % IV SOLN: 250.0000 mL | INTRAVENOUS | Status: DC | PRN

## 2016-11-25 MED ORDER — LIDOCAINE HCL (CARDIAC) 20 MG/ML IV SOLN
INTRAVENOUS | Status: DC | PRN
  Administered 2016-11-25: 60 mg via INTRATRACHEAL

## 2016-11-25 MED ORDER — MIDAZOLAM HCL 2 MG/2ML IJ SOLN
INTRAMUSCULAR | Status: AC
  Filled 2016-11-25: qty 2

## 2016-11-25 MED ORDER — PROPOFOL 10 MG/ML IV BOLUS
INTRAVENOUS | Status: DC | PRN
  Administered 2016-11-25: 160 mg via INTRAVENOUS

## 2016-11-25 MED ORDER — OXYCODONE HCL 5 MG PO TABS
ORAL_TABLET | ORAL | Status: AC
  Administered 2016-11-25: 10 mg via ORAL
  Filled 2016-11-25: qty 2

## 2016-11-25 MED ORDER — OXYCODONE HCL 5 MG PO TABS
5.0000 mg | ORAL_TABLET | ORAL | Status: DC | PRN
  Administered 2016-11-25: 10 mg via ORAL

## 2016-11-25 MED ORDER — SUGAMMADEX SODIUM 200 MG/2ML IV SOLN
INTRAVENOUS | Status: DC | PRN
  Administered 2016-11-25: 200 mg via INTRAVENOUS

## 2016-11-25 MED ORDER — ACETAMINOPHEN 325 MG PO TABS: 650.0000 mg | ORAL_TABLET | ORAL | Status: DC | PRN

## 2016-11-25 MED ORDER — KETOROLAC TROMETHAMINE 30 MG/ML IJ SOLN
30.0000 mg | Freq: Once | INTRAMUSCULAR | Status: AC | PRN
  Administered 2016-11-25: 30 mg via INTRAVENOUS

## 2016-11-25 MED ORDER — MORPHINE SULFATE (PF) 2 MG/ML IV SOLN
2.0000 mg | INTRAVENOUS | Status: DC | PRN
  Administered 2016-11-25: 2 mg via INTRAVENOUS

## 2016-11-25 MED ORDER — MORPHINE SULFATE (PF) 4 MG/ML IV SOLN
INTRAVENOUS | Status: AC
  Filled 2016-11-25: qty 1

## 2016-11-25 MED ORDER — GLYCOPYRROLATE 0.2 MG/ML IJ SOLN
INTRAMUSCULAR | Status: DC | PRN
  Administered 2016-11-25: 0.2 mg via INTRAVENOUS

## 2016-11-25 MED ORDER — DEXAMETHASONE SODIUM PHOSPHATE 10 MG/ML IJ SOLN
INTRAMUSCULAR | Status: AC
  Filled 2016-11-25: qty 1

## 2016-11-25 MED ORDER — HYDROMORPHONE HCL 2 MG/ML IJ SOLN
INTRAMUSCULAR | Status: AC
  Filled 2016-11-25: qty 1

## 2016-11-25 MED ORDER — FENTANYL CITRATE (PF) 100 MCG/2ML IJ SOLN
INTRAMUSCULAR | Status: AC
  Filled 2016-11-25: qty 4

## 2016-11-25 MED ORDER — LACTATED RINGERS IV SOLN
INTRAVENOUS | Status: DC | PRN
  Administered 2016-11-25 (×2): via INTRAVENOUS

## 2016-11-25 MED ORDER — SODIUM CHLORIDE 0.9% FLUSH: 3.0000 mL | INTRAVENOUS | Status: DC | PRN

## 2016-11-25 MED ORDER — PROPOFOL 10 MG/ML IV BOLUS
INTRAVENOUS | Status: AC
  Filled 2016-11-25: qty 20

## 2016-11-25 MED ORDER — CHLORHEXIDINE GLUCONATE CLOTH 2 % EX PADS: 6.0000 | MEDICATED_PAD | Freq: Once | CUTANEOUS | Status: DC

## 2016-11-25 MED ORDER — ARTIFICIAL TEARS OP OINT
TOPICAL_OINTMENT | OPHTHALMIC | Status: AC
  Filled 2016-11-25: qty 3.5

## 2016-11-25 MED ORDER — 0.9 % SODIUM CHLORIDE (POUR BTL) OPTIME
TOPICAL | Status: DC | PRN
  Administered 2016-11-25: 1000 mL

## 2016-11-25 MED ORDER — MIDAZOLAM HCL 2 MG/2ML IJ SOLN
INTRAMUSCULAR | Status: DC | PRN
  Administered 2016-11-25: 2 mg via INTRAVENOUS

## 2016-11-25 MED ORDER — ONDANSETRON HCL 4 MG/2ML IJ SOLN
INTRAMUSCULAR | Status: AC
  Filled 2016-11-25: qty 2

## 2016-11-25 MED ORDER — KETOROLAC TROMETHAMINE 30 MG/ML IJ SOLN
INTRAMUSCULAR | Status: AC
  Filled 2016-11-25: qty 1

## 2016-11-25 MED ORDER — BUPIVACAINE HCL (PF) 0.25 % IJ SOLN
INTRAMUSCULAR | Status: AC
  Filled 2016-11-25: qty 30

## 2016-11-25 MED ORDER — KETAMINE HCL-SODIUM CHLORIDE 100-0.9 MG/10ML-% IV SOSY
PREFILLED_SYRINGE | INTRAVENOUS | Status: AC
  Filled 2016-11-25: qty 10

## 2016-11-25 MED ORDER — ACETAMINOPHEN 650 MG RE SUPP: 650.0000 mg | RECTAL | Status: DC | PRN

## 2016-11-25 MED ORDER — ONDANSETRON HCL 4 MG/2ML IJ SOLN
INTRAMUSCULAR | Status: DC | PRN
  Administered 2016-11-25: 4 mg via INTRAVENOUS

## 2016-11-25 MED ORDER — HYDROMORPHONE HCL 1 MG/ML IJ SOLN
0.2500 mg | INTRAMUSCULAR | Status: DC | PRN
  Administered 2016-11-25 (×4): 0.5 mg via INTRAVENOUS

## 2016-11-25 MED ORDER — KETAMINE HCL 10 MG/ML IJ SOLN
INTRAMUSCULAR | Status: DC | PRN
  Administered 2016-11-25: 30 mg via INTRAVENOUS
  Administered 2016-11-25 (×2): 20 mg via INTRAVENOUS

## 2016-11-25 MED ORDER — PROMETHAZINE HCL 25 MG/ML IJ SOLN: 6.2500 mg | INTRAMUSCULAR | Status: DC | PRN

## 2016-11-25 MED ORDER — DEXAMETHASONE SODIUM PHOSPHATE 10 MG/ML IJ SOLN
INTRAMUSCULAR | Status: DC | PRN
  Administered 2016-11-25: 10 mg via INTRAVENOUS

## 2016-11-25 MED ORDER — SODIUM CHLORIDE 0.9% FLUSH: 3.0000 mL | Freq: Two times a day (BID) | INTRAVENOUS | Status: DC

## 2016-11-25 MED ORDER — LIDOCAINE 2% (20 MG/ML) 5 ML SYRINGE
INTRAMUSCULAR | Status: AC
  Filled 2016-11-25: qty 5

## 2016-11-25 MED ORDER — BUPIVACAINE HCL 0.25 % IJ SOLN
INTRAMUSCULAR | Status: DC | PRN
  Administered 2016-11-25: 5 mL

## 2016-11-25 MED ORDER — ROCURONIUM BROMIDE 50 MG/5ML IV SOSY
PREFILLED_SYRINGE | INTRAVENOUS | Status: AC
  Filled 2016-11-25: qty 5

## 2016-11-25 MED ORDER — CEFAZOLIN SODIUM-DEXTROSE 2-4 GM/100ML-% IV SOLN
2.0000 g | INTRAVENOUS | Status: AC
  Administered 2016-11-25: 2 g via INTRAVENOUS
  Filled 2016-11-25: qty 100

## 2016-11-25 MED ORDER — OXYCODONE-ACETAMINOPHEN 5-325 MG PO TABS: 1.0000 | ORAL_TABLET | ORAL | 0 refills | Status: DC | PRN

## 2016-11-25 SURGICAL SUPPLY — 40 items
APL SKNCLS STERI-STRIP NONHPOA (GAUZE/BANDAGES/DRESSINGS) ×1
BAG SPEC RTRVL 10 TROC 200 (ENDOMECHANICALS)
BENZOIN TINCTURE PRP APPL 2/3 (GAUZE/BANDAGES/DRESSINGS) ×2 IMPLANT
CANISTER SUCTION 2500CC (MISCELLANEOUS) ×2 IMPLANT
CHLORAPREP W/TINT 26ML (MISCELLANEOUS) ×2 IMPLANT
CLIP LIGATING HEMO O LOK GREEN (MISCELLANEOUS) ×3 IMPLANT
CLSR STERI-STRIP ANTIMIC 1/2X4 (GAUZE/BANDAGES/DRESSINGS) ×1 IMPLANT
COVER SURGICAL LIGHT HANDLE (MISCELLANEOUS) ×2 IMPLANT
COVER TRANSDUCER ULTRASND (DRAPES) ×2 IMPLANT
DEVICE TROCAR PUNCTURE CLOSURE (ENDOMECHANICALS) ×2 IMPLANT
ELECT REM PT RETURN 9FT ADLT (ELECTROSURGICAL) ×2
ELECTRODE REM PT RTRN 9FT ADLT (ELECTROSURGICAL) ×1 IMPLANT
GAUZE SPONGE 2X2 8PLY STRL LF (GAUZE/BANDAGES/DRESSINGS) ×1 IMPLANT
GLOVE BIO SURGEON STRL SZ7.5 (GLOVE) ×2 IMPLANT
GOWN STRL REUS W/ TWL LRG LVL3 (GOWN DISPOSABLE) ×2 IMPLANT
GOWN STRL REUS W/ TWL XL LVL3 (GOWN DISPOSABLE) ×1 IMPLANT
GOWN STRL REUS W/TWL LRG LVL3 (GOWN DISPOSABLE) ×4
GOWN STRL REUS W/TWL XL LVL3 (GOWN DISPOSABLE) ×2
KIT BASIN OR (CUSTOM PROCEDURE TRAY) ×2 IMPLANT
KIT ROOM TURNOVER OR (KITS) ×2 IMPLANT
NDL INSUFFLATION 14GA 120MM (NEEDLE) ×1 IMPLANT
NEEDLE INSUFFLATION 14GA 120MM (NEEDLE) ×2 IMPLANT
NS IRRIG 1000ML POUR BTL (IV SOLUTION) ×2 IMPLANT
PAD ARMBOARD 7.5X6 YLW CONV (MISCELLANEOUS) ×4 IMPLANT
POUCH RETRIEVAL ECOSAC 10 (ENDOMECHANICALS) IMPLANT
POUCH RETRIEVAL ECOSAC 10MM (ENDOMECHANICALS)
SCISSORS LAP 5X35 DISP (ENDOMECHANICALS) ×2 IMPLANT
SET IRRIG TUBING LAPAROSCOPIC (IRRIGATION / IRRIGATOR) ×2 IMPLANT
SLEEVE ENDOPATH XCEL 5M (ENDOMECHANICALS) ×2 IMPLANT
SPECIMEN JAR SMALL (MISCELLANEOUS) ×2 IMPLANT
SPONGE GAUZE 2X2 STER 10/PKG (GAUZE/BANDAGES/DRESSINGS) ×1
STRIP CLOSURE SKIN 1/2X4 (GAUZE/BANDAGES/DRESSINGS) ×2 IMPLANT
SUT MNCRL AB 3-0 PS2 18 (SUTURE) ×2 IMPLANT
TAPE CLOTH SURG 6X10 WHT LF (GAUZE/BANDAGES/DRESSINGS) ×1 IMPLANT
TOWEL OR 17X24 6PK STRL BLUE (TOWEL DISPOSABLE) ×2 IMPLANT
TOWEL OR 17X26 10 PK STRL BLUE (TOWEL DISPOSABLE) ×2 IMPLANT
TRAY LAPAROSCOPIC MC (CUSTOM PROCEDURE TRAY) ×2 IMPLANT
TROCAR XCEL NON-BLD 11X100MML (ENDOMECHANICALS) ×2 IMPLANT
TROCAR XCEL NON-BLD 5MMX100MML (ENDOMECHANICALS) ×2 IMPLANT
TUBING INSUFFLATION (TUBING) ×2 IMPLANT

## 2016-11-25 NOTE — Discharge Instructions (Signed)
[facility] ______[facility] °LAPAROSCOPIC SURGERY: POST OP INSTRUCTIONS °Always review your discharge instruction sheet given to you by the facility where your surgery was performed. °IF YOU HAVE DISABILITY OR FAMILY LEAVE FORMS, YOU MUST BRING THEM TO THE OFFICE FOR PROCESSING.   °DO NOT GIVE THEM TO YOUR DOCTOR. ° °1. A prescription for pain medication may be given to you upon discharge.  Take your pain medication as prescribed, if needed.  If narcotic pain medicine is not needed, then you may take acetaminophen (Tylenol) or ibuprofen (Advil) as needed. °2. Take your usually prescribed medications unless otherwise directed. °3. If you need a refill on your pain medication, please contact your pharmacy.  They will contact our office to request authorization. Prescriptions will not be filled after 5pm or on week-ends. °4. You should follow a light diet the first few days after arrival home, such as soup and crackers, etc.  Be sure to include lots of fluids daily. °5. Most patients will experience some swelling and bruising in the area of the incisions.  Ice packs will help.  Swelling and bruising can take several days to resolve.  °6. It is common to experience some constipation if taking pain medication after surgery.  Increasing fluid intake and taking a stool softener (such as Colace) will usually help or prevent this problem from occurring.  A mild laxative (Milk of Magnesia or Miralax) should be taken according to package instructions if there are no bowel movements after 48 hours. °7. Unless discharge instructions indicate otherwise, you may remove your bandages 24-48 hours after surgery, and you may shower at that time.  You may have steri-strips (small skin tapes) in place directly over the incision.  These strips should be left on the skin for 7-10 days.  If your surgeon used skin glue on the incision, you may shower in 24 hours.  The glue will flake off over the next 2-3 weeks.  Any sutures or  staples will be removed at the office during your follow-up visit. °8. ACTIVITIES:  You may resume regular (light) daily activities beginning the next day--such as daily self-care, walking, climbing stairs--gradually increasing activities as tolerated.  You may have sexual intercourse when it is comfortable.  Refrain from any heavy lifting or straining until approved by your doctor. °a. You may drive when you are no longer taking prescription pain medication, you can comfortably wear a seatbelt, and you can safely maneuver your car and apply brakes. °b. RETURN TO WORK:  __________________________________________________________ °9. You should see your doctor in the office for a follow-up appointment approximately 2-3 weeks after your surgery.  Make sure that you call for this appointment within a day or two after you arrive home to insure a convenient appointment time. °10. OTHER INSTRUCTIONS: __________________________________________________________________________________________________________________________ __________________________________________________________________________________________________________________________ °WHEN TO CALL YOUR DOCTOR: °1. Fever over 101.0 °2. Inability to urinate °3. Continued bleeding from incision. °4. Increased pain, redness, or drainage from the incision. °5. Increasing abdominal pain ° °The clinic staff is available to answer your questions during regular business hours.  Please don’t hesitate to call and ask to speak to one of the nurses for clinical concerns.  If you have a medical emergency, go to the nearest emergency room or call 911.  A surgeon from [facility] is always on call at the hospital. °[address], [address], [place], [place]  [address] ? [address] [address], [place], [place]   [address] °[phone] ? [phone] ? FAX [phone] °Web site:   [email or web address] °

## 2016-11-25 NOTE — Anesthesia Postprocedure Evaluation (Signed)
Anesthesia Post Note  Patient: Stacey Holland  Procedure(s) Performed: Procedure(s) (LRB): LAPAROSCOPIC CHOLECYSTECTOMY (N/A)  Patient location during evaluation: PACU Anesthesia Type: General Level of consciousness: awake and alert Pain management: pain level controlled Vital Signs Assessment: post-procedure vital signs reviewed and stable Respiratory status: spontaneous breathing, nonlabored ventilation, respiratory function stable and patient connected to nasal cannula oxygen Cardiovascular status: blood pressure returned to baseline and stable Postop Assessment: no signs of nausea or vomiting Anesthetic complications: no       Last Vitals:  Vitals:   11/25/16 1039 11/25/16 1045  BP:  (!) 137/98  Pulse: 98 98  Resp:  12  Temp: 36.1 C     Last Pain: There were no vitals filed for this visit.               Israa Caban S

## 2016-11-25 NOTE — Transfer of Care (Signed)
Immediate Anesthesia Transfer of Care Note  Patient: Stacey Holland  Procedure(s) Performed: Procedure(s): LAPAROSCOPIC CHOLECYSTECTOMY (N/A)  Patient Location: PACU  Anesthesia Type:General  Level of Consciousness: responds to stimulation  Airway & Oxygen Therapy: Patient Spontanous Breathing and Patient connected to face mask oxygen  Post-op Assessment: Report given to RN and Post -op Vital signs reviewed and stable  Post vital signs: Reviewed and stable  Last Vitals:  Vitals:   11/25/16 1039 11/25/16 1045  BP:  (!) 137/98  Pulse: 98 98  Resp:  12  Temp: 36.1 C     Last Pain: There were no vitals filed for this visit.       Complications: No apparent anesthesia complications

## 2016-11-25 NOTE — Anesthesia Preprocedure Evaluation (Signed)
Anesthesia Evaluation  Patient identified by MRN, date of birth, ID band Patient awake    Reviewed: Allergy & Precautions, NPO status , Patient's Chart, lab work & pertinent test results  Airway Mallampati: II  TM Distance: >3 FB Neck ROM: Full    Dental no notable dental hx.    Pulmonary neg pulmonary ROS,    Pulmonary exam normal breath sounds clear to auscultation       Cardiovascular hypertension, Normal cardiovascular exam Rhythm:Regular Rate:Normal     Neuro/Psych negative neurological ROS  negative psych ROS   GI/Hepatic negative GI ROS, Neg liver ROS,   Endo/Other  negative endocrine ROS  Renal/GU negative Renal ROS  negative genitourinary   Musculoskeletal negative musculoskeletal ROS (+)   Abdominal   Peds negative pediatric ROS (+)  Hematology negative hematology ROS (+)   Anesthesia Other Findings   Reproductive/Obstetrics negative OB ROS                             Anesthesia Physical Anesthesia Plan  ASA: II  Anesthesia Plan: General   Post-op Pain Management:    Induction: Intravenous  Airway Management Planned: Oral ETT  Additional Equipment:   Intra-op Plan:   Post-operative Plan: Extubation in OR  Informed Consent: I have reviewed the patients History and Physical, chart, labs and discussed the procedure including the risks, benefits and alternatives for the proposed anesthesia with the patient or authorized representative who has indicated his/her understanding and acceptance.   Dental advisory given  Plan Discussed with: CRNA and Surgeon  Anesthesia Plan Comments:         Anesthesia Quick Evaluation  

## 2016-11-25 NOTE — H&P (View-Only) (Signed)
History of Present Illness (Raman Featherston MD; 10/29/2016 10:01 AM) The patient is a 21 year old female who presents for evaluation of gall stones. The patient is a 21-year-old female who has been here previously secondary to left upper quadrant pain. Patient subsequently had repeat right upper quadrant pain that radiates to the back. Patient was seen ER at Hospital. Patient underwent EGD by Dr. Pirtle. This revealed normal intragastric findings. Patient did have ultrasound with known gallstones. She does state that at that time prior to her episode patient did have cream cheese. She states that she underwent right  Patient returns to discuss cholecystectomy.   Allergies (Sade Bradford, CMA; 10/29/2016 9:17 AM) PINEAPPLE  Swelling, Rash, Itching. Dyes   Medication History (Sade Bradford, CMA; 10/29/2016 9:18 AM) Hydrocod Polst-CPM Polst ER (10-8MG/5ML Suspension ER, Oral as needed) Active. Benzonatate (100MG Capsule, Oral daily) Active. Klor-Con M20 (20MEQ Tablet ER, Oral as needed) Active. Ondansetron (4MG Tablet Disint, Oral as needed) Active. Omeprazole (20MG Capsule DR, Oral) Active. Biotin (10000MCG Tablet Disint, Oral daily) Active. Tri-Previfem (0.18/0.215/0.25MG-35 MCG Tablet, Oral daily) Active. PriLOSEC (20MG Capsule DR, Oral daily) Active. Zofran (4MG Tablet, Oral as needed) Active. Medications Reconciled    Review of Systems (Avory Mimbs, MD; 10/29/2016 10:0 AM) General Present- Feeling well. Not Present- Fever. Respiratory Not Present- Cough and Difficulty Breathing. Cardiovascular Not Present- Chest Pain. Gastrointestinal Present- Abdominal Pain and Nausea. Musculoskeletal Not Present- Myalgia. Neurological Not Present- Weakness.  Vitals (Sade Bradford CMA; 10/29/2016 9:19 AM) 10/29/2016 9:18 AM Weight: 177.2 lb Height: 64in Body Surface Area: 1.86 m Body Mass Index: 30.42 kg/m  Temp.: 99.5F  Pulse: 98 (Regular)  BP: 120/82  (Sitting, Left Arm, Standard)       Physical Exam (Scout Guyett, MD; 10/29/2016 10:0 AM) General Mental Status-Alert. General Appearance-Consistent with stated age. Hydration-Well hydrated. Voice-Normal.  Head and Neck Head-normocephalic, atraumatic with no lesions or palpable masses.  Eye Eyeball - Bilateral-Extraocular movements intact. Sclera/Conjunctiva - Bilateral-No scleral icterus.  Chest and Lung Exam Chest and lung exam reveals -quiet, even and easy respiratory effort with no use of accessory muscles. Inspection Chest Wall - Normal. Back - normal.  Cardiovascular Cardiovascular examination reveals -normal heart sounds, regular rate and rhythm with no murmurs.  Abdomen Inspection Normal Exam - No Hernias. Palpation/Percussion Normal exam - Soft, Non Tender, No Rebound tenderness, No Rigidity (guarding) and No hepatosplenomegaly. Auscultation Normal exam - Bowel sounds normal.  Neurologic Neurologic evaluation reveals -alert and oriented x 3 with no impairment of recent or remote memory. Mental Status-Normal.  Musculoskeletal Normal Exam - Left-Upper Extremity Strength Normal and Lower Extremity Strength Normal. Normal Exam - Right-Upper Extremity Strength Normal, Lower Extremity Weakness.    Assessment & Plan (Khris Jansson MD; 10/29/2016 10:02 AM) SYMPTOMATIC CHOLELITHIASIS (K80.20) Impression: 21-year-old female with significant cholelithiasis  1. We will proceed to the operating room for a laparoscopic cholecystectomy  2. Risks and benefits were discussed with the patient to generally include, but not limited to: infection, bleeding, possible need for post op ERCP, damage to the bile ducts, bile leak, and possible need for further surgery. Alternatives were offered and described. All questions were answered and the patient voiced understanding of the procedure and wishes to proceed at this point with a laparoscopic  cholecystectomy 

## 2016-11-25 NOTE — Op Note (Signed)
11/25/2016  10:25 AM  PATIENT:  Stacey CocoNuha Z Holland  21 y.o. female  PRE-OPERATIVE DIAGNOSIS:  gallstones  POST-OPERATIVE DIAGNOSIS:  gallstones  PROCEDURE:  Procedure(s): LAPAROSCOPIC CHOLECYSTECTOMY (N/A)  SURGEON:  Surgeon(s) and Role:    * Axel FillerArmando Kairen Hallinan, MD - Primary  ANESTHESIA:   local and general  EBL: <5cc Total I/O In: 1000 [I.V.:1000] Out: -   BLOOD ADMINISTERED:none  DRAINS: none   LOCAL MEDICATIONS USED:  BUPIVICAINE   SPECIMEN:  Source of Specimen:  gallbladder  DISPOSITION OF SPECIMEN:  PATHOLOGY  COUNTS:  YES  TOURNIQUET:  * No tourniquets in log *  DICTATION: .Dragon Dictation  The patient was taken to the operating and placed in the supine position with bilateral SCDs in place. The patient was prepped and draped in the usual sterile fashion. A time out was called and all facts were verified. A pneumoperitoneum was obtained via A Veress needle technique to a pressure of 14mm of mercury in the left upper quadrant.  A 5mm trochar was then placed in the right upper quadrant under visualization, and there were no injuries to any abdominal organs. A 11 mm port was then placed in the umbilical region after infiltrating with local anesthesia under direct visualization. A second 5mm epigastric port was placed under direct visualization, respectively. The liver was very fatty and large. The gallbladder was identified and retracted, the peritoneum was then sharply dissected from the gallbladder and this dissection was carried down to Calot's triangle. The gallbladder was identified and stripped away circumferentially and seen going into the gallbladder 360, the critical angle was obtained.  2 clips were placed proximally one distally and the cystic duct transected. The cystic artery was identified and 2 clips placed proximally and one distally and transected. We then proceeded to remove the gallbladder off the hepatic fossa with Bovie cautery. A retrieval bag was then  placed in the abdomen and gallbladder placed in the bag. The hepatic fossa was then reexamined and hemostasis was achieved with Bovie cautery and was excellent at the end of the case. The subhepatic fossa and perihepatic fossa was then irrigated until the effluent was clear.  The gallbladder and bag were removed from the abdominal cavity. The 11 mm trocar fascia was reapproximated with the Endo Close #1 Vicryl x2. The pneumoperitoneum was evacuated and all trochars removed under direct visulalization. The skin was then closed with 4-0 Monocryl and the skin dressed with Steri-Strips, gauze, and tape. The patient was awaken from general anesthesia and taken to the recovery room in stable condition.   PLAN OF CARE: Discharge to home after PACU  PATIENT DISPOSITION:  PACU - hemodynamically stable.   Delay start of Pharmacological VTE agent (>24hrs) due to surgical blood loss or risk of bleeding: not applicable

## 2016-11-25 NOTE — Anesthesia Procedure Notes (Signed)
Procedure Name: Intubation Date/Time: 11/25/2016 9:40 AM Performed by: Manuela Schwartz B Pre-anesthesia Checklist: Patient identified, Emergency Drugs available, Suction available, Patient being monitored and Timeout performed Patient Re-evaluated:Patient Re-evaluated prior to inductionOxygen Delivery Method: Circle system utilized Preoxygenation: Pre-oxygenation with 100% oxygen Intubation Type: IV induction Ventilation: Mask ventilation without difficulty Laryngoscope Size: Mac and 3 Grade View: Grade I Tube type: Oral Tube size: 7.5 mm Number of attempts: 1 Airway Equipment and Method: Stylet Placement Confirmation: ETT inserted through vocal cords under direct vision,  positive ETCO2 and breath sounds checked- equal and bilateral Secured at: 21 cm Tube secured with: Tape Dental Injury: Teeth and Oropharynx as per pre-operative assessment

## 2016-11-25 NOTE — Interval H&P Note (Signed)
History and Physical Interval Note:  11/25/2016 8:06 AM  Stacey Holland  has presented today for surgery, with the diagnosis of gallstones  The various methods of treatment have been discussed with the patient and family. After consideration of risks, benefits and other options for treatment, the patient has consented to  Procedure(s): LAPAROSCOPIC CHOLECYSTECTOMY (N/A) as a surgical intervention .  The patient's history has been reviewed, patient examined, no change in status, stable for surgery.  I have reviewed the patient's chart and labs.  Questions were answered to the patient's satisfaction.     Marigene Ehlersamirez Jr., Jed LimerickArmando

## 2016-11-26 ENCOUNTER — Encounter (HOSPITAL_COMMUNITY): Payer: Self-pay | Admitting: General Surgery

## 2017-01-05 ENCOUNTER — Encounter: Payer: Self-pay | Admitting: Internal Medicine

## 2017-01-05 ENCOUNTER — Other Ambulatory Visit (INDEPENDENT_AMBULATORY_CARE_PROVIDER_SITE_OTHER): Payer: BLUE CROSS/BLUE SHIELD

## 2017-01-05 ENCOUNTER — Ambulatory Visit (INDEPENDENT_AMBULATORY_CARE_PROVIDER_SITE_OTHER): Payer: BLUE CROSS/BLUE SHIELD | Admitting: Internal Medicine

## 2017-01-05 VITALS — BP 110/74 | HR 90 | Temp 98.3°F | Resp 16 | Ht 64.0 in | Wt 152.0 lb

## 2017-01-05 DIAGNOSIS — D72820 Lymphocytosis (symptomatic): Secondary | ICD-10-CM

## 2017-01-05 DIAGNOSIS — I10 Essential (primary) hypertension: Secondary | ICD-10-CM

## 2017-01-05 DIAGNOSIS — D539 Nutritional anemia, unspecified: Secondary | ICD-10-CM | POA: Diagnosis not present

## 2017-01-05 DIAGNOSIS — E876 Hypokalemia: Secondary | ICD-10-CM | POA: Diagnosis not present

## 2017-01-05 LAB — CBC WITH DIFFERENTIAL/PLATELET
BASOS PCT: 0.7 % (ref 0.0–3.0)
Basophils Absolute: 0.1 10*3/uL (ref 0.0–0.1)
EOS ABS: 0.1 10*3/uL (ref 0.0–0.7)
EOS PCT: 1.3 % (ref 0.0–5.0)
HEMATOCRIT: 36.5 % (ref 36.0–46.0)
HEMOGLOBIN: 12.6 g/dL (ref 12.0–15.0)
LYMPHS PCT: 47.8 % — AB (ref 12.0–46.0)
Lymphs Abs: 3.9 10*3/uL (ref 0.7–4.0)
MCHC: 34.5 g/dL (ref 30.0–36.0)
MCV: 78.3 fl (ref 78.0–100.0)
Monocytes Absolute: 0.6 10*3/uL (ref 0.1–1.0)
Monocytes Relative: 7.4 % (ref 3.0–12.0)
Neutro Abs: 3.5 10*3/uL (ref 1.4–7.7)
Neutrophils Relative %: 42.8 % — ABNORMAL LOW (ref 43.0–77.0)
Platelets: 296 10*3/uL (ref 150.0–400.0)
RBC: 4.66 Mil/uL (ref 3.87–5.11)
RDW: 12.4 % (ref 11.5–15.5)
WBC: 8.2 10*3/uL (ref 4.0–10.5)

## 2017-01-05 LAB — IBC PANEL
Iron: 62 ug/dL (ref 42–145)
Saturation Ratios: 13.8 % — ABNORMAL LOW (ref 20.0–50.0)
TRANSFERRIN: 322 mg/dL (ref 212.0–360.0)

## 2017-01-05 LAB — FERRITIN: FERRITIN: 360.8 ng/mL — AB (ref 10.0–291.0)

## 2017-01-05 NOTE — Progress Notes (Signed)
Subjective:  Patient ID: Stacey Holland, female    DOB: [birth year]  Age: 21 y.o. MRN: [id number]  CC: Anemia and Sore Throat   HPI Stacey Holland presents for Follow-up on anemia, hypertension, and atypical lymphocytes. She complains of a three-day history of a scratchy throat, runny nose, and mild nonproductive cough. She denies fever, chills, lymphadenopathy, or shortness of breath. She has had her gallbladder removed since I last saw her.  Outpatient Medications Prior to Visit  Medication Sig Dispense Refill  . Norgestimate-Ethinyl Estradiol Triphasic (TRI-PREVIFEM) 0.18/0.215/0.25 MG-35 MCG tablet Take 1 tablet by mouth at bedtime.    [name] omeprazole (PRILOSEC) 20 MG capsule Take 20 mg by mouth at bedtime.    . Biotin 40981 MCG TABS Take 10,000 Units by mouth daily.    . Multiple Vitamin (MULTIVITAMIN WITH MINERALS) TABS tablet Take 1 tablet by mouth daily.    [name] oxyCODONE-acetaminophen (ROXICET) 5-325 MG tablet Take 1-2 tablets by mouth every 4 (four) hours as needed. (Patient not taking: Reported on 01/05/2017) 30 tablet 0  . potassium chloride (KLOR-CON) 8 MEQ tablet Take 16 mEq by mouth daily.    . promethazine (PHENERGAN) 12.5 MG tablet TAKE 1 TABLET (12.5 MG TOTAL) BY MOUTH EVERY 8 (EIGHT) HOURS AS NEEDED FOR NAUSEA OR VOMITING.  0  . traMADol (ULTRAM) 50 MG tablet Take 2 tablets (100 mg total) by mouth every 6 (six) hours as needed for severe pain. (Patient not taking: Reported on 01/05/2017) 16 tablet 0   No facility-administered medications prior to visit.     ROS Review of Systems  Constitutional: Negative for chills, diaphoresis, fatigue, fever and unexpected weight change.  HENT: Positive for sore throat. Negative for congestion and trouble swallowing.   Eyes: Negative.   Respiratory: Positive for cough. Negative for chest tightness, shortness of breath and wheezing.   Cardiovascular: Negative for chest pain, palpitations and leg swelling.  Gastrointestinal: Negative for  abdominal pain, blood in stool, constipation, diarrhea, nausea and vomiting.  Endocrine: Negative.   Genitourinary: Negative.  Negative for difficulty urinating and dysuria.  Musculoskeletal: Negative.  Negative for back pain and myalgias.  Skin: Negative.  Negative for color change and rash.  Allergic/Immunologic: Negative.   Neurological: Negative.  Negative for dizziness, weakness and headaches.  Hematological: Negative for adenopathy. Does not bruise/bleed easily.  Psychiatric/Behavioral: Negative.     Objective:  BP 110/74 (BP Location: Left Arm, Patient Position: Sitting, Cuff Size: Normal)   Pulse 90   Temp 98.3 F (36.8 C) (Oral)   Resp 16   Ht 5\' 4"  (1.626 m)   Wt 152 lb (68.9 kg)   LMP 12/28/2016   SpO2 96%   BMI 26.09 kg/m   BP Readings from Last 3 Encounters:  01/06/17 130/90  01/05/17 110/74  11/25/16 (!) 145/106    Wt Readings from Last 3 Encounters:  01/06/17 151 lb 4.8 oz (68.6 kg)  01/05/17 152 lb (68.9 kg)  11/19/16 167 lb (75.8 kg)    Physical Exam  Constitutional: She is oriented to person, place, and time.  Non-toxic appearance. She does not have a sickly appearance. She does not appear ill. No distress.  HENT:  Mouth/Throat: Oropharynx is clear and moist and mucous membranes are normal. Mucous membranes are not pale, not dry and not cyanotic. No oral lesions. No trismus in the jaw. No uvula swelling. No oropharyngeal exudate, posterior oropharyngeal edema, posterior oropharyngeal erythema or tonsillar abscesses.  Eyes: Conjunctivae are normal. Right eye exhibits no discharge. Left  eye exhibits no discharge. No scleral icterus.  Neck: Normal range of motion. Neck supple. No JVD present. No tracheal deviation present. No thyromegaly present.  Cardiovascular: Normal rate, regular rhythm, normal heart sounds and intact distal pulses.  Exam reveals no gallop and no friction rub.   No murmur heard. Pulmonary/Chest: Effort normal and breath sounds normal. No  stridor. No respiratory distress. She has no wheezes. She has no rales. She exhibits no tenderness.  Abdominal: Soft. Bowel sounds are normal. She exhibits no distension and no mass. There is no tenderness. There is no rebound and no guarding.  Musculoskeletal: Normal range of motion. She exhibits no edema, tenderness or deformity.  Lymphadenopathy:    She has no cervical adenopathy.  Neurological: She is oriented to person, place, and time.  Skin: Skin is warm and dry. No rash noted. She is not diaphoretic. No erythema. No pallor.  Vitals reviewed.   Lab Results  Component Value Date   WBC 6.1 01/06/2017   HGB 12.2 01/06/2017   HCT 36.3 01/06/2017   PLT 274 01/06/2017   GLUCOSE 73 01/06/2017   CHOL 196 07/21/2016   TRIG 209 (H) 07/21/2016   HDL 39 (L) 07/21/2016   LDLCALC 115 (H) 07/21/2016   ALT 27 01/06/2017   AST 26 01/06/2017   NA 140 01/06/2017   K 2.9 (LL) 01/06/2017   CL 108 11/19/2016   CREATININE 1.1 01/06/2017   BUN 20.2 01/06/2017   CO2 34 (H) 01/06/2017   TSH 0.44 10/22/2016   HGBA1C 5.2 07/27/2016    No results found.  Assessment & Plan:   Stacey Holland was seen today for anemia and sore throat.  Diagnoses and all orders for this visit:  Essential hypertension- her blood pressure is adequately well-controlled with no medications. Her potassium level is down to 2.9 so I have asked her to start potassium replacement therapy. At this time of asked her to be certain that she is not taking her previous diuretic.  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 Her lymphocyte percentage continues to be stable and her anemia has improved. She has been seen by hematology and no evidence of malignancy was found. Will continue to monitor this. -     CBC with Differential/Platelet; Future  Deficiency anemia- her H&H are normal now. -     CBC with Differential/Platelet; Future -     IBC panel;  Future -     Ferritin; Future   I have discontinued Stacey Holland's traMADol, multivitamin with minerals, promethazine, and oxyCODONE-acetaminophen. I am also having her maintain her omeprazole and Norgestimate-Ethinyl Estradiol Triphasic.  No orders of the defined types were placed in this encounter.    Follow-up: Return in about 3 months (around 04/04/2017).  Sanda Lingerhomas Tameca Jerez, MD

## 2017-01-05 NOTE — Progress Notes (Signed)
Pre visit review using our clinic review tool, if applicable. No additional management support is needed unless otherwise documented below in the visit note. 

## 2017-01-05 NOTE — Patient Instructions (Signed)
Anemia, Nonspecific Anemia is a condition in which the concentration of red blood cells or hemoglobin in the blood is below normal. Hemoglobin is a substance in red blood cells that carries oxygen to the tissues of the body. Anemia results in not enough oxygen reaching these tissues. What are the causes? Common causes of anemia include:  Excessive bleeding. Bleeding may be internal or external. This includes excessive bleeding from periods (in women) or from the intestine.  Poor nutrition.  Chronic kidney, thyroid, and liver disease.  Bone marrow disorders that decrease red blood cell production.  Cancer and treatments for cancer.  HIV, AIDS, and their treatments.  Spleen problems that increase red blood cell destruction.  Blood disorders.  Excess destruction of red blood cells due to infection, medicines, and autoimmune disorders. What are the signs or symptoms?  Minor weakness.  Dizziness.  Headache.  Palpitations.  Shortness of breath, especially with exercise.  Paleness.  Cold sensitivity.  Indigestion.  Nausea.  Difficulty sleeping.  Difficulty concentrating. Symptoms may occur suddenly or they may develop slowly. How is this diagnosed? Additional blood tests are often needed. These help your health care provider determine the best treatment. Your health care provider will check your stool for blood and look for other causes of blood loss. How is this treated? Treatment varies depending on the cause of the anemia. Treatment can include:  Supplements of iron, vitamin B12, or folic acid.  Hormone medicines.  A blood transfusion. This may be needed if blood loss is severe.  Hospitalization. This may be needed if there is significant continual blood loss.  Dietary changes.  Spleen removal. Follow these instructions at home: Keep all follow-up appointments. It often takes many weeks to correct anemia, and having your health care provider check on your  condition and your response to treatment is very important. Get help right away if:  You develop extreme weakness, shortness of breath, or chest pain.  You become dizzy or have trouble concentrating.  You develop heavy vaginal bleeding.  You develop a rash.  You have bloody or black, tarry stools.  You faint.  You vomit up blood.  You vomit repeatedly.  You have abdominal pain.  You have a fever or persistent symptoms for more than 2-3 days.  You have a fever and your symptoms suddenly get worse.  You are dehydrated. This information is not intended to replace advice given to you by your health care provider. Make sure you discuss any questions you have with your health care provider. Document Released: 12/03/2004 Document Revised: 04/08/2016 Document Reviewed: 04/21/2013 Elsevier Interactive Patient Education  2017 Elsevier Inc.  

## 2017-01-06 ENCOUNTER — Other Ambulatory Visit: Payer: Self-pay | Admitting: *Deleted

## 2017-01-06 ENCOUNTER — Encounter: Payer: Self-pay | Admitting: Hematology

## 2017-01-06 ENCOUNTER — Other Ambulatory Visit (HOSPITAL_BASED_OUTPATIENT_CLINIC_OR_DEPARTMENT_OTHER): Payer: BLUE CROSS/BLUE SHIELD

## 2017-01-06 ENCOUNTER — Encounter: Payer: Self-pay | Admitting: Internal Medicine

## 2017-01-06 ENCOUNTER — Ambulatory Visit (HOSPITAL_BASED_OUTPATIENT_CLINIC_OR_DEPARTMENT_OTHER): Payer: BLUE CROSS/BLUE SHIELD | Admitting: Hematology

## 2017-01-06 VITALS — BP 130/90 | HR 88 | Temp 98.3°F | Resp 18 | Wt 151.3 lb

## 2017-01-06 DIAGNOSIS — D72820 Lymphocytosis (symptomatic): Secondary | ICD-10-CM | POA: Diagnosis not present

## 2017-01-06 DIAGNOSIS — E876 Hypokalemia: Secondary | ICD-10-CM

## 2017-01-06 DIAGNOSIS — D649 Anemia, unspecified: Secondary | ICD-10-CM | POA: Diagnosis not present

## 2017-01-06 DIAGNOSIS — R700 Elevated erythrocyte sedimentation rate: Secondary | ICD-10-CM

## 2017-01-06 LAB — COMPREHENSIVE METABOLIC PANEL
ALT: 27 U/L (ref 0–55)
ANION GAP: 15 meq/L — AB (ref 3–11)
AST: 26 U/L (ref 5–34)
Albumin: 3.8 g/dL (ref 3.5–5.0)
Alkaline Phosphatase: 79 U/L (ref 40–150)
BUN: 20.2 mg/dL (ref 7.0–26.0)
CALCIUM: 10.2 mg/dL (ref 8.4–10.4)
CHLORIDE: 92 meq/L — AB (ref 98–109)
CO2: 34 mEq/L — ABNORMAL HIGH (ref 22–29)
Creatinine: 1.1 mg/dL (ref 0.6–1.1)
EGFR: 72 mL/min/{1.73_m2} — ABNORMAL LOW (ref >90)
Glucose: 73 mg/dl (ref 70–140)
POTASSIUM: 2.9 meq/L — AB (ref 3.5–5.1)
Sodium: 140 mEq/L (ref 136–145)
Total Bilirubin: 0.57 mg/dL (ref 0.20–1.20)
Total Protein: 8.5 g/dL — ABNORMAL HIGH (ref 6.4–8.3)

## 2017-01-06 LAB — CBC & DIFF AND RETIC
BASO%: 0.7 % (ref 0.0–2.0)
BASOS ABS: 0 10*3/uL (ref 0.0–0.1)
EOS%: 1.1 % (ref 0.0–7.0)
Eosinophils Absolute: 0.1 10*3/uL (ref 0.0–0.5)
HEMATOCRIT: 36.3 % (ref 34.8–46.6)
HGB: 12.2 g/dL (ref 11.6–15.9)
Immature Retic Fract: 1.4 % — ABNORMAL LOW (ref 1.60–10.00)
LYMPH%: 51.4 % — ABNORMAL HIGH (ref 14.0–49.7)
MCH: 26.8 pg (ref 25.1–34.0)
MCHC: 33.6 g/dL (ref 31.5–36.0)
MCV: 79.6 fL (ref 79.5–101.0)
MONO#: 0.5 10*3/uL (ref 0.1–0.9)
MONO%: 8 % (ref 0.0–14.0)
NEUT#: 2.4 10*3/uL (ref 1.5–6.5)
NEUT%: 38.8 % (ref 38.4–76.8)
PLATELETS: 274 10*3/uL (ref 145–400)
RBC: 4.56 10*6/uL (ref 3.70–5.45)
RDW: 12.3 % (ref 11.2–14.5)
Retic %: 1.47 % (ref 0.70–2.10)
Retic Ct Abs: 67.03 10*3/uL (ref 33.70–90.70)
WBC: 6.1 10*3/uL (ref 3.9–10.3)
lymph#: 3.1 10*3/uL (ref 0.9–3.3)

## 2017-01-06 MED ORDER — POTASSIUM CHLORIDE CRYS ER 20 MEQ PO TBCR: EXTENDED_RELEASE_TABLET | ORAL | 0 refills | Status: DC

## 2017-01-06 MED ORDER — POTASSIUM CHLORIDE CRYS ER 20 MEQ PO TBCR: 20.0000 meq | EXTENDED_RELEASE_TABLET | Freq: Two times a day (BID) | ORAL | 3 refills | Status: DC

## 2017-01-07 LAB — SEDIMENTATION RATE: Sedimentation Rate-Westergren: 58 mm/hr — ABNORMAL HIGH (ref 0–32)

## 2017-01-07 LAB — C-REACTIVE PROTEIN: CRP: 8.6 mg/L — ABNORMAL HIGH (ref 0.0–4.9)

## 2017-02-23 ENCOUNTER — Encounter: Payer: Self-pay | Admitting: Internal Medicine

## 2017-02-23 ENCOUNTER — Ambulatory Visit (INDEPENDENT_AMBULATORY_CARE_PROVIDER_SITE_OTHER): Payer: BLUE CROSS/BLUE SHIELD | Admitting: Internal Medicine

## 2017-02-23 ENCOUNTER — Other Ambulatory Visit (INDEPENDENT_AMBULATORY_CARE_PROVIDER_SITE_OTHER): Payer: BLUE CROSS/BLUE SHIELD

## 2017-02-23 VITALS — BP 110/80 | HR 95 | Temp 98.6°F | Resp 16 | Ht 64.0 in | Wt 159.8 lb

## 2017-02-23 DIAGNOSIS — E876 Hypokalemia: Secondary | ICD-10-CM

## 2017-02-23 DIAGNOSIS — I10 Essential (primary) hypertension: Secondary | ICD-10-CM

## 2017-02-23 LAB — BASIC METABOLIC PANEL
BUN: 10 mg/dL (ref 6–23)
CO2: 30 meq/L (ref 19–32)
Calcium: 9.5 mg/dL (ref 8.4–10.5)
Chloride: 102 mEq/L (ref 96–112)
Creatinine, Ser: 0.86 mg/dL (ref 0.40–1.20)
GFR: 88.34 mL/min (ref >60.00)
Glucose, Bld: 72 mg/dL (ref 70–99)
POTASSIUM: 3.7 meq/L (ref 3.5–5.1)
SODIUM: 140 meq/L (ref 135–145)

## 2017-02-23 LAB — MAGNESIUM: Magnesium: 1.6 mg/dL (ref 1.5–2.5)

## 2017-02-23 NOTE — Progress Notes (Signed)
Subjective:  Patient ID: Stacey Holland, female    DOB: 05/12/1996  Age: 21 y.o. MRN: [id number]  CC: Hypertension   HPI Stacey Holland presents for a blood pressure check. She feels well today and offers no complaints. She is not taking an antihypertensive. She stopped taking potassium supplements several weeks ago. She has gained some weight since I last saw her.  Outpatient Medications Prior to Visit  Medication Sig Dispense Refill  . Norgestimate-Ethinyl Estradiol Triphasic (TRI-PREVIFEM) 0.18/0.215/0.25 MG-35 MCG tablet Take 1 tablet by mouth at bedtime.    [name] omeprazole (PRILOSEC) 20 MG capsule Take 20 mg by mouth at bedtime.    . potassium chloride SA (K-DUR,KLOR-CON) 20 MEQ tablet Take twice a day for 2 days.  Then take daily.  Recheck potassium with PCP in 7-10 days. 32 tablet 0  . potassium chloride SA (K-DUR,KLOR-CON) 20 MEQ tablet Take 1 tablet (20 mEq total) by mouth 2 (two) times daily. 60 tablet 3   No facility-administered medications prior to visit.     ROS Review of Systems  Constitutional: Negative for chills, diaphoresis, fatigue and unexpected weight change.  HENT: Negative.  Negative for trouble swallowing.   Eyes: Negative for photophobia and visual disturbance.  Respiratory: Negative for cough, chest tightness, shortness of breath and wheezing.   Cardiovascular: Negative.  Negative for chest pain, palpitations and leg swelling.  Gastrointestinal: Negative for abdominal pain, constipation, diarrhea, nausea and vomiting.  Endocrine: Negative.  Negative for polyuria.  Genitourinary: Negative.  Negative for difficulty urinating and urgency.  Musculoskeletal: Negative.   Skin: Negative.   Neurological: Negative.  Negative for dizziness, weakness and headaches.  Hematological: Negative for adenopathy. Does not bruise/bleed easily.  Psychiatric/Behavioral: Negative.  Negative for dysphoric mood and sleep disturbance. The patient is not nervous/anxious.      Objective:  BP 110/80 (BP Location: Left Arm, Patient Position: Sitting, Cuff Size: Normal)   Pulse 95   Temp 98.6 F (37 C) (Oral)   Resp 16   Ht  (1.626 m)   Wt 159 lb 12 oz (72.5 kg)   LMP 02/20/2017   SpO2 99%   BMI 27.42 kg/m   BP Readings from Last 3 Encounters:  02/23/17 110/80  01/06/17 130/90  01/05/17 110/74    Wt Readings from Last 3 Encounters:  02/23/17 159 lb 12 oz (72.5 kg)  01/06/17 151 lb 4.8 oz (68.6 kg)  01/05/17 152 lb (68.9 kg)    Physical Exam  Constitutional: She is oriented to person, place, and time. No distress.  HENT:  Mouth/Throat: Oropharynx is clear and moist. No oropharyngeal exudate.  Eyes: Conjunctivae are normal. Right eye exhibits no discharge. Left eye exhibits no discharge. No scleral icterus.  Neck: Normal range of motion. Neck supple. No JVD present. No tracheal deviation present. No thyromegaly present.  Cardiovascular: Normal rate, regular rhythm, normal heart sounds and intact distal pulses.  Exam reveals no gallop and no friction rub.   No murmur heard. Pulmonary/Chest: Effort normal and breath sounds normal. No stridor. No respiratory distress. She has no wheezes. She has no rales. She exhibits no tenderness.  Abdominal: Soft. Bowel sounds are normal. She exhibits no distension and no mass. There is no tenderness. There is no rebound and no guarding.  Musculoskeletal: Normal range of motion. She exhibits no edema, tenderness or deformity.  Lymphadenopathy:    She has no cervical adenopathy.  Neurological: She is oriented to person, place, and time.  Skin: Skin is  warm and dry. No rash noted. She is not diaphoretic. No erythema. No pallor.  Psychiatric: She has a normal mood and affect. Her behavior is normal. Judgment and thought content normal.  Vitals reviewed.   Lab Results  Component Value Date   WBC 6.1 01/06/2017   HGB 12.2 01/06/2017   HCT 36.3 01/06/2017   PLT 274 01/06/2017   GLUCOSE 72 02/23/2017    CHOL 196 07/21/2016   TRIG 209 (H) 07/21/2016   HDL 39 (L) 07/21/2016   LDLCALC 115 (H) 07/21/2016   ALT 27 01/06/2017   AST 26 01/06/2017   NA 140 02/23/2017   K 3.7 02/23/2017   CL 102 02/23/2017   CREATININE 0.86 02/23/2017   BUN 10 02/23/2017   CO2 30 02/23/2017   TSH 0.44 10/22/2016   HGBA1C 5.2 07/27/2016    No results found.  Assessment & Plan:   Stacey Holland was seen today for hypertension.  Diagnoses and all orders for this visit:  Hypokalemia- her potassium level is in the low normal range, I've asked her to continue potassium replacement therapy -     Basic metabolic panel; Future -     Magnesium; Future  Hypomagnesemia- her magnesium level is in the normal range, no treatment is indicated -     Basic metabolic panel; Future -     Magnesium; Future  Essential hypertension- her blood pressure is well-controlled, electrolytes are normal -     Basic metabolic panel; Future -     Magnesium; Future   I have discontinued Stacey Holland's diphenhydrAMINE. I am also having her maintain her omeprazole, Norgestimate-Ethinyl Estradiol Triphasic, and potassium chloride SA.  Meds ordered this encounter  Medications  . DISCONTD: diphenhydrAMINE (BENADRYL) 25 MG tablet    Sig: Take 25 mg by mouth every 6 (six) hours as needed.     Follow-up: Return in about 4 months (around 06/25/2017).  Sanda Linger, MD

## 2017-02-23 NOTE — Progress Notes (Signed)
Pre visit review using our clinic review tool, if applicable. No additional management support is needed unless otherwise documented below in the visit note. 

## 2017-02-23 NOTE — Patient Instructions (Signed)

## 2017-02-24 MED ORDER — POTASSIUM CHLORIDE CRYS ER 20 MEQ PO TBCR: 20.0000 meq | EXTENDED_RELEASE_TABLET | Freq: Two times a day (BID) | ORAL | 3 refills | Status: DC

## 2017-03-12 NOTE — Progress Notes (Signed)
[name]    HEMATOLOGY/ONCOLOGY CLINIC NOTE  Date of Service: .01/06/2017  Patient Care Team: Etta Grandchild, MD as PCP - General (Internal Medicine)  CHIEF COMPLAINTS/PURPOSE OF CONSULTATION:  Anemia  HISTORY OF PRESENTING ILLNESS:   Stacey Holland is a wonderful 21 y.o. female who has been referred to [place] by Dr .Sanda Linger, MD  for evaluation and management of Anemia.  Patient is of [place] descent and has a history of morbid obesity, polycystic ovarian syndrome (on metformin), hypertension, acid reflux  who was referred to [place] by Dr. Sanda Linger for evaluation of anemia.  Patient notes that she weighed about 320 lbs at her heaviest. She has been on metformin and has been on a diet with smaller portions since March 2017 and has lost more than 120 pounds and notes that she is continuing to lose weight. About a month or so ago she was admitted with dehydration and renal dysfunction which improved with IV fluids. She notes that she has lost most of her desire to eat and gets nauseous with several different types of foods and can only tolerate some of them.  She has had persistent right upper quadrant abdominal pain And severe fatigue. She had a CT of the abdomen done on 09/01/2016 that showed no acute findings. Within the abdomen or pelvis and no evidence of splenomegaly or lymphadenopathy. She has a referral to gastroenterology and she'll be following up to get an EGD.  She complains of severe fatigue and issues with orthostatic hypotension. She has had issues with electrolyte abnormalities with concerns by her primary care physician for an eating disorder.  Patient reports significant amount of fatigue that is affecting her college studies . She reports that she has had what appears to be ML or rash the last few months.   she reports that she had a TB test in 2011 which was negative .  She last was interested [place] about 4 months ago .no fevers or chills no other obvious infections  .  Had a blood transfusion as an infant --primi/breech .   INTERVAL HISTORY  Stacey Holland is here for her scheduled follow-up for anemia. Her anemia has now resolved. She notes that her weight has remained stable and she isn't losing weight anymore. She knows that she has become more active with less fatigue. She has significant elevated and symmetric markers and has been recommended a rheumatology evaluation through her primary care physician. This could also be from her PCOS. She is still having active potassium loss in her urine. I have previously recommended close follow-up with her primary care physician and consideration of a nephrology evaluation due to urinary potassium wasting. She was given a prescription for potassium pills and was recommended to continue follow-up with her primary care physician with repeat labs and a couple weeks. No nausea or vomiting.   MEDICAL HISTORY:  Past Medical History:  Diagnosis Date  . Dyspnea    breathing heavyeven when sitting, lying dpwn,exertion  . GERD (gastroesophageal reflux disease)   . Headache   . History of blood transfusion    prematurity, this was during perinatal period  . Hypertension   . Morbid obesity (HCC) 04/03/2016  . PCOS (polycystic ovarian syndrome) 04/03/2016  . Reflux     SURGICAL HISTORY: Past Surgical History:  Procedure Laterality Date  . CHOLECYSTECTOMY N/A 11/25/2016   Procedure: LAPAROSCOPIC CHOLECYSTECTOMY;  Surgeon: Axel Filler, MD;  Location: [facility] OR;  Service: General;  Laterality: N/A;  . ESOPHAGOGASTRODUODENOSCOPY N/A 09/26/2016  Procedure: ESOPHAGOGASTRODUODENOSCOPY (EGD);  Surgeon: Beverley Fiedler, MD;  Location: [facility];  Service: Endoscopy;  Laterality: N/A;  . eustachion tubes      SOCIAL HISTORY: Social History   Social History  . Marital status: Single    Spouse name: N/A  . Number of children: N/A  . Years of education: N/A   Occupational History  . Not on file.   Social History  Main Topics  . Smoking status: Never Smoker  . Smokeless tobacco: Never Used  . Alcohol use No  . Drug use: No  . Sexual activity: No   Other Topics Concern  . Not on file   Social History Narrative   Studying mass communications at [employer]  No alcohol use No drug use  FAMILY HISTORY: Family History  Problem Relation Age of Onset  . GER disease Mother   . Hypertension Mother   . GER disease Father   . Hypertension Father   . Lactose intolerance Brother   . Hypertension Maternal Grandfather   . Cancer Neg Hx   . Depression Neg Hx   . Drug abuse Neg Hx   . Early death Neg Hx   . Heart disease Neg Hx   . Hyperlipidemia Neg Hx   . Kidney disease Neg Hx   . Alcohol abuse Neg Hx   . Asthma Neg Hx   . Stroke Neg Hx   Fraternal twin has possible autoimmune condition Mother -rheumatoid arthritis    ALLERGIES:  is allergic to pineapple and other.  MEDICATIONS:  Current Outpatient Prescriptions  Medication Sig Dispense Refill  . Norgestimate-Ethinyl Estradiol Triphasic (TRI-PREVIFEM) 0.18/0.215/0.25 MG-35 MCG tablet Take 1 tablet by mouth at bedtime.    [name] omeprazole (PRILOSEC) 20 MG capsule Take 20 mg by mouth at bedtime.    . potassium chloride SA (K-DUR,KLOR-CON) 20 MEQ tablet Take 1 tablet (20 mEq total) by mouth 2 (two) times daily. 60 tablet 3   No current facility-administered medications for this visit.     REVIEW OF SYSTEMS:    10 Point review of Systems was done is negative except as noted above.  PHYSICAL EXAMINATION: ECOG PERFORMANCE STATUS: 1  VS stable as noted above.  GENERAL:alert, in no acute distress and comfortable SKIN: skin color, texture, turgor are normal, no rashes or significant lesions EYES: normal, conjunctiva are pink and non-injected, sclera clear OROPHARYNX:no exudate, no erythema and lips, buccal mucosa, and tongue normal  NECK: supple, no JVD, thyroid normal size, non-tender, without nodularity LYMPH:  no palpable  lymphadenopathy in the cervical, axillary or inguinal LUNGS: clear to auscultation with normal respiratory effort HEART: regular rate & rhythm,  no murmurs and no lower extremity edema ABDOMEN: abdomen soft, non-tender, normoactive bowel sounds  Musculoskeletal: no cyanosis of digits and no clubbing  PSYCH: alert & oriented x 3 with fluent speech NEURO: no focal motor/sensory deficits.  LABORATORY DATA:  I have reviewed the data as listed  . CBC Latest Ref Rng & Units 01/06/2017 01/05/2017 11/19/2016  WBC 3.9 - 10.3 10e3/uL 6.1 8.2 5.2  Hemoglobin 11.6 - 15.9 g/dL 16.1 09.6 11.1(L)  Hematocrit 34.8 - 46.6 % 36.3 36.5 34.5(L)  Platelets 145 - 400 10e3/uL 274 296.0 250   . CMP Latest Ref Rng & Units 01/06/2017 11/19/2016  Glucose 70 - 99 mg/dL 73 79  BUN 6 - 23 mg/dL 04.5 7  Creatinine 4.09 - 1.20 mg/dL 1.1 8.11  Sodium 914 - 145 mEq/L 140 141  Potassium 3.5 - 5.1  mEq/L 2.9(LL) 3.6  Chloride 96 - 112 mEq/L - 108  CO2 19 - 32 mEq/L 34(H) 25  Calcium 8.4 - 10.5 mg/dL 45.410.2 9.3  Total Protein 6.4 - 8.3 g/dL 0.9(W8.5(H) -  Total Bilirubin 0.20 - 1.20 mg/dL 1.190.57 -  Alkaline Phos 40 - 150 U/L 79 -  AST 5 - 34 U/L 26 -  ALT 0 - 55 U/L 27 -   Magnesium 1.3     RADIOGRAPHIC STUDIES: I have personally reviewed the radiological images as listed and agreed with the findings in the report. No results found. CT ABDOMEN PELVIS W CONTRAST (Accession [id number]) (Order [id number])  Imaging  Date: 09/01/2016 [facility] [address] Released By: Vira Agarishara R Thomas, NT Authorizing: Etta Grandchildhomas L Jones, MD  Exam Information   Status Exam Begun  Exam Ended   Final [99] 09/01/2016 2:30 PM 09/01/2016 2:39 PM  PACS Images   Show images for CT ABDOMEN PELVIS W CONTRAST  Study Result   CLINICAL DATA:  Atypical lymphocytosis. Left upper quadrant pain for several months.  EXAM: CT ABDOMEN AND PELVIS WITH CONTRAST  TECHNIQUE: Multidetector CT imaging of the abdomen and  pelvis was performed using the standard protocol following bolus administration of intravenous contrast.  CONTRAST:  100mL ISOVUE-300 IOPAMIDOL (ISOVUE-300) INJECTION 61%  COMPARISON:  None.  FINDINGS: Lower Chest: No acute findings.  Hepatobiliary: No mass identified. Focal fatty infiltration adjacent to falciform ligament. Gallbladder is unremarkable.  Pancreas:  No mass or inflammatory changes.  Spleen: Within normal limits in size and appearance.  Adrenals/Urinary Tract: No masses identified. No evidence of hydronephrosis.  Stomach/Bowel: No evidence of obstruction, inflammatory process or abnormal fluid collections. Normal appendix visualized.  Vascular/Lymphatic: No pathologically enlarged lymph nodes. No abdominal aortic aneurysm.  Reproductive:  No mass identified.  Other:  None.  Musculoskeletal:  No suspicious bone lesions identified.  IMPRESSION: No acute findings within the abdomen or pelvis. No evidence of splenomegaly or lymphadenopathy.   Electronically Signed   By: Myles RosenthalJohn  Stahl M.D.   On: 09/01/2016 17:05     ASSESSMENT & PLAN:   21 year old Female originally from [place] with   1) Microcytic Anemia -Mild improving.  ferritin levels are elevated to 500's. He doesn't appear to be related to iron deficiency. Appears to be anemia of chronic disease with significantly elevated inflammatory markers due to unclear etiology. B12 levels within normal limits No evidence of hemolysis with normal LDH and bilirubin Could be due to rapid weight loss from 320 pounds down to 180lbs. this can result from several things including severe protein calorie malnutrition which of severe can cause gelatinous transformation of the bone marrow. Patient does not have any other significant cytopenias at this time. Copper and zinc levels within normal limits  CT chest abdomen pelvis - no evidence of hepatosplenomegaly or lymphadenopathy in the chest or  abdomen. Normal LDH level suggests against a lymphoproliferative disorder. HIV negative Hepatitis C negative 2) lymphocytosis in peripheral blood   -Flow cytometry showed no evidence of a monoclonal B cell positive typically aberrant T-cell population. No evidence of a lymphoproliferative disorder based on absence of lymphadenopathy or hepatosplenomegaly. 3) elevated ferritin level -likely reactive due to inflammation Hemochromatosis panel negative for mutation 3) Elevated sedimentation rate and CRP- no overt evidence of malignancy on CT chest abdomen pelvis.  no overt focal symptoms suggesting specific infection . Given her significant fatigue - concern for possible autoimmune condition. Mother has a history of rheumatoid arthritis. Component  Latest Ref Rng & Units 09/23/2016  SSA (Ro) (ENA) Antibody, IgG     <1.0 NEG AI <1.0 NEG  SSB (La) (ENA) Antibody, IgG     <1.0 NEG AI <1.0 NEG  Complement C3, Serum     82 - 167 mg/dL 161  Complement C4, Serum     14 - 44 mg/dL 36  RA Latex Turbid.     0.0 - 13.9 IU/mL 11.2  CCP Antibodies IgG/IgA     0 - 19 units 8  ENA SM Ab Ser-aCnc     <1.0 NEG AI <1.0 NEG  Sed Rate     0 - 32 mm/hr 83 (H)  CRP     0.0 - 4.9 mg/L 50.1 (H)  ANA Ab, IFA      Negative  dsDNA Ab     0 - 9 IU/mL 1   Plan -Continue multivitamin daily to address any unknown nutritional deficiencies. --Would strongly recommend having a rheumatology evaluation to rule out the possibility of autoimmune conditions/vasculitis. -get Tuberculin testing/Quantiferon testing with PCP  4) Hypokalemia -persistent and previously had hypomagnesemia and hypercalcemia. ?persistent . Patient denies findings of eating disorder. Consideration was poor oral intake but doesn't quite explain her hypercalcemia except with possibly some dehydration. Thyroid function tests within normal limits. PTH level within normal limits Normal vitamin D level ACE levels wnl making sarcoidosis less  likely. No overt evidence of lymphoma. PLAN -Hypercalcemia has resolved. -Still having persistent hypokalemia -given a prescription for potassium replacement. -I discussed and ruled out any laxative or diuretic abuse. Recommend patient follow up with primary care physician to evaluation to rule out other hormonal or renal disorders such as mild case of Bartter/Gitelman syndrome, hyperaldo etc and consider nephrology consultation. -Will defer additional electrolyte replacement and workup to primary care physician.  -Continue close follow-up with primary care physician .Sanda Linger, MD in 2-3 weeks.  We shall discharge her from our care at this time and see her back only on an as-needed basis. Patient is agreeable with this plan.  Orders Placed This Encounter  Procedures  . Comprehensive metabolic panel    Standing Status:   Future    Number of Occurrences:   1    Standing Expiration Date:   01/06/2018    All of the patients questions were answered with apparent satisfaction. The patient knows to call the clinic with any problems, questions or concerns.  I spent 20 minutes counseling the patient face to face. The total time spent in the appointment was 25 minutes and more than 50% was on counseling and direct patient cares.    Wyvonnia Lora MD Stacey AAHIVMS [facility] Hematology/Oncology Physician [facility]  (Office):       [phone] (Work cell):  [phone] (Fax):           [phone]

## 2017-04-07 ENCOUNTER — Other Ambulatory Visit (INDEPENDENT_AMBULATORY_CARE_PROVIDER_SITE_OTHER): Payer: BLUE CROSS/BLUE SHIELD

## 2017-04-07 ENCOUNTER — Ambulatory Visit (INDEPENDENT_AMBULATORY_CARE_PROVIDER_SITE_OTHER): Payer: BLUE CROSS/BLUE SHIELD | Admitting: Internal Medicine

## 2017-04-07 ENCOUNTER — Encounter: Payer: Self-pay | Admitting: Internal Medicine

## 2017-04-07 VITALS — BP 110/80 | HR 91 | Temp 99.3°F | Resp 16 | Ht 64.0 in | Wt 144.8 lb

## 2017-04-07 DIAGNOSIS — I10 Essential (primary) hypertension: Secondary | ICD-10-CM

## 2017-04-07 DIAGNOSIS — D72820 Lymphocytosis (symptomatic): Secondary | ICD-10-CM | POA: Diagnosis not present

## 2017-04-07 LAB — CBC WITH DIFFERENTIAL/PLATELET
Basophils Absolute: 0 10*3/uL (ref 0.0–0.1)
Basophils Relative: 0.8 % (ref 0.0–3.0)
EOS ABS: 0.1 10*3/uL (ref 0.0–0.7)
EOS PCT: 1.4 % (ref 0.0–5.0)
HCT: 36 % (ref 36.0–46.0)
HEMOGLOBIN: 12 g/dL (ref 12.0–15.0)
Lymphocytes Relative: 59.8 % — ABNORMAL HIGH (ref 12.0–46.0)
Lymphs Abs: 3.8 10*3/uL (ref 0.7–4.0)
MCHC: 33.3 g/dL (ref 30.0–36.0)
MCV: 79.1 fl (ref 78.0–100.0)
MONO ABS: 0.4 10*3/uL (ref 0.1–1.0)
Monocytes Relative: 6.6 % (ref 3.0–12.0)
Neutro Abs: 2 10*3/uL (ref 1.4–7.7)
Neutrophils Relative %: 31.4 % — ABNORMAL LOW (ref 43.0–77.0)
Platelets: 284 10*3/uL (ref 150.0–400.0)
RBC: 4.55 Mil/uL (ref 3.87–5.11)
RDW: 12.4 % (ref 11.5–15.5)
WBC: 6.3 10*3/uL (ref 4.0–10.5)

## 2017-04-07 NOTE — Progress Notes (Signed)
Subjective:  Patient ID: [name], female    DOB: 09-15-1996  Age: 21 y.o. MRN: [id number]  CC: Hypertension   HPI KELLI ROBECK presents for f/up - She has intentionally lost about 15 pounds over then the last 6 weeks, she is fasting for Ramadan. She offers no other complaints today.  Outpatient Medications Prior to Visit  Medication Sig Dispense Refill  . Norgestimate-Ethinyl Estradiol Triphasic (TRI-PREVIFEM) 0.18/0.215/0.25 MG-35 MCG tablet Take 1 tablet by mouth at bedtime.    [name] omeprazole (PRILOSEC) 20 MG capsule Take 20 mg by mouth at bedtime.    . potassium chloride SA (K-DUR,KLOR-CON) 20 MEQ tablet Take 1 tablet (20 mEq total) by mouth 2 (two) times daily. (Patient not taking: Reported on 04/07/2017) 60 tablet 3   No facility-administered medications prior to visit.     ROS Review of Systems  Constitutional: Positive for unexpected weight change. Negative for appetite change, chills, diaphoresis and fatigue.  HENT: Negative.  Negative for trouble swallowing.   Eyes: Negative for visual disturbance.  Respiratory: Negative for cough, chest tightness, shortness of breath and wheezing.   Cardiovascular: Negative for chest pain, palpitations and leg swelling.  Gastrointestinal: Negative for abdominal pain, constipation, diarrhea, nausea and vomiting.  Endocrine: Negative.   Genitourinary: Negative.  Negative for difficulty urinating.  Musculoskeletal: Negative.  Negative for back pain.  Skin: Negative.  Negative for color change and rash.  Neurological: Negative.   Hematological: Negative.  Negative for adenopathy. Does not bruise/bleed easily.  Psychiatric/Behavioral: Negative.     Objective:  BP 110/80 (BP Location: Left Arm, Patient Position: Sitting, Cuff Size: Normal)   Pulse 91   Temp 99.3 F (37.4 C) (Oral)   Resp 16   Ht 5\' 4"  (1.626 m)   Wt 144 lb 12 oz (65.7 kg)   LMP 03/24/2017 (Within Days)   SpO2 99%   BMI 24.85 kg/m   BP Readings from Last 3  Encounters:  04/07/17 110/80  02/23/17 110/80  01/06/17 130/90    Wt Readings from Last 3 Encounters:  04/07/17 144 lb 12 oz (65.7 kg)  02/23/17 159 lb 12 oz (72.5 kg)  01/06/17 151 lb 4.8 oz (68.6 kg)    Physical Exam  Constitutional: She is oriented to person, place, and time. No distress.  HENT:  Mouth/Throat: Oropharynx is clear and moist. No oropharyngeal exudate.  Eyes: Conjunctivae are normal. Right eye exhibits no discharge. Left eye exhibits no discharge. No scleral icterus.  Neck: Normal range of motion. Neck supple. No JVD present. No tracheal deviation present. No thyromegaly present.  Cardiovascular: Normal rate, regular rhythm, normal heart sounds and intact distal pulses.  Exam reveals no gallop and no friction rub.   No murmur heard. Pulmonary/Chest: Effort normal and breath sounds normal. No stridor. No respiratory distress. She has no wheezes. She has no rales. She exhibits no tenderness.  Abdominal: Soft. Bowel sounds are normal. She exhibits no distension and no mass. There is no hepatosplenomegaly. There is no tenderness. There is no rebound and no guarding.  Musculoskeletal: Normal range of motion. She exhibits no edema or tenderness.  Lymphadenopathy:    She has no cervical adenopathy.  Neurological: She is oriented to person, place, and time. No cranial nerve deficit.  Skin: Skin is warm and dry. No rash noted. She is not diaphoretic. No erythema. No pallor.  Vitals reviewed.   Lab Results  Component Value Date   WBC 6.3 04/07/2017   HGB 12.0 04/07/2017   HCT  36.0 04/07/2017   PLT 284.0 04/07/2017   GLUCOSE 72 02/23/2017   CHOL 196 07/21/2016   TRIG 209 (H) 07/21/2016   HDL 39 (L) 07/21/2016   LDLCALC 115 (H) 07/21/2016   ALT 27 01/06/2017   AST 26 01/06/2017   NA 140 02/23/2017   K 3.7 02/23/2017   CL 102 02/23/2017   CREATININE 0.86 02/23/2017   BUN 10 02/23/2017   CO2 30 02/23/2017   TSH 0.44 10/22/2016   HGBA1C 5.2 07/27/2016    No  results found.  Assessment & Plan:   Lynnell Grain was seen today for hypertension.  Diagnoses and all orders for this visit:  Atypical lymphocytosis- she has a mild, persistent lymphocytosis and mild neutropenia. This was thoroughly evaluated by hematology a few months ago. I see no significant changes at this time and she is symptomatic. I don't think she needs any further lymphoproliferative workup at this time other than a repeat flow cytometry. I will recheck her CBC in about 4 months and she has been advised to let me know sooner if she develops any new symptoms. -     CBC with Differential/Platelet; Future -     Flow Cytometry; Future  Essential hypertension- her blood pressure is adequately well-controlled on no medications, her electrolytes and renal function are normal.   I have discontinued Ms. Gramm's cetirizine, clindamycin, adapalene, fluconazole, doxycycline, lisinopril, clotrimazole-betamethasone, metFORMIN, metFORMIN, and Norgestim-Eth Estrad Triphasic (TRI-PREVIFEM PO). I am also having her maintain her omeprazole, Norgestimate-Ethinyl Estradiol Triphasic, potassium chloride SA, diphenhydrAMINE, and ibuprofen.  Meds ordered this encounter  Medications  . DISCONTD: cetirizine (ZYRTEC) 10 MG tablet    Sig: cetirizine 10 mg tablet  TAKE 1 TABLET (10 MG) BY ORAL ROUTE ONCE DAILY  . DISCONTD: clindamycin (CLEOCIN T) 1 % lotion    Sig: clindamycin 1 % lotion  APPLY TOPICALLY 2 TIMES DAILY.  [name] DISCONTD: adapalene (DIFFERIN) 0.1 % gel    Sig: Differin 0.1 % topical gel  APPLY TOPICALLY NIGHTLY.  . DISCONTD: fluconazole (DIFLUCAN) 150 MG tablet    Sig: Diflucan 150 mg tablet  Take 1 tablet every day by oral route for 1 day.  [name] DISCONTD: doxycycline (VIBRAMYCIN) 100 MG capsule    Sig: doxycycline hyclate 100 mg capsule  TAKE 1 CAPSULE (100 MG TOTAL) BY MOUTH 2 TIMES DAILY.  [name] DISCONTD: lisinopril (PRINIVIL,ZESTRIL) 5 MG tablet    Sig: lisinopril 5 mg tablet  TAKE 2 TABLETS (10  MG TOTAL) BY MOUTH DAILY.  [name] DISCONTD: clotrimazole-betamethasone (LOTRISONE) cream    Sig: Lotrisone 1 %-0.05 % topical cream  Apply 1 application twice a day by topical route for 5 days.  [name] DISCONTD: metFORMIN (GLUMETZA) 500 MG (MOD) 24 hr tablet    Sig: metformin ER 500 mg 24 hr tablet,extended release  Take 3 tablets every day by oral route.  [name] DISCONTD: metFORMIN (GLUCOPHAGE-XR) 500 MG 24 hr tablet    Sig: metformin ER 500 mg tablet,extended release 24 hr  TAKE 1 TAB ONCE DAILY X2WKS THEN 2 DAILY X2WKS THEN 3 DAILY AS DIRECTED  . DISCONTD: Lorita Officer Triphasic (TRI-PREVIFEM PO)    Sig: Tri-Previfem (28) 0.18 mg(7)/0.215 mg(7)/0.25 mg(7)-35 mcg tablet  TAKE 1 TABLET BY MOUTH EVERY DAY  . DISCONTD: omeprazole (PRILOSEC) 20 MG capsule    Sig: omeprazole 20 mg capsule,delayed release  TAKE 1 CAPSULE (20 MG TOTAL) BY MOUTH DAILY.  . diphenhydrAMINE (BENADRYL) 25 MG tablet    Sig: Take 25 mg by mouth every 6 (six) hours  as needed.  [name]. ibuprofen (ADVIL,MOTRIN) 200 MG tablet    Sig: Take 200 mg by mouth every 6 (six) hours as needed.     Follow-up: Return in about 6 months (around 10/08/2017).  Sanda Lingerhomas Ousmane Seeman, MD

## 2017-04-07 NOTE — Patient Instructions (Signed)

## 2017-04-08 ENCOUNTER — Encounter: Payer: Self-pay | Admitting: Internal Medicine

## 2017-04-16 ENCOUNTER — Other Ambulatory Visit: Payer: Self-pay | Admitting: Internal Medicine

## 2017-04-16 DIAGNOSIS — K219 Gastro-esophageal reflux disease without esophagitis: Secondary | ICD-10-CM

## 2017-04-29 LAB — HM PAP SMEAR

## 2017-06-09 ENCOUNTER — Other Ambulatory Visit: Payer: Self-pay | Admitting: Internal Medicine

## 2017-06-09 DIAGNOSIS — E876 Hypokalemia: Secondary | ICD-10-CM

## 2017-06-21 ENCOUNTER — Encounter: Payer: Self-pay | Admitting: Family Medicine

## 2017-06-21 ENCOUNTER — Ambulatory Visit (INDEPENDENT_AMBULATORY_CARE_PROVIDER_SITE_OTHER): Payer: BLUE CROSS/BLUE SHIELD | Admitting: Family Medicine

## 2017-06-21 ENCOUNTER — Ambulatory Visit: Payer: BLUE CROSS/BLUE SHIELD | Admitting: Internal Medicine

## 2017-06-21 ENCOUNTER — Encounter: Payer: Self-pay | Admitting: *Deleted

## 2017-06-21 VITALS — BP 110/80 | HR 90 | Temp 98.2°F | Ht 64.0 in

## 2017-06-21 DIAGNOSIS — R112 Nausea with vomiting, unspecified: Secondary | ICD-10-CM | POA: Diagnosis not present

## 2017-06-21 MED ORDER — ONDANSETRON HCL 4 MG PO TABS: 4.0000 mg | ORAL_TABLET | Freq: Three times a day (TID) | ORAL | 0 refills | Status: DC | PRN

## 2017-06-21 NOTE — Patient Instructions (Signed)
Plenty of fluids with electrolytes. Sip slowly.  No dairy for 1 week.  Zofran as needed per instructions.  Follow up with your primary doctor in 1 week and sooner if worsening or new concerns.   Gastritis, Adult Gastritis is swelling (inflammation) of the stomach. When you have this condition, you can have these problems (symptoms):  Pain in your stomach.  A burning feeling in your stomach.  Feeling sick to your stomach (nauseous).  Throwing up (vomiting).  Feeling too full after you eat.  It is important to get help for this condition. Without help, your stomach can bleed, and you can get sores (ulcers) in your stomach. Follow these instructions at home:  Take over-the-counter and prescription medicines only as told by your doctor.  If you were prescribed an antibiotic medicine, take it as told by your doctor. Do not stop taking it even if you start to feel better.  Drink enough fluid to keep your pee (urine) clear or pale yellow.  Instead of eating big meals, eat small meals often. Contact a health care provider if:  Your problems get worse.  Your problems go away and then come back. Get help right away if:  You throw up blood or something that looks like coffee grounds.  You have black or dark red poop (stools).  You cannot keep fluids down.  Your stomach pain gets worse.  You have a fever.  You do not feel better after 1 week. This information is not intended to replace advice given to you by your health care provider. Make sure you discuss any questions you have with your health care provider. Document Released: 04/13/2008 Document Revised: 06/24/2016 Document Reviewed: 07/20/2015 Elsevier Interactive Patient Education  [employer]2018 Elsevier Inc.

## 2017-06-21 NOTE — Progress Notes (Signed)
HPI:  Acute visit for nausea and vomiting: -started last night acutely - Symptoms include: Upset stomach, nausea, vomiting - several times last night and several times today, question mild low-grade fever - subjective - Normal to loose bowel movement yesterday - Denies focal or persistent or severe abdominal pain, dysuria, vaginal symptoms,inability to tolerate oral intake of fluids, melena, hematochezia, subjective fever, headache -Works in [employer]nel [employer] - needs work note -first day last menstrual period: 2 weeks ago  ROS: See pertinent positives and negatives per HPI.  Past Medical History:  Diagnosis Date  . Dyspnea    breathing heavyeven when sitting, lying dpwn,exertion  . GERD (gastroesophageal reflux disease)   . Headache   . History of blood transfusion    prematurity, this was during perinatal period  . Hypertension   . Morbid obesity (HCC) 04/03/2016  . PCOS (polycystic ovarian syndrome) 04/03/2016  . Reflux     Past Surgical History:  Procedure Laterality Date  . CHOLECYSTECTOMY N/A 11/25/2016   Procedure: LAPAROSCOPIC CHOLECYSTECTOMY;  Surgeon: Axel Filler, MD;  Location: [facility] OR;  Service: General;  Laterality: N/A;  . ESOPHAGOGASTRODUODENOSCOPY N/A 09/26/2016   Procedure: ESOPHAGOGASTRODUODENOSCOPY (EGD);  Surgeon: Beverley Fiedler, MD;  Location: [facility];  Service: Endoscopy;  Laterality: N/A;  . eustachion tubes      Family History  Problem Relation Age of Onset  . GER disease Mother   . Hypertension Mother   . GER disease Father   . Hypertension Father   . Lactose intolerance Brother   . Hypertension Maternal Grandfather   . Cancer Neg Hx   . Depression Neg Hx   . Drug abuse Neg Hx   . Early death Neg Hx   . Heart disease Neg Hx   . Hyperlipidemia Neg Hx   . Kidney disease Neg Hx   . Alcohol abuse Neg Hx   . Asthma Neg Hx   . Stroke Neg Hx     Social History   Social History  . Marital status: Single    Spouse name: N/A  . Number of  children: N/A  . Years of education: N/A   Social History Main Topics  . Smoking status: Never Smoker  . Smokeless tobacco: Never Used  . Alcohol use No  . Drug use: No  . Sexual activity: No   Other Topics Concern  . None   Social History Narrative   Studying mass communications at [employer]     Current Outpatient Prescriptions:  .  ibuprofen (ADVIL,MOTRIN) 200 MG tablet, Take 200 mg by mouth every 6 (six) hours as needed., Disp: , Rfl:  .  Norgestimate-Ethinyl Estradiol Triphasic (TRI-PREVIFEM) 0.18/0.215/0.25 MG-35 MCG tablet, Take 1 tablet by mouth at bedtime., Disp: , Rfl:  .  omeprazole (PRILOSEC) 20 MG capsule, TAKE 1 CAPSULE (20 MG TOTAL) BY MOUTH DAILY., Disp: 90 capsule, Rfl: 3 .  ondansetron (ZOFRAN) 4 MG tablet, Take 1 tablet (4 mg total) by mouth every 8 (eight) hours as needed for nausea or vomiting., Disp: 20 tablet, Rfl: 0  EXAM:  Vitals:   06/21/17 1535  BP: 110/80  Pulse: 90  Temp: 98.2 F (36.8 C)    There is no height or weight on file to calculate BMI.  GENERAL: vitals reviewed and listed above, alert, oriented, appears well hydrated and in no acute distress  HEENT: atraumatic, conjunttiva clear, no obvious abnormalities on inspection of external nose and ears  NECK: no obvious masses on inspection  LUNGS: clear to auscultation bilaterally, no  wheezes, rales or rhonchi, good air movement  CV: HRRR, no peripheral edema  ABD: bowel sounds positive in all 4 quadrants, soft, nontender to palpation, no rebound or guarding  MS: moves all extremities without noticeable abnormality  PSYCH: pleasant and cooperative, no obvious depression or anxiety  ASSESSMENT AND PLAN:  Discussed the following assessment and plan:  Nausea and vomiting, intractability of vomiting not specified, unspecified vomiting type  -we discussed possible serious and likely etiologies, workup and treatment, treatment risks and return precautions - suspect viral given benign exam  and vitals are okay -after this discussion, Stacey Holland opted for Zofran, prompt reevaluation if worsening or ifnot improving over the next 24-48 hours -follow up advised in 1 week with PCP given her history -of course, we advised Roneisha  to return or notify a doctor immediately if symptoms worsen or persist or new concerns arise.   Patient Instructions  Plenty of fluids with electrolytes. Sip slowly.  No dairy for 1 week.  Zofran as needed per instructions.  Follow up with your primary doctor in 1 week and sooner if worsening or new concerns.   Gastritis, Adult Gastritis is swelling (inflammation) of the stomach. When you have this condition, you can have these problems (symptoms):  Pain in your stomach.  A burning feeling in your stomach.  Feeling sick to your stomach (nauseous).  Throwing up (vomiting).  Feeling too full after you eat.  It is important to get help for this condition. Without help, your stomach can bleed, and you can get sores (ulcers) in your stomach. Follow these instructions at home:  Take over-the-counter and prescription medicines only as told by your doctor.  If you were prescribed an antibiotic medicine, take it as told by your doctor. Do not stop taking it even if you start to feel better.  Drink enough fluid to keep your pee (urine) clear or pale yellow.  Instead of eating big meals, eat small meals often. Contact a health care provider if:  Your problems get worse.  Your problems go away and then come back. Get help right away if:  You throw up blood or something that looks like coffee grounds.  You have black or dark red poop (stools).  You cannot keep fluids down.  Your stomach pain gets worse.  You have a fever.  You do not feel better after 1 week. This information is not intended to replace advice given to you by your health care provider. Make sure you discuss any questions you have with your health care provider. Document Released:  04/13/2008 Document Revised: 06/24/2016 Document Reviewed: 07/20/2015 Elsevier Interactive Patient Education  [address]2018 Elsevier Inc.    [place], Dahlia ClientHANNAH R., DO

## 2017-06-22 ENCOUNTER — Encounter: Payer: Self-pay | Admitting: Internal Medicine

## 2017-06-22 ENCOUNTER — Other Ambulatory Visit (INDEPENDENT_AMBULATORY_CARE_PROVIDER_SITE_OTHER): Payer: BLUE CROSS/BLUE SHIELD

## 2017-06-22 ENCOUNTER — Ambulatory Visit (INDEPENDENT_AMBULATORY_CARE_PROVIDER_SITE_OTHER)
Admission: RE | Admit: 2017-06-22 | Discharge: 2017-06-22 | Disposition: A | Discharge status: Home / Self Care | Payer: BLUE CROSS/BLUE SHIELD | Source: Ambulatory Visit | Attending: Internal Medicine | Admitting: Internal Medicine

## 2017-06-22 ENCOUNTER — Telehealth: Payer: Self-pay | Admitting: Internal Medicine

## 2017-06-22 ENCOUNTER — Ambulatory Visit (INDEPENDENT_AMBULATORY_CARE_PROVIDER_SITE_OTHER): Payer: BLUE CROSS/BLUE SHIELD | Admitting: Internal Medicine

## 2017-06-22 VITALS — BP 112/80 | HR 80 | Temp 98.9°F | Resp 16 | Ht 64.0 in | Wt 138.0 lb

## 2017-06-22 DIAGNOSIS — F502 Bulimia nervosa, unspecified: Secondary | ICD-10-CM | POA: Insufficient documentation

## 2017-06-22 DIAGNOSIS — R1012 Left upper quadrant pain: Secondary | ICD-10-CM | POA: Diagnosis not present

## 2017-06-22 DIAGNOSIS — D72820 Lymphocytosis (symptomatic): Secondary | ICD-10-CM

## 2017-06-22 DIAGNOSIS — E876 Hypokalemia: Secondary | ICD-10-CM

## 2017-06-22 DIAGNOSIS — K529 Noninfective gastroenteritis and colitis, unspecified: Secondary | ICD-10-CM

## 2017-06-22 HISTORY — DX: Bulimia nervosa, unspecified: F50.20

## 2017-06-22 LAB — COMPREHENSIVE METABOLIC PANEL
ALBUMIN: 4.4 g/dL (ref 3.5–5.2)
ALT: 18 U/L (ref 0–35)
AST: 23 U/L (ref 0–37)
Alkaline Phosphatase: 69 U/L (ref 39–117)
BUN: 20 mg/dL (ref 6–23)
CHLORIDE: 92 meq/L — AB (ref 96–112)
CO2: 36 meq/L — AB (ref 19–32)
CREATININE: 1.01 mg/dL (ref 0.40–1.20)
Calcium: 10.3 mg/dL (ref 8.4–10.5)
GFR: 73.16 mL/min (ref >60.00)
GLUCOSE: 80 mg/dL (ref 70–99)
POTASSIUM: 2.8 meq/L — AB (ref 3.5–5.1)
SODIUM: 139 meq/L (ref 135–145)
TOTAL PROTEIN: 7.8 g/dL (ref 6.0–8.3)
Total Bilirubin: 0.6 mg/dL (ref 0.2–1.2)

## 2017-06-22 LAB — CBC WITH DIFFERENTIAL/PLATELET
BASOS PCT: 1.2 % (ref 0.0–3.0)
Basophils Absolute: 0.1 10*3/uL (ref 0.0–0.1)
EOS ABS: 0 10*3/uL (ref 0.0–0.7)
EOS PCT: 1 % (ref 0.0–5.0)
HCT: 38.6 % (ref 36.0–46.0)
Hemoglobin: 12.9 g/dL (ref 12.0–15.0)
LYMPHS ABS: 2.7 10*3/uL (ref 0.7–4.0)
Lymphocytes Relative: 61.9 % — ABNORMAL HIGH (ref 12.0–46.0)
MCHC: 33.3 g/dL (ref 30.0–36.0)
MCV: 79.9 fl (ref 78.0–100.0)
MONO ABS: 0.3 10*3/uL (ref 0.1–1.0)
Monocytes Relative: 7.4 % (ref 3.0–12.0)
NEUTROS ABS: 1.2 10*3/uL — AB (ref 1.4–7.7)
Neutrophils Relative %: 28.5 % — ABNORMAL LOW (ref 43.0–77.0)
PLATELETS: 275 10*3/uL (ref 150.0–400.0)
RBC: 4.83 Mil/uL (ref 3.87–5.11)
RDW: 12.4 % (ref 11.5–15.5)
WBC: 4.3 10*3/uL (ref 4.0–10.5)

## 2017-06-22 LAB — HCG, QUANTITATIVE, PREGNANCY: QUANTITATIVE HCG: 0.06 m[IU]/mL

## 2017-06-22 LAB — URINALYSIS, ROUTINE W REFLEX MICROSCOPIC
Hgb urine dipstick: NEGATIVE
LEUKOCYTES UA: NEGATIVE
Nitrite: NEGATIVE
PH: 6.5 (ref 5.0–8.0)
SPECIFIC GRAVITY, URINE: 1.02 (ref 1.000–1.030)
Total Protein, Urine: 100 — AB
Urine Glucose: 100 — AB
Urobilinogen, UA: 4 — AB (ref 0.0–1.0)

## 2017-06-22 LAB — AMYLASE: AMYLASE: 137 U/L — AB (ref 27–131)

## 2017-06-22 LAB — LIPASE: Lipase: 12 U/L (ref 11.0–59.0)

## 2017-06-22 LAB — MAGNESIUM: MAGNESIUM: 1.6 mg/dL (ref 1.5–2.5)

## 2017-06-22 MED ORDER — POTASSIUM CHLORIDE 20 MEQ PO PACK: 20.0000 meq | PACK | Freq: Three times a day (TID) | ORAL | 2 refills | Status: DC

## 2017-06-22 MED ORDER — PROMETHAZINE HCL 25 MG/ML IJ SOLN
25.0000 mg | Freq: Once | INTRAMUSCULAR | Status: AC
  Administered 2017-06-22: 25 mg via INTRAMUSCULAR

## 2017-06-22 MED ORDER — PROMETHAZINE HCL 25 MG PO TABS: 25.0000 mg | ORAL_TABLET | Freq: Three times a day (TID) | ORAL | 0 refills | Status: DC | PRN

## 2017-06-22 NOTE — Telephone Encounter (Signed)
CRITICAL VALUE STICKER  CRITICAL VALUE: Potassium 2.8  RECEIVER (on-site recipient of call): pete  DATE & TIME NOTIFIED: 06/22/2017 at 1:21pm  MESSENGER (representative from lab): Yvone NeuShanika  MD NOTIFIED:  yes  TIME OF NOTIFICATION: 1:21pm  RESPONSE: Dr. Yetta BarreJones is aware.

## 2017-06-22 NOTE — Patient Instructions (Signed)

## 2017-06-22 NOTE — Progress Notes (Signed)
Subjective:  Patient ID: Stacey Holland, female    DOB: January 29, 1996  Age: 21 y.o. MRN: [id number]  CC: Abdominal Pain   HPI Braelyn Z Ziebell presents for a 2-3 day history of nausea, vomiting, and intermittent left upper quadrant pain. She was seen one day prior to this elsewhere and Zofran was tried but she says it's not helped her symptoms. She was told that this is probably a stomach virus.  Outpatient Medications Prior to Visit  Medication Sig Dispense Refill  . Norgestimate-Ethinyl Estradiol Triphasic (TRI-PREVIFEM) 0.18/0.215/0.25 MG-35 MCG tablet Take 1 tablet by mouth at bedtime.    [name] omeprazole (PRILOSEC) 20 MG capsule TAKE 1 CAPSULE (20 MG TOTAL) BY MOUTH DAILY. 90 capsule 3  . ibuprofen (ADVIL,MOTRIN) 200 MG tablet Take 200 mg by mouth every 6 (six) hours as needed.    . ondansetron (ZOFRAN) 4 MG tablet Take 1 tablet (4 mg total) by mouth every 8 (eight) hours as needed for nausea or vomiting. 20 tablet 0   No facility-administered medications prior to visit.     ROS Review of Systems  Constitutional: Positive for unexpected weight change (wt loss). Negative for diaphoresis and fatigue.  HENT: Negative.  Negative for trouble swallowing and voice change.   Eyes: Negative for visual disturbance.  Respiratory: Negative for cough, chest tightness, shortness of breath and wheezing.   Cardiovascular: Negative for chest pain, palpitations and leg swelling.  Gastrointestinal: Positive for abdominal pain, nausea and vomiting. Negative for abdominal distention, blood in stool, constipation and diarrhea.  Endocrine: Negative.   Genitourinary: Negative.  Negative for decreased urine volume, difficulty urinating, dysuria, frequency and urgency.  Musculoskeletal: Negative.  Negative for arthralgias, myalgias and neck pain.  Allergic/Immunologic: Negative.   Neurological: Positive for weakness. Negative for dizziness, tremors, light-headedness and numbness.  Hematological: Negative.   Negative for adenopathy. Does not bruise/bleed easily.  Psychiatric/Behavioral: Negative.     Objective:  BP 112/80 (BP Location: Left Arm, Patient Position: Sitting, Cuff Size: Normal)   Pulse 80   Temp 98.9 F (37.2 C) (Oral)   Resp 16   Ht 5\' 4"  (1.626 m)   Wt 138 lb (62.6 kg)   LMP 06/07/2017 (Exact Date)   SpO2 98%   BMI 23.69 kg/m   BP Readings from Last 3 Encounters:  06/22/17 112/80  06/21/17 110/80  04/07/17 110/80    Wt Readings from Last 3 Encounters:  06/22/17 138 lb (62.6 kg)  04/07/17 144 lb 12 oz (65.7 kg)  02/23/17 159 lb 12 oz (72.5 kg)    Physical Exam  Constitutional: She is oriented to person, place, and time.  Non-toxic appearance. She does not have a sickly appearance. She does not appear ill. No distress.  HENT:  Mouth/Throat: Oropharynx is clear and moist. Mucous membranes are not pale, not dry and not cyanotic. No oropharyngeal exudate.  Eyes: Conjunctivae are normal. Right eye exhibits no discharge. Left eye exhibits no discharge. No scleral icterus.  Neck: Normal range of motion. Neck supple. No JVD present. No thyromegaly present.  Cardiovascular: Normal rate, regular rhythm and intact distal pulses.  Exam reveals no gallop and no friction rub.   No murmur heard. Pulmonary/Chest: Effort normal and breath sounds normal. No respiratory distress. She has no wheezes. She has no rales. She exhibits no tenderness.  Abdominal: Soft. Bowel sounds are normal. She exhibits no distension and no mass. There is no hepatosplenomegaly, splenomegaly or hepatomegaly. There is tenderness in the left upper quadrant. There is no rebound,  no guarding and no CVA tenderness. No hernia. Hernia confirmed negative in the ventral area.  Musculoskeletal: Normal range of motion. She exhibits no edema or tenderness.  Lymphadenopathy:    She has no cervical adenopathy.  Neurological: She is alert and oriented to person, place, and time.  Skin: She is not diaphoretic.  Vitals  reviewed.   Lab Results  Component Value Date   WBC 4.3 06/22/2017   HGB 12.9 06/22/2017   HCT 38.6 06/22/2017   PLT 275.0 06/22/2017   GLUCOSE 80 06/22/2017   CHOL 196 07/21/2016   TRIG 209 (H) 07/21/2016   HDL 39 (L) 07/21/2016   LDLCALC 115 (H) 07/21/2016   ALT 18 06/22/2017   AST 23 06/22/2017   NA 139 06/22/2017   K 2.8 (LL) 06/22/2017   CL 92 (L) 06/22/2017   CREATININE 1.01 06/22/2017   BUN 20 06/22/2017   CO2 36 (H) 06/22/2017   TSH 0.44 10/22/2016   HGBA1C 5.2 07/27/2016    No results found.  Assessment & Plan:   Lynnell Grain was seen today for abdominal pain.  Diagnoses and all orders for this visit:  Atypical lymphocytosis- this is stable with no evidence of lymphoproliferative disease at this time. She will continue to follow with hematology regarding this. -     CBC with Differential/Platelet; Future  Hypokalemia- this is caused by nausea and vomiting and she is symptomatic. Will start potassium replacement therapy and Phenergan to help her keep down the potassium supplements. -     Comprehensive metabolic panel; Future -     Magnesium; Future -     potassium chloride (KLOR-CON) 20 MEQ packet; Take 20 mEq by mouth 3 (three) times daily.  Hypomagnesemia- her magnesium level is normal now. -     Magnesium; Future  Vomiting associated with bulimia nervosa with nausea- I don't think she has a stomach virus. This is been a consistent pattern with her for the night last few years. I think she has an eating disorder. The patient and her mother are not agreeable with this and are not willing to have her treated for this. Will treat her current symptoms with Phenergan. -     Lipase; Future -     Amylase; Future -     Urinalysis, Routine w reflex microscopic; Future -     hCG, quantitative, pregnancy; Future -     DG Abd Acute W/Chest; Future -     promethazine (PHENERGAN) injection 25 mg; Inject 1 mL (25 mg total) into the muscle once.  Gastroenteritis, acute -      promethazine (PHENERGAN) injection 25 mg; Inject 1 mL (25 mg total) into the muscle once.  Left upper quadrant pain- her abdominal exam is not consistent with an acute process. Plain films are negative for small or large bowel obstruction or air-fluid levels. Her lab work is negative for any concerns for acidosis, abdominal infection, or other complications. Will treat the symptoms and she will let me know if she has any new or worsening symptoms. -     Lipase; Future -     Amylase; Future -     DG Abd Acute W/Chest; Future  Other orders -     promethazine (PHENERGAN) 25 MG tablet; Take 1 tablet (25 mg total) by mouth every 8 (eight) hours as needed for nausea or vomiting.   I have discontinued Ms. Brix's ibuprofen and ondansetron. I am also having her start on promethazine and potassium chloride. Additionally, I am having  her maintain her Norgestimate-Ethinyl Estradiol Triphasic and omeprazole. We administered promethazine.  Meds ordered this encounter  Medications  . promethazine (PHENERGAN) 25 MG tablet    Sig: Take 1 tablet (25 mg total) by mouth every 8 (eight) hours as needed for nausea or vomiting.    Dispense:  20 tablet    Refill:  0  . potassium chloride (KLOR-CON) 20 MEQ packet    Sig: Take 20 mEq by mouth 3 (three) times daily.    Dispense:  90 packet    Refill:  2  . promethazine (PHENERGAN) injection 25 mg     Follow-up: Return in about 2 days (around 06/24/2017).  Sanda Lingerhomas Ferd Horrigan, MD

## 2017-07-05 ENCOUNTER — Other Ambulatory Visit: Payer: Self-pay | Admitting: Internal Medicine

## 2017-07-05 DIAGNOSIS — E876 Hypokalemia: Secondary | ICD-10-CM

## 2017-07-05 MED ORDER — POTASSIUM CHLORIDE CRYS ER 20 MEQ PO TBCR: 20.0000 meq | EXTENDED_RELEASE_TABLET | Freq: Three times a day (TID) | ORAL | 3 refills | Status: DC

## 2017-08-23 ENCOUNTER — Ambulatory Visit (INDEPENDENT_AMBULATORY_CARE_PROVIDER_SITE_OTHER): Payer: BLUE CROSS/BLUE SHIELD

## 2017-08-23 DIAGNOSIS — Z23 Encounter for immunization: Secondary | ICD-10-CM

## 2017-12-01 ENCOUNTER — Encounter: Payer: Self-pay | Admitting: Internal Medicine

## 2017-12-01 ENCOUNTER — Other Ambulatory Visit (INDEPENDENT_AMBULATORY_CARE_PROVIDER_SITE_OTHER): Payer: BLUE CROSS/BLUE SHIELD

## 2017-12-01 ENCOUNTER — Ambulatory Visit: Payer: BLUE CROSS/BLUE SHIELD | Admitting: Internal Medicine

## 2017-12-01 VITALS — BP 110/80 | HR 89 | Temp 98.4°F | Resp 16 | Ht 64.0 in | Wt 130.5 lb

## 2017-12-01 DIAGNOSIS — D72820 Lymphocytosis (symptomatic): Secondary | ICD-10-CM | POA: Diagnosis not present

## 2017-12-01 DIAGNOSIS — E876 Hypokalemia: Secondary | ICD-10-CM | POA: Diagnosis not present

## 2017-12-01 DIAGNOSIS — F502 Bulimia nervosa, unspecified: Secondary | ICD-10-CM | POA: Insufficient documentation

## 2017-12-01 DIAGNOSIS — K5904 Chronic idiopathic constipation: Secondary | ICD-10-CM

## 2017-12-01 DIAGNOSIS — F509 Eating disorder, unspecified: Secondary | ICD-10-CM | POA: Diagnosis not present

## 2017-12-01 DIAGNOSIS — R41840 Attention and concentration deficit: Secondary | ICD-10-CM | POA: Diagnosis not present

## 2017-12-01 HISTORY — DX: Chronic idiopathic constipation: K59.04

## 2017-12-01 LAB — CBC WITH DIFFERENTIAL/PLATELET
BASOS PCT: 1.1 % (ref 0.0–3.0)
Basophils Absolute: 0.1 10*3/uL (ref 0.0–0.1)
EOS PCT: 1.8 % (ref 0.0–5.0)
Eosinophils Absolute: 0.1 10*3/uL (ref 0.0–0.7)
HCT: 41.5 % (ref 36.0–46.0)
Hemoglobin: 13.9 g/dL (ref 12.0–15.0)
LYMPHS ABS: 3.2 10*3/uL (ref 0.7–4.0)
Lymphocytes Relative: 68 % — ABNORMAL HIGH (ref 12.0–46.0)
MCHC: 33.5 g/dL (ref 30.0–36.0)
MCV: 80.1 fl (ref 78.0–100.0)
MONO ABS: 0.3 10*3/uL (ref 0.1–1.0)
Monocytes Relative: 6 % (ref 3.0–12.0)
NEUTROS ABS: 1.1 10*3/uL — AB (ref 1.4–7.7)
NEUTROS PCT: 23.1 % — AB (ref 43.0–77.0)
PLATELETS: 281 10*3/uL (ref 150.0–400.0)
RBC: 5.18 Mil/uL — AB (ref 3.87–5.11)
RDW: 12.7 % (ref 11.5–15.5)
WBC: 4.7 10*3/uL (ref 4.0–10.5)

## 2017-12-01 MED ORDER — LINACLOTIDE 72 MCG PO CAPS: 72.0000 ug | ORAL_CAPSULE | Freq: Every day | ORAL | 1 refills | Status: DC

## 2017-12-01 MED ORDER — LISDEXAMFETAMINE DIMESYLATE 20 MG PO CAPS: 20.0000 mg | ORAL_CAPSULE | Freq: Every day | ORAL | 0 refills | Status: DC

## 2017-12-01 MED ORDER — CITALOPRAM HYDROBROMIDE 20 MG PO TABS: 20.0000 mg | ORAL_TABLET | Freq: Every day | ORAL | 1 refills | Status: DC

## 2017-12-01 NOTE — Patient Instructions (Signed)

## 2017-12-01 NOTE — Progress Notes (Signed)
Subjective:  Patient ID: Stacey Holland, female    DOB: [birth year]  Age: 22 y.o. MRN: [id number]  CC: Constipation   HPI Stacey Holland presents for f/up - She tells me that since I last saw her she has been to her home country of [place] where she saw a psychiatrist.  She was diagnosed with ADHD and an eating disorder that includes anorexia and bulimia.  The ADHD was diagnosed based on poor attention span, hyperactivity, and difficulty completing tasks at school and at home.  She has been prescribed Ritalin and citalopram.  She complains that Ritalin makes her feel restless and she wants to try something different.  She also wants to be referred to a psychiatrist and psychologist here in [place].  Outpatient Medications Prior to Visit  Medication Sig Dispense Refill  . Biotin 1000 MCG tablet Take 1,000 mcg by mouth 3 (three) times daily.    . Multiple Vitamin (MULTIVITAMIN) tablet Take 1 tablet by mouth daily.    . Norgestimate-Ethinyl Estradiol Triphasic (TRI-PREVIFEM) 0.18/0.215/0.25 MG-35 MCG tablet Take 1 tablet by mouth at bedtime.    . Omega-3 Fatty Acids (OMEGA-3 PLUS PO) Take by mouth.    [name]. omeprazole (PRILOSEC) 20 MG capsule TAKE 1 CAPSULE (20 MG TOTAL) BY MOUTH DAILY. 90 capsule 3  . potassium chloride SA (K-DUR,KLOR-CON) 20 MEQ tablet Take 1 tablet (20 mEq total) by mouth 3 (three) times daily. 90 tablet 3  . promethazine (PHENERGAN) 25 MG tablet Take 1 tablet (25 mg total) by mouth every 8 (eight) hours as needed for nausea or vomiting. 20 tablet 0   No facility-administered medications prior to visit.     ROS Review of Systems  Constitutional: Negative for appetite change, diaphoresis, fatigue and unexpected weight change.  HENT: Negative.  Negative for trouble swallowing.   Eyes: Negative for visual disturbance.  Respiratory: Negative.  Negative for cough, chest tightness, shortness of breath and wheezing.   Cardiovascular: Negative for chest pain, palpitations and leg  swelling.  Gastrointestinal: Positive for constipation. Negative for abdominal pain, diarrhea, nausea and vomiting.  Genitourinary: Negative.  Negative for difficulty urinating.  Musculoskeletal: Negative.  Negative for back pain and myalgias.  Skin: Negative.  Negative for color change and rash.  Neurological: Negative.  Negative for dizziness, weakness, numbness and headaches.  Psychiatric/Behavioral: Positive for decreased concentration. Negative for agitation, behavioral problems, confusion, self-injury, sleep disturbance and suicidal ideas. The patient is hyperactive. The patient is not nervous/anxious.     Objective:  BP 110/80 (BP Location: Left Arm, Patient Position: Sitting, Cuff Size: Normal)   Pulse 89   Temp 98.4 F (36.9 C) (Oral)   Resp 16   Ht 5\' 4"  (1.626 m)   Wt 130 lb 8 oz (59.2 kg)   LMP 11/27/2017 (Exact Date)   SpO2 98%   BMI 22.40 kg/m   BP Readings from Last 3 Encounters:  12/01/17 110/80  06/22/17 112/80  06/21/17 110/80    Wt Readings from Last 3 Encounters:  12/01/17 130 lb 8 oz (59.2 kg)  06/22/17 138 lb (62.6 kg)  04/07/17 144 lb 12 oz (65.7 kg)    Physical Exam  Constitutional: She is oriented to person, place, and time. No distress.  HENT:  Mouth/Throat: No oropharyngeal exudate.  Eyes: Conjunctivae are normal. Left eye exhibits no discharge. No scleral icterus.  Neck: Normal range of motion. Neck supple. No JVD present. No thyromegaly present.  Cardiovascular: Normal rate, regular rhythm and normal heart sounds. Exam reveals no  gallop.  No murmur heard. Pulmonary/Chest: Effort normal and breath sounds normal. No respiratory distress. She has no wheezes. She has no rales.  Abdominal: Soft. Bowel sounds are normal. She exhibits no distension and no mass. There is no tenderness.  Musculoskeletal: Normal range of motion. She exhibits no edema or tenderness.  Lymphadenopathy:    She has no cervical adenopathy.  Neurological: She is alert and  oriented to person, place, and time.  Skin: Skin is dry. No rash noted. She is not diaphoretic. No erythema. No pallor.  Psychiatric: She has a normal mood and affect. Her behavior is normal. Judgment and thought content normal.  Vitals reviewed.   Lab Results  Component Value Date   WBC 4.7 12/01/2017   HGB 13.9 12/01/2017   HCT 41.5 12/01/2017   PLT 281.0 12/01/2017   GLUCOSE 102 (H) 12/01/2017   CHOL 196 07/21/2016   TRIG 209 (H) 07/21/2016   HDL 39 (L) 07/21/2016   LDLCALC 115 (H) 07/21/2016   ALT 12 12/01/2017   AST 16 12/01/2017   NA 138 12/01/2017   K 3.3 (L) 12/01/2017   CL 97 12/01/2017   CREATININE 0.88 12/01/2017   BUN 16 12/01/2017   CO2 31 12/01/2017   TSH 0.58 12/01/2017   HGBA1C 5.2 07/27/2016    Dg Abd Acute W/chest  Result Date: 06/22/2017 CLINICAL DATA:  Left upper quadrant pain associated with nausea and vomiting and weakness for the past 2 days. History of morbid obesity, gastroesophageal reflux. EXAM: DG ABDOMEN ACUTE W/ 1V CHEST COMPARISON:  Chest x-ray of December 24, 2015 and CT scan of the abdomen and pelvis of September 01, 2016. FINDINGS: The lungs are adequately inflated and clear. The heart and mediastinal structures are normal. There is no pleural effusion. The trachea is midline. Within the abdomen the bowel gas pattern is normal. There are no abnormal soft tissue calcifications. The bony structures exhibit no acute abnormalities. IMPRESSION: There is no acute cardiopulmonary abnormality nor acute intra-abdominal abnormality. If the patient's symptoms persist, abdominal and pelvic CT scanning would be a useful next imaging step. Electronically Signed   By: David  [place]and M.D.   On: 06/22/2017 11:41    Assessment & Plan:   Stacey Holland was seen today for constipation.  Diagnoses and all orders for this visit:  Eating disorder -     Ambulatory referral to Psychology -     Ambulatory referral to Psychiatry -     citalopram (CELEXA) 20 MG tablet; Take 1  tablet (20 mg total) by mouth daily.  Attention and concentration deficit- she will try Vyvanse -     Ambulatory referral to Psychology -     [facility] -     lisdexamfetamine (VYVANSE) 20 MG capsule; Take 1 capsule (20 mg total) by mouth daily. -     Ambulatory referral to Psychology  Hypokalemia- this is caused by her eating disorder, will treat with K+ replacement tx -     Comprehensive metabolic panel; Future -     potassium chloride (K-DUR) 10 MEQ tablet; Take 1 tablet (10 mEq total) by mouth 3 (three) times daily.  Hypomagnesemia- her Mg++ level is normal now -     Comprehensive metabolic panel; Future -     Magnesium; Future  Chronic idiopathic constipation- Her labs are negative for secondary or organic causes.  Will treat with Linzess. -     linaclotide (LINZESS) 72 MCG capsule; Take 1 capsule (72 mcg total) by  mouth daily before breakfast. 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 Ibbotson's promethazine and potassium chloride SA. I am also having her start on lisdexamfetamine, linaclotide, citalopram, and potassium chloride. Additionally, I am having her maintain her Norgestimate-Ethinyl Estradiol Triphasic, omeprazole, Omega-3 Fatty Acids (OMEGA-3 PLUS PO), Biotin, and multivitamin.  Meds ordered this encounter  Medications  . lisdexamfetamine (VYVANSE) 20 MG capsule    Sig: Take 1 capsule (20 mg total) by mouth daily.    Dispense:  30 capsule    Refill:  0  . linaclotide (LINZESS) 72 MCG capsule    Sig: Take 1 capsule (72 mcg total) by mouth daily before breakfast.    Dispense:  90 capsule    Refill:  1  . citalopram (CELEXA) 20 MG tablet    Sig: Take 1 tablet (20 mg total) by mouth daily.    Dispense:  90 tablet     Refill:  1  . potassium chloride (K-DUR) 10 MEQ tablet    Sig: Take 1 tablet (10 mEq total) by mouth 3 (three) times daily.    Dispense:  90 tablet    Refill:  2     Follow-up: Return in about 3 months (around 03/01/2018).  Sanda Linger, MD

## 2017-12-02 ENCOUNTER — Encounter: Payer: Self-pay | Admitting: Internal Medicine

## 2017-12-02 LAB — COMPREHENSIVE METABOLIC PANEL
ALT: 12 U/L (ref 0–35)
AST: 16 U/L (ref 0–37)
Albumin: 3.9 g/dL (ref 3.5–5.2)
Alkaline Phosphatase: 44 U/L (ref 39–117)
BUN: 16 mg/dL (ref 6–23)
CHLORIDE: 97 meq/L (ref 96–112)
CO2: 31 meq/L (ref 19–32)
Calcium: 9.4 mg/dL (ref 8.4–10.5)
Creatinine, Ser: 0.88 mg/dL (ref 0.40–1.20)
GFR: 85.41 mL/min (ref >60.00)
GLUCOSE: 102 mg/dL — AB (ref 70–99)
Potassium: 3.3 mEq/L — ABNORMAL LOW (ref 3.5–5.1)
SODIUM: 138 meq/L (ref 135–145)
Total Bilirubin: 0.3 mg/dL (ref 0.2–1.2)
Total Protein: 7.7 g/dL (ref 6.0–8.3)

## 2017-12-02 LAB — THYROID PANEL WITH TSH
Free Thyroxine Index: 2.9 (ref 1.4–3.8)
T3 UPTAKE: 28 % (ref 22–35)
T4 TOTAL: 10.4 ug/dL (ref 5.1–11.9)
TSH: 0.58 mIU/L

## 2017-12-02 LAB — MAGNESIUM: MAGNESIUM: 1.6 mg/dL (ref 1.5–2.5)

## 2017-12-05 MED ORDER — POTASSIUM CHLORIDE ER 10 MEQ PO TBCR: 10.0000 meq | EXTENDED_RELEASE_TABLET | Freq: Three times a day (TID) | ORAL | 2 refills | Status: DC

## 2017-12-11 ENCOUNTER — Encounter: Payer: Self-pay | Admitting: Internal Medicine

## 2017-12-14 ENCOUNTER — Other Ambulatory Visit: Payer: Self-pay | Admitting: Internal Medicine

## 2017-12-14 DIAGNOSIS — R41840 Attention and concentration deficit: Secondary | ICD-10-CM

## 2017-12-14 MED ORDER — VAYARIN PLUS PO CAPS: 1.0000 | ORAL_CAPSULE | Freq: Two times a day (BID) | ORAL | 1 refills | Status: DC

## 2017-12-15 ENCOUNTER — Other Ambulatory Visit: Payer: Self-pay | Admitting: Internal Medicine

## 2017-12-15 DIAGNOSIS — R41840 Attention and concentration deficit: Secondary | ICD-10-CM

## 2017-12-22 ENCOUNTER — Telehealth: Payer: Self-pay | Admitting: Internal Medicine

## 2017-12-22 NOTE — Telephone Encounter (Signed)
Vyvanse refill request controlled substance She has appt for 12/27/17 but needs pills to last her until then. Had question regarding lab draw. [facility] [address] [facility] Yet[facility] Bar[facility]Jones[facility] pt

## 2017-12-22 NOTE — Telephone Encounter (Signed)
Copied from CRM [id number]. Topic: Quick Communication - See Telephone Encounter >> Dec 22, 2017  9:56 AM Joana ReamerWarren, Amber N wrote: CRM for notification. See Telephone encounter for:   Patient needs refill on Vyvanse. [facility] [address] Appt scheduled for Monday 12/27/17, but she doesn't think she has enough to last until then. Patient still waiting to get appt with ADHD specialist, so that's why she needs Dr. Yetta BarreJones to refill it. Patient also stated that she needs lab orders placed to get labs drawn. Please advise. 12/22/17.

## 2017-12-22 NOTE — Telephone Encounter (Signed)
Contacted pt and informed that the rx was sent in on the 23rd of January. Pt stated that she has not picked up that rx. Informed pt to speak with the pharmacy and confirm they have the rx.   Pt requesting blood work. Pt has an up coming appt to see PCP. Pt will wait until then for blood work.

## 2017-12-27 ENCOUNTER — Other Ambulatory Visit (INDEPENDENT_AMBULATORY_CARE_PROVIDER_SITE_OTHER): Payer: BLUE CROSS/BLUE SHIELD

## 2017-12-27 ENCOUNTER — Encounter: Payer: Self-pay | Admitting: Internal Medicine

## 2017-12-27 ENCOUNTER — Ambulatory Visit: Payer: BLUE CROSS/BLUE SHIELD | Admitting: Internal Medicine

## 2017-12-27 VITALS — BP 140/90 | HR 77 | Temp 99.0°F | Resp 16 | Ht 64.0 in | Wt 132.5 lb

## 2017-12-27 DIAGNOSIS — R41840 Attention and concentration deficit: Secondary | ICD-10-CM

## 2017-12-27 DIAGNOSIS — E876 Hypokalemia: Secondary | ICD-10-CM

## 2017-12-27 DIAGNOSIS — D72820 Lymphocytosis (symptomatic): Secondary | ICD-10-CM

## 2017-12-27 LAB — CBC WITH DIFFERENTIAL/PLATELET
Basophils Absolute: 0 10*3/uL (ref 0.0–0.1)
Basophils Relative: 0.7 % (ref 0.0–3.0)
EOS PCT: 3.7 % (ref 0.0–5.0)
Eosinophils Absolute: 0.1 10*3/uL (ref 0.0–0.7)
HCT: 36.6 % (ref 36.0–46.0)
Hemoglobin: 12.2 g/dL (ref 12.0–15.0)
LYMPHS ABS: 2.4 10*3/uL (ref 0.7–4.0)
Lymphocytes Relative: 69.2 % — ABNORMAL HIGH (ref 12.0–46.0)
MCHC: 33.2 g/dL (ref 30.0–36.0)
MCV: 81 fl (ref 78.0–100.0)
Monocytes Absolute: 0.3 10*3/uL (ref 0.1–1.0)
Monocytes Relative: 7.6 % (ref 3.0–12.0)
NEUTROS ABS: 0.7 10*3/uL — AB (ref 1.4–7.7)
NEUTROS PCT: 18.8 % — AB (ref 43.0–77.0)
PLATELETS: 223 10*3/uL (ref 150.0–400.0)
RBC: 4.52 Mil/uL (ref 3.87–5.11)
RDW: 13 % (ref 11.5–15.5)
WBC: 3.5 10*3/uL — ABNORMAL LOW (ref 4.0–10.5)

## 2017-12-27 LAB — BASIC METABOLIC PANEL
BUN: 8 mg/dL (ref 6–23)
CALCIUM: 8.8 mg/dL (ref 8.4–10.5)
CO2: 23 meq/L (ref 19–32)
Chloride: 105 mEq/L (ref 96–112)
Creatinine, Ser: 0.76 mg/dL (ref 0.40–1.20)
GFR: 101.09 mL/min (ref >60.00)
GLUCOSE: 85 mg/dL (ref 70–99)
POTASSIUM: 3.8 meq/L (ref 3.5–5.1)
SODIUM: 137 meq/L (ref 135–145)

## 2017-12-27 LAB — SEDIMENTATION RATE: Sed Rate: 23 mm/hr — ABNORMAL HIGH (ref 0–20)

## 2017-12-27 NOTE — Patient Instructions (Signed)

## 2017-12-27 NOTE — Progress Notes (Signed)
Subjective:  Patient ID: Stacey Holland, female    DOB: 09-26-1996  Age: 22 y.o. MRN: [id number]  CC: Sore Throat   HPI Stacey Holland presents for a 2-day history of sore throat and mild nonproductive cough.  She denies fever, chills, lymphadenopathy, or rash.  Pt states ADD status overall stable on current meds with overall good compliance and tolerability, and good effectiveness with respect to ability for concentration and task completion.  Outpatient Medications Prior to Visit  Medication Sig Dispense Refill  . Biotin 1000 MCG tablet Take 1,000 mcg by mouth 3 (three) times daily.    . citalopram (CELEXA) 20 MG tablet Take 1 tablet (20 mg total) by mouth daily. 90 tablet 1  . Dietary Management Product (VAYARIN PLUS) CAPS Take 1 capsule by mouth 2 (two) times daily. 180 capsule 1  . linaclotide (LINZESS) 72 MCG capsule Take 1 capsule (72 mcg total) by mouth daily before breakfast. 90 capsule 1  . Multiple Vitamin (MULTIVITAMIN) tablet Take 1 tablet by mouth daily.    . Norgestimate-Ethinyl Estradiol Triphasic (TRI-PREVIFEM) 0.18/0.215/0.25 MG-35 MCG tablet Take 1 tablet by mouth at bedtime.    . Omega-3 Fatty Acids (OMEGA-3 PLUS PO) Take by mouth.    [name] omeprazole (PRILOSEC) 20 MG capsule TAKE 1 CAPSULE (20 MG TOTAL) BY MOUTH DAILY. 90 capsule 3  . lisdexamfetamine (VYVANSE) 20 MG capsule Take 1 capsule (20 mg total) by mouth daily. 30 capsule 0  . potassium chloride (K-DUR) 10 MEQ tablet Take 1 tablet (10 mEq total) by mouth 3 (three) times daily. 90 tablet 2   No facility-administered medications prior to visit.     ROS Review of Systems  Constitutional: Negative for chills, fatigue and fever.  HENT: Positive for sore throat. Negative for facial swelling, sinus pressure, trouble swallowing and voice change.   Eyes: Negative.   Respiratory: Positive for cough. Negative for chest tightness, shortness of breath and wheezing.   Gastrointestinal: Negative for abdominal pain,  constipation, diarrhea, nausea and vomiting.  Endocrine: Negative.   Genitourinary: Negative.  Negative for difficulty urinating.  Musculoskeletal: Negative.   Skin: Negative.  Negative for color change and rash.  Allergic/Immunologic: Negative.   Neurological: Negative.  Negative for dizziness, weakness and numbness.  Hematological: Negative for adenopathy. Does not bruise/bleed easily.  Psychiatric/Behavioral: Negative.  Negative for decreased concentration, dysphoric mood and suicidal ideas. The patient is not nervous/anxious.     Objective:  BP 140/90 (BP Location: Left Arm, Patient Position: Sitting, Cuff Size: Normal)   Pulse 77   Temp 99 F (37.2 C) (Oral)   Resp 16   Ht 5\' 4"  (1.626 m)   Wt 132 lb 8 oz (60.1 kg)   LMP 12/23/2017   SpO2 99%   BMI 22.74 kg/m   BP Readings from Last 3 Encounters:  12/27/17 140/90  12/01/17 110/80  06/22/17 112/80    Wt Readings from Last 3 Encounters:  12/27/17 132 lb 8 oz (60.1 kg)  12/01/17 130 lb 8 oz (59.2 kg)  06/22/17 138 lb (62.6 kg)    Physical Exam  Constitutional: She is oriented to person, place, and time.  Non-toxic appearance. She does not have a sickly appearance. She does not appear ill. No distress.  HENT:  Mouth/Throat: Mucous membranes are normal. Mucous membranes are not pale, not dry and not cyanotic. No oral lesions. No trismus in the jaw. No uvula swelling. Posterior oropharyngeal erythema present. No oropharyngeal exudate, posterior oropharyngeal edema or tonsillar abscesses.  Eyes:  Conjunctivae are normal. Left eye exhibits no discharge. No scleral icterus.  Neck: Normal range of motion. Neck supple. No JVD present. No thyromegaly present.  Cardiovascular: Normal rate, regular rhythm and normal heart sounds. Exam reveals no gallop.  No murmur heard. Pulmonary/Chest: Effort normal and breath sounds normal. No respiratory distress. She has no wheezes. She has no rales.  Abdominal: Soft. Bowel sounds are normal.  She exhibits no distension and no mass. There is no tenderness.  Musculoskeletal: Normal range of motion. She exhibits no edema, tenderness or deformity.  Lymphadenopathy:    She has no cervical adenopathy.  Neurological: She is alert and oriented to person, place, and time.  Skin: Skin is warm and dry. No rash noted. She is not diaphoretic. No erythema. No pallor.  Vitals reviewed.   Lab Results  Component Value Date   WBC 3.5 (L) 12/27/2017   HGB 12.2 12/27/2017   HCT 36.6 12/27/2017   PLT 223.0 12/27/2017   GLUCOSE 85 12/27/2017   CHOL 196 07/21/2016   TRIG 209 (H) 07/21/2016   HDL 39 (L) 07/21/2016   LDLCALC 115 (H) 07/21/2016   ALT 12 12/01/2017   AST 16 12/01/2017   NA 137 12/27/2017   K 3.8 12/27/2017   CL 105 12/27/2017   CREATININE 0.76 12/27/2017   BUN 8 12/27/2017   CO2 23 12/27/2017   TSH 0.58 12/01/2017   HGBA1C 5.2 07/27/2016    Dg Abd Acute W/chest  Result Date: 06/22/2017 CLINICAL DATA:  Left upper quadrant pain associated with nausea and vomiting and weakness for the past 2 days. History of morbid obesity, gastroesophageal reflux. EXAM: DG ABDOMEN ACUTE W/ 1V CHEST COMPARISON:  Chest x-ray of December 24, 2015 and CT scan of the abdomen and pelvis of September 01, 2016. FINDINGS: The lungs are adequately inflated and clear. The heart and mediastinal structures are normal. There is no pleural effusion. The trachea is midline. Within the abdomen the bowel gas pattern is normal. There are no abnormal soft tissue calcifications. The bony structures exhibit no acute abnormalities. IMPRESSION: There is no acute cardiopulmonary abnormality nor acute intra-abdominal abnormality. If the patient's symptoms persist, abdominal and pelvic CT scanning would be a useful next imaging step. Electronically Signed   By: David  SwazilandJordan M.D.   On: 06/22/2017 11:41    Assessment & Plan:   Stacey Holland was seen today for sore throat.  Diagnoses and all orders for this visit:  Hypokalemia-  Her potassium level is in the normal range.  She will continue taking the potassium supplement. -     Basic metabolic panel; Future -     potassium chloride (KLOR-CON) 20 MEQ packet; Take 20 mEq by mouth daily.  Hypomagnesemia  Atypical lymphocytosis- She has an elevated lymphocyte count but her total white count is not elevated.  She is not anemic.  I have ordered an SPEP and flow cytometry to screen for lymphoproliferative disease.  This has been a recurrent finding in her CBCs over the last year so this is most likely a benign variant.  I will continue to monitor for complications. -     CBC with Differential/Platelet; Future -     Sedimentation rate; Future -     Protein electrophoresis, serum; Future -     Flow Cytometry; Future  Attention and concentration deficit -     lisdexamfetamine (VYVANSE) 20 MG capsule; Take 1 capsule (20 mg total) by mouth daily.   I have discontinued Etna Z. Stoffer's potassium chloride. I  am also having her start on potassium chloride. Additionally, I am having her maintain her Norgestimate-Ethinyl Estradiol Triphasic, omeprazole, Omega-3 Fatty Acids (OMEGA-3 PLUS PO), Biotin, multivitamin, linaclotide, citalopram, VAYARIN PLUS, and lisdexamfetamine.  Meds ordered this encounter  Medications  . lisdexamfetamine (VYVANSE) 20 MG capsule    Sig: Take 1 capsule (20 mg total) by mouth daily.    Dispense:  30 capsule    Refill:  0  . potassium chloride (KLOR-CON) 20 MEQ packet    Sig: Take 20 mEq by mouth daily.    Dispense:  180 packet    Refill:  0     Follow-up: Return in about 2 months (around 02/24/2018).  Sanda Linger, MD

## 2017-12-28 ENCOUNTER — Encounter: Payer: Self-pay | Admitting: Internal Medicine

## 2017-12-28 ENCOUNTER — Telehealth: Payer: Self-pay | Admitting: Internal Medicine

## 2017-12-28 ENCOUNTER — Other Ambulatory Visit: Payer: BLUE CROSS/BLUE SHIELD

## 2017-12-28 DIAGNOSIS — D72820 Lymphocytosis (symptomatic): Secondary | ICD-10-CM

## 2017-12-28 MED ORDER — POTASSIUM CHLORIDE 20 MEQ PO PACK: 20.0000 meq | PACK | Freq: Every day | ORAL | 0 refills | Status: DC

## 2017-12-28 MED ORDER — LISDEXAMFETAMINE DIMESYLATE 20 MG PO CAPS: 20.0000 mg | ORAL_CAPSULE | Freq: Every day | ORAL | 0 refills | Status: DC

## 2017-12-28 NOTE — Telephone Encounter (Signed)
Pt is requesting to following: Please advise and let me know if I can be of any assistance.  1. K+ supplement changed to a powder 2. Lab work results 3. Refill of Vyvanse

## 2017-12-28 NOTE — Telephone Encounter (Signed)
Copied from CRM [phone]. Topic: Quick Communication - See Telephone Encounter >> Dec 28, 2017  8:39 AM Arlyss Gandyichardson, Linlee Cromie N, NT wrote: CRM for notification. See Telephone encounter for: Pt is calling to see if she can have her potassium pills changed to the powder form and sent to her pharmacy[facility] [address] Also wanting to get her lab results and to have them transferred into her Mychart. She also states she came in yesterday to have her ADHD medication refilled and this was not sent to the pharmacy. She cannot remember the name of the medication just that it starts with a "z"  12/28/17.

## 2017-12-29 ENCOUNTER — Other Ambulatory Visit (HOSPITAL_COMMUNITY)
Admission: RE | Admit: 2017-12-29 | Discharge: 2017-12-29 | Disposition: A | Discharge status: Home / Self Care | Payer: BLUE CROSS/BLUE SHIELD | Source: Ambulatory Visit | Attending: Internal Medicine | Admitting: Internal Medicine

## 2017-12-29 DIAGNOSIS — D72820 Lymphocytosis (symptomatic): Secondary | ICD-10-CM | POA: Diagnosis present

## 2017-12-29 LAB — PROTEIN ELECTROPHORESIS, SERUM
ALPHA 1: 0.3 g/dL (ref 0.2–0.3)
ALPHA 2: 0.6 g/dL (ref 0.5–0.9)
Albumin ELP: 3.5 g/dL — ABNORMAL LOW (ref 3.8–4.8)
Beta 2: 0.4 g/dL (ref 0.2–0.5)
Beta Globulin: 0.4 g/dL (ref 0.4–0.6)
Gamma Globulin: 1.1 g/dL (ref 0.8–1.7)
TOTAL PROTEIN: 6.4 g/dL (ref 6.1–8.1)

## 2017-12-30 ENCOUNTER — Encounter: Payer: Self-pay | Admitting: Internal Medicine

## 2018-01-06 ENCOUNTER — Ambulatory Visit: Payer: Self-pay | Admitting: *Deleted

## 2018-01-06 ENCOUNTER — Encounter: Payer: Self-pay | Admitting: Internal Medicine

## 2018-01-06 ENCOUNTER — Telehealth: Payer: Self-pay | Admitting: Internal Medicine

## 2018-01-06 NOTE — Telephone Encounter (Signed)
Pt sent a mychart message regarding same and this has been forwarded to PCP. Closing this encounter.

## 2018-01-06 NOTE — Telephone Encounter (Signed)
Pt called because she has been prescribed potassium and when she takes the pill, she is not digesting it. It goes in whole and comes out the same way. She is feeling weak and dehydrated. She feels like she is having some problems with digesting  food anyway. She has potassium packets at the pharmacy but states she can not afford these. She wants to know if she can be prescribed potassium liquid that may be cheaper.  She would like a call back please. Her # is [phone].  Provider: Sanda Lingerhomas Jones, MD  [facility]  [phone]  [address]

## 2018-01-06 NOTE — Telephone Encounter (Signed)
Pt called because she has been prescribed potassium and when she takes the pill, she is not digesting it. It goes in whole and comes out the same way. She is feeling weak and dehydrated. She feels like she is having some problems with digesting  food anyway. She has potassium packets at the pharmacy but states she can not afford these. She wants to know if she can be prescribed potassium liquid that may be cheaper. Per protocol, message being sent to office.  She would like a call back please.  Reason for Disposition . [1] Request for URGENT new prescription or refill of "essential" medication (i.e., likelihood of harm to patient if not taken) AND [2] triager unable to fill per unit policy  Answer Assessment - Initial Assessment Questions 1. SYMPTOMS: "Do you have any symptoms?"     Feeling weak, "not myself", low potassium 2. SEVERITY: If symptoms are present, ask "Are they mild, moderate or severe?"     Moderated but is getting worse  Protocols used: MEDICATION QUESTION CALL-A-AH

## 2018-01-09 IMAGING — US US RENAL
1 series · 14 of 25 positions shown · non-contrast
Comparison: Complete abdominal ultrasound earlier this day.

CLINICAL DATA: Right flank pain for 2 days.  Acute kidney injury.

EXAM:
RENAL / URINARY TRACT ULTRASOUND COMPLETE

[Series 1: us renal · 0.25mm/px · 14 of 26 slices shown]
[im 1/26]
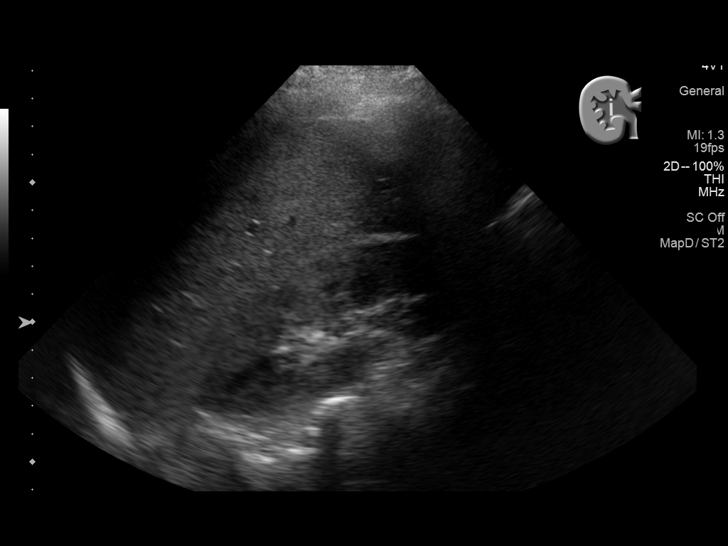
[im 3/26]
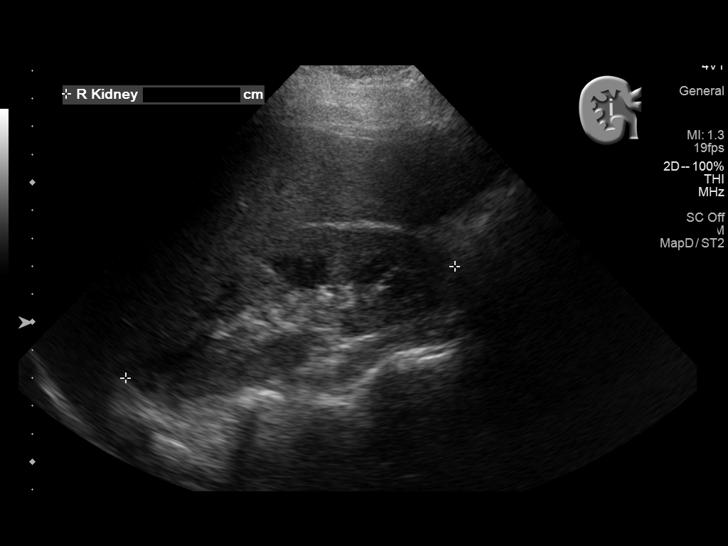
[im 5/26]
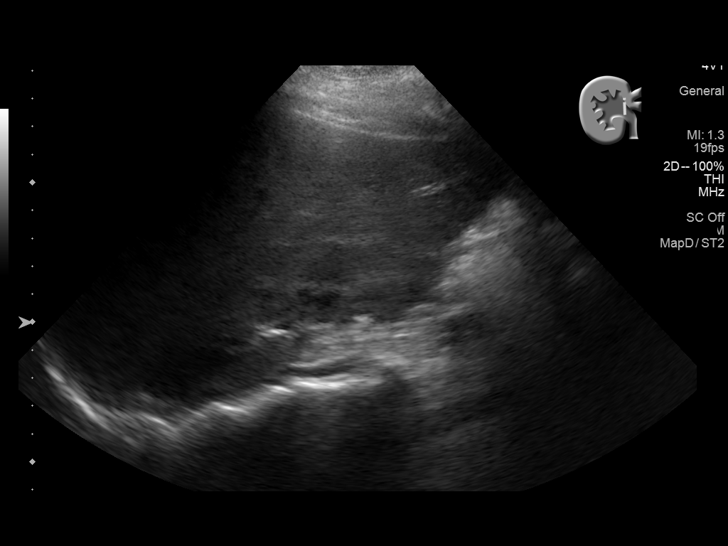
[im 7/26]
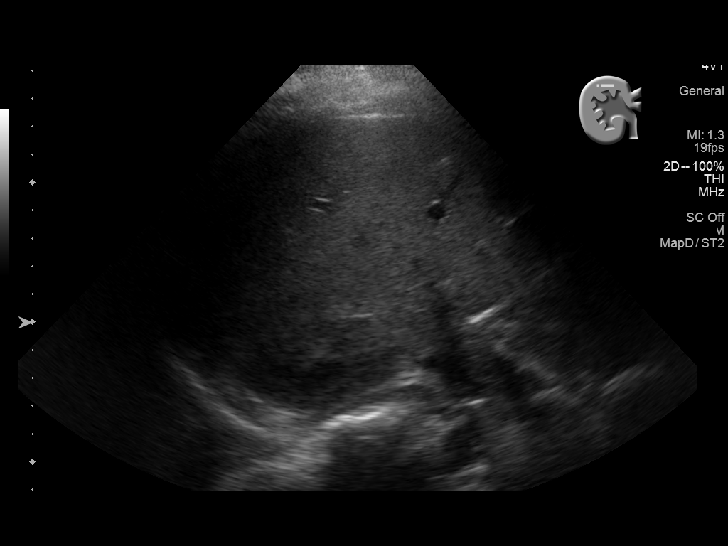
[im 9/26]
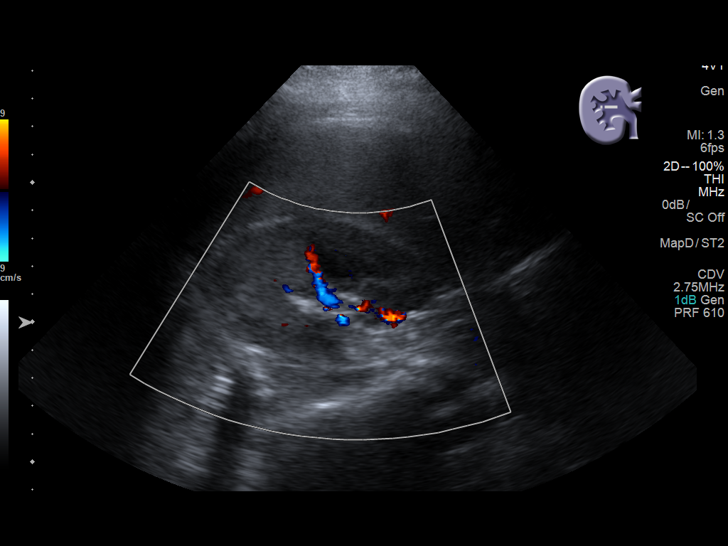
[im 10/26]
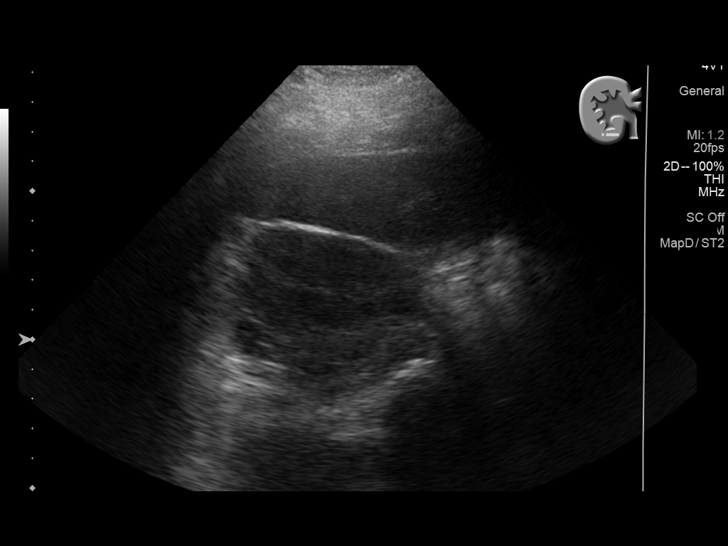
[im 12/26]
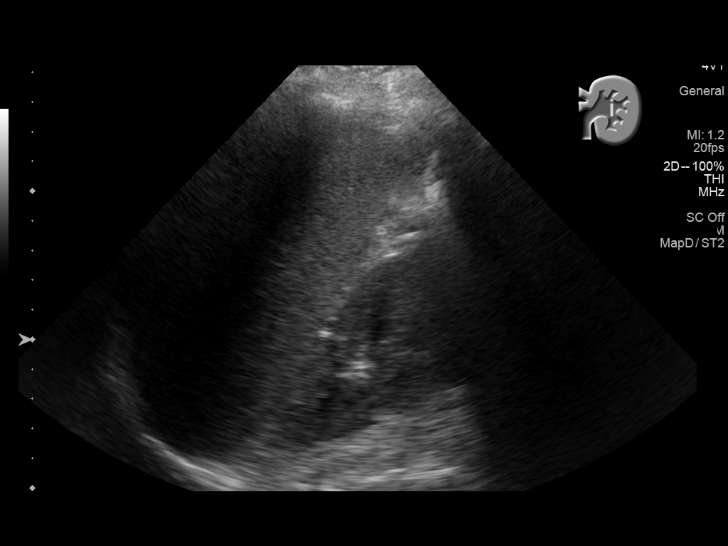
[im 14/26]
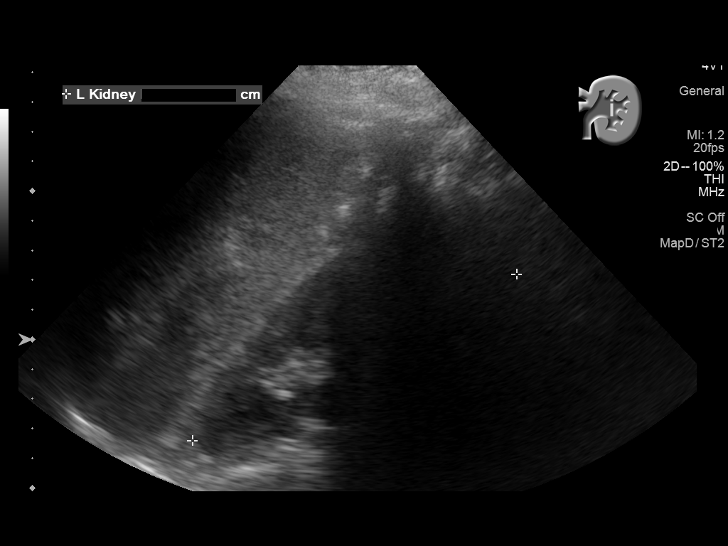
[im 16/26]
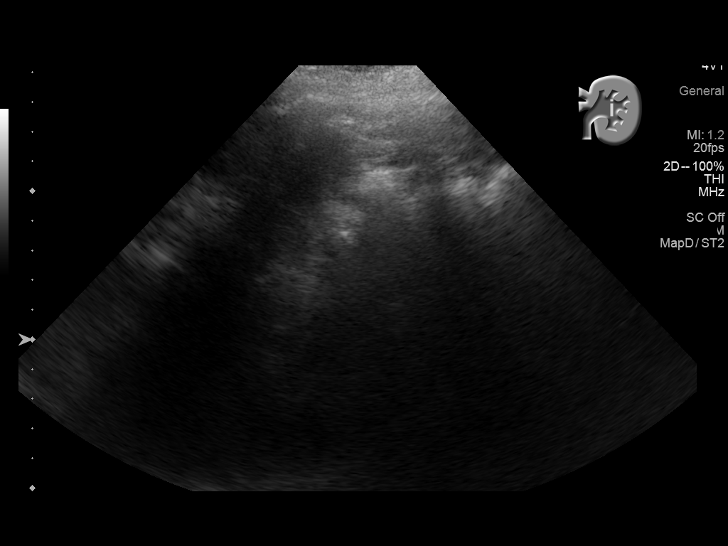
[im 17/26]
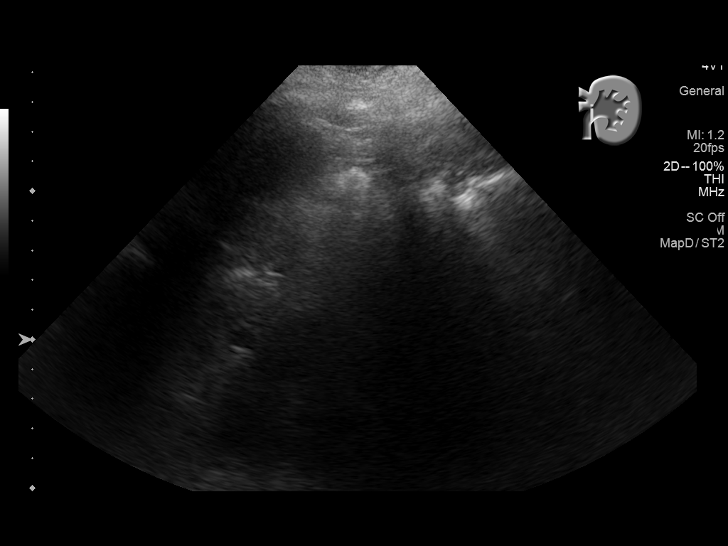
[im 19/26]
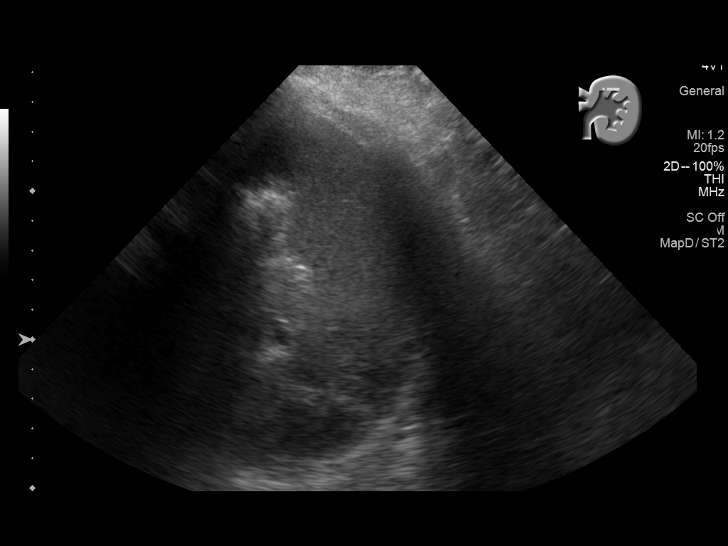
[im 21/26]
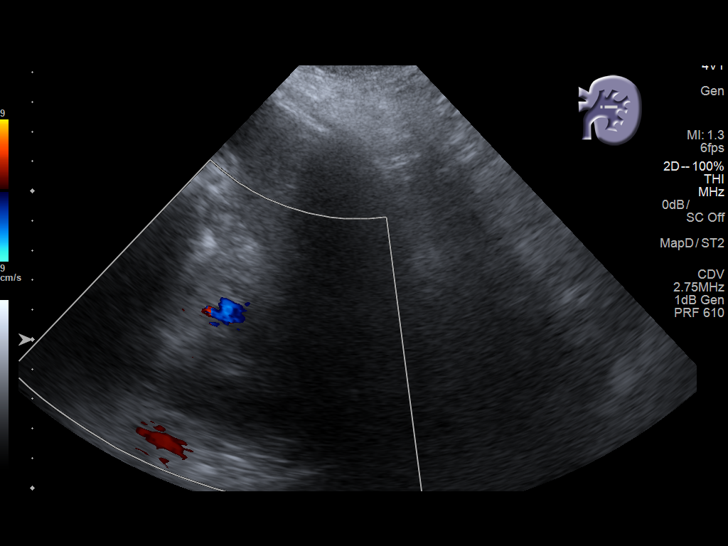
[im 23/26]
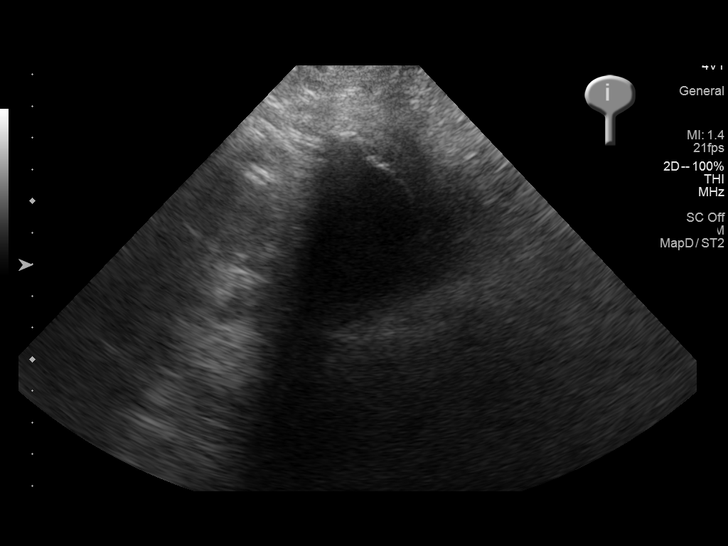
[im 26/26]
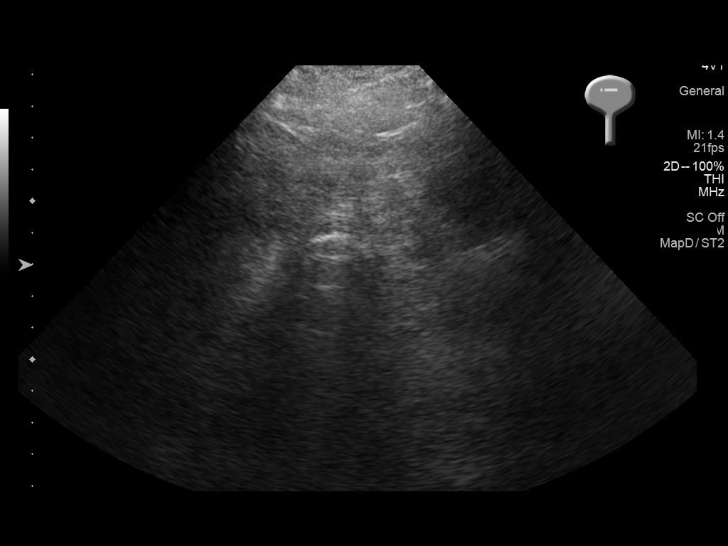

[14 of 25 positions shown; findings below may reference images not displayed]

FINDINGS: Right Kidney:

Length: 12.5 cm. Echogenicity within normal limits. No mass or
hydronephrosis visualized.

Left Kidney:

Length: Approximately 12.3 cm. The majority of the left kidney is
obscured by habitus and shadowing, bowel versus rib. There is no
evidence of hydronephrosis. This is better visualized on the prior
complete abdominal exam.

Bladder:

Decompressed by Foley catheter.
IMPRESSION: Normal sonographic appearance of the right kidney. No hydronephrosis
of the left kidney, poorly visualized on the current exam. Left
kidney better visualized on abdominal ultrasound performed 12 hours
prior.

## 2018-01-10 ENCOUNTER — Ambulatory Visit: Payer: Self-pay | Admitting: *Deleted

## 2018-01-10 NOTE — Telephone Encounter (Signed)
Stacey Holland Call back [phone]  Pt states she is still waiting for a call back. She has not had any potassium supplement since last week. She cannot digest the pills and the packets are $250. She cannot afford this. Pt is requesting the liquid or something inexpensive. Pt states she feels she is going to black out when she stands up (sent to triage nurse).  Pharmacy:  [name] [place] - [address] [phone] (Phone) [phone] (Fax)

## 2018-01-10 NOTE — Telephone Encounter (Signed)
Called pharmacy to confirm k chloride could be crushed. They stated that it could be without harm to the medication. PCP confirmed and gave verbal okay that pt could do that.  Pt contacted and informed of same. Pt stated understanding and will crush the tablets.

## 2018-01-10 NOTE — Telephone Encounter (Signed)
Pt reports feeling"lightheaded" x 3 days. Positional, states only when going from sitting to standing. "Feels like I may pass out , vision grows a little dark,lasts for few seconds only, then goes away" States also occurs if "standing too long." States has happened twice today, more frequently over the weekend. HR checked during call: 80.Has not passed out or fallen. Reports 1-2 episodes of vomiting over the weekend, 1 today with intermittent nausea. States is staying hydrated, drinking Pedialyte. No appetite, feels "achy." Denies any abdominal pain, SOB.  Seen by Dr. Yetta BarreJones 12/27/17 for cough and sore throat. Placed on K+ supplement which pt states has been taking "but it is not digesting." (level 3.8) Appt made with Dr. Jordan LikesSchmitz for tomorrow. Care advise given; instructed pt to go to ED if symptoms worsen.  Reason for Disposition . [1] MODERATE dizziness (e.g., interferes with normal activities) AND [2] has NOT been evaluated by physician for this  (Exception: dizziness caused by heat exposure, sudden standing, or poor fluid intake)  Answer Assessment - Initial Assessment Questions 1. DESCRIPTION: "Describe your dizziness."     "Feel like I'm going to pass out when I first stand up, lasts few seconds. Will happen if standing for a while." States occurred twice today, more frequent over the w/e.  2. LIGHTHEADED: "Do you feel lightheaded?" (e.g., somewhat faint, woozy, weak upon standing)     Lightheaded; "Room not spinning." 3. VERTIGO: "Do you feel like either you or the room is spinning or tilting?" (i.e. vertigo)    No 4. SEVERITY: "How bad is it?"  "Do you feel like you are going to faint?" "Can you stand and walk?"   - MILD - walking normally   - MODERATE - interferes with normal activities (e.g., work, school)    - SEVERE - unable to stand, requires support to walk, feels like passing out now.     moderate 5. ONSET:  "When did the dizziness begin?"     01/06/18 6. AGGRAVATING FACTORS: "Does  anything make it worse?" (e.g., standing, change in head position)     Sitting to standing 7. HEART RATE: "Can you tell me your heart rate?" "How many beats in 15 seconds?"  (Note: not all patients can do this)        HR 80 8. CAUSE: "What do you think is causing the dizziness?"     Unsure  9. RECURRENT SYMPTOM: "Have you had dizziness before?" If so, ask:   "When was the last time?" "What happened that time?"    " Last year. Not sure what it is." 10. OTHER SYMPTOMS: "Do you have any other symptoms?" (e.g., fever, chest pain, vomiting, diarrhea, bleeding)       Nausea intermittent, achy, no appetite. 11. PREGNANCY: "Is there any chance you are pregnant?" "When was your last menstrual period?"       No; 3 weeks ago  Protocols used: DIZZ[facility]- LIGHTHEADEDNESS-A-AH

## 2018-01-11 ENCOUNTER — Ambulatory Visit: Payer: BLUE CROSS/BLUE SHIELD | Admitting: Family Medicine

## 2018-01-11 ENCOUNTER — Ambulatory Visit: Payer: Self-pay

## 2018-01-11 ENCOUNTER — Encounter: Payer: Self-pay | Admitting: Family Medicine

## 2018-01-11 ENCOUNTER — Encounter (HOSPITAL_COMMUNITY): Payer: Self-pay | Admitting: Emergency Medicine

## 2018-01-11 ENCOUNTER — Emergency Department (HOSPITAL_COMMUNITY)
Admission: EM | Admit: 2018-01-11 | Discharge: 2018-01-12 | Disposition: A | Discharge status: Home / Self Care | Payer: BLUE CROSS/BLUE SHIELD | Attending: Emergency Medicine | Admitting: Emergency Medicine

## 2018-01-11 VITALS — BP 118/64 | HR 82 | Temp 98.1°F | Ht 64.0 in | Wt 128.0 lb

## 2018-01-11 DIAGNOSIS — Z793 Long term (current) use of hormonal contraceptives: Secondary | ICD-10-CM | POA: Insufficient documentation

## 2018-01-11 DIAGNOSIS — R42 Dizziness and giddiness: Secondary | ICD-10-CM

## 2018-01-11 DIAGNOSIS — E162 Hypoglycemia, unspecified: Secondary | ICD-10-CM | POA: Diagnosis not present

## 2018-01-11 DIAGNOSIS — R55 Syncope and collapse: Secondary | ICD-10-CM | POA: Insufficient documentation

## 2018-01-11 DIAGNOSIS — I10 Essential (primary) hypertension: Secondary | ICD-10-CM | POA: Diagnosis not present

## 2018-01-11 DIAGNOSIS — Z79899 Other long term (current) drug therapy: Secondary | ICD-10-CM | POA: Insufficient documentation

## 2018-01-11 LAB — CBG MONITORING, ED
GLUCOSE-CAPILLARY: 44 mg/dL — AB (ref 65–99)
Glucose-Capillary: 68 mg/dL (ref 65–99)

## 2018-01-11 LAB — URINALYSIS, ROUTINE W REFLEX MICROSCOPIC
Bacteria, UA: NONE SEEN
Bilirubin Urine: NEGATIVE
Glucose, UA: NEGATIVE mg/dL
Hgb urine dipstick: NEGATIVE
Ketones, ur: 5 mg/dL — AB
Leukocytes, UA: NEGATIVE
Nitrite: NEGATIVE
Protein, ur: 100 mg/dL — AB
Specific Gravity, Urine: 1.034 — ABNORMAL HIGH (ref 1.005–1.030)
pH: 5 (ref 5.0–8.0)

## 2018-01-11 LAB — I-STAT BETA HCG BLOOD, ED (MC, WL, AP ONLY): I-stat hCG, quantitative: <5 m[IU]/mL

## 2018-01-11 MED ORDER — ONDANSETRON 4 MG PO TBDP
4.0000 mg | ORAL_TABLET | Freq: Once | ORAL | Status: AC | PRN
  Administered 2018-01-11: 4 mg via ORAL
  Filled 2018-01-11: qty 1

## 2018-01-11 NOTE — ED Triage Notes (Addendum)
Patient reports standing up in her room to shut the window and "fainting." Reports "my roommates helped me up." Denies head injury, neck and back pain. Denies chest pain and SOB. Ambulatory. Seen at PCP today.

## 2018-01-11 NOTE — ED Notes (Signed)
Patient given orange juice and graham crackers.

## 2018-01-11 NOTE — ED Notes (Signed)
I attempted to collect labs and I was unsuccessful

## 2018-01-11 NOTE — Progress Notes (Signed)
Stacey Holland - 22 y.o. female MRN [id number]  Date of birth: May 19, 1996  SUBJECTIVE:  Including CC & ROS.  Chief Complaint  Patient presents with  . Dizziness    Stacey Holland is a 22 y.o. female that is presenting with lightheadedness. Ongoing for two weeks. Denies fevers. Admits to body aches. Admits to generalized abdominal pain, denies any localized pain. Denies any food triggers. She has been vomiting intermittently. She is unable to tolerate foods. She has had symptoms like this in the past. She denies of any chance of being pregnant. She reports taking birth control. She feels like her symptoms are worse when she is transitioning from sitting to standing. She does have trouble sleeping and wakes up periodically throughout the night. She is under stress but nothing more than usual. She's not had any changes on medications. She does take a medication for ADHD.   Review of Systems  Constitutional: Negative for fever.  Respiratory: Negative for cough.   Cardiovascular: Negative for chest pain.  Gastrointestinal: Negative for abdominal pain.  Musculoskeletal: Negative for back pain.  Neurological: Positive for light-headedness.  Psychiatric/Behavioral: Negative for agitation.    HISTORY: Past Medical, Surgical, Social, and Family History Reviewed & Updated per EMR.   Pertinent Historical Findings include:  Past Medical History:  Diagnosis Date  . Dyspnea    breathing heavyeven when sitting, lying dpwn,exertion  . GERD (gastroesophageal reflux disease)   . Headache   . History of blood transfusion    prematurity, this was during perinatal period  . Hypertension   . Morbid obesity (HCC) 04/03/2016  . PCOS (polycystic ovarian syndrome) 04/03/2016  . Reflux     Past Surgical History:  Procedure Laterality Date  . CHOLECYSTECTOMY N/A 11/25/2016   Procedure: LAPAROSCOPIC CHOLECYSTECTOMY;  Surgeon: Axel Filler, MD;  Location: [facility] OR;  Service: General;  Laterality: N/A;  .  ESOPHAGOGASTRODUODENOSCOPY N/A 09/26/2016   Procedure: ESOPHAGOGASTRODUODENOSCOPY (EGD);  Surgeon: Beverley Fiedler, MD;  Location: [facility];  Service: Endoscopy;  Laterality: N/A;  . eustachion tubes      Allergies  Allergen Reactions  . Pineapple Other (See Comments)    Reaction:  Burning   . Other Itching, Swelling, Rash and Other (See Comments)    Reaction:  Burning Pt states that she is allergic to all hair dye.      Family History  Problem Relation Age of Onset  . GER disease Mother   . Hypertension Mother   . GER disease Father   . Hypertension Father   . Lactose intolerance Brother   . Hypertension Maternal Grandfather   . Cancer Neg Hx   . Depression Neg Hx   . Drug abuse Neg Hx   . Early death Neg Hx   . Heart disease Neg Hx   . Hyperlipidemia Neg Hx   . Kidney disease Neg Hx   . Alcohol abuse Neg Hx   . Asthma Neg Hx   . Stroke Neg Hx      Social History   Socioeconomic History  . Marital status: Single    Spouse name: Not on file  . Number of children: Not on file  . Years of education: Not on file  . Highest education level: Not on file  Social Needs  . Financial resource strain: Not on file  . Food insecurity - worry: Not on file  . Food insecurity - inability: Not on file  . Transportation needs - medical: Not on file  . Transportation needs -  non-medical: Not on file  Occupational History  . Not on file  Tobacco Use  . Smoking status: Never Smoker  . Smokeless tobacco: Never Used  Substance and Sexual Activity  . Alcohol use: No  . Drug use: No  . Sexual activity: No    Birth control/protection: Pill, Abstinence  Other Topics Concern  . Not on file  Social History Narrative   Studying mass communications at [employer]     PHYSICAL EXAM:  VS: BP 118/64 (BP Location: Left Arm, Patient Position: Sitting, Cuff Size: Normal)   Pulse 82   Temp 98.1 F (36.7 C) (Oral)   Ht 5\' 4"  (1.626 m)   Wt 128 lb (58.1 kg)   LMP 12/23/2017   SpO2 100%    BMI 21.97 kg/m  Physical Exam Gen: NAD, alert, cooperative with exam, ENT: normal lips, normal nasal mucosa,  Eye: normal EOM, normal conjunctiva and lids CV:  no edema, +2 pedal pulses, S1-S2, regular in rhythm  Resp: no accessory muscle use, non-labored, clear to auscultation bilaterally  Skin: no rashes, no areas of induration  Neuro: normal tone, normal sensation to touch Psych:  normal insight, alert and oriented MSK: Normal gait, normal strength      ASSESSMENT & PLAN:   Lightheadedness She will have a viral component as to the origin of her lightheadedness. Adamantly denies any chance of being pregnant. - Counseled on supportive measures - If no improvement may need to evaluate with urine pregnancy, referral to cardiology, or vestibular ocular rehabilitation.

## 2018-01-11 NOTE — Patient Instructions (Signed)
Please try to drink plenty of water.  Please try to wear compression on your lower legs.  Please move slowly rising from a seated position or bending over.  Please increase your salt intake.   Please follow up with me in 1 week if your symptoms are not improved.    Orthostatic Hypotension Orthostatic hypotension is a sudden drop in blood pressure that happens when you quickly change positions, such as when you get up from a seated or lying position. Blood pressure is a measurement of how strongly, or weakly, your blood is pressing against the walls of your arteries. Arteries are blood vessels that carry blood from your heart throughout your body. When blood pressure is too low, you may not get enough blood to your brain or to the rest of your organs. This can cause weakness, light-headedness, rapid heartbeat, and fainting. This can last for just a few seconds or for up to a few minutes. Orthostatic hypotension is usually not a serious problem. However, if it happens frequently or gets worse, it may be a sign of something more serious. What are the causes? This condition may be caused by:  Sudden changes in posture, such as standing up quickly after you have been sitting or lying down.  Blood loss.  Loss of body fluids (dehydration).  Heart problems.  Hormone (endocrine) problems.  Pregnancy.  Severe infection.  Lack of certain nutrients.  Severe allergic reactions (anaphylaxis).  Certain medicines, such as blood pressure medicine or medicines that make the body lose excess fluids (diuretics). Sometimes, this condition can be caused by not taking medicine as directed, such as taking too much of a certain medicine.  What increases the risk? Certain factors can make you more likely to develop orthostatic hypotension, including:  Age. Risk increases as you get older.  Conditions that affect the heart or the central nervous system.  Taking certain medicines, such as blood pressure  medicine or diuretics.  Being pregnant.  What are the signs or symptoms? Symptoms of this condition may include:  Weakness.  Light-headedness.  Dizziness.  Blurred vision.  Fatigue.  Rapid heartbeat.  Fainting, in severe cases.  How is this diagnosed? This condition is diagnosed based on:  Your medical history.  Your symptoms.  Your blood pressure measurement. Your health care provider will check your blood pressure when you are: ? Lying down. ? Sitting. ? Standing.  A blood pressure reading is recorded as two numbers, such as "120 over 80" (or 120/80). The first ("top") number is called the systolic pressure. It is a measure of the pressure in your arteries as your heart beats. The second ("bottom") number is called the diastolic pressure. It is a measure of the pressure in your arteries when your heart relaxes between beats. Blood pressure is measured in a unit called mm Hg. Healthy blood pressure for adults is 120/80. If your blood pressure is below 90/60, you may be diagnosed with hypotension. Other information or tests that may be used to diagnose orthostatic hypotension include:  Your other vital signs, such as your heart rate and temperature.  Blood tests.  Tilt table test. For this test, you will be safely secured to a table that moves you from a lying position to an upright position. Your heart rhythm and blood pressure will be monitored during the test.  How is this treated? Treatment for this condition may include:  Changing your diet. This may involve eating more salt (sodium) or drinking more water.  Taking  medicines to raise your blood pressure.  Changing the dosage of certain medicines you are taking that might be lowering your blood pressure.  Wearing compression stockings. These stockings help to prevent blood clots and reduce swelling in your legs.  In some cases, you may need to go to the hospital for:  Fluid replacement. This means you will  receive fluids through an IV tube.  Blood replacement. This means you will receive donated blood through an IV tube (transfusion).  Treating an infection or heart problems, if this applies.  Monitoring. You may need to be monitored while medicines that you are taking wear off.  Follow these instructions at home: Eating and drinking   Drink enough fluid to keep your urine clear or pale yellow.  Eat a healthy diet and follow instructions from your health care provider about eating or drinking restrictions. A healthy diet includes: ? Fresh fruits and vegetables. ? Whole grains. ? Lean meats. ? Low-fat dairy products.  Eat extra salt only as directed. Do not add extra salt to your diet unless your health care provider told you to do that.  Eat frequent, small meals.  Avoid standing up suddenly after eating. Medicines  Take over-the-counter and prescription medicines only as told by your health care provider. ? Follow instructions from your health care provider about changing the dosage of your current medicines, if this applies. ? Do not stop or adjust any of your medicines on your own. General instructions  Wear compression stockings as told by your health care provider.  Get up slowly from lying down or sitting positions. This gives your blood pressure a chance to adjust.  Avoid hot showers and excessive heat as directed by your health care provider.  Return to your normal activities as told by your health care provider. Ask your health care provider what activities are safe for you.  Do not use any products that contain nicotine or tobacco, such as cigarettes and e-cigarettes. If you need help quitting, ask your health care provider.  Keep all follow-up visits as told by your health care provider. This is important. Contact a health care provider if:  You vomit.  You have diarrhea.  You have a fever for more than 2-3 days.  You feel more thirsty than usual.  You  feel weak and tired. Get help right away if:  You have chest pain.  You have a fast or irregular heartbeat.  You develop numbness in any part of your body.  You cannot move your arms or your legs.  You have trouble speaking.  You become sweaty or feel lightheaded.  You faint.  You feel short of breath.  You have trouble staying awake.  You feel confused. This information is not intended to replace advice given to you by your health care provider. Make sure you discuss any questions you have with your health care provider. Document Released: 10/16/2002 Document Revised: 07/14/2016 Document Reviewed: 04/17/2016 Elsevier Interactive Patient Education  2018 [employer].

## 2018-01-11 NOTE — Telephone Encounter (Signed)
Patient's called in with c/o "fainting." She said "I saw the doctor today and he said I probably have a virus. I was lying on the floor, I took a drink, then got up to pull the blind down. As I was reaching for the blind, I could feel my body shaking and the next thing I knew I was waking up on the floor. My friends caught me, so I didn't hit my head. They said I was shaking as I was going down and was out for about 20-30 seconds. I still feel a little hazy, my body aches, I feel tired, which has been going on for about 2 weeks. I was a little disoriented when I woke up, but I knew who I was and where I was." According to protocol, go to ED. I advised to go to ED due to fainting and still feeling "hazy." Care advice given, she verbalized understanding and says "I'll go and have someone to drive me."  Reason for Disposition . [1] Fainted > 15 minutes ago AND [2] still feels weak or dizzy  Answer Assessment - Initial Assessment Questions 1. ONSET: "How long were you unconscious?" (minutes) "When did it happen?"     Today; 20-30 seconds 2. CONTENT: "What happened during period of unconsciousness?" (e.g., seizure activity)      Shaking before I hit the ground  3. MENTAL STATUS: "Alert and oriented now?" (oriented x 3 = name, month, location)      Alert and oriented x 3 4. TRIGGER: "What do you think caused the fainting?" "What were you doing just before you fainted?"  (e.g., exercise, sudden standing up, prolonged standing)     Laying down on the floor, got up to close the blind, reached for it but couldn't find it, then was on the ground 5. RECURRENT SYMPTOM: "Have you ever passed out before?" If so, ask: "When was the last time?" and "What happened that time?"     No 6. INJURY: "Did you sustain any injury during the fall?"      No 7. CARDIAC SYMPTOMS: "Have you had any of the following symptoms: chest pain, difficulty breathing, palpitations?"    6 mos ago, but lately no 8. NEUROLOGIC SYMPTOMS:  "Have you had any of the following symptoms: headache, numbness, vertigo, weakness?"     Yes to headache, weakness 9. GI SYMPTOMS: "Have you had any of the following symptoms: abdominal pain, vomiting, diarrhea, blood in stools?"     Abdominal pain-not bad just tender 10. OTHER SYMPTOMS: "Do you have any other symptoms?"       Right now feel hazy; past weeks feel tired, achy 11. PREGNANCY: "Is there any chance you are pregnant?" "When was your last menstrual period?"       No-LMP 2 weeks ago  Protocols [facility]: [facility]

## 2018-01-12 ENCOUNTER — Emergency Department (HOSPITAL_COMMUNITY): Payer: BLUE CROSS/BLUE SHIELD

## 2018-01-12 DIAGNOSIS — R42 Dizziness and giddiness: Secondary | ICD-10-CM | POA: Insufficient documentation

## 2018-01-12 LAB — CBC
HCT: 39 % (ref 36.0–46.0)
Hemoglobin: 13.1 g/dL (ref 12.0–15.0)
MCH: 27.7 pg (ref 26.0–34.0)
MCHC: 33.6 g/dL (ref 30.0–36.0)
MCV: 82.5 fL (ref 78.0–100.0)
PLATELETS: 295 10*3/uL (ref 150–400)
RBC: 4.73 MIL/uL (ref 3.87–5.11)
RDW: 13 % (ref 11.5–15.5)
WBC: 6.5 10*3/uL (ref 4.0–10.5)

## 2018-01-12 LAB — BASIC METABOLIC PANEL
Anion gap: 11 (ref 5–15)
BUN: 12 mg/dL (ref 6–20)
CALCIUM: 9.4 mg/dL (ref 8.9–10.3)
CO2: 30 mmol/L (ref 22–32)
CREATININE: 0.95 mg/dL (ref 0.44–1.00)
Chloride: 99 mmol/L — ABNORMAL LOW (ref 101–111)
Glucose, Bld: 80 mg/dL (ref 65–99)
Potassium: 3.2 mmol/L — ABNORMAL LOW (ref 3.5–5.1)
SODIUM: 140 mmol/L (ref 135–145)

## 2018-01-12 LAB — CBG MONITORING, ED
GLUCOSE-CAPILLARY: 66 mg/dL (ref 65–99)
GLUCOSE-CAPILLARY: 70 mg/dL (ref 65–99)

## 2018-01-12 NOTE — ED Provider Notes (Signed)
[facility]-EMERGENCY DEPT Provider Note   CSN: [id number] Arrival date & time: 01/11/18  1736     History   Chief Complaint Chief Complaint  Patient presents with  . Loss of Consciousness    HPI Stacey Holland is a 22 y.o. female.  Patient is a 22 year old female with history of hypertension.  This was treated with lisinopril which caused renal failure 2 years ago.  Her kidneys have since recovered and her creatinine is normal.  She presents today for evaluation of syncope.  She states she got up from the sofa to close the window.  While standing at the window, she was said to have lost consciousness.  This was witnessed by her friend at bedside.  Her friend states that she "fell slowly backward" onto the floor.  She witnessed tonic-clonic like movement of her hands.  This episode lasted approximately 20 seconds, then resolved.  She did appear somewhat confused and disoriented afterward, however this did not last long.  She now feels better.  She denies any heart racing or palpitation.  She does report that she has felt "not well" for the past 2 weeks.  She states that she has been weak and felt dizzy when standing.  She denies any new diets or other recent illnesses.    Loss of Consciousness   This is a new problem. The current episode started less than 1 hour ago. The problem occurs constantly. The problem has been resolved. She lost consciousness for a period of less than one minute. Pertinent negatives include chest pain, fever, headaches and palpitations. She has tried nothing for the symptoms.    Past Medical History:  Diagnosis Date  . Dyspnea    breathing heavyeven when sitting, lying dpwn,exertion  . GERD (gastroesophageal reflux disease)   . Headache   . History of blood transfusion    prematurity, this was during perinatal period  . Hypertension   . Morbid obesity (HCC) 04/03/2016  . PCOS (polycystic ovarian syndrome) 04/03/2016  . Reflux      Patient Active Problem List   Diagnosis Date Noted  . Eating disorder 12/01/2017  . Attention and concentration deficit 12/01/2017  . Chronic idiopathic constipation 12/01/2017  . Vomiting associated with bulimia nervosa with nausea 06/22/2017  . Hypomagnesemia 09/25/2016  . Hypokalemia 09/18/2016  . Routine general medical examination at a health care facility 08/26/2016  . Atypical lymphocytosis 08/25/2016  . PCOS (polycystic ovarian syndrome) 04/03/2016  . Essential hypertension 02/20/2015  . GE reflux     Past Surgical History:  Procedure Laterality Date  . CHOLECYSTECTOMY N/A 11/25/2016   Procedure: LAPAROSCOPIC CHOLECYSTECTOMY;  Surgeon: Axel FillerArmando Ramirez, MD;  [facility];  Service: General;  Laterality: N/A;  . ESOPHAGOGASTRODUODENOSCOPY N/A 09/26/2016   Procedure: ESOPHAGOGASTRODUODENOSCOPY (EGD);  Surgeon: Beverley FiedlerJay M Pyrtle, MD;  [facility];  Service: Endoscopy;  Laterality: N/A;  . eustachion tubes      OB History    No data available       Home Medications    Prior to Admission medications   Medication Sig Start Date End Date Taking? Authorizing Provider  Biotin 1000 MCG tablet Take 1,000 mcg by mouth 3 (three) times daily.    [provider]  citalopram (CELEXA) 20 MG tablet Take 1 tablet (20 mg total) by mouth daily. 12/01/17   Etta GrandchildJones, Thomas L, MD  Dietary Management Product (VAYARIN PLUS) CAPS Take 1 capsule by mouth 2 (two) times daily. 12/14/17   Etta GrandchildJones, Thomas L,  MD  linaclotide (LINZESS) 72 MCG capsule Take 1 capsule (72 mcg total) by mouth daily before breakfast. 12/01/17   Etta Grandchild, MD  lisdexamfetamine (VYVANSE) 20 MG capsule Take 1 capsule (20 mg total) by mouth daily. 12/28/17   Etta Grandchild, MD  Multiple Vitamin (MULTIVITAMIN) tablet Take 1 tablet by mouth daily.    [provider]  Norgestimate-Ethinyl Estradiol Triphasic (TRI-PREVIFEM) 0.18/0.215/0.25 MG-35 MCG tablet Take 1 tablet by mouth at bedtime.    [provider]  Omega-3 Fatty Acids (OMEGA-3 PLUS PO) Take by mouth.    [provider]  omeprazole (PRILOSEC) 20 MG capsule TAKE 1 CAPSULE (20 MG TOTAL) BY MOUTH DAILY. 04/19/17   Etta Grandchild, MD  potassium chloride (KLOR-CON) 20 MEQ packet Take 20 mEq by mouth daily. 12/28/17   Etta Grandchild, MD    Family History Family History  Problem Relation Age of Onset  . GER disease Mother   . Hypertension Mother   . GER disease Father   . Hypertension Father   . Lactose intolerance Brother   . Hypertension Maternal Grandfather   . Cancer Neg Hx   . Depression Neg Hx   . Drug abuse Neg Hx   . Early death Neg Hx   . Heart disease Neg Hx   . Hyperlipidemia Neg Hx   . Kidney disease Neg Hx   . Alcohol abuse Neg Hx   . Asthma Neg Hx   . Stroke Neg Hx     Social History Social History   Tobacco Use  . Smoking status: Never Smoker  . Smokeless tobacco: Never Used  Substance Use Topics  . Alcohol use: No  . Drug use: No     Allergies   Pineapple and Other   Review of Systems Review of Systems  Constitutional: Negative for fever.  Cardiovascular: Positive for syncope. Negative for chest pain and palpitations.  Neurological: Negative for headaches.  All other systems reviewed and are negative.    Physical Exam Updated Vital Signs BP 125/80 (BP Location: Left Arm)   Pulse 85   Temp 98.9 F (37.2 C) (Oral)   Resp 18   LMP 12/23/2017   SpO2 100%   Physical Exam  Constitutional: She is oriented to person, place, and time. She appears well-developed and well-nourished. No distress.  HENT:  Head: Normocephalic and atraumatic.  Eyes: EOM are normal. Pupils are equal, round, and reactive to light.  Neck: Normal range of motion. Neck supple.  Cardiovascular: Normal rate and regular rhythm. Exam reveals no gallop and no friction rub.  No murmur heard. Pulmonary/Chest: Effort normal and breath sounds normal. No respiratory distress. She has no wheezes.    Abdominal: Soft. Bowel sounds are normal. She exhibits no distension. There is no tenderness.  Musculoskeletal: Normal range of motion.  Neurological: She is alert and oriented to person, place, and time. No cranial nerve deficit. She exhibits normal muscle tone. Coordination normal.  Skin: Skin is warm and dry. She is not diaphoretic.  Nursing note and vitals reviewed.    ED Treatments / Results  Labs (all labs ordered are listed, but only abnormal results are displayed) Labs Reviewed  URINALYSIS, ROUTINE W REFLEX MICROSCOPIC - Abnormal; Notable for the following components:      Result Value   Specific Gravity, Urine 1.034 (*)    Ketones, ur 5 (*)    Protein, ur 100 (*)    Squamous Epithelial / LPF 0-5 (*)    All  other components within normal limits  CBG MONITORING, ED - Abnormal; Notable for the following components:   Glucose-Capillary 44 (*)    All other components within normal limits  BASIC METABOLIC PANEL  CBC  I-STAT BETA HCG BLOOD, ED ([facility], [facility], [facility] ONLY)  CBG MONITORING, ED    EKG  EKG Interpretation  Date/Time:  Tuesday January 11 2018 18:00:34 EST Ventricular Rate:  100 PR Interval:    QRS Duration: 86 QT Interval:  315 QTC Calculation: 407 R Axis:   78 Text Interpretation:  Sinus tachycardia Baseline wander in lead(s) I II aVR aVL V5 Confirmed by Geoffery Lyons ([id number]) on 01/12/2018 12:28:27 AM       Radiology No results found.  Procedures Procedures (including critical care time)  Medications Ordered in ED Medications  ondansetron (ZOFRAN-ODT) disintegrating tablet 4 mg (4 mg Oral Given 01/11/18 2104)     Initial Impression / Assessment and Plan / ED Course  I have reviewed the triage vital signs and the nursing notes.  Pertinent labs & imaging results that were available during my care of the patient were reviewed by me and considered in my medical decision making (see chart for details).  Patient presents after a syncopal episode at home.  She has  been feeling dizzy and weak for the past 2 weeks.  She does report some nausea and vomiting over the past 2 days.  I am uncertain as to the etiology of the syncopal episode, however I suspect vasovagal.  Her workup is unremarkable including laboratory studies, head CT, EKG.  She appears stable and appropriate for discharge.  Also of note is that her initial blood sugar upon presentation was 44.  It is possible that she may be experiencing hypoglycemic episodes.  I have advised her to try eating or drinking something sweet the next time this occurs and see if her symptoms resolve.  She is to follow-up with her primary doctor next week for a recheck, and return as needed for any problems.  Final Clinical Impressions(s) / ED Diagnoses   Final diagnoses:  None    ED Discharge Orders    None       Geoffery Lyons, MD 01/12/18 [phone]

## 2018-01-12 NOTE — Assessment & Plan Note (Signed)
She will have a viral component as to the origin of her lightheadedness. Adamantly denies any chance of being pregnant. - Counseled on supportive measures - If no improvement may need to evaluate with urine pregnancy, referral to cardiology, or vestibular ocular rehabilitation.

## 2018-01-12 NOTE — Discharge Instructions (Signed)
Follow-up with your primary doctor next week for a recheck, and return to the ER if symptoms significantly worsen or change in the meantime.

## 2018-01-14 ENCOUNTER — Ambulatory Visit: Payer: Self-pay | Admitting: *Deleted

## 2018-01-14 DIAGNOSIS — G4733 Obstructive sleep apnea (adult) (pediatric): Secondary | ICD-10-CM | POA: Insufficient documentation

## 2018-01-14 DIAGNOSIS — F909 Attention-deficit hyperactivity disorder, unspecified type: Secondary | ICD-10-CM | POA: Insufficient documentation

## 2018-01-14 DIAGNOSIS — E559 Vitamin D deficiency, unspecified: Secondary | ICD-10-CM | POA: Insufficient documentation

## 2018-01-14 DIAGNOSIS — K219 Gastro-esophageal reflux disease without esophagitis: Secondary | ICD-10-CM | POA: Insufficient documentation

## 2018-01-14 DIAGNOSIS — F509 Eating disorder, unspecified: Secondary | ICD-10-CM | POA: Insufficient documentation

## 2018-01-14 HISTORY — DX: Obstructive sleep apnea (adult) (pediatric): G47.33

## 2018-01-14 NOTE — Telephone Encounter (Signed)
Pt called with c/o not able to urinate. She states the last time she urinated was around 5pm yesterday. She is having some flank pain and some vaginal. Questions maybe it is the medication she is on is causing this. I went over her medication with her and newest ones do not cause retention. Pt states she is only taking sips of water when she drinks. She states she only drinks a little when she drinks.   I feel like she is not drinking enough fluids. Advised her to sit in a tub of warm water or put her hand in warm water to see if that could get her started.  Also advised her to to to urgent care to be cathed. Pt voiced understanding. She already has an appointment made for Monday with her provider.   Reason for Disposition . [1] Unable to urinate (or only a few drops) > 4 hours AND     [2] bladder feels very full (e.g., palpable bladder or strong urge to urinate)  Answer Assessment - Initial Assessment Questions 1. SEVERITY: "How bad is the pain?"  (e.g., Scale 1-10; mild, moderate, or severe)   - MILD (1-3): complains slightly about urination hurting   - MODERATE (4-7): interferes with normal activities     - SEVERE (8-10): excruciating, unwilling or unable to urinate because of the pain      #4 2. FREQUENCY: "How many times have you had painful urination today?"      Yes, a few years ago 3. PATTERN: "Is pain present every time you urinate or just sometimes?"      Tenderness every time she tries to urinate 4. ONSET: "When did the painful urination start?"      Started this morning 5. FEVER: "Do you have a fever?" If so, ask: "What is your temperature, how was it measured, and when did it start?"     no 6. PAST UTI: "Have you had a urine infection before?" If so, ask: "When was the last time?" and "What happened that time?"      no 7. CAUSE: "What do you think is causing the painful urination?"  (e.g., UTI, scratch, Herpes sore)     Not sure 8. OTHER SYMPTOMS: "Do you have any other  symptoms?" (e.g., flank pain, vaginal discharge, genital sores, urgency, blood in urine)     Flank pain, vaginal discharge (white color) 9. PREGNANCY: "Is there any chance you are pregnant?" "When was your last menstrual period?"     No, LMP  2.5 weeks ago  Protocols used: URINATION PAIN - FEMALE-A-AH

## 2018-01-14 NOTE — Telephone Encounter (Signed)
Pt called back and stated that she had put her hand in warm water and she was able to go. So she is not going to urgent care. Pt advised to keep her appointment on Monday with her pcp. She voiced understanding.

## 2018-01-17 ENCOUNTER — Ambulatory Visit: Payer: BLUE CROSS/BLUE SHIELD | Admitting: Internal Medicine

## 2018-01-17 ENCOUNTER — Other Ambulatory Visit (INDEPENDENT_AMBULATORY_CARE_PROVIDER_SITE_OTHER): Payer: BLUE CROSS/BLUE SHIELD

## 2018-01-17 ENCOUNTER — Encounter: Payer: Self-pay | Admitting: Internal Medicine

## 2018-01-17 VITALS — BP 110/82 | HR 107 | Temp 98.4°F | Ht 64.0 in | Wt 127.1 lb

## 2018-01-17 DIAGNOSIS — F509 Eating disorder, unspecified: Secondary | ICD-10-CM

## 2018-01-17 DIAGNOSIS — E876 Hypokalemia: Secondary | ICD-10-CM | POA: Diagnosis not present

## 2018-01-17 LAB — BASIC METABOLIC PANEL
BUN: 13 mg/dL (ref 6–23)
CHLORIDE: 96 meq/L (ref 96–112)
CO2: 33 mEq/L — ABNORMAL HIGH (ref 19–32)
CREATININE: 0.91 mg/dL (ref 0.40–1.20)
Calcium: 9.9 mg/dL (ref 8.4–10.5)
GFR: 82.07 mL/min (ref >60.00)
GLUCOSE: 74 mg/dL (ref 70–99)
Potassium: 3.1 mEq/L — ABNORMAL LOW (ref 3.5–5.1)
Sodium: 138 mEq/L (ref 135–145)

## 2018-01-17 MED ORDER — POTASSIUM CHLORIDE CRYS ER 20 MEQ PO TBCR: 20.0000 meq | EXTENDED_RELEASE_TABLET | Freq: Three times a day (TID) | ORAL | 3 refills | Status: DC

## 2018-01-17 NOTE — Patient Instructions (Signed)
Anorexia Nervosa Anorexia nervosa is an eating disorder that results in a lower-than-normal body weight. The disorder usually starts in the teenage years. People with anorexia nervosa are intensely afraid of gaining weight or being fat. They often see themselves as fat even though they may be very thin. In order to prevent weight gain or to lose weight, they engage in unhealthy behaviors, such as:  Starving themselves.  Fasting.  Exercising too much.  Making themselves throw up (vomit) after eating.  These behaviors often interfere with normal life activities. They can lead to serious medical problems and even death. People with anorexia nervosa are at risk for substance abuse and death due to suicide. What are the causes? The cause is unknown, but the disorder probably results from a combination of factors, such as:  Depression.  Other psychological problems.  Peer pressure.  Hormone changes at puberty.  Stress.  What increases the risk? Risk factors include:  Being a teenager.  Being female. Anorexia nervosa can affect males, but it is more common in females.  Having a mental health disorder, such as depression or anxiety.  Having a family member with anorexia nervosa.  What are the signs or symptoms? Signs and symptoms include:  Lower-than-normal body weight.  An intense fear of gaining weight or being fat.  Having a distorted body image.  Basing self-worth on being thin.  Wearing lots of clothes to hide your body.  Being in denial that your low body weight is a problem.  Eating in secret.  Binge eating.  Checking your body several times a day by: ? Looking in a mirror. ? Pinching the skin on your sides. ? Weighing yourself.  Doing things that prevent weight gain, such as: ? Restricting your calorie intake. This may be done by starving yourself, fasting, or exercising too much. ? Making yourself vomit. ? Using too many laxatives. ? Using water pills  inappropriately.  How is this diagnosed? To make a diagnosis, your health care provider will:  Ask about: ? Your thoughts, feelings, and eating habits. ? Your symptoms. ? Your use of medicine, alcohol, or other substances.  Measure your weight and check your vital signs.  Perform a physical exam.  Your health care provider may also order tests or studies to look for health problems. You may be referred to a mental health specialist for evaluation. Once you have been diagnosed, your level of anorexia nervosa will be rated from mild to severe. The rating depends on your degree of weight loss compared to other people of your age, gender, and stage of development. How is this treated? The first goal of treatment is to stabilize medical problems and mental health issues related to the disorder. The type and length of treatment will depend on your specific situation. You may need to be treated at the hospital if you have:  Severe malnutrition.  Dehydration.  An electrolyte imbalance.  An abnormal heart rhythm.  Depression.  Suicidal thoughts.  The second goal of treatment is to restore your body weight to a healthy level. Successful treatment usually requires a combination of:  Counseling with a dietitian.  Counseling or talk therapy with a mental health specialist.A form of talk therapy called cognitive behavioral therapy can be especially helpful. This therapy helps you recognize the thoughts, beliefs, and emotions that lead to unhealthy eating habits and helps you change them.  Medicine. Some people with severe anorexia nervosa may benefit from certain medicines that promote weight gain. Antidepressant medicines can  help with symptoms of depression and anxiety.  Follow these instructions at home:  Keep all follow-up visits as directed by your health care provider. This is important.  Follow a meal plan as directed by your health care provider.  Avoid checking your  weight.  Avoid isolating yourself from your family and friends.  Take medicines only as directed by your health care provider. Where to find more information: National Eating Disorders Association (NEDA): www.nationaleatingdisorders.org Contact a health care provider if:  Your symptoms get worse.  You start having new symptoms. Get help right away if:  You have serious thoughts about hurting yourself or someone else.  You collapse.  You have chest pain.  You vomit blood. This information is not intended to replace advice given to you by your health care provider. Make sure you discuss any questions you have with your health care provider. Document Released: 10/23/2000 Document Revised: 04/02/2016 Document Reviewed: 02/22/2014 Elsevier Interactive Patient Education  [employer]2018 [employer] Inc[employer]

## 2018-01-17 NOTE — Progress Notes (Signed)
Subjective:  Patient ID: Stacey Holland, female    DOB: August 21, 1996  Age: 22 y.o. MRN: [id number]  CC: Abnormal Lab   HPI Stacey Holland presents for f/up -she was seen in the ED about 6 days ago for near syncopal episode.  She was found to have a low blood sugar and a low potassium level.  She tells me she is doing better with respect to her eating disorder but continues to struggle with anorexia nervosa as well as bulimia.  It also sounds like she has not been taking the potassium supplement recently because she noticed that the matrix of the tablet that the potassium was dissolved and is coming out in her stool and that concerns her.  She complains of persistent episodes of nausea, feeling dizzy, and lightheaded.  She also does not think she is producing much urine.  Today she tells me she has eaten a muffin and some cheezits.  She is also had some water, juice, and an energy drink.  She denies abdominal pain, diarrhea, or vomiting.  She continues to complain of constipation.  She has not had a syncopal episode over the last 5 days.  Outpatient Medications Prior to Visit  Medication Sig Dispense Refill  . Biotin 1000 MCG tablet Take 1,000 mcg by mouth 3 (three) times daily.    . citalopram (CELEXA) 20 MG tablet Take 1 tablet (20 mg total) by mouth daily. 90 tablet 1  . Dietary Management Product (VAYARIN PLUS) CAPS Take 1 capsule by mouth 2 (two) times daily. 180 capsule 1  . linaclotide (LINZESS) 72 MCG capsule Take 1 capsule (72 mcg total) by mouth daily before breakfast. 90 capsule 1  . lisdexamfetamine (VYVANSE) 20 MG capsule Take 1 capsule (20 mg total) by mouth daily. 30 capsule 0  . Multiple Vitamin (MULTIVITAMIN) tablet Take 1 tablet by mouth daily.    . Norgestimate-Ethinyl Estradiol Triphasic (TRI-PREVIFEM) 0.18/0.215/0.25 MG-35 MCG tablet Take 1 tablet by mouth at bedtime.    . Omega-3 Fatty Acids (OMEGA-3 PLUS PO) Take by mouth.    [name] omeprazole (PRILOSEC) 20 MG capsule TAKE 1  CAPSULE (20 MG TOTAL) BY MOUTH DAILY. 90 capsule 3  . promethazine (PHENERGAN) 25 MG tablet promethazine 25 mg tablet    . potassium chloride (KLOR-CON) 20 MEQ packet Take 20 mEq by mouth daily. 180 packet 0   No facility-administered medications prior to visit.     ROS Review of Systems  Constitutional: Negative for diaphoresis, fatigue and unexpected weight change.  HENT: Negative.   Eyes: Negative.   Respiratory: Negative for cough, chest tightness, shortness of breath and wheezing.   Cardiovascular: Negative for chest pain, palpitations and leg swelling.  Gastrointestinal: Positive for nausea. Negative for abdominal pain, constipation, diarrhea and vomiting.  Genitourinary: Positive for decreased urine volume. Negative for difficulty urinating.  Musculoskeletal: Negative.   Skin: Negative.  Negative for color change and pallor.  Neurological: Positive for dizziness and light-headedness. Negative for syncope, weakness, numbness and headaches.  Hematological: Negative for adenopathy. Does not bruise/bleed easily.  Psychiatric/Behavioral: Negative.     Objective:  BP 110/82 (BP Location: Left Arm, Patient Position: Sitting, Cuff Size: Normal)   Pulse (!) 107   Temp 98.4 F (36.9 C) (Oral)   Ht 5\' 4"  (1.626 m)   Wt 127 lb 1.3 oz (57.6 kg)   LMP 12/23/2017   SpO2 99%   BMI 21.81 kg/m   BP Readings from Last 3 Encounters:  01/17/18 110/82  01/12/18 103/68  01/11/18 118/64    Wt Readings from Last 3 Encounters:  01/17/18 127 lb 1.3 oz (57.6 kg)  01/11/18 128 lb (58.1 kg)  12/27/17 132 lb 8 oz (60.1 kg)    Physical Exam  Constitutional: She is oriented to person, place, and time. No distress.  HENT:  Mouth/Throat: Oropharynx is clear and moist. No oropharyngeal exudate.  Eyes: Conjunctivae are normal. Left eye exhibits no discharge. No scleral icterus.  Neck: Normal range of motion. Neck supple. No JVD present. No thyromegaly present.  Cardiovascular: Normal rate,  regular rhythm and intact distal pulses. Exam reveals no gallop.  No murmur heard. Pulmonary/Chest: Effort normal and breath sounds normal. No respiratory distress. She has no wheezes. She has no rales.  Abdominal: Soft. Bowel sounds are normal. She exhibits no distension and no mass. There is no tenderness. There is no guarding.  Musculoskeletal: Normal range of motion. She exhibits no edema, tenderness or deformity.  Lymphadenopathy:    She has no cervical adenopathy.  Neurological: She is alert and oriented to person, place, and time.  Skin: Skin is warm and dry. No rash noted. She is not diaphoretic. No erythema. No pallor.  Vitals reviewed.   Lab Results  Component Value Date   WBC 6.5 01/12/2018   HGB 13.1 01/12/2018   HCT 39.0 01/12/2018   PLT 295 01/12/2018   GLUCOSE 74 01/17/2018   CHOL 196 07/21/2016   TRIG 209 (H) 07/21/2016   HDL 39 (L) 07/21/2016   LDLCALC 115 (H) 07/21/2016   ALT 12 12/01/2017   AST 16 12/01/2017   NA 138 01/17/2018   K 3.1 (L) 01/17/2018   CL 96 01/17/2018   CREATININE 0.91 01/17/2018   BUN 13 01/17/2018   CO2 33 (H) 01/17/2018   TSH 0.58 12/01/2017   HGBA1C 5.2 07/27/2016    Ct Head Wo Contrast  Result Date: 01/12/2018 CLINICAL DATA:  Recurrent syncope. Syncope while standing up to shut a window. EXAM: CT HEAD WITHOUT CONTRAST TECHNIQUE: Contiguous axial images were obtained from the base of the skull through the vertex without intravenous contrast. COMPARISON:  None. FINDINGS: Brain: No intracranial hemorrhage, mass effect, or midline shift. No hydrocephalus. The basilar cisterns are patent. No evidence of territorial infarct or acute ischemia. No extra-axial or intracranial fluid collection. Vascular: No hyperdense vessel or unexpected calcification. Skull: No fracture or focal lesion. Sinuses/Orbits: Paranasal sinuses and mastoid air cells are clear. The visualized orbits are unremarkable. Other: None. IMPRESSION: Normal noncontrast head CT.  Electronically Signed   By: Rubye OaksMelanie  Ehinger M.D.   On: 01/12/2018 02:03    Assessment & Plan:   [name] was seen today for abnormal lab.  Diagnoses and all orders for this visit:  Hypokalemia- Her potassium level is down to 3.1.  I have reassured her that the appearance of the matrix tablet in her stool is not abnormal and it should not concern her.  I have asked her to be compliant with potassium replacement therapy and have increased her dose to 3 times a day.  I have asked to return in about 2 weeks for me to recheck her potassium level. -     Basic metabolic panel; Future -     potassium chloride SA (K-DUR,KLOR-CON) 20 MEQ tablet; Take 1 tablet (20 mEq total) by mouth 3 (three) times daily.  Eating disorder- She tells me that she has an appointment in the next few months to have her eating disorder treated.  In the meantime I have asked her  to liberalize her intake of calories and liquids.   I have changed Lavonna Z. Plaut's potassium chloride to potassium chloride SA. I am also having her maintain her Norgestimate-Ethinyl Estradiol Triphasic, omeprazole, Omega-3 Fatty Acids (OMEGA-3 PLUS PO), Biotin, multivitamin, linaclotide, citalopram, VAYARIN PLUS, lisdexamfetamine, and promethazine.  Meds ordered this encounter  Medications  . potassium chloride SA (K-DUR,KLOR-CON) 20 MEQ tablet    Sig: Take 1 tablet (20 mEq total) by mouth 3 (three) times daily.    Dispense:  90 tablet    Refill:  3     Follow-up: Return in about 1 week (around 01/24/2018).  Sanda Linger, MD

## 2018-01-20 ENCOUNTER — Other Ambulatory Visit: Payer: Self-pay | Admitting: Internal Medicine

## 2018-01-20 DIAGNOSIS — R41840 Attention and concentration deficit: Secondary | ICD-10-CM

## 2018-01-20 MED ORDER — LISDEXAMFETAMINE DIMESYLATE 20 MG PO CAPS: 20.0000 mg | ORAL_CAPSULE | Freq: Every day | ORAL | 0 refills | Status: DC

## 2018-01-27 ENCOUNTER — Ambulatory Visit: Payer: Self-pay

## 2018-01-27 NOTE — Telephone Encounter (Signed)
Phone call from pt.  Reported she has had near fainting episodes multiple times over past 3 days, and she did faint x 1, this afternoon.  Reported she has had intermittent episodes like the near fainting, over past 2 years, but that the episodes have increased a lot over past 3 days. Stated "too many to count."  Admitted to anorexia.  Reported she is working on improving her diet and hydration.  Stated she is eating protein and drinking Naked Juices, Pedialyte and water.  Reported this afternoon, she stood up to walk to the kitchen, and felt her head get hot, her vision became fuzzy and she saw colors, then everything became black, and she was on the floor.  Stated she did not injure her self.  Reported she has intermittent nausea and vomits about 1-2 x/day.  Reported her BP today after her fainting spell was 85/62.  Pulse checked while on the phone with pt.; reported 84 and regular.  Advised to go to the ER due to the severity of symptoms.  Reported "I don't want to go back there; I was there 2 weeks ago, and it took hours."  Appt. Given for acute visit tomorrow at 9:20 AM.  Strongly emphasized care advice for hydration, changing position slowly/gradually, eating protein source.  Strongly advised to go to the ER if fainting occurs again this evening, or symptoms worsen.  Stated she understood and agreed.  Reported she lives with her family, and that her sister is at home with her.        Reason for Disposition . [1] MODERATE dizziness (e.g., interferes with normal activities) AND [2] has NOT been evaluated by physician for this  (Exception: dizziness caused by heat exposure, sudden standing, or poor fluid intake)  Answer Assessment - Initial Assessment Questions 1. DESCRIPTION: "Describe your dizziness."     C/o episodes of feeling like she is going to black out 2. LIGHTHEADED: "Do you feel lightheaded?" (e.g., somewhat faint, woozy, weak upon standing)     Yes 3. VERTIGO: "Do you feel like either you or  the room is spinning or tilting?" (i.e. vertigo)     No spinning or tilting 4. SEVERITY: "How bad is it?"  "Do you feel like you are going to faint?" "Can you stand and walk?"   - MILD - walking normally   - MODERATE - interferes with normal activities (e.g., work, school)    - SEVERE - unable to stand, requires support to walk, feels like passing out now.      severe 5. ONSET:  "When did the dizziness begin?"     3 days ago 6. AGGRAVATING FACTORS: "Does anything make it worse?" (e.g., standing, change in head position)     Was usually changing position.   7. HEART RATE: "Can you tell me your heart rate?" "How many beats in 15 seconds?"  (Note: not all patients can do this)      84 and regular 8. CAUSE: "What do you think is causing the dizziness?"     unknown 9. RECURRENT SYMPTOM: "Have you had dizziness before?" If so, ask: "When was the last time?" "What happened that time?"     Has been having intermittent near syncopal episodes over past 2 years; reported episodes have worsened over 3 days.  10. OTHER SYMPTOMS: "Do you have any other symptoms?" (e.g., fever, chest pain, vomiting, diarrhea, bleeding)       Feels like heart speeds up,  11. PREGNANCY: "Is there any chance you  are pregnant?" "When was your last menstrual period?"       No; LMP 1 week ago  Protocols used: DIZZINESS [facility]- LIGHTHEADEDNESS-A-AH

## 2018-01-28 ENCOUNTER — Encounter (HOSPITAL_COMMUNITY): Payer: Self-pay | Admitting: Emergency Medicine

## 2018-01-28 ENCOUNTER — Other Ambulatory Visit: Payer: Self-pay

## 2018-01-28 ENCOUNTER — Emergency Department (HOSPITAL_COMMUNITY)
Admission: EM | Admit: 2018-01-28 | Discharge: 2018-01-28 | Disposition: A | Discharge status: Home / Self Care | Payer: BLUE CROSS/BLUE SHIELD | Attending: Emergency Medicine | Admitting: Emergency Medicine

## 2018-01-28 ENCOUNTER — Ambulatory Visit: Payer: BLUE CROSS/BLUE SHIELD | Admitting: Family

## 2018-01-28 ENCOUNTER — Other Ambulatory Visit (INDEPENDENT_AMBULATORY_CARE_PROVIDER_SITE_OTHER): Payer: BLUE CROSS/BLUE SHIELD

## 2018-01-28 ENCOUNTER — Encounter: Payer: Self-pay | Admitting: Family

## 2018-01-28 VITALS — BP 102/78 | HR 98 | Temp 99.2°F | Ht 64.0 in | Wt 128.0 lb

## 2018-01-28 DIAGNOSIS — R944 Abnormal results of kidney function studies: Secondary | ICD-10-CM | POA: Insufficient documentation

## 2018-01-28 DIAGNOSIS — R55 Syncope and collapse: Secondary | ICD-10-CM | POA: Diagnosis not present

## 2018-01-28 DIAGNOSIS — I10 Essential (primary) hypertension: Secondary | ICD-10-CM | POA: Insufficient documentation

## 2018-01-28 DIAGNOSIS — Z79899 Other long term (current) drug therapy: Secondary | ICD-10-CM | POA: Diagnosis not present

## 2018-01-28 DIAGNOSIS — I959 Hypotension, unspecified: Secondary | ICD-10-CM

## 2018-01-28 DIAGNOSIS — R42 Dizziness and giddiness: Secondary | ICD-10-CM | POA: Diagnosis present

## 2018-01-28 DIAGNOSIS — R7989 Other specified abnormal findings of blood chemistry: Secondary | ICD-10-CM

## 2018-01-28 LAB — COMPREHENSIVE METABOLIC PANEL
ALBUMIN: 4 g/dL (ref 3.5–5.2)
ALT: 17 U/L (ref 0–35)
AST: 19 U/L (ref 0–37)
Alkaline Phosphatase: 44 U/L (ref 39–117)
BILIRUBIN TOTAL: 0.3 mg/dL (ref 0.2–1.2)
BUN: 23 mg/dL (ref 6–23)
CALCIUM: 10 mg/dL (ref 8.4–10.5)
CO2: 28 meq/L (ref 19–32)
CREATININE: 1.29 mg/dL — AB (ref 0.40–1.20)
Chloride: 98 mEq/L (ref 96–112)
GFR: 54.85 mL/min — ABNORMAL LOW (ref >60.00)
Glucose, Bld: 71 mg/dL (ref 70–99)
Potassium: 4.3 mEq/L (ref 3.5–5.1)
Sodium: 138 mEq/L (ref 135–145)
Total Protein: 7.7 g/dL (ref 6.0–8.3)

## 2018-01-28 LAB — MAGNESIUM: Magnesium: 1.6 mg/dL (ref 1.5–2.5)

## 2018-01-28 LAB — VITAMIN B12: VITAMIN B 12: 340 pg/mL (ref 211–911)

## 2018-01-28 MED ORDER — SODIUM CHLORIDE 0.9 % IV BOLUS (SEPSIS)
1000.0000 mL | Freq: Once | INTRAVENOUS | Status: AC
  Administered 2018-01-28: 1000 mL via INTRAVENOUS

## 2018-01-28 NOTE — Progress Notes (Signed)
Spoke to patient and her mother about concerns for AKI; recommended ER evaluation due to increased episodes of syncope and abnormal labs. [facility] agree and [facility]; charge nurse notified.

## 2018-01-28 NOTE — ED Triage Notes (Signed)
Pt sent from doctor's office related to elevated creatinine. Level 1.29.

## 2018-01-28 NOTE — Discharge Instructions (Signed)
Please keep your follow-up appointment with your primary care provider.  If you develop any new or worsening symptoms including if you pass out, have chest pain or shortness of breath, still producing urine, or have any other new concerning symptoms, please return to the emergency department for re-evaluation.

## 2018-01-28 NOTE — Progress Notes (Signed)
Stacey Holland is a 22 y.o. female with the following history as recorded in EpicCare:  Patient Active Problem List   Diagnosis Date Noted  . Eating disorder 12/01/2017  . Attention and concentration deficit 12/01/2017  . Chronic idiopathic constipation 12/01/2017  . Vomiting associated with bulimia nervosa with nausea 06/22/2017  . Hypomagnesemia 09/25/2016  . Hypokalemia 09/18/2016  . Routine general medical examination at a health care facility 08/26/2016  . Atypical lymphocytosis 08/25/2016  . PCOS (polycystic ovarian syndrome) 04/03/2016  . Essential hypertension 02/20/2015  . GE reflux     Current Outpatient Medications  Medication Sig Dispense Refill  . Biotin 1000 MCG tablet Take 1,000 mcg by mouth 3 (three) times daily.    . citalopram (CELEXA) 20 MG tablet Take 1 tablet (20 mg total) by mouth daily. 90 tablet 1  . Dietary Management Product (VAYARIN PLUS) CAPS Take 1 capsule by mouth 2 (two) times daily. 180 capsule 1  . linaclotide (LINZESS) 72 MCG capsule Take 1 capsule (72 mcg total) by mouth daily before breakfast. 90 capsule 1  . lisdexamfetamine (VYVANSE) 20 MG capsule Take 1 capsule (20 mg total) by mouth daily. 30 capsule 0  . Multiple Vitamin (MULTIVITAMIN) tablet Take 1 tablet by mouth daily.    . Norgestimate-Ethinyl Estradiol Triphasic (TRI-PREVIFEM) 0.18/0.215/0.25 MG-35 MCG tablet Take 1 tablet by mouth at bedtime.    [name] omeprazole (PRILOSEC) 20 MG capsule TAKE 1 CAPSULE (20 MG TOTAL) BY MOUTH DAILY. 90 capsule 3  . potassium chloride SA (K-DUR,KLOR-CON) 20 MEQ tablet Take 1 tablet (20 mEq total) by mouth 3 (three) times daily. 90 tablet 3   No current facility-administered medications for this visit.     Allergies: Pineapple and Other  Past Medical History:  Diagnosis Date  . Dyspnea    breathing heavyeven when sitting, lying dpwn,exertion  . GERD (gastroesophageal reflux disease)   . Headache   . History of blood transfusion    prematurity, this was  during perinatal period  . Hypertension   . Morbid obesity ([place]) 04/03/2016  . PCOS (polycystic ovarian syndrome) 04/03/2016  . Reflux     Past Surgical History:  Procedure Laterality Date  . CHOLECYSTECTOMY N/A 11/25/2016   Procedure: LAPAROSCOPIC CHOLECYSTECTOMY;  Surgeon: Ralene Ok, MD;  Location: [facility];  Service: General;  Laterality: N/A;  . ESOPHAGOGASTRODUODENOSCOPY N/A 09/26/2016   Procedure: ESOPHAGOGASTRODUODENOSCOPY (EGD);  Surgeon: Jerene Bears, MD;  Location: [facility];  Service: Endoscopy;  Laterality: N/A;  . eustachion tubes      Family History  Problem Relation Age of Onset  . GER disease Mother   . Hypertension Mother   . GER disease Father   . Hypertension Father   . Lactose intolerance Brother   . Hypertension Maternal Grandfather   . Cancer Neg Hx   . Depression Neg Hx   . Drug abuse Neg Hx   . Early death Neg Hx   . Heart disease Neg Hx   . Hyperlipidemia Neg Hx   . Kidney disease Neg Hx   . Alcohol abuse Neg Hx   . Asthma Neg Hx   . Stroke Neg Hx     Social History   Tobacco Use  . Smoking status: Never Smoker  . Smokeless tobacco: Never Used  Substance Use Topics  . Alcohol use: No    Subjective:  Patient presents with concerns for worsening episodes of dizziness/ syncope; notes that for the past 3-4 days has felt like episodes occurred daily; Notes that  has had problems with "blacking out" for the past 2 years "on and off" but seems much worse in the past few days; LMP- 3 days ago; Feels like blood pressure starts out normal and "drops as the day progresses." Notes that when she has the sensation, her head " will start to go hot", breathing becomes heavy, can hear "flat beeping" sound in the ear, nausea, legs feel " really week." Notes that friends will see a look on her face "that trying to focus" prior to onset of symptoms." Episode can last for 15-20 minutes;  Was in ER earlier this month with episode of syncope; head CT was done and EKG  done- both normal; thought to be vasovagal; also had 2 visits at this office in the past month for similar symptoms; Known history of hypokalemia and anorexia; patient is adamant that she is eating well- not counting calories and eating 3 good meals daily; is taking potassium 20 meq tid; will be seeing behavior health specialist in June about treatment for the anorexia;  Denies any concerns for excess stress; feels that she is sleeping well;    Objective:  Vitals:   01/28/18 0933  BP: 102/78  Pulse: 98  Temp: 99.2 F (37.3 C)  TempSrc: Oral  SpO2: 98%  Weight: 128 lb (58.1 kg)  Height: '5\' 4"'$  (1.626 m)    General: Well developed, well nourished, in no acute distress  Skin : Warm and dry.  Head: Normocephalic and atraumatic  Eyes: Sclera and conjunctiva clear; pupils round and reactive to light; extraocular movements intact  Ears: External normal; canals clear; tympanic membranes normal  Oropharynx: Pink, supple. No suspicious lesions  Neck: Supple without thyromegaly, adenopathy  Lungs: Respirations unlabored; clear to auscultation bilaterally without wheeze, rales, rhonchi  CVS exam: normal rate and regular rhythm.  Neurologic: Alert and oriented; speech intact; face symmetrical; moves all extremities well; CNII-XII intact without focal deficit   Assessment:  1. Syncope, unspecified syncope type   2. Hypotension, unspecified hypotension type     Plan: Known history of eating disorder but am concerned about frequency of episodes/ worsening symptoms; check EKG today- NSR; repeat CMP today- if potassium remains low, may need evaluation by nephrology; will go ahead with cardiology and neurology evaluation; stressed need to keep planned follow-up with behavior health specialist as well; strict ER precautions for upcoming weekend; follow up to be determined.    No follow-ups on file.  Orders Placed This Encounter  Procedures  . Comp Met (CMET)    Standing Status:   Future    Number  of Occurrences:   1    Standing Expiration Date:   01/28/2019  . Magnesium    Standing Status:   Future    Number of Occurrences:   1    Standing Expiration Date:   01/28/2019  . B12    Standing Status:   Future    Number of Occurrences:   1    Standing Expiration Date:   01/28/2019  . Ambulatory referral to Cardiology    Referral Priority:   Urgent    Referral Type:   Consultation    Referral Reason:   Specialty Services Required    Requested Specialty:   Cardiology    Number of Visits Requested:   1  . Ambulatory referral to Neurology    Referral Priority:   Routine    Referral Type:   Consultation    Referral Reason:   Specialty Services Required  Requested Specialty:   Neurology    Number of Visits Requested:   1  . EKG 12-Lead    Requested Prescriptions    No prescriptions requested or ordered in this encounter

## 2018-01-28 NOTE — ED Notes (Signed)
Ambulated pt to the bathroom and back to room.  Pt stated that when she first started walking she felt weird and like her vision was going but then it came back.

## 2018-01-28 NOTE — ED Provider Notes (Signed)
[facility]-EMERGENCY DEPT Provider Note   CSN: [id number] Arrival date & time: 01/28/18  1557     History   Chief Complaint Chief Complaint  Patient presents with  . Abnormal Lab    HPI Stacey Holland is a 22 y.o. female with a history of anorexia, bulimia nervosa, PCOS, and HTN presenting from her PCP for abnormal lab value.   Patient endorses worsening episodes of lightheadedness over the last 3-4 days.  Lightheadedness is worse when going from sitting to standing and alleviated by nothing.  She states that she has had several episodes where she felt like she might pass out, but denies syncope.  She states that she has had problems with "blacking out" on and off for the last 2 years, but the last few days have been much worse.   She was following up with her PCP this morning due to her worsening symptoms  Patient states that she has been eating and drinking well.  She has been compliant with home potassium.  She has follow-up with behavioral health in June for treatment for anorexia.  LMP 01/20/18.   The history is provided by the patient. No language interpreter was used.  Abnormal Lab  Patient referred by:  PCP Result type: chemistry   Chemistry:    Creatinine:  High   Past Medical History:  Diagnosis Date  . Dyspnea    breathing heavyeven when sitting, lying dpwn,exertion  . GERD (gastroesophageal reflux disease)   . Headache   . History of blood transfusion    prematurity, this was during perinatal period  . Hypertension   . Morbid obesity ([facility]) 04/03/2016  . PCOS (polycystic ovarian syndrome) 04/03/2016  . Reflux     Patient Active Problem List   Diagnosis Date Noted  . Eating disorder 12/01/2017  . Attention and concentration deficit 12/01/2017  . Chronic idiopathic constipation 12/01/2017  . Vomiting associated with bulimia nervosa with nausea 06/22/2017  . Hypomagnesemia 09/25/2016  . Hypokalemia 09/18/2016  . Routine general  medical examination at a health care facility 08/26/2016  . Atypical lymphocytosis 08/25/2016  . PCOS (polycystic ovarian syndrome) 04/03/2016  . Essential hypertension 02/20/2015  . GE reflux     Past Surgical History:  Procedure Laterality Date  . CHOLECYSTECTOMY N/A 11/25/2016   Procedure: LAPAROSCOPIC CHOLECYSTECTOMY;  Surgeon: Axel Filler, MD;  Location: [facility] OR;  Service: General;  Laterality: N/A;  . ESOPHAGOGASTRODUODENOSCOPY N/A 09/26/2016   Procedure: ESOPHAGOGASTRODUODENOSCOPY (EGD);  Surgeon: Beverley Fiedler, MD;  Location: [facility];  Service: Endoscopy;  Laterality: N/A;  . eustachion tubes       OB History   None      Home Medications    Prior to Admission medications   Medication Sig Start Date End Date Taking? Authorizing Provider  cetirizine (ZYRTEC) 10 MG tablet Take 10 mg by mouth daily.   Yes [provider]  citalopram (CELEXA) 20 MG tablet Take 1 tablet (20 mg total) by mouth daily. 12/01/17  Yes Etta Grandchild, MD  Dietary Management Product (VAYARIN PLUS) CAPS Take 1 capsule by mouth 2 (two) times daily. 12/14/17  Yes Etta Grandchild, MD  lisdexamfetamine (VYVANSE) 20 MG capsule Take 1 capsule (20 mg total) by mouth daily. 01/20/18  Yes Etta Grandchild, MD  Multiple Vitamin (MULTIVITAMIN) tablet Take 1 tablet by mouth daily.   Yes [provider]  Norgestimate-Ethinyl Estradiol Triphasic (TRI-PREVIFEM) 0.18/0.215/0.25 MG-35 MCG tablet Take 1 tablet by mouth at bedtime.   Yes  [provider]  omeprazole (PRILOSEC) 20 MG capsule TAKE 1 CAPSULE (20 MG TOTAL) BY MOUTH DAILY. 04/19/17  Yes Etta Grandchild, MD  potassium chloride SA (K-DUR,KLOR-CON) 20 MEQ tablet Take 1 tablet (20 mEq total) by mouth 3 (three) times daily. 01/17/18  Yes Etta Grandchild, MD  linaclotide Karlene Einstein) 72 MCG capsule Take 1 capsule (72 mcg total) by mouth daily before breakfast. Patient not taking: Reported on 01/28/2018 12/01/17   Etta Grandchild, MD    Family  History Family History  Problem Relation Age of Onset  . GER disease Mother   . Hypertension Mother   . GER disease Father   . Hypertension Father   . Lactose intolerance Brother   . Hypertension Maternal Grandfather   . Cancer Neg Hx   . Depression Neg Hx   . Drug abuse Neg Hx   . Early death Neg Hx   . Heart disease Neg Hx   . Hyperlipidemia Neg Hx   . Kidney disease Neg Hx   . Alcohol abuse Neg Hx   . Asthma Neg Hx   . Stroke Neg Hx     Social History Social History   Tobacco Use  . Smoking status: Never Smoker  . Smokeless tobacco: Never Used  Substance Use Topics  . Alcohol use: No  . Drug use: No     Allergies   Pineapple and Other   Review of Systems Review of Systems  Constitutional: Negative for activity change and chills.  HENT: Negative for congestion.   Respiratory: Negative for shortness of breath.   Cardiovascular: Negative for chest pain.  Gastrointestinal: Negative for abdominal pain, diarrhea, nausea and vomiting.  Genitourinary: Negative for dysuria.  Musculoskeletal: Negative for back pain.  Skin: Negative for rash.  Allergic/Immunologic: Negative for immunocompromised state.  Neurological: Positive for light-headedness. Negative for dizziness, syncope, weakness and headaches.  Psychiatric/Behavioral: Negative for confusion.     Physical Exam Updated Vital Signs BP 107/65 (BP Location: Right Arm)   Pulse 94   Temp 98.4 F (36.9 C) (Oral)   Resp 18   Ht 5\' 4"  (1.626 m)   Wt 59.1 kg (130 lb 4.8 oz)   LMP 01/20/2018   SpO2 100%   BMI 22.37 kg/m   Physical Exam  Constitutional: She is oriented to person, place, and time. She appears well-developed and well-nourished. No distress.  HENT:  Head: Normocephalic.  Eyes: Conjunctivae are normal.  Neck: Normal range of motion. Neck supple.  Cardiovascular: Normal rate, regular rhythm, normal heart sounds and intact distal pulses. Exam reveals no gallop and no friction rub.  No murmur  heard. Pulmonary/Chest: Effort normal. No stridor. No respiratory distress. She has no wheezes. She has no rales. She exhibits no tenderness.  Abdominal: Soft. Bowel sounds are normal. She exhibits no distension and no mass. There is no tenderness. There is no rebound and no guarding. No hernia.  Musculoskeletal: Normal range of motion. She exhibits no edema, tenderness or deformity.  Neurological: She is alert and oriented to person, place, and time. She displays normal reflexes. No cranial nerve deficit or sensory deficit. She exhibits normal muscle tone. Coordination normal.  Skin: Skin is warm. Capillary refill takes less than 2 seconds. No rash noted. She is not diaphoretic.  Psychiatric: Her behavior is normal.  Nursing note and vitals reviewed.    ED Treatments / Results  Labs (all labs ordered are listed, but only abnormal results are displayed) Labs Reviewed - No data to display  EKG None  Radiology No results found.  Procedures Procedures (including critical care time)  Medications Ordered in ED Medications  sodium chloride 0.9 % bolus 1,000 mL (1,000 mLs Intravenous New Bag/Given 01/28/18 2030)     Initial Impression / Assessment and Plan / ED Course  I have reviewed the triage vital signs and the nursing notes.  Pertinent labs & imaging results that were available during my care of the patient were reviewed by me and considered in my medical decision making (see chart for details).     22 year old female with a history of anorexia, bulimia nervosa, PCOS, and HTN presenting with elevated serum creatinine.  He had labs performed at her PCPs office this afternoon.  Labs reviewed.  Serum creatinine 1.29.  IV fluids given.  She was ambulated through the department and remained asymptomatic.  At this time I feel that no further urgent or emergent work-up is indicated.  We will discharge the patient home with follow-up to PCP.  She is hemodynamically stable and in no acute  distress.  She is safe for outpatient follow-up at this time.  Final Clinical Impressions(s) / ED Diagnoses   Final diagnoses:  Elevated serum creatinine    ED Discharge Orders    None       Barkley BoardsMcDonald, Adessa Primiano A, PA-C 01/28/18 2251    Maia PlanLong, Joshua G, MD 01/29/18 [phone]

## 2018-01-28 NOTE — Telephone Encounter (Signed)
FYI message

## 2018-02-01 ENCOUNTER — Encounter: Payer: Self-pay | Admitting: Cardiology

## 2018-02-01 ENCOUNTER — Ambulatory Visit (INDEPENDENT_AMBULATORY_CARE_PROVIDER_SITE_OTHER): Payer: BLUE CROSS/BLUE SHIELD | Admitting: Cardiology

## 2018-02-01 VITALS — BP 88/62 | HR 103 | Ht 64.0 in | Wt 128.0 lb

## 2018-02-01 DIAGNOSIS — I10 Essential (primary) hypertension: Secondary | ICD-10-CM | POA: Diagnosis not present

## 2018-02-01 DIAGNOSIS — F5000 Anorexia nervosa, unspecified: Secondary | ICD-10-CM | POA: Insufficient documentation

## 2018-02-01 DIAGNOSIS — R55 Syncope and collapse: Secondary | ICD-10-CM

## 2018-02-01 NOTE — Patient Instructions (Signed)
Medication Instructions:  Your physician recommends that you continue on your current medications as directed. Please refer to the Current Medication list given to you today.  Labwork: None ordered   Testing/Procedures: Your physician has requested that you have an echocardiogram. Echocardiography is a painless test that uses sound waves to create images of your heart. It provides your doctor with information about the size and shape of your heart and how well your heart's chambers and valves are working. This procedure takes approximately one hour. There are no restrictions for this procedure.   Follow-Up: You have been referred to Dr. Graciela HusbandsKlein for syncope   Your physician wants you to follow-up as needed with Dr. Mayford Knifeurner  Any Other Special Instructions Will Be Listed Below (If Applicable). Your physician recommends that you drink 64 oz of fluid daily and wear compression socks during the day.    Syncope Syncope is when you lose temporarily pass out (faint). Signs that you may be about to pass out include:  Feeling dizzy or light-headed.  Feeling sick to your stomach (nauseous).  Seeing all white or all black.  Having cold, clammy skin.  If you passed out, get help right away. Call your local emergency services (911 in the [place] Do not drive yourself to the hospital. Follow these instructions at home: Pay attention to any changes in your symptoms. Take these actions to help with your condition:  Have someone stay with you until you feel stable.  Do not drive, use machinery, or play sports until your doctor says it is okay.  Keep all follow-up visits as told by your doctor. This is important.  If you start to feel like you might pass out, lie down right away and raise (elevate) your feet above the level of your heart. Breathe deeply and steadily. Wait until all of the symptoms are gone.  Drink enough fluid to keep your pee (urine) clear or pale yellow.  If you are taking  blood pressure or heart medicine, get up slowly and spend many minutes getting ready to sit and then stand. This can help with dizziness.  Take over-the-counter and prescription medicines only as told by your doctor.  Get help right away if:  You have a very bad headache.  You have unusual pain in your chest, tummy, or back.  You are bleeding from your mouth or rectum.  You have black or tarry poop (stool).  You have a very fast or uneven heartbeat (palpitations).  It hurts to breathe.  You pass out once or more than once.  You have jerky movements that you cannot control (seizure).  You are confused.  You have trouble walking.  You are very weak.  You have vision problems. These symptoms may be an emergency. Do not wait to see if the symptoms will go away. Get medical help right away. Call your local emergency services (911 in the [place] Do not drive yourself to the hospital. This information is not intended to replace advice given to you by your health care provider. Make sure you discuss any questions you have with your health care provider. Document Released: 04/13/2008 Document Revised: 04/02/2016 Document Reviewed: 07/10/2015 Elsevier Interactive Patient Education  2018 [employer]    [facility] you [facility] choosing [facility]    Lyda PeroneRena Raihan Kimmel, [place]  [phone]  If you need a refill on your cardiac medications before your next appointment, please call your pharmacy.

## 2018-02-01 NOTE — Progress Notes (Signed)
Cardiology Office Note    Date:  02/01/2018   ID:  DORINNE GRAEFF, DOB 1996-02-04, MRN [id number]  PCP:  Etta Grandchild, MD  Cardiologist:  Armanda Magic, MD   Chief Complaint  Patient presents with  . New Patient (Initial Visit)    syncope    History of Present Illness:  AHSLEY Holland is a 22 y.o. female who is being seen today for the evaluation of syncope at the request of [place] Scrape Allyne Gee,*.  This is a 22yo female with a hx of GERD, HTN, morbid obesity, PCOS, anorexia and bulemia. She is referred for evaluation of recurrent syncopal episodes.  She says that about 3-4 weeks ago she has had 2 episodes of syncope.  She was morbidly obese at 320lbs and then started getting chronically nauseated and could not eat and lost a significant amount of weight and then was diagnosed with gallbladder problems and had a cholectstectomy and lost almost 200lbs.  After that she started having problems with bulemia and anorexia.  Before the weight loss she had HTN and since the weight loss she has had problems with hypotension and has had to be hospitalized several times in the past year for renal problems and has seen multiple MDs but never had a diagnosis.    About 3-4 weeks ago she got up off the floor to close a window and passed out.  For 3 years she has been having problems with orthostatic dizziness and presyncope.  Now she is getting dizzy when going from laying to standing.  She denies any laxatives or diuretics.  She is drinking pedialyte as well.  She says that she will start to feel her heart race prior to the presyncope and then her legs get weak.  She denies any CP or SOB.  There is no family history of hear disease or SCD.     Past Medical History:  Diagnosis Date  . Anorexia nervosa   . Dyspnea    breathing heavyeven when sitting, lying dpwn,exertion  . GERD (gastroesophageal reflux disease)   . Headache   . History of blood transfusion    prematurity, this was during  perinatal period  . Hypertension   . Morbid obesity (HCC) 04/03/2016  . PCOS (polycystic ovarian syndrome) 04/03/2016  . Reflux     Past Surgical History:  Procedure Laterality Date  . CHOLECYSTECTOMY N/A 11/25/2016   Procedure: LAPAROSCOPIC CHOLECYSTECTOMY;  Surgeon: Axel Filler, MD;  Location: [facility] OR;  Service: General;  Laterality: N/A;  . ESOPHAGOGASTRODUODENOSCOPY N/A 09/26/2016   Procedure: ESOPHAGOGASTRODUODENOSCOPY (EGD);  Surgeon: Beverley Fiedler, MD;  Location: [facility];  Service: Endoscopy;  Laterality: N/A;  . eustachion tubes      Current Medications: Current Meds  Medication Sig  . cetirizine (ZYRTEC) 10 MG tablet Take 10 mg by mouth daily.  . citalopram (CELEXA) 20 MG tablet Take 1 tablet (20 mg total) by mouth daily.  . Dietary Management Product (VAYARIN PLUS) CAPS Take 1 capsule by mouth 2 (two) times daily.  [name] linaclotide (LINZESS) 72 MCG capsule Take 1 capsule (72 mcg total) by mouth daily before breakfast.  . lisdexamfetamine (VYVANSE) 20 MG capsule Take 1 capsule (20 mg total) by mouth daily.  . Multiple Vitamin (MULTIVITAMIN) tablet Take 1 tablet by mouth daily.  . Norgestimate-Ethinyl Estradiol Triphasic (TRI-PREVIFEM) 0.18/0.215/0.25 MG-35 MCG tablet Take 1 tablet by mouth at bedtime.  [name] omeprazole (PRILOSEC) 20 MG capsule TAKE 1 CAPSULE (20 MG TOTAL) BY MOUTH DAILY.  [name]  potassium chloride SA (K-DUR,KLOR-CON) 20 MEQ tablet Take 1 tablet (20 mEq total) by mouth 3 (three) times daily.    Allergies:   Pineapple and Other   Social History   Socioeconomic History  . Marital status: Single    Spouse name: Not on file  . Number of children: Not on file  . Years of education: Not on file  . Highest education level: Not on file  Occupational History  . Not on file  Social Needs  . Financial resource strain: Not on file  . Food insecurity:    Worry: Not on file    Inability: Not on file  . Transportation needs:    Medical: Not on file    Non-medical:  Not on file  Tobacco Use  . Smoking status: Never Smoker  . Smokeless tobacco: Never Used  Substance and Sexual Activity  . Alcohol use: No  . Drug use: No  . Sexual activity: Never    Birth control/protection: Pill, Abstinence  Lifestyle  . Physical activity:    Days per week: Not on file    Minutes per session: Not on file  . Stress: Not on file  Relationships  . Social connections:    Talks on phone: Not on file    Gets together: Not on file    Attends religious service: Not on file    Active member of club or organization: Not on file    Attends meetings of clubs or organizations: Not on file    Relationship status: Not on file  Other Topics Concern  . Not on file  Social History Narrative   Studying mass communications at [employer]     Family History:  The patient's family history includes GER disease in her father and mother; Hypertension in her father, maternal grandfather, and mother; Lactose intolerance in her brother.   ROS:   Please see the history of present illness.    ROS All other systems reviewed and are negative.  No flowsheet data found.     PHYSICAL EXAM:   VS:  BP (!) 88/62   Pulse (!) 103   Ht 5\' 4"  (1.626 m)   Wt 128 lb (58.1 kg)   LMP 01/20/2018   SpO2 99%   BMI 21.97 kg/m    GEN: Well nourished, well developed, in no acute distress  HEENT: normal  Neck: no JVD, carotid bruits, or masses Cardiac: RRR; no murmurs, rubs, or gallops,no edema.  Intact distal pulses bilaterally.  Respiratory:  clear to auscultation bilaterally, normal work of breathing GI: soft, nontender, nondistended, + BS MS: no deformity or atrophy  Skin: warm and dry, no rash Neuro:  Alert and Oriented x 3, Strength and sensation are intact Psych: euthymic mood, full affect  Wt Readings from Last 3 Encounters:  02/01/18 128 lb (58.1 kg)  01/28/18 130 lb 4.8 oz (59.1 kg)  01/28/18 128 lb (58.1 kg)      Studies/Labs Reviewed:   EKG:  EKG is ordered today.  The ekg  ordered today demonstrates NSR with no ST changes done   Recent Labs: 12/01/2017: TSH 0.58 01/12/2018: Hemoglobin 13.1; Platelets 295 01/28/2018: ALT 17; BUN 23; Creatinine, Ser 1.29; Magnesium 1.6; Potassium 4.3; Sodium 138   Lipid Panel    Component Value Date/Time   CHOL 196 07/21/2016 2252   TRIG 209 (H) 07/21/2016 2252   HDL 39 (L) 07/21/2016 2252   CHOLHDL 5.0 07/21/2016 2252   VLDL 42 (H) 07/21/2016 2252   LDLCALC 115 (  H) 07/21/2016 2252    Additional studies/ records that were reviewed today include:  Office and ER notes    ASSESSMENT:    1. Syncope, unspecified syncope type   2. Essential hypertension   3. Anorexia nervosa      PLAN:  In order of problems listed above:  1.  Syncope - I suspect that this is orthostatic in nature and likely exacerbated by her anorexia which is not under control yet.  Her BP is soft today in office and drops 10mmHg with standing and heart rate increases to 122bpm.  She admits to continued vomiting at times and is in rehab.  I have recommended that she push fluids including Gatorade.  I have recommended that she try to drink at least 64ox of fluid daily and avoid caffeine, hot showers and get up slowly from a sitting position or laying down.  I have also recommended compression hose if standing for prolonged periods of time.  I am going to get a 2D echo to make sure LVF is normal.  She had a recent TSH and urine cortisol which were normal.  I have recommended referral to Dr. Graciela HusbandsKlein for further evaulation.  2.  HTN - this has resolved after 200lb weight loss.   3.  Anorexia Nervosa - currently continues to vomit.  In rehab now.    Medication Adjustments/Labs and Tests Ordered: Current medicines are reviewed at length with the patient today.  Concerns regarding medicines are outlined above.  Medication changes, Labs and Tests ordered today are listed in the Patient Instructions below.  There are no Patient Instructions on file for this  visit.   Signed, Armanda Magicraci Turner, MD  02/01/2018 3:14 PM    [facility] [address], [place], [place]  [address] Phone: [phone]; Fax: [phone]

## 2018-02-02 ENCOUNTER — Encounter (HOSPITAL_COMMUNITY): Payer: Self-pay | Admitting: *Deleted

## 2018-02-02 ENCOUNTER — Ambulatory Visit: Payer: Self-pay | Admitting: *Deleted

## 2018-02-02 ENCOUNTER — Emergency Department (HOSPITAL_COMMUNITY): Payer: BLUE CROSS/BLUE SHIELD

## 2018-02-02 ENCOUNTER — Emergency Department (HOSPITAL_COMMUNITY)
Admission: EM | Admit: 2018-02-02 | Discharge: 2018-02-02 | Disposition: A | Discharge status: Home / Self Care | Payer: BLUE CROSS/BLUE SHIELD | Attending: Emergency Medicine | Admitting: Emergency Medicine

## 2018-02-02 ENCOUNTER — Other Ambulatory Visit: Payer: Self-pay

## 2018-02-02 DIAGNOSIS — R531 Weakness: Secondary | ICD-10-CM | POA: Insufficient documentation

## 2018-02-02 DIAGNOSIS — Z79899 Other long term (current) drug therapy: Secondary | ICD-10-CM | POA: Insufficient documentation

## 2018-02-02 DIAGNOSIS — I10 Essential (primary) hypertension: Secondary | ICD-10-CM | POA: Insufficient documentation

## 2018-02-02 DIAGNOSIS — R110 Nausea: Secondary | ICD-10-CM | POA: Diagnosis not present

## 2018-02-02 DIAGNOSIS — R42 Dizziness and giddiness: Secondary | ICD-10-CM | POA: Insufficient documentation

## 2018-02-02 DIAGNOSIS — R1115 Cyclical vomiting syndrome unrelated to migraine: Secondary | ICD-10-CM

## 2018-02-02 DIAGNOSIS — R1084 Generalized abdominal pain: Secondary | ICD-10-CM | POA: Insufficient documentation

## 2018-02-02 DIAGNOSIS — G43A0 Cyclical vomiting, not intractable: Secondary | ICD-10-CM | POA: Diagnosis not present

## 2018-02-02 LAB — URINALYSIS, ROUTINE W REFLEX MICROSCOPIC
Bilirubin Urine: NEGATIVE
GLUCOSE, UA: NEGATIVE mg/dL
HGB URINE DIPSTICK: NEGATIVE
KETONES UR: NEGATIVE mg/dL
Leukocytes, UA: NEGATIVE
Nitrite: NEGATIVE
PH: 6 (ref 5.0–8.0)
Protein, ur: NEGATIVE mg/dL
Specific Gravity, Urine: 1.021 (ref 1.005–1.030)

## 2018-02-02 LAB — COMPREHENSIVE METABOLIC PANEL
ALBUMIN: 4.1 g/dL (ref 3.5–5.0)
ALT: 19 U/L (ref 14–54)
AST: 23 U/L (ref 15–41)
Alkaline Phosphatase: 54 U/L (ref 38–126)
Anion gap: 14 (ref 5–15)
BUN: 32 mg/dL — AB (ref 6–20)
CO2: 28 mmol/L (ref 22–32)
CREATININE: 1.69 mg/dL — AB (ref 0.44–1.00)
Calcium: 10.7 mg/dL — ABNORMAL HIGH (ref 8.9–10.3)
Chloride: 98 mmol/L — ABNORMAL LOW (ref 101–111)
GFR calc Af Amer: 49 mL/min — ABNORMAL LOW (ref >60)
GFR, EST NON AFRICAN AMERICAN: 42 mL/min — AB (ref >60)
GLUCOSE: 87 mg/dL (ref 65–99)
POTASSIUM: 5 mmol/L (ref 3.5–5.1)
SODIUM: 140 mmol/L (ref 135–145)
Total Bilirubin: 0.6 mg/dL (ref 0.3–1.2)
Total Protein: 8.2 g/dL — ABNORMAL HIGH (ref 6.5–8.1)

## 2018-02-02 LAB — CBC
HEMATOCRIT: 39.2 % (ref 36.0–46.0)
Hemoglobin: 13.1 g/dL (ref 12.0–15.0)
MCH: 27.5 pg (ref 26.0–34.0)
MCHC: 33.4 g/dL (ref 30.0–36.0)
MCV: 82.2 fL (ref 78.0–100.0)
PLATELETS: 331 10*3/uL (ref 150–400)
RBC: 4.77 MIL/uL (ref 3.87–5.11)
RDW: 12.2 % (ref 11.5–15.5)
WBC: 5 10*3/uL (ref 4.0–10.5)

## 2018-02-02 LAB — I-STAT BETA HCG BLOOD, ED (MC, WL, AP ONLY): I-stat hCG, quantitative: <5 m[IU]/mL (ref <5)

## 2018-02-02 LAB — LIPASE, BLOOD: LIPASE: 45 U/L (ref 11–51)

## 2018-02-02 MED ORDER — MORPHINE SULFATE (PF) 4 MG/ML IV SOLN
4.0000 mg | Freq: Once | INTRAVENOUS | Status: AC
  Administered 2018-02-02: 4 mg via INTRAVENOUS
  Filled 2018-02-02: qty 1

## 2018-02-02 MED ORDER — IOPAMIDOL (ISOVUE-300) INJECTION 61%
INTRAVENOUS | Status: AC
  Administered 2018-02-02: 80 mL
  Filled 2018-02-02: qty 100

## 2018-02-02 MED ORDER — ONDANSETRON 4 MG PO TBDP: 4.0000 mg | ORAL_TABLET | Freq: Three times a day (TID) | ORAL | 0 refills | Status: DC | PRN

## 2018-02-02 MED ORDER — DICYCLOMINE HCL 20 MG PO TABS: 20.0000 mg | ORAL_TABLET | Freq: Two times a day (BID) | ORAL | 0 refills | Status: DC

## 2018-02-02 MED ORDER — SODIUM CHLORIDE 0.9 % IV BOLUS
1000.0000 mL | Freq: Once | INTRAVENOUS | Status: AC
  Administered 2018-02-02: 1000 mL via INTRAVENOUS

## 2018-02-02 MED ORDER — ONDANSETRON 4 MG PO TBDP
4.0000 mg | ORAL_TABLET | Freq: Once | ORAL | Status: AC | PRN
  Administered 2018-02-02: 4 mg via ORAL
  Filled 2018-02-02: qty 1

## 2018-02-02 MED ORDER — ONDANSETRON HCL 4 MG/2ML IJ SOLN
4.0000 mg | Freq: Once | INTRAMUSCULAR | Status: AC
  Administered 2018-02-02: 4 mg via INTRAVENOUS
  Filled 2018-02-02: qty 2

## 2018-02-02 MED ORDER — DICYCLOMINE HCL 10 MG PO CAPS
20.0000 mg | ORAL_CAPSULE | Freq: Once | ORAL | Status: AC
  Administered 2018-02-02: 20 mg via ORAL
  Filled 2018-02-02: qty 2

## 2018-02-02 MED ORDER — IBUPROFEN 200 MG PO TABS
600.0000 mg | ORAL_TABLET | Freq: Once | ORAL | Status: AC
  Administered 2018-02-02: 600 mg via ORAL
  Filled 2018-02-02: qty 3

## 2018-02-02 NOTE — ED Notes (Signed)
Patient transported to CT 

## 2018-02-02 NOTE — Telephone Encounter (Signed)
Patient's mother is calling with concerns that her daughter is very ill.Patient is experiencing- nausea and vomiting- 3 times today. Heart rate and BP is elevated. Patient is feeling disoriented- patient now states her tongue is numb. Patient is having changes as I am triaging- advised mother patient needs to be seen at Ed- she is going to take her- advised if she gets any worse before they leave house call 911. Reason for Disposition . [1] Systolic BP  >= 160 OR Diastolic >= 100 AND [2] cardiac or neurologic symptoms (e.g., chest pain, difficulty breathing, unsteady gait, blurred vision)  Answer Assessment - Initial Assessment Questions 1. BLOOD PRESSURE: "What is the blood pressure?" "Did you take at least two measurements 5 minutes apart?"     144/115 P 127   114/74 P 135 2. ONSET: "When did you take your blood pressure?"     2:30 3. HOW: "How did you obtain the blood pressure?" (e.g., visiting nurse, automatic home BP monitor)     Automatic cuff 4. HISTORY: "Do you have a history of high blood pressure?"     Blood pressure usually lower 5. MEDICATIONS: "Are you taking any medications for blood pressure?" "Have you missed any doses recently?"     no 6. OTHER SYMPTOMS: "Do you have any symptoms?" (e.g., headache, chest pain, blurred vision, difficulty breathing, weakness)     Blurred vision- 4 weeks, disoriented, rapid heart rate, vomiting 7. PREGNANCY: "Is there any chance you are pregnant?" "When was your last menstrual period?"     No- LMP last week  Protocols used: HIGH BLOOD PRESSURE-A-AH

## 2018-02-02 NOTE — ED Triage Notes (Signed)
Pt's mother states the pt has been complaining of right flank, abdominal pain since yesterday. Pt was vomiting this morning and mother states she has been acting differently since this morning. Pt denies diarrhea.

## 2018-02-02 NOTE — ED Notes (Signed)
ED Provider at bedside. 

## 2018-02-02 NOTE — ED Provider Notes (Signed)
[facility]-EMERGENCY DEPT Provider Note   CSN: [id number] Arrival date & time: 02/02/18  1507     History   Chief Complaint Chief Complaint  Patient presents with  . Abdominal Pain  . Emesis    HPI Stacey Holland is a 22 y.o. female.  HPI   Stacey Holland is a 22 y.o. female, with a history of anorexia nervosa, bulimia, GERD, and hypertension, presenting to the ED with abdominal pain starting last night.  States pain was initially across the entire abdomen at the level of the umbilicus, but is now present in the area between the umbilicus and the right lower quadrant. Around 1 PM today she began to have persistent vomiting and lightheadedness.  Pain is currently 7/10, constant, sharp, radiates to the back.  She has not had similar pain in the past. Occasional alcohol use once or twice a week.  Denies illicit drug use.  Patient is sexually active with one female partner.  She still continues to have anorexic and bulimic tendencies.  She purges for vomiting and does not use laxatives.  Last purposeful purge was 2 days ago. Intermittent constipation, which is not unusual for her. Last food intake was pretzels just before patient was roomed. Denies urinary complaints, cough, shortness of breath, fever/chills, diarrhea, hematochezia/melena, hematemesis, abnormal vaginal discharge or bleeding, or any other complaints.   Past Medical History:  Diagnosis Date  . Anorexia nervosa   . Dyspnea    breathing heavyeven when sitting, lying dpwn,exertion  . GERD (gastroesophageal reflux disease)   . Headache   . History of blood transfusion    prematurity, this was during perinatal period  . Hypertension   . Morbid obesity (HCC) 04/03/2016  . PCOS (polycystic ovarian syndrome) 04/03/2016  . Reflux     Patient Active Problem List   Diagnosis Date Noted  . Syncope 02/01/2018  . Anorexia nervosa   . Eating disorder 12/01/2017  . Attention and concentration deficit  12/01/2017  . Chronic idiopathic constipation 12/01/2017  . Vomiting associated with bulimia nervosa with nausea 06/22/2017  . Hypomagnesemia 09/25/2016  . Hypokalemia 09/18/2016  . Routine general medical examination at a health care facility 08/26/2016  . Atypical lymphocytosis 08/25/2016  . PCOS (polycystic ovarian syndrome) 04/03/2016  . Essential hypertension 02/20/2015  . GE reflux     Past Surgical History:  Procedure Laterality Date  . CHOLECYSTECTOMY N/A 11/25/2016   Procedure: LAPAROSCOPIC CHOLECYSTECTOMY;  Surgeon: Axel Filler, MD;  Location: [facility] OR;  Service: General;  Laterality: N/A;  . ESOPHAGOGASTRODUODENOSCOPY N/A 09/26/2016   Procedure: ESOPHAGOGASTRODUODENOSCOPY (EGD);  Surgeon: Beverley Fiedler, MD;  Location: [facility];  Service: Endoscopy;  Laterality: N/A;  . eustachion tubes       OB History   None      Home Medications    Prior to Admission medications   Medication Sig Start Date End Date Taking? Authorizing Provider  cetirizine (ZYRTEC) 10 MG tablet Take 10 mg by mouth daily.   Yes [provider]  citalopram (CELEXA) 20 MG tablet Take 1 tablet (20 mg total) by mouth daily. 12/01/17  Yes Etta Grandchild, MD  Dietary Management Product (VAYARIN PLUS) CAPS Take 1 capsule by mouth 2 (two) times daily. 12/14/17  Yes Etta Grandchild, MD  linaclotide [facility]) 72 MCG capsule Take 1 capsule (72 mcg total) by mouth daily before breakfast. 12/01/17  Yes Etta Grandchild, MD  lisdexamfetamine (VYVANSE) 20 MG capsule Take 1 capsule (20 mg total) by  mouth daily. 01/20/18  Yes Etta Grandchild, MD  Multiple Vitamin (MULTIVITAMIN) tablet Take 1 tablet by mouth daily.   Yes [provider]  Norgestimate-Ethinyl Estradiol Triphasic (TRI-PREVIFEM) 0.18/0.215/0.25 MG-35 MCG tablet Take 1 tablet by mouth at bedtime.   Yes [provider]  omeprazole (PRILOSEC) 20 MG capsule TAKE 1 CAPSULE (20 MG TOTAL) BY MOUTH DAILY. 04/19/17  Yes Etta Grandchild, MD    potassium chloride SA (K-DUR,KLOR-CON) 20 MEQ tablet Take 1 tablet (20 mEq total) by mouth 3 (three) times daily. 01/17/18  Yes Etta Grandchild, MD  dicyclomine (BENTYL) 20 MG tablet Take 1 tablet (20 mg total) by mouth 2 (two) times daily. 02/02/18   Timea Breed C, PA-C  ondansetron (ZOFRAN ODT) 4 MG disintegrating tablet Take 1 tablet (4 mg total) by mouth every 8 (eight) hours as needed for nausea or vomiting. 02/02/18   Kedron Uno, Hillard Danker, PA-C    Family History Family History  Problem Relation Age of Onset  . GER disease Mother   . Hypertension Mother   . GER disease Father   . Hypertension Father   . Lactose intolerance Brother   . Hypertension Maternal Grandfather   . Cancer Neg Hx   . Depression Neg Hx   . Drug abuse Neg Hx   . Early death Neg Hx   . Heart disease Neg Hx   . Hyperlipidemia Neg Hx   . Kidney disease Neg Hx   . Alcohol abuse Neg Hx   . Asthma Neg Hx   . Stroke Neg Hx     Social History Social History   Tobacco Use  . Smoking status: Never Smoker  . Smokeless tobacco: Never Used  Substance Use Topics  . Alcohol use: No  . Drug use: No     Allergies   Pineapple and Other   Review of Systems Review of Systems  Constitutional: Negative for chills and fever.  Respiratory: Negative for cough and shortness of breath.   Gastrointestinal: Positive for abdominal pain, nausea and vomiting. Negative for blood in stool and diarrhea.  Genitourinary: Negative for dysuria, hematuria, vaginal bleeding and vaginal discharge.  Neurological: Positive for weakness (generalized) and light-headedness.  All other systems reviewed and are negative.    Physical Exam Updated Vital Signs BP 113/69 (BP Location: Right Arm)   Pulse 91   Temp 98.9 F (37.2 C) (Oral)   Resp 16   Wt 58.1 kg (128 lb)   LMP 01/20/2018   SpO2 100%   BMI 21.97 kg/m   Physical Exam  Constitutional: She appears well-developed and well-nourished. No distress.  HENT:  Head: Normocephalic  and atraumatic.  Eyes: Conjunctivae are normal.  Neck: Neck supple.  Cardiovascular: Normal rate, regular rhythm, normal heart sounds and intact distal pulses.  Pulmonary/Chest: Effort normal and breath sounds normal. No respiratory distress.  Abdominal: Soft. Bowel sounds are increased. There is tenderness. There is no rebound and no guarding.    Patient indicates her tenderness verbally only.  Musculoskeletal: She exhibits no edema.  Lymphadenopathy:    She has no cervical adenopathy.  Neurological: She is alert.  Skin: Skin is warm and dry. She is not diaphoretic.  Psychiatric: She has a normal mood and affect. Her behavior is normal.  Nursing note and vitals reviewed.    ED Treatments / Results  Labs (all labs ordered are listed, but only abnormal results are displayed) Labs Reviewed  COMPREHENSIVE METABOLIC PANEL - Abnormal; Notable for the following components:  Result Value   Chloride 98 (*)    BUN 32 (*)    Creatinine, Ser 1.69 (*)    Calcium 10.7 (*)    Total Protein 8.2 (*)    GFR calc non Af Amer 42 (*)    GFR calc Af Amer 49 (*)    All other components within normal limits  LIPASE, BLOOD  CBC  URINALYSIS, ROUTINE W REFLEX MICROSCOPIC  I-STAT BETA HCG BLOOD, ED ([facility], [facility], [facility] ONLY)    EKG None  Radiology Ct Abdomen Pelvis W Contrast  Result Date: 02/02/2018 CLINICAL DATA:  Right-sided flank pain for 2 days EXAM: CT ABDOMEN AND PELVIS WITH CONTRAST TECHNIQUE: Multidetector CT imaging of the abdomen and pelvis was performed using the standard protocol following bolus administration of intravenous contrast. CONTRAST:  80mL ISOVUE-300 IOPAMIDOL (ISOVUE-300) INJECTION 61% COMPARISON:  09/01/2016 FINDINGS: Lower chest: No acute abnormality. Hepatobiliary: No focal liver abnormality is seen. Status post cholecystectomy. No biliary dilatation. Pancreas: Unremarkable. No pancreatic ductal dilatation or surrounding inflammatory changes. Spleen: Normal in size without  focal abnormality. Adrenals/Urinary Tract: Adrenal glands are unremarkable. Kidneys are normal, without renal calculi, focal lesion, or hydronephrosis. Bladder is decompressed. Stomach/Bowel: Small bowel is within normal limits. The appendix is well visualized and air filled within normal limits. The cecum is significantly dilated to 5.5 cm with scattered low density material suggestive of fecal material. The possibility of underlying masslike filling defect could not be totally excluded. Comparison with the prior exam shows no focal abnormality in the cecum. Vascular/Lymphatic: No significant vascular findings are present. No enlarged abdominal or pelvic lymph nodes. Reproductive: Uterus and bilateral adnexa are unremarkable. Other: No abdominal wall hernia or abnormality. No abdominopelvic ascites. Musculoskeletal: No acute or significant osseous findings. IMPRESSION: Dilated cecum filled with what appears to be fluid and fecal material. This measures approximately 5.5 cm in transverse dimension and lies low within the pelvis adjacent to the right ovary. The appendix is well visualized and within normal limits. This may simply represent a somewhat prominent collection of fecal material. Repeat nonemergent CT with adequate oral contrast or barium enema may be helpful for further evaluation. No other focal abnormality is noted. Electronically Signed   By: Alcide Clever M.D.   On: 02/02/2018 21:35    Procedures Procedures (including critical care time)  Medications Ordered in ED Medications  ondansetron (ZOFRAN-ODT) disintegrating tablet 4 mg (4 mg Oral Given 02/02/18 1546)  morphine 4 MG/ML injection 4 mg (4 mg Intravenous Given 02/02/18 2056)  sodium chloride 0.9 % bolus 1,000 mL (0 mLs Intravenous Stopped 02/02/18 2157)  iopamidol (ISOVUE-300) 61 % injection (80 mLs  Contrast Given 02/02/18 2108)  morphine 4 MG/ML injection 4 mg (4 mg Intravenous Given 02/02/18 2153)  ondansetron (ZOFRAN) injection 4 mg (4  mg Intravenous Given 02/02/18 2153)     Initial Impression / Assessment and Plan / ED Course  I have reviewed the triage vital signs and the nursing notes.  Pertinent labs & imaging results that were available during my care of the patient were reviewed by me and considered in my medical decision making (see chart for details).  Clinical Course as of Feb 03 2239  Wed Feb 02, 2018  2206 [facility] radiology reading room for clarification of the read on the abdominal CT. Dr. Karle Starch gone for the night.  Spoke with Dr. Jake Samples, who reviewed the imaging herself. Agrees no appendicitis, no abscess, no obstruction. Cecum appears fluid-filled, but appearance is not very concerning. Repeat CT with oral  and IV contrast in about 2 days.  If fluid is causing the abnormality in the cecum, then it should be resolved by this time.   [SJ]    Clinical Course User Index [SJ] Miklos Bidinger C, PA-C   Patient presents with abdominal pain, nausea, and vomiting. Patient is nontoxic appearing, afebrile, not tachycardic on my exam, not tachypneic, not hypotensive, maintains SPO2 of 100% on room air, and is in no apparent distress.  Minor increase in creatinine and BUN, suspect dehydration.  No evidence of appendicitis or obstruction on CT.  Dilated cecum noted.  Patient to have repeat CT with IV and oral contrast in 2 days to ensure resolution.  The patient was given instructions for home care as well as return precautions. Patient voices understanding of these instructions, accepts the plan, and is comfortable with discharge.   Vitals:   02/02/18 2120 02/02/18 2128 02/02/18 2130 02/02/18 2153  BP:  108/63 112/87 98/77  Pulse: 91 86  86  Resp:    18  Temp:    98.2 F (36.8 C)  TempSrc:    Oral  SpO2: 100% 100%  100%  Weight:    58.1 kg (128 lb)  Height:    5\' 4"  (1.626 m)     Final Clinical Impressions(s) / ED Diagnoses   Final diagnoses:  Generalized abdominal pain  Non-intractable cyclical vomiting with  nausea    ED Discharge Orders        Ordered    ondansetron (ZOFRAN ODT) 4 MG disintegrating tablet  Every 8 hours PRN     02/02/18 2225    dicyclomine (BENTYL) 20 MG tablet  2 times daily     02/02/18 2225       Concepcion LivingJoy, Miarose Lippert C, PA-C 02/02/18 2318    Rolan BuccoBelfi, Melanie, MD 02/02/18 2328

## 2018-02-02 NOTE — ED Notes (Signed)
Patient requesting additional nausea medication-patient had Zofran at 1746-explained waiting on provider for further evaluation

## 2018-02-02 NOTE — Discharge Instructions (Addendum)
Your symptoms may be caused by a viral illness.  It is recommended that you have a repeat CT of the abdomen/pelvis with IV and oral contrast in 2 days from now to reevaluate abnormalities found today. This can be ordered through your primary care provider or by returning to the ED.   Hand washing: Wash your hands throughout the day, but especially before and after touching the face, using the restroom, sneezing, coughing, or touching surfaces that have been coughed or sneezed upon. Hydration: Symptoms will be intensified and complicated by dehydration. Dehydration can also extend the duration of symptoms. Drink plenty of fluids and get plenty of rest. You should be drinking at least half a liter of water an hour to stay hydrated. Electrolyte drinks (ex. Gatorade, Powerade, Pedialyte) are also encouraged. You should be drinking enough fluids to make your urine light yellow, almost clear. If this is not the case, you are not drinking enough water. Please note that some of the treatments indicated below will not be effective if you are not adequately hydrated. Diet: Please concentrate on hydration, however, you may introduce food slowly.  Start with a clear liquid diet, progressed to a full liquid diet, and then bland solids as you are able. Pain or fever: Ibuprofen, Naproxen, or Tylenol for pain or fever.  Bentyl: May take the Bentyl, as needed, for abdominal pain. Nausea/vomiting: Use the Zofran for nausea or vomiting.  Follow up: Follow-up with your primary care provider as soon as possible on this matter.

## 2018-02-04 ENCOUNTER — Ambulatory Visit: Payer: BLUE CROSS/BLUE SHIELD | Admitting: Family

## 2018-02-04 ENCOUNTER — Other Ambulatory Visit (INDEPENDENT_AMBULATORY_CARE_PROVIDER_SITE_OTHER): Payer: BLUE CROSS/BLUE SHIELD

## 2018-02-04 ENCOUNTER — Encounter: Payer: Self-pay | Admitting: Family

## 2018-02-04 DIAGNOSIS — R109 Unspecified abdominal pain: Secondary | ICD-10-CM

## 2018-02-04 DIAGNOSIS — R7989 Other specified abnormal findings of blood chemistry: Secondary | ICD-10-CM

## 2018-02-04 LAB — COMPREHENSIVE METABOLIC PANEL
ALK PHOS: 41 U/L (ref 39–117)
ALT: 13 U/L (ref 0–35)
AST: 16 U/L (ref 0–37)
Albumin: 3.4 g/dL — ABNORMAL LOW (ref 3.5–5.2)
BUN: 22 mg/dL (ref 6–23)
CO2: 28 meq/L (ref 19–32)
Calcium: 9.1 mg/dL (ref 8.4–10.5)
Chloride: 97 mEq/L (ref 96–112)
Creatinine, Ser: 1.25 mg/dL — ABNORMAL HIGH (ref 0.40–1.20)
GFR: 56.87 mL/min — AB (ref >60.00)
GLUCOSE: 95 mg/dL (ref 70–99)
POTASSIUM: 4.8 meq/L (ref 3.5–5.1)
SODIUM: 130 meq/L — AB (ref 135–145)
TOTAL PROTEIN: 6.8 g/dL (ref 6.0–8.3)
Total Bilirubin: 0.4 mg/dL (ref 0.2–1.2)

## 2018-02-04 MED ORDER — PANTOPRAZOLE SODIUM 40 MG PO TBEC: 40.0000 mg | DELAYED_RELEASE_TABLET | Freq: Two times a day (BID) | ORAL | 1 refills | Status: DC

## 2018-02-04 MED ORDER — HYOSCYAMINE SULFATE 0.125 MG PO TABS: 0.1250 mg | ORAL_TABLET | Freq: Four times a day (QID) | ORAL | 0 refills | Status: DC | PRN

## 2018-02-04 MED ORDER — PROMETHAZINE HCL 25 MG PO TABS: 25.0000 mg | ORAL_TABLET | Freq: Three times a day (TID) | ORAL | 0 refills | Status: DC | PRN

## 2018-02-04 NOTE — Progress Notes (Signed)
Stacey Holland is a 22 y.o. female with the following history as recorded in Epic[facility]:  Patient Active Problem List   Diagnosis Date Noted  . Syncope 02/01/2018  . Anorexia nervosa   . Eating disorder 12/01/2017  . Attention and concentration deficit 12/01/2017  . Chronic idiopathic constipation 12/01/2017  . Vomiting associated with bulimia nervosa with nausea 06/22/2017  . Hypomagnesemia 09/25/2016  . Hypokalemia 09/18/2016  . Routine general medical examination at a health care facility 08/26/2016  . Atypical lymphocytosis 08/25/2016  . PCOS (polycystic ovarian syndrome) 04/03/2016  . Essential hypertension 02/20/2015  . GE reflux     Current Outpatient Medications  Medication Sig Dispense Refill  . cetirizine (ZYRTEC) 10 MG tablet Take 10 mg by mouth daily.    . citalopram (CELEXA) 20 MG tablet Take 1 tablet (20 mg total) by mouth daily. 90 tablet 1  . Dietary Management Product (VAYARIN PLUS) CAPS Take 1 capsule by mouth 2 (two) times daily. 180 capsule 1  . lisdexamfetamine (VYVANSE) 20 MG capsule Take 1 capsule (20 mg total) by mouth daily. 30 capsule 0  . Multiple Vitamin (MULTIVITAMIN) tablet Take 1 tablet by mouth daily.    . Norgestimate-Ethinyl Estradiol Triphasic (TRI-PREVIFEM) 0.18/0.215/0.25 MG-35 MCG tablet Take 1 tablet by mouth at bedtime.    . potassium chloride SA (K-DUR,KLOR-CON) 20 MEQ tablet Take 1 tablet (20 mEq total) by mouth 3 (three) times daily. 90 tablet 3  . hyoscyamine (LEVSIN) 0.125 MG tablet Take 1 tablet (0.125 mg total) by mouth every 6 (six) hours as needed for cramping. 20 tablet 0  . pantoprazole (PROTONIX) 40 MG tablet Take 1 tablet (40 mg total) by mouth 2 (two) times daily. 60 tablet 1  . promethazine (PHENERGAN) 25 MG tablet Take 1 tablet (25 mg total) by mouth every 8 (eight) hours as needed for nausea or vomiting. 20 tablet 0   No current facility-administered medications for this visit.     Allergies: Guanfacine; Pineapple; and Other   Past Medical History:  Diagnosis Date  . Anorexia nervosa   . Dyspnea    breathing heavyeven when sitting, lying dpwn,exertion  . GERD (gastroesophageal reflux disease)   . Headache   . History of blood transfusion    prematurity, this was during perinatal period  . Hypertension   . Morbid obesity ([facility]) 04/03/2016  . PCOS (polycystic ovarian syndrome) 04/03/2016  . Reflux     Past Surgical History:  Procedure Laterality Date  . CHOLECYSTECTOMY N/A 11/25/2016   Procedure: LAPAROSCOPIC CHOLECYSTECTOMY;  Surgeon: Ralene Ok, MD;  Location: [facility];  Service: General;  Laterality: N/A;  . ESOPHAGOGASTRODUODENOSCOPY N/A 09/26/2016   Procedure: ESOPHAGOGASTRODUODENOSCOPY (EGD);  Surgeon: Jerene Bears, MD;  Location: [facility];  Service: Endoscopy;  Laterality: N/A;  . eustachion tubes      Family History  Problem Relation Age of Onset  . GER disease Mother   . Hypertension Mother   . GER disease Father   . Hypertension Father   . Lactose intolerance Brother   . Hypertension Maternal Grandfather   . Cancer Neg Hx   . Depression Neg Hx   . Drug abuse Neg Hx   . Early death Neg Hx   . Heart disease Neg Hx   . Hyperlipidemia Neg Hx   . Kidney disease Neg Hx   . Alcohol abuse Neg Hx   . Asthma Neg Hx   . Stroke Neg Hx     Social History   Tobacco Use  .  Smoking status: Never Smoker  . Smokeless tobacco: Never Used  Substance Use Topics  . Alcohol use: No    Subjective:  Patient is seen with her mother today; complaining of persisting and worsening abdominal pain; went to the ER on 3/27 with these symptoms; had CT which showed area of questionable significance but was not found to have appendicitis or bowel obstruction; was told she needed a repeat CT in 48 hours; wondering how this can be done today- questioning if they just walk back in the ER this afternoon; patient notes she was given Bentyl with no benefit; continuing with cramping; pain localized in central abdomen;  states she has been vomiting at least 3x per day but denies being related to anorexia/ bulimia; notes pain is why she is vomiting;  Patient was also started on new ADD medication in the past 24 hours- Guanfacine; had hallucinations on the first night she took it; has contacted that provider and was told to stop immediately;    Objective:  Vitals:   02/04/18 1352  BP: (!) 92/52  Pulse: (!) 106  Temp: 98.9 F (37.2 C)  TempSrc: Oral  SpO2: 99%  Weight: 129 lb 1.9 oz (58.6 kg)  Height: '5\' 4"'$  (1.626 m)    General: Well developed, well nourished, in no acute distress  Skin : Warm and dry.  Head: Normocephalic and atraumatic  Eyes: Sclera and conjunctiva clear; pupils round and reactive to light; extraocular movements intact  Ears: External normal; canals clear; tympanic membranes normal  Oropharynx: Pink, supple. No suspicious lesions  Neck: Supple without thyromegaly, adenopathy  Lungs: Respirations unlabored; clear to auscultation bilaterally without wheeze, rales, rhonchi  CVS exam: normal rate and regular rhythm.  Abdomen: Soft; nontender; nondistended; normoactive bowel sounds; no masses or hepatosplenomegaly  Neurologic: Alert and oriented; speech intact; face symmetrical; moves all extremities well; CNII-XII intact without focal deficit   Assessment:  1. Abdominal pain, unspecified abdominal location   2. Elevated serum creatinine     Plan:  1. Reviewed ER note from 3/27; repeat CT was recommended within 2 days to re-check questionable area on right side of abdomen- felt to be large amount of stool but wanted re-check; patient is seen late on Friday afternoon and can not get outpatient imaging done; she is adamant she does not want to go back to ER; will change Bentyl to Levsin; d/c Omeprazole- try Protonix 40 mg bid; change Zofran to Phenergan; bland diet discussed; continue hydrating; CT will be done on Monday 4/1; strict ER precautions for upcoming weekend; keep planned  follow-up with her PCP on 4/2; will most likely need GI evaluation if no surgical source noted on repeat CT; 2. Creatinine at ER was up from 1.2- 1.69; patient notes she was given one bag of fluids; stat CMP repeated in office;   No follow-ups on file.  Orders Placed This Encounter  Procedures  . CT Abdomen Pelvis W Contrast    Standing Status:   Future    Standing Expiration Date:   05/08/2019    Order Specific Question:   If indicated for the ordered procedure, I authorize the administration of contrast media per Radiology protocol    Answer:   Yes    Order Specific Question:   Is patient pregnant?    Answer:   No    Order Specific Question:   Preferred imaging location?    Answer:   [facility] [employer]    Order Specific Question:   Radiology  Contrast Protocol - do NOT remove file path    Answer:   \\[email or web address].pdf  . Comp Met (CMET)    Standing Status:   Future    Number of Occurrences:   1    Standing Expiration Date:   02/04/2019    Requested Prescriptions   Signed Prescriptions Disp Refills  . promethazine (PHENERGAN) 25 MG tablet 20 tablet 0    Sig: Take 1 tablet (25 mg total) by mouth every 8 (eight) hours as needed for nausea or vomiting.  . pantoprazole (PROTONIX) 40 MG tablet 60 tablet 1    Sig: Take 1 tablet (40 mg total) by mouth 2 (two) times daily.  . hyoscyamine (LEVSIN) 0.125 MG tablet 20 tablet 0    Sig: Take 1 tablet (0.125 mg total) by mouth every 6 (six) hours as needed for cramping.

## 2018-02-06 NOTE — Progress Notes (Signed)
Reviewed with patient and mother at time of office visit; stressed need to keep follow up with her PCP on 4/2; sodium will need to be re-checked; kidney functions are still elevated but improved from 3/27; repeat CT pending.

## 2018-02-07 ENCOUNTER — Other Ambulatory Visit: Payer: Self-pay | Admitting: Family

## 2018-02-07 ENCOUNTER — Ambulatory Visit: Payer: BLUE CROSS/BLUE SHIELD | Admitting: Neurology

## 2018-02-07 ENCOUNTER — Encounter: Payer: Self-pay | Admitting: Neurology

## 2018-02-07 ENCOUNTER — Ambulatory Visit
Admission: RE | Admit: 2018-02-07 | Discharge: 2018-02-07 | Disposition: A | Discharge status: Home / Self Care | Payer: BLUE CROSS/BLUE SHIELD | Source: Ambulatory Visit | Attending: Family | Admitting: Family

## 2018-02-07 VITALS — BP 96/65 | HR 84 | Ht 64.0 in | Wt 125.8 lb

## 2018-02-07 DIAGNOSIS — R55 Syncope and collapse: Secondary | ICD-10-CM

## 2018-02-07 DIAGNOSIS — R7989 Other specified abnormal findings of blood chemistry: Secondary | ICD-10-CM

## 2018-02-07 MED ORDER — NORTRIPTYLINE HCL 10 MG PO CAPS: 20.0000 mg | ORAL_CAPSULE | Freq: Every day | ORAL | 11 refills | Status: DC

## 2018-02-07 MED ORDER — SUMATRIPTAN SUCCINATE 50 MG PO TABS: 50.0000 mg | ORAL_TABLET | ORAL | 6 refills | Status: DC | PRN

## 2018-02-07 NOTE — Progress Notes (Signed)
PATIENT: Stacey Holland DOB: 08-03-1996  Chief Complaint  Patient presents with  . Near Syncope    Orthostatic Vitals:  Lying: 96/65, 84, Sitting: 100/67, 89, Standing: 92/52, 99, Standing x 3 minutes: 102/73, 108.  She is here with her mother, Anselm Pancoast. Reports sensation of feeling warm or cool, being off-balance and blurred vision.  These symptoms usually occur upon standing after she had been sitting or lying down.  She has loss consciousness twice in the past.   . PCP    Etta Grandchild, MD     HISTORICAL  Stacey Holland is a 22 years old female, seen in refer by her primary care physician Dr. Sanda Linger for evaluation of dizziness, initial evaluation was on February 07, 2018.  She is accompanied by her mother Stacey Holland at today's clinical visit  She complains of dizziness since 2017, and only happened stand up quickly from lying down position or prolonged sitting, getting worse since January 19, this also correlate with her most recent onset of eating disorder, she has lost 200 prolonged past 18 months.  Today's examination demonstrate orthostatic tachycardia, heart rate increased to 108 from 84 when changing position from lying down to standing up was no significant blood pressure drop.  Patient reported that increase water intake has helped her symptoms,  CT head without contrast was normal in March 2019,  She also complains of frequent almost daily headache, retro-orbital area pounding headache with associated light noise sensitivity, nauseous,  She complains of difficulty sleeping, tends to leave the TV on before she goes to bed.  Laboratory evaluation March 2019 showed normal CBC hemoglobin of 13.1, CMP showed elevated creatinine 1.25, albumin was decreased 3.4, B12 was normal 340,  REVIEW OF SYSTEMS: Full 14 system review of systems performed and notable only for fatigue, palpitation, spinning sensation, blurred vision, shortness of breath, diarrhea, constipation urination problem,  feeling cold, joint pain, cramps, aching muscles, runny nose, skin sensitivity, memory loss, confusion, headaches, weakness, dizziness, passing out, insomnia, sleepiness, restless leg, depression, anxiety, not enough sleep decreased energy disinterested in activities, racing thoughts.  ALLERGIES: Allergies  Allergen Reactions  . Guanfacine Other (See Comments)    Hallucinations  . Pineapple Other (See Comments)    Reaction:  Burning   . Other Itching, Swelling, Rash and Other (See Comments)    Reaction:  Burning Pt states that she is allergic to all hair dye.      HOME MEDICATIONS: Current Outpatient Medications  Medication Sig Dispense Refill  . cetirizine (ZYRTEC) 10 MG tablet Take 10 mg by mouth daily.    . citalopram (CELEXA) 20 MG tablet Take 1 tablet (20 mg total) by mouth daily. 90 tablet 1  . Dietary Management Product (VAYARIN PLUS) CAPS Take 1 capsule by mouth 2 (two) times daily. 180 capsule 1  . lisdexamfetamine (VYVANSE) 20 MG capsule Take 1 capsule (20 mg total) by mouth daily. 30 capsule 0  . Multiple Vitamin (MULTIVITAMIN) tablet Take 1 tablet by mouth daily.    . Norgestimate-Ethinyl Estradiol Triphasic (TRI-PREVIFEM) 0.18/0.215/0.25 MG-35 MCG tablet Take 1 tablet by mouth at bedtime.    . pantoprazole (PROTONIX) 40 MG tablet Take 1 tablet (40 mg total) by mouth 2 (two) times daily. 60 tablet 1  . potassium chloride SA (K-DUR,KLOR-CON) 20 MEQ tablet Take 1 tablet (20 mEq total) by mouth 3 (three) times daily. 90 tablet 3  . promethazine (PHENERGAN) 25 MG tablet Take 1 tablet (25 mg total) by mouth every 8 (  eight) hours as needed for nausea or vomiting. 20 tablet 0   No current facility-administered medications for this visit.     PAST MEDICAL HISTORY: Past Medical History:  Diagnosis Date  . Anorexia nervosa   . Dyspnea    breathing heavyeven when sitting, lying dpwn,exertion  . GERD (gastroesophageal reflux disease)   . Headache   . History of blood transfusion     prematurity, this was during perinatal period  . Hypertension   . Morbid obesity ([facility]) 04/03/2016  . PCOS (polycystic ovarian syndrome) 04/03/2016  . Reflux   . Syncope     PAST SURGICAL HISTORY: Past Surgical History:  Procedure Laterality Date  . CHOLECYSTECTOMY N/A 11/25/2016   Procedure: LAPAROSCOPIC CHOLECYSTECTOMY;  Surgeon: Axel FillerArmando Ramirez, MD;  [facility];  Service: General;  Laterality: N/A;  . ESOPHAGOGASTRODUODENOSCOPY N/A 09/26/2016   Procedure: ESOPHAGOGASTRODUODENOSCOPY (EGD);  Surgeon: Beverley FiedlerJay M Pyrtle, MD;  [facility];  Service: Endoscopy;  Laterality: N/A;  . eustachion tubes      FAMILY HISTORY: Family History  Problem Relation Age of Onset  . GER disease Mother   . Hypertension Mother   . GER disease Father   . Hypertension Father   . Lactose intolerance Brother   . Hypertension Maternal Grandfather   . Diabetes Maternal Grandfather   . Stroke Maternal Grandfather   . Diabetes Paternal Uncle   . Cancer Neg Hx   . Depression Neg Hx   . Drug abuse Neg Hx   . Early death Neg Hx   . Heart disease Neg Hx   . Hyperlipidemia Neg Hx   . Kidney disease Neg Hx   . Alcohol abuse Neg Hx   . Asthma Neg Hx     SOCIAL HISTORY:  Social History   Socioeconomic History  . Marital status: Single    Spouse name: Not on file  . Number of children: 0  . Years of education: college  . Highest education level: Not on file  Occupational History  . Occupation: Archivistcollege student  Social Needs  . Financial resource strain: Not on file  . Food insecurity:    Worry: Not on file    Inability: Not on file  . Transportation needs:    Medical: Not on file    Non-medical: Not on file  Tobacco Use  . Smoking status: Never Smoker  . Smokeless tobacco: Never Used  Substance and Sexual Activity  . Alcohol use: No  . Drug use: No  . Sexual activity: Never    Birth control/protection: Pill, Abstinence  Lifestyle  . Physical activity:    Days per week: Not  on file    Minutes per session: Not on file  . Stress: Not on file  Relationships  . Social connections:    Talks on phone: Not on file    Gets together: Not on file    Attends religious service: Not on file    Active member of club or organization: Not on file    Attends meetings of clubs or organizations: Not on file    Relationship status: Not on file  . Intimate partner violence:    Fear of current or ex partner: Not on file    Emotionally abused: Not on file    Physically abused: Not on file    Forced sexual activity: Not on file  Other Topics Concern  . 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   Lives at home  with family.   Right-handed.   2 cups caffeine per day.     PHYSICAL EXAM   Vitals:   02/07/18 1323  BP: 96/65  Pulse: 84  Weight: 125 lb 12 oz (57 kg)  Height: 5\' 4"  (1.626 m)    Not recorded      Body mass index is 21.58 kg/m.  PHYSICAL EXAMNIATION:  Gen: NAD, conversant, well nourised, obese, well groomed                     Cardiovascular: Regular rate rhythm, no peripheral edema, warm, nontender. Eyes: Conjunctivae clear without exudates or hemorrhage Neck: Supple, no carotid bruits. Pulmonary: Clear to auscultation bilaterally   NEUROLOGICAL EXAM:  MENTAL STATUS: Speech:    Speech is normal; fluent and spontaneous with normal comprehension.  Cognition:     Orientation to time, place and person     Normal recent and remote memory     Normal Attention span and concentration     Normal Language, naming, repeating,spontaneous speech     Fund of knowledge   CRANIAL NERVES: CN II: Visual fields are full to confrontation. Fundoscopic exam is normal with sharp discs and no vascular changes. Pupils are round equal and briskly reactive to light. CN III, IV, VI: extraocular movement are normal. No ptosis. CN V: Facial sensation is intact to pinprick in all 3 divisions bilaterally. Corneal responses are intact.   CN VII: Face is symmetric with normal eye closure and smile. CN VIII: Hearing is normal to rubbing fingers CN IX, X: Palate elevates symmetrically. Phonation is normal. CN XI: Head turning and shoulder shrug are intact CN XII: Tongue is midline with normal movements and no atrophy.  MOTOR: There is no pronator drift of out-stretched arms. Muscle bulk and tone are normal. Muscle strength is normal.  REFLEXES: Reflexes are 2+ and symmetric at the biceps, triceps, knees, and ankles. Plantar responses are flexor.  SENSORY: Intact to light touch, pinprick, positional sensation and vibratory sensation are intact in fingers and toes.  COORDINATION: Rapid alternating movements and fine finger movements are intact. There is no dysmetria on finger-to-nose and heel-knee-shin.    GAIT/STANCE: Posture is normal. Gait is steady with normal steps, base, arm swing, and turning. Heel and toe walking are normal. Tandem gait is normal.  Romberg is absent.   DIAGNOSTIC DATA (LABS, IMAGING, TESTING) - I reviewed patient records, labs, notes, testing and imaging myself where available.   ASSESSMENT AND PLAN  KITTY CADAVID is a 22 y.o. female    Orthostatic tachycardia, near syncope, Chronic migraine headaches  EEG  Nortriptyline 10 mg titrating up to 20 mg every night as preventive medications  Imitrex 50 mg as needed for migraine  Levert Feinstein, M.D. Ph.D.  [facility] [address] [place], [place] [address] Ph: [phone] Fax: [phone]  CC: Etta Grandchild, MD

## 2018-02-08 ENCOUNTER — Encounter: Payer: Self-pay | Admitting: Internal Medicine

## 2018-02-08 ENCOUNTER — Ambulatory Visit: Payer: BLUE CROSS/BLUE SHIELD | Admitting: Internal Medicine

## 2018-02-08 VITALS — BP 98/60 | HR 80 | Temp 98.8°F | Resp 16 | Ht 64.0 in | Wt 132.0 lb

## 2018-02-08 DIAGNOSIS — F502 Bulimia nervosa: Secondary | ICD-10-CM

## 2018-02-08 DIAGNOSIS — I10 Essential (primary) hypertension: Secondary | ICD-10-CM

## 2018-02-08 MED ORDER — PROMETHAZINE HCL 25 MG PO TABS: 25.0000 mg | ORAL_TABLET | Freq: Three times a day (TID) | ORAL | 1 refills | Status: DC | PRN

## 2018-02-08 NOTE — Patient Instructions (Signed)
Anorexia Nervosa Anorexia nervosa is an eating disorder that results in a lower-than-normal body weight. The disorder usually starts in the teenage years. People with anorexia nervosa are intensely afraid of gaining weight or being fat. They often see themselves as fat even though they may be very thin. In order to prevent weight gain or to lose weight, they engage in unhealthy behaviors, such as:  Starving themselves.  Fasting.  Exercising too much.  Making themselves throw up (vomit) after eating.  These behaviors often interfere with normal life activities. They can lead to serious medical problems and even death. People with anorexia nervosa are at risk for substance abuse and death due to suicide. What are the causes? The cause is unknown, but the disorder probably results from a combination of factors, such as:  Depression.  Other psychological problems.  Peer pressure.  Hormone changes at puberty.  Stress.  What increases the risk? Risk factors include:  Being a teenager.  Being female. Anorexia nervosa can affect males, but it is more common in females.  Having a mental health disorder, such as depression or anxiety.  Having a family member with anorexia nervosa.  What are the signs or symptoms? Signs and symptoms include:  Lower-than-normal body weight.  An intense fear of gaining weight or being fat.  Having a distorted body image.  Basing self-worth on being thin.  Wearing lots of clothes to hide your body.  Being in denial that your low body weight is a problem.  Eating in secret.  Binge eating.  Checking your body several times a day by: ? Looking in a mirror. ? Pinching the skin on your sides. ? Weighing yourself.  Doing things that prevent weight gain, such as: ? Restricting your calorie intake. This may be done by starving yourself, fasting, or exercising too much. ? Making yourself vomit. ? Using too many laxatives. ? Using water pills  inappropriately.  How is this diagnosed? To make a diagnosis, your health care provider will:  Ask about: ? Your thoughts, feelings, and eating habits. ? Your symptoms. ? Your use of medicine, alcohol, or other substances.  Measure your weight and check your vital signs.  Perform a physical exam.  Your health care provider may also order tests or studies to look for health problems. You may be referred to a mental health specialist for evaluation. Once you have been diagnosed, your level of anorexia nervosa will be rated from mild to severe. The rating depends on your degree of weight loss compared to other people of your age, gender, and stage of development. How is this treated? The first goal of treatment is to stabilize medical problems and mental health issues related to the disorder. The type and length of treatment will depend on your specific situation. You may need to be treated at the hospital if you have:  Severe malnutrition.  Dehydration.  An electrolyte imbalance.  An abnormal heart rhythm.  Depression.  Suicidal thoughts.  The second goal of treatment is to restore your body weight to a healthy level. Successful treatment usually requires a combination of:  Counseling with a dietitian.  Counseling or talk therapy with a mental health specialist.A form of talk therapy called cognitive behavioral therapy can be especially helpful. This therapy helps you recognize the thoughts, beliefs, and emotions that lead to unhealthy eating habits and helps you change them.  Medicine. Some people with severe anorexia nervosa may benefit from certain medicines that promote weight gain. Antidepressant medicines can  help with symptoms of depression and anxiety.  Follow these instructions at home:  Keep all follow-up visits as directed by your health care provider. This is important.  Follow a meal plan as directed by your health care provider.  Avoid checking your  weight.  Avoid isolating yourself from your family and friends.  Take medicines only as directed by your health care provider. Where to find more information: National Eating Disorders Association (NEDA): www.nationaleatingdisorders.org Contact a health care provider if:  Your symptoms get worse.  You start having new symptoms. Get help right away if:  You have serious thoughts about hurting yourself or someone else.  You collapse.  You have chest pain.  You vomit blood. This information is not intended to replace advice given to you by your health care provider. Make sure you discuss any questions you have with your health care provider. Document Released: 10/23/2000 Document Revised: 04/02/2016 Document Reviewed: 02/22/2014 Elsevier Interactive Patient Education  [employer]2018 [employer] Inc[employer]

## 2018-02-08 NOTE — Progress Notes (Signed)
Subjective:  Patient ID: Stacey Holland, female    DOB: 1996/02/13  Age: 22 y.o. MRN: [id number]  CC: Follow-up   HPI Stacey Holland presents for f/up - She has seen several other providers since I last saw her.  She appears to be more accepting of her eating disorder.  She now admits today that she forces herself to vomit up food.  She said when she eats or drinks she just has a sensation that it does not settle well in her stomach so she forces herself to throw it up.  She and her mother are both willing for her to be admitted to an eating disorder center.  She knows that her symptoms are related to dehydration and elevated heart rate.  She saw a neurologist 1 day prior to this visit and is been treated for neurological causes of syncope.  She offers no new complaints today.  She has gained about 3 pounds since I last saw her.  She tells me that promethazine helps her keep the food and liquids down.  Outpatient Medications Prior to Visit  Medication Sig Dispense Refill  . cetirizine (ZYRTEC) 10 MG tablet Take 10 mg by mouth daily.    . citalopram (CELEXA) 20 MG tablet Take 1 tablet (20 mg total) by mouth daily. 90 tablet 1  . Dietary Management Product (VAYARIN PLUS) CAPS Take 1 capsule by mouth 2 (two) times daily. 180 capsule 1  . lisdexamfetamine (VYVANSE) 20 MG capsule Take 1 capsule (20 mg total) by mouth daily. 30 capsule 0  . Multiple Vitamin (MULTIVITAMIN) tablet Take 1 tablet by mouth daily.    . Norgestimate-Ethinyl Estradiol Triphasic (TRI-PREVIFEM) 0.18/0.215/0.25 MG-35 MCG tablet Take 1 tablet by mouth at bedtime.    . nortriptyline (PAMELOR) 10 MG capsule Take 2 capsules (20 mg total) by mouth at bedtime. 60 capsule 11  . pantoprazole (PROTONIX) 40 MG tablet Take 1 tablet (40 mg total) by mouth 2 (two) times daily. 60 tablet 1  . potassium chloride SA (K-DUR,KLOR-CON) 20 MEQ tablet Take 1 tablet (20 mEq total) by mouth 3 (three) times daily. 90 tablet 3  . SUMAtriptan  (IMITREX) 50 MG tablet Take 1 tablet (50 mg total) by mouth every 2 (two) hours as needed for migraine. May repeat in 2 hours if headache persists or recurs. 12 tablet 6  . promethazine (PHENERGAN) 25 MG tablet Take 1 tablet (25 mg total) by mouth every 8 (eight) hours as needed for nausea or vomiting. 20 tablet 0   No facility-administered medications prior to visit.     ROS Review of Systems  Constitutional: Negative for diaphoresis, fatigue and unexpected weight change.  HENT: Negative.   Eyes: Negative for visual disturbance.  Respiratory: Negative for cough, chest tightness, shortness of breath and wheezing.   Cardiovascular: Negative for chest pain, palpitations and leg swelling.  Gastrointestinal: Positive for vomiting. Negative for abdominal pain, diarrhea and nausea.  Endocrine: Negative.   Genitourinary: Negative.  Negative for decreased urine volume, difficulty urinating and urgency.  Musculoskeletal: Negative.   Skin: Negative.     Objective:  BP 98/60 (BP Location: Right Arm, Patient Position: Sitting, Cuff Size: Normal)   Pulse 80   Temp 98.8 F (37.1 C) (Oral)   Resp 16   Ht 5\' 4"  (1.626 m)   Wt 132 lb (59.9 kg)   LMP 01/20/2018   SpO2 94%   BMI 22.66 kg/m   BP Readings from Last 3 Encounters:  02/08/18 98/60  02/07/18  96/65  02/04/18 (!) 92/52    Wt Readings from Last 3 Encounters:  02/08/18 132 lb (59.9 kg)  02/07/18 125 lb 12 oz (57 kg)  02/04/18 129 lb 1.9 oz (58.6 kg)    Physical Exam  Constitutional: She is oriented to person, place, and time. No distress.  HENT:  Mouth/Throat: Oropharynx is clear and moist. No oropharyngeal exudate.  Eyes: Conjunctivae are normal. Left eye exhibits no discharge. No scleral icterus.  Neck: Normal range of motion. Neck supple. No JVD present. No thyromegaly present.  Cardiovascular: Normal rate, regular rhythm and normal heart sounds. Exam reveals no gallop.  No murmur heard. Pulmonary/Chest: Effort normal and  breath sounds normal. No respiratory distress. She has no wheezes. She has no rales.  Abdominal: Soft. Bowel sounds are normal. She exhibits no distension and no mass. There is no tenderness. There is no guarding.  Musculoskeletal: Normal range of motion. She exhibits no edema, tenderness or deformity.  Lymphadenopathy:    She has no cervical adenopathy.  Neurological: She is alert and oriented to person, place, and time.  Skin: Skin is warm and dry. No rash noted. She is not diaphoretic. No erythema. No pallor.  Vitals reviewed.   Lab Results  Component Value Date   WBC 5.0 02/02/2018   HGB 13.1 02/02/2018   HCT 39.2 02/02/2018   PLT 331 02/02/2018   GLUCOSE 95 02/04/2018   CHOL 196 07/21/2016   TRIG 209 (H) 07/21/2016   HDL 39 (L) 07/21/2016   LDLCALC 115 (H) 07/21/2016   ALT 13 02/04/2018   AST 16 02/04/2018   NA 130 (L) 02/04/2018   K 4.8 02/04/2018   CL 97 02/04/2018   CREATININE 1.25 (H) 02/04/2018   BUN 22 02/04/2018   CO2 28 02/04/2018   TSH 0.58 12/01/2017   HGBA1C 5.2 07/27/2016    No results found.  Assessment & Plan:   Stacey Holland was seen today for follow-up.  Diagnoses and all orders for this visit:  Essential hypertension- Her blood pressure is soft.  Antihypertensive therapy is not indicated.  Vomiting associated with bulimia nervosa with nausea- She and her mother have a better acceptance of the patient's diagnosis.  I gave them the phone number and [facility] and have asked them to take her there to be seen.  In the meantime she will continue taking promethazine as needed to prevent dehydration and weight loss. -     promethazine (PHENERGAN) 25 MG tablet; Take 1 tablet (25 mg total) by mouth every 8 (eight) hours as needed for nausea or vomiting.   I am having Stacey Holland maintain her Norgestimate-Ethinyl Estradiol Triphasic, multivitamin, citalopram, VAYARIN PLUS, potassium chloride SA,  lisdexamfetamine, cetirizine, pantoprazole, nortriptyline, SUMAtriptan, and promethazine.  Meds ordered this encounter  Medications  . promethazine (PHENERGAN) 25 MG tablet    Sig: Take 1 tablet (25 mg total) by mouth every 8 (eight) hours as needed for nausea or vomiting.    Dispense:  35 tablet    Refill:  1     Follow-up: Return in about 2 months (around 04/10/2018).  Sanda Lingerhomas Maiko Salais, MD

## 2018-02-10 ENCOUNTER — Ambulatory Visit (HOSPITAL_COMMUNITY): Payer: BLUE CROSS/BLUE SHIELD | Attending: Cardiology

## 2018-02-10 ENCOUNTER — Other Ambulatory Visit: Payer: Self-pay

## 2018-02-10 DIAGNOSIS — Z6822 Body mass index (BMI) 22.0-22.9, adult: Secondary | ICD-10-CM | POA: Insufficient documentation

## 2018-02-10 DIAGNOSIS — R0600 Dyspnea, unspecified: Secondary | ICD-10-CM | POA: Diagnosis not present

## 2018-02-10 DIAGNOSIS — R55 Syncope and collapse: Secondary | ICD-10-CM | POA: Insufficient documentation

## 2018-02-10 DIAGNOSIS — R630 Anorexia: Secondary | ICD-10-CM | POA: Diagnosis not present

## 2018-02-16 ENCOUNTER — Ambulatory Visit
Admission: RE | Admit: 2018-02-16 | Discharge: 2018-02-16 | Disposition: A | Discharge status: Home / Self Care | Payer: BLUE CROSS/BLUE SHIELD | Source: Ambulatory Visit | Attending: Family | Admitting: Family

## 2018-02-16 ENCOUNTER — Other Ambulatory Visit: Payer: Self-pay | Admitting: Internal Medicine

## 2018-02-16 DIAGNOSIS — R41840 Attention and concentration deficit: Secondary | ICD-10-CM

## 2018-02-16 MED ORDER — IOPAMIDOL (ISOVUE-300) INJECTION 61%
100.0000 mL | Freq: Once | INTRAVENOUS | Status: AC | PRN
  Administered 2018-02-16: 100 mL via INTRAVENOUS

## 2018-02-17 ENCOUNTER — Encounter: Payer: Self-pay | Admitting: Internal Medicine

## 2018-02-18 ENCOUNTER — Telehealth: Payer: Self-pay

## 2018-02-18 NOTE — Progress Notes (Signed)
Spoke with patient. She completed paperwork on line yesterday. Wanted to know if you could still do referral for her to possibly move process along and get her in ASAP.  Thanks

## 2018-02-18 NOTE — Telephone Encounter (Signed)
Routing to dr jones, please advise, thanks 

## 2018-02-18 NOTE — Telephone Encounter (Signed)
Spoke with patient today. She is requesting to have her a refill on her Promethazine. She was wanting to know could the dosage be increase or quantity increased? She takes 2 of the 25 mg in order for it to work. She would also like a refill on her linzess also. Please call patient back to advise.

## 2018-02-21 NOTE — Telephone Encounter (Signed)
No, I am not okay with a dose increase

## 2018-02-22 NOTE — Telephone Encounter (Signed)
Patient advised of dr Yetta Barrejones notes--cannot increase promethazine---patient is also requesting refill on linzess----this med is not currently on med list, she used to take it but has been off for awhile, would like to restart med---routing to dr Yetta Barrejones, are you ok with sending linzess in for patient?  Please advise, I will call patient back, thanks

## 2018-02-26 ENCOUNTER — Other Ambulatory Visit: Payer: Self-pay | Admitting: Family

## 2018-02-28 ENCOUNTER — Other Ambulatory Visit: Payer: Self-pay | Admitting: Internal Medicine

## 2018-02-28 DIAGNOSIS — E876 Hypokalemia: Secondary | ICD-10-CM

## 2018-03-01 ENCOUNTER — Other Ambulatory Visit: Payer: Self-pay | Admitting: *Deleted

## 2018-03-01 ENCOUNTER — Encounter: Payer: Self-pay | Admitting: Internal Medicine

## 2018-03-01 MED ORDER — NORTRIPTYLINE HCL 10 MG PO CAPS: 20.0000 mg | ORAL_CAPSULE | Freq: Every day | ORAL | 3 refills | Status: DC

## 2018-03-03 ENCOUNTER — Other Ambulatory Visit: Payer: Self-pay | Admitting: *Deleted

## 2018-03-03 MED ORDER — NORTRIPTYLINE HCL 10 MG PO CAPS: 20.0000 mg | ORAL_CAPSULE | Freq: Every day | ORAL | 3 refills | Status: DC

## 2018-03-04 ENCOUNTER — Ambulatory Visit: Payer: BLUE CROSS/BLUE SHIELD | Admitting: Family

## 2018-03-04 ENCOUNTER — Other Ambulatory Visit: Payer: BLUE CROSS/BLUE SHIELD

## 2018-03-09 ENCOUNTER — Encounter: Payer: Self-pay | Admitting: Internal Medicine

## 2018-03-09 ENCOUNTER — Ambulatory Visit: Payer: Self-pay

## 2018-03-09 ENCOUNTER — Encounter: Payer: Self-pay | Admitting: Family

## 2018-03-09 ENCOUNTER — Ambulatory Visit: Payer: BLUE CROSS/BLUE SHIELD | Admitting: Family

## 2018-03-09 ENCOUNTER — Telehealth: Payer: Self-pay | Admitting: Family

## 2018-03-09 ENCOUNTER — Other Ambulatory Visit (INDEPENDENT_AMBULATORY_CARE_PROVIDER_SITE_OTHER): Payer: BLUE CROSS/BLUE SHIELD

## 2018-03-09 VITALS — BP 108/78 | HR 98 | Temp 99.0°F | Ht 64.0 in | Wt 131.0 lb

## 2018-03-09 DIAGNOSIS — R899 Unspecified abnormal finding in specimens from other organs, systems and tissues: Secondary | ICD-10-CM

## 2018-03-09 DIAGNOSIS — K219 Gastro-esophageal reflux disease without esophagitis: Secondary | ICD-10-CM

## 2018-03-09 DIAGNOSIS — K5904 Chronic idiopathic constipation: Secondary | ICD-10-CM

## 2018-03-09 DIAGNOSIS — J309 Allergic rhinitis, unspecified: Secondary | ICD-10-CM | POA: Diagnosis not present

## 2018-03-09 LAB — COMPREHENSIVE METABOLIC PANEL
ALT: 13 U/L (ref 0–35)
AST: 18 U/L (ref 0–37)
Albumin: 3.8 g/dL (ref 3.5–5.2)
Alkaline Phosphatase: 46 U/L (ref 39–117)
BUN: 16 mg/dL (ref 6–23)
CHLORIDE: 98 meq/L (ref 96–112)
CO2: 29 meq/L (ref 19–32)
CREATININE: 0.92 mg/dL (ref 0.40–1.20)
Calcium: 9.3 mg/dL (ref 8.4–10.5)
GFR: 80.94 mL/min (ref >60.00)
Glucose, Bld: 99 mg/dL (ref 70–99)
Potassium: 3.2 mEq/L — ABNORMAL LOW (ref 3.5–5.1)
SODIUM: 137 meq/L (ref 135–145)
Total Bilirubin: 0.3 mg/dL (ref 0.2–1.2)
Total Protein: 7.1 g/dL (ref 6.0–8.3)

## 2018-03-09 MED ORDER — OMEPRAZOLE 20 MG PO CPDR: 20.0000 mg | DELAYED_RELEASE_CAPSULE | Freq: Every day | ORAL | 1 refills | Status: DC

## 2018-03-09 MED ORDER — FLUTICASONE PROPIONATE 50 MCG/ACT NA SUSP: 2.0000 | Freq: Every day | NASAL | 6 refills | Status: DC

## 2018-03-09 MED ORDER — LINACLOTIDE 72 MCG PO CAPS: 72.0000 ug | ORAL_CAPSULE | Freq: Every day | ORAL | 0 refills | Status: DC

## 2018-03-09 NOTE — Telephone Encounter (Signed)
Patient called and asks is it a good idea to fast during Ramadan, which is part of her religion, where there is no eating or drinking from sunrise to sunset. She says with her medical history, she wants to know if it is a good idea. She says she's not asking to say fast or don't fast, just with her medical history of low blood sugar, dehydration, etc she's wondering if she should or not and is seeking some advice. I advised that this would be sent to the provider for review and someone will call with his recommendation, she verbalized understanding.    Reason for Disposition . [1] Caller requesting NON-URGENT health information AND [2] PCP's office is the best resource  Answer Assessment - Initial Assessment Questions 1. REASON FOR CALL or QUESTION: "What is your reason for calling today?" or "How can I best help you?" or "What question do you have that I can help answer?"     Should I fast with my medical history  Protocols used: INFORMATION ONLY CALL-A-AH

## 2018-03-09 NOTE — Telephone Encounter (Signed)
Called and left message for patient to return call to clinic to discuss further concerns. CRM created incase she calls back.

## 2018-03-09 NOTE — Progress Notes (Signed)
Stacey Holland is a 22 y.o. female with the following history as recorded in Epic[employer]:  Patient Active Problem List   Diagnosis Date Noted  . Syncope 02/01/2018  . Anorexia nervosa   . Eating disorder 12/01/2017  . Attention and concentration deficit 12/01/2017  . Chronic idiopathic constipation 12/01/2017  . Vomiting associated with bulimia nervosa with nausea 06/22/2017  . Hypomagnesemia 09/25/2016  . Hypokalemia 09/18/2016  . Routine general medical examination at a health care facility 08/26/2016  . Atypical lymphocytosis 08/25/2016  . PCOS (polycystic ovarian syndrome) 04/03/2016  . Essential hypertension 02/20/2015  . GE reflux     Current Outpatient Medications  Medication Sig Dispense Refill  . citalopram (CELEXA) 20 MG tablet Take 1 tablet (20 mg total) by mouth daily. 90 tablet 1  . lisdexamfetamine (VYVANSE) 20 MG capsule Take 1 capsule (20 mg total) by mouth daily. 30 capsule 0  . Multiple Vitamin (MULTIVITAMIN) tablet Take 1 tablet by mouth daily.    . Norgestimate-Ethinyl Estradiol Triphasic (TRI-PREVIFEM) 0.18/0.215/0.25 MG-35 MCG tablet Take 1 tablet by mouth at bedtime.    . potassium chloride SA (K-DUR,KLOR-CON) 20 MEQ tablet Take 1 tablet (20 mEq total) by mouth 3 (three) times daily. 90 tablet 3  . promethazine (PHENERGAN) 25 MG tablet Take 1 tablet (25 mg total) by mouth every 8 (eight) hours as needed for nausea or vomiting. 35 tablet 1  . SUMAtriptan (IMITREX) 50 MG tablet Take 1 tablet (50 mg total) by mouth every 2 (two) hours as needed for migraine. May repeat in 2 hours if headache persists or recurs. 12 tablet 6  . fluticasone (FLONASE) 50 MCG/ACT nasal spray Place 2 sprays into both nostrils daily. 16 g 6  . linaclotide (LINZESS) 72 MCG capsule Take 1 capsule (72 mcg total) by mouth daily before breakfast. 90 capsule 0  . omeprazole (PRILOSEC) 20 MG capsule Take 1 capsule (20 mg total) by mouth daily. 90 capsule 1   No current facility-administered  medications for this visit.     Allergies: Guanfacine; Pineapple; and Other  Past Medical History:  Diagnosis Date  . Anorexia nervosa   . Dyspnea    breathing heavyeven when sitting, lying dpwn,exertion  . GERD (gastroesophageal reflux disease)   . Headache   . History of blood transfusion    prematurity, this was during perinatal period  . Hypertension   . Morbid obesity ([place]) 04/03/2016  . PCOS (polycystic ovarian syndrome) 04/03/2016  . Reflux   . Syncope     Past Surgical History:  Procedure Laterality Date  . CHOLECYSTECTOMY N/A 11/25/2016   Procedure: LAPAROSCOPIC CHOLECYSTECTOMY;  Surgeon: Ralene Ok, MD;  Location: [place];  Service: General;  Laterality: N/A;  . ESOPHAGOGASTRODUODENOSCOPY N/A 09/26/2016   Procedure: ESOPHAGOGASTRODUODENOSCOPY (EGD);  Surgeon: Jerene Bears, MD;  Location: [facility];  Service: Endoscopy;  Laterality: N/A;  . eustachion tubes      Family History  Problem Relation Age of Onset  . GER disease Mother   . Hypertension Mother   . GER disease Father   . Hypertension Father   . Lactose intolerance Brother   . Hypertension Maternal Grandfather   . Diabetes Maternal Grandfather   . Stroke Maternal Grandfather   . Diabetes Paternal Uncle   . Cancer Neg Hx   . Depression Neg Hx   . Drug abuse Neg Hx   . Early death Neg Hx   . Heart disease Neg Hx   . Hyperlipidemia Neg Hx   . Kidney disease  Neg Hx   . Alcohol abuse Neg Hx   . Asthma Neg Hx     Social History   Tobacco Use  . Smoking status: Never Smoker  . Smokeless tobacco: Never Used  Substance Use Topics  . Alcohol use: No    Subjective:  Patient presents to repeat her labs to follow-up on low sodium/ elevated creatinine; has been feeling much better recently- feels like she has had the "right balance." Has not had an episode of syncope in the past 3-4 weeks; planning to see eating disorder clinic at [facility] on 03/18/2018;  Mentions frontal headache x 2-3 days; no prior history  of seasonal allergies; denies any concerns for excess stress; not currently taking the Nortriptyline prescribed by neurology- did not like how she felt on this medication; Requesting referral back to GI- continuing to struggle with chronic constipation and feels like food moves "slowly" through her GI tract.   Objective:  Vitals:   03/09/18 1407  BP: 108/78  Pulse: 98  Temp: 99 F (37.2 C)  TempSrc: Oral  SpO2: 98%  Weight: 131 lb (59.4 kg)  Height: '5\' 4"'$  (1.626 m)    General: Well developed, well nourished, in no acute distress  Skin : Warm and dry.  Head: Normocephalic and atraumatic  Eyes: Sclera and conjunctiva clear; pupils round and reactive to light; extraocular movements intact  Ears: External normal; canals clear; tympanic membranes normal  Oropharynx: Pink, supple. No suspicious lesions  Neck: Supple without thyromegaly, adenopathy  Lungs: Respirations unlabored; clear to auscultation bilaterally without wheeze, rales, rhonchi  Vessels: Symmetric bilaterally  Neurologic: Alert and oriented; speech intact; face symmetrical; moves all extremities well; CNII-XII intact without focal deficit   Assessment:  1. Abnormal laboratory test result   2. Chronic idiopathic constipation   3. Gastroesophageal reflux disease without esophagitis   4. Allergic rhinitis, unspecified seasonality, unspecified trigger     Plan:  1. Update CMP today; keep planned appt at [facility] on 5/10 with eating disorder clinic; 2. Refill given on Linzess; refer back to GI; 3. Refill on Omeprazole; refer back to GI; 4. Rx for Flonase- use as directed;   No follow-ups on file.  Orders Placed This Encounter  Procedures  . Comp Met (CMET)    Standing Status:   Future    Number of Occurrences:   1    Standing Expiration Date:   03/09/2019  . Ambulatory referral to Gastroenterology    Referral Priority:   Routine    Referral Type:   Consultation    Referral Reason:   Specialty Services Required    Number  of Visits Requested:   1    Requested Prescriptions   Signed Prescriptions Disp Refills  . linaclotide (LINZESS) 72 MCG capsule 90 capsule 0    Sig: Take 1 capsule (72 mcg total) by mouth daily before breakfast.  . omeprazole (PRILOSEC) 20 MG capsule 90 capsule 1    Sig: Take 1 capsule (20 mg total) by mouth daily.  . fluticasone (FLONASE) 50 MCG/ACT nasal spray 16 g 6    Sig: Place 2 sprays into both nostrils daily.

## 2018-03-09 NOTE — Telephone Encounter (Signed)
Copied from CRM [id number]. Topic: Quick Communication - See Telephone Encounter >> Mar 09, 2018  3:20 PM Louie Bun, Rosey Bath D wrote: CRM for notification. See Telephone encounter for: 03/09/18. Patient called and said she would like to talk to Ria Clock about something she forgot to ask her at todays' appt. Please call patient back, thanks.

## 2018-03-10 ENCOUNTER — Institutional Professional Consult (permissible substitution): Payer: BLUE CROSS/BLUE SHIELD | Admitting: Internal Medicine

## 2018-03-10 ENCOUNTER — Encounter

## 2018-03-10 NOTE — Telephone Encounter (Signed)
Pt has sent a mychart message regarding same and that message has been sent to PCP for review and recommendation. I am closing this note.

## 2018-03-14 ENCOUNTER — Ambulatory Visit: Payer: BLUE CROSS/BLUE SHIELD | Admitting: Internal Medicine

## 2018-03-14 ENCOUNTER — Encounter: Payer: Self-pay | Admitting: Internal Medicine

## 2018-03-14 ENCOUNTER — Ambulatory Visit (INDEPENDENT_AMBULATORY_CARE_PROVIDER_SITE_OTHER)
Admission: RE | Admit: 2018-03-14 | Discharge: 2018-03-14 | Disposition: A | Discharge status: Home / Self Care | Payer: BLUE CROSS/BLUE SHIELD | Source: Ambulatory Visit | Attending: Internal Medicine | Admitting: Internal Medicine

## 2018-03-14 VITALS — BP 110/80 | HR 102 | Temp 98.9°F | Resp 16 | Ht 64.0 in | Wt 132.0 lb

## 2018-03-14 DIAGNOSIS — R05 Cough: Secondary | ICD-10-CM

## 2018-03-14 DIAGNOSIS — J069 Acute upper respiratory infection, unspecified: Secondary | ICD-10-CM

## 2018-03-14 DIAGNOSIS — B9789 Other viral agents as the cause of diseases classified elsewhere: Secondary | ICD-10-CM | POA: Diagnosis not present

## 2018-03-14 DIAGNOSIS — J029 Acute pharyngitis, unspecified: Secondary | ICD-10-CM | POA: Diagnosis not present

## 2018-03-14 DIAGNOSIS — R059 Cough, unspecified: Secondary | ICD-10-CM

## 2018-03-14 LAB — POCT RAPID STREP A (OFFICE): RAPID STREP A SCREEN: NEGATIVE

## 2018-03-14 LAB — POCT EXHALED NITRIC OXIDE: FeNO level (ppb): 6

## 2018-03-14 MED ORDER — HYDROCODONE-HOMATROPINE 5-1.5 MG/5ML PO SYRP: 5.0000 mL | ORAL_SOLUTION | Freq: Three times a day (TID) | ORAL | 0 refills | Status: DC | PRN

## 2018-03-14 NOTE — Patient Instructions (Signed)
Cough, Adult  Coughing is a reflex that clears your throat and your airways. Coughing helps to heal and protect your lungs. It is normal to cough occasionally, but a cough that happens with other symptoms or lasts a long time may be a sign of a condition that needs treatment. A cough may last only 2-3 weeks (acute), or it may last longer than 8 weeks (chronic).  What are the causes?  Coughing is commonly caused by:   Breathing in substances that irritate your lungs.   A viral or bacterial respiratory infection.   Allergies.   Asthma.   Postnasal drip.   Smoking.   Acid backing up from the stomach into the esophagus (gastroesophageal reflux).   Certain medicines.   Chronic lung problems, including COPD (or rarely, lung cancer).   Other medical conditions such as heart failure.    Follow these instructions at home:  Pay attention to any changes in your symptoms. Take these actions to help with your discomfort:   Take medicines only as told by your health care provider.  ? If you were prescribed an antibiotic medicine, take it as told by your health care provider. Do not stop taking the antibiotic even if you start to feel better.  ? Talk with your health care provider before you take a cough suppressant medicine.   Drink enough fluid to keep your urine clear or pale yellow.   If the air is dry, use a cold steam vaporizer or humidifier in your bedroom or your home to help loosen secretions.   Avoid anything that causes you to cough at work or at home.   If your cough is worse at night, try sleeping in a semi-upright position.   Avoid cigarette smoke. If you smoke, quit smoking. If you need help quitting, ask your health care provider.   Avoid caffeine.   Avoid alcohol.   Rest as needed.    Contact a health care provider if:   You have new symptoms.   You cough up pus.   Your cough does not get better after 2-3 weeks, or your cough gets worse.   You cannot control your cough with suppressant  medicines and you are losing sleep.   You develop pain that is getting worse or pain that is not controlled with pain medicines.   You have a fever.   You have unexplained weight loss.   You have night sweats.  Get help right away if:   You cough up blood.   You have difficulty breathing.   Your heartbeat is very fast.  This information is not intended to replace advice given to you by your health care provider. Make sure you discuss any questions you have with your health care provider.  Document Released: 04/24/2011 Document Revised: 04/02/2016 Document Reviewed: 01/02/2015  Elsevier Interactive Patient Education  2018 Elsevier Inc[employer]

## 2018-03-14 NOTE — Progress Notes (Signed)
Subjective:  Patient ID: Stacey Holland, female    DOB: July 19, 1996  Age: 22 y.o. MRN: [id number]  CC: Cough   HPI Stacey Holland presents for a 2 day hx of ST and NP cough, her aunt is also ill and was recently prescribed Tessalon Perles.  The patient took a few doses of her Aunt's [employer] and says it did not help.  She denies shortness of breath, chest pain, hemoptysis, fever, chills, wheezing, or night sweats.  Outpatient Medications Prior to Visit  Medication Sig Dispense Refill  . citalopram (CELEXA) 20 MG tablet Take 1 tablet (20 mg total) by mouth daily. 90 tablet 1  . fluticasone (FLONASE) 50 MCG/ACT nasal spray Place 2 sprays into both nostrils daily. 16 g 6  . linaclotide (LINZESS) 72 MCG capsule Take 1 capsule (72 mcg total) by mouth daily before breakfast. 90 capsule 0  . lisdexamfetamine (VYVANSE) 20 MG capsule Take 1 capsule (20 mg total) by mouth daily. 30 capsule 0  . Multiple Vitamin (MULTIVITAMIN) tablet Take 1 tablet by mouth daily.    . Norgestimate-Ethinyl Estradiol Triphasic (TRI-PREVIFEM) 0.18/0.215/0.25 MG-35 MCG tablet Take 1 tablet by mouth at bedtime.    [name] omeprazole (PRILOSEC) 20 MG capsule Take 1 capsule (20 mg total) by mouth daily. 90 capsule 1  . potassium chloride SA (K-DUR,KLOR-CON) 20 MEQ tablet Take 1 tablet (20 mEq total) by mouth 3 (three) times daily. 90 tablet 3  . promethazine (PHENERGAN) 25 MG tablet Take 1 tablet (25 mg total) by mouth every 8 (eight) hours as needed for nausea or vomiting. 35 tablet 1  . SUMAtriptan (IMITREX) 50 MG tablet Take 1 tablet (50 mg total) by mouth every 2 (two) hours as needed for migraine. May repeat in 2 hours if headache persists or recurs. 12 tablet 6   No facility-administered medications prior to visit.     ROS Review of Systems  Constitutional: Negative for chills, diaphoresis, fatigue and fever.  HENT: Positive for sore throat. Negative for facial swelling, sinus pressure and trouble swallowing.     Respiratory: Positive for cough. Negative for chest tightness, shortness of breath and wheezing.   Cardiovascular: Negative.  Negative for chest pain, palpitations and leg swelling.  Gastrointestinal: Negative for abdominal pain, constipation, diarrhea, nausea and vomiting.  Genitourinary: Negative.  Negative for difficulty urinating.  Musculoskeletal: Negative.  Negative for arthralgias, myalgias and neck pain.  Skin: Negative for color change and rash.  Neurological: Negative.  Negative for dizziness, weakness and light-headedness.  Hematological: Negative for adenopathy. Does not bruise/bleed easily.  Psychiatric/Behavioral: Negative.     Objective:  BP 110/80 (BP Location: Left Arm, Patient Position: Sitting, Cuff Size: Normal)   Pulse (!) 102   Temp 98.9 F (37.2 C) (Oral)   Resp 16   Ht  (1.626 m)   Wt 132 lb (59.9 kg)   LMP 02/20/2018   SpO2 99%   BMI 22.66 kg/m   BP Readings from Last 3 Encounters:  03/14/18 110/80  03/09/18 108/78  02/08/18 98/60    Wt Readings from Last 3 Encounters:  03/14/18 132 lb (59.9 kg)  03/09/18 131 lb (59.4 kg)  02/08/18 132 lb (59.9 kg)    Physical Exam  Constitutional: She is oriented to person, place, and time. No distress.  HENT:  Mouth/Throat: Oropharynx is clear and moist. No oropharyngeal exudate.  Eyes: Conjunctivae are normal. No scleral icterus.  Neck: Normal range of motion. Neck supple. No JVD present. No thyromegaly present.  Cardiovascular: Normal rate, regular rhythm and normal heart sounds. Exam reveals no gallop and no friction rub.  No murmur heard. Pulmonary/Chest: Effort normal and breath sounds normal. No stridor. No respiratory distress. She has no wheezes. She has no rales.  Abdominal: Soft. Bowel sounds are normal. She exhibits no distension and no mass. There is no tenderness.  Musculoskeletal: Normal range of motion. She exhibits no edema, tenderness or deformity.  Lymphadenopathy:    She has no  cervical adenopathy.  Neurological: She is alert and oriented to person, place, and time.  Skin: Skin is warm and dry. She is not diaphoretic. No erythema. No pallor.  Vitals reviewed.   Lab Results  Component Value Date   WBC 5.0 02/02/2018   HGB 13.1 02/02/2018   HCT 39.2 02/02/2018   PLT 331 02/02/2018   GLUCOSE 99 03/09/2018   CHOL 196 07/21/2016   TRIG 209 (H) 07/21/2016   HDL 39 (L) 07/21/2016   LDLCALC 115 (H) 07/21/2016   ALT 13 03/09/2018   AST 18 03/09/2018   NA 137 03/09/2018   K 3.2 (L) 03/09/2018   CL 98 03/09/2018   CREATININE 0.92 03/09/2018   BUN 16 03/09/2018   CO2 29 03/09/2018   TSH 0.58 12/01/2017   HGBA1C 5.2 07/27/2016    Ct Abdomen Pelvis W Contrast  Result Date: 02/16/2018 CLINICAL DATA:  Generalized abdominal pain. EXAM: CT ABDOMEN AND PELVIS WITH CONTRAST TECHNIQUE: Multidetector CT imaging of the abdomen and pelvis was performed using the standard protocol following bolus administration of intravenous contrast. Oral contrast was also administered. CONTRAST:  ISOVUE-300 IOPAMIDOL (ISOVUE-300) INJECTION 61% COMPARISON:  February 02, 2018 FINDINGS: Lower chest: Lung bases are clear. Hepatobiliary: No focal liver lesions are evident. Gallbladder is absent. There is no biliary duct dilatation. Pancreas: No pancreatic mass or inflammatory focus. Spleen: No splenic lesions are evident. Adrenals/Urinary Tract: Adrenals bilaterally appear normal. Kidneys bilaterally show no evident mass or hydronephrosis on either side. There is no renal or ureteral calculus on either side. Urinary bladder is midline with wall thickness within normal limits. Stomach/Bowel: There is a degree of wall thickening in the rectum with mild soft tissue stranding adjacent to the rectum. No rectal/perirectal abscess or mass. There is fairly diffuse stool throughout much of the colon. There is no longer dilatation in the region of the cecum. Beyond the rectum, there is no bowel wall or  mesenteric thickening. No evident bowel obstruction. No free air or portal venous air. A small amount of oral contrast in the distal esophagus may indicate spontaneous gastroesophageal reflux. Vascular/Lymphatic: No abdominal aortic aneurysm. No vascular lesions are evident. There is no appreciable adenopathy in the abdomen or pelvis. Reproductive: Uterus is anteverted.  No pelvic mass evident. Other: Appendix appears normal. No abscess or ascites evident in the abdomen or pelvis. Musculoskeletal: No blastic or lytic bone lesions. No intramuscular lesions. There is scarring in the region of the umbilicus. IMPRESSION: 1. Suspect a degree of proctitis. No rectal/perirectal abscess or mass. 2. No bowel obstruction. No abscess in the abdomen or pelvis. Appendix appears normal. 3.  No renal or ureteral calculus.  No hydronephrosis. 4.  Gallbladder absent. Electronically Signed   By: Bretta Bang III M.D.   On: 02/16/2018 11:50    Assessment & Plan:   Lynnell Grain was seen today for cough.  Diagnoses and all orders for this visit:  Cough- Her FeNO score is low which is reassuring that she does not have an allergic or eosinophilic condition.  Her chest x-ray is negative for mass or infiltrate. -     POCT EXHALED NITRIC OXIDE -     DG Chest 2 View; Future  Sore throat- Her RSS is negative.  Her symptoms and exam are consistent with a viral etiology. -     POCT rapid strep A  Viral URI with cough -     Discontinue: HYDROcodone-homatropine (HYCODAN) 5-1.5 MG/5ML syrup; Take 5 mLs by mouth every 8 (eight) hours as needed for cough. -     HYDROcodone-homatropine (HYCODAN) 5-1.5 MG/5ML syrup; Take 5 mLs by mouth every 8 (eight) hours as needed for cough.   I am having Stacey Holland maintain her Norgestimate-Ethinyl Estradiol Triphasic, multivitamin, citalopram, potassium chloride SA, lisdexamfetamine, SUMAtriptan, promethazine, linaclotide, omeprazole, fluticasone, and HYDROcodone-homatropine.  Meds  ordered this encounter  Medications  . DISCONTD: HYDROcodone-homatropine (HYCODAN) 5-1.5 MG/5ML syrup    Sig: Take 5 mLs by mouth every 8 (eight) hours as needed for cough.    Dispense:  120 mL    Refill:  0  . HYDROcodone-homatropine (HYCODAN) 5-1.5 MG/5ML syrup    Sig: Take 5 mLs by mouth every 8 (eight) hours as needed for cough.    Dispense:  120 mL    Refill:  0     Follow-up: Return if symptoms worsen or fail to improve.  Sanda Linger, MD

## 2018-03-15 ENCOUNTER — Encounter: Payer: Self-pay | Admitting: Internal Medicine

## 2018-03-22 ENCOUNTER — Ambulatory Visit: Payer: BLUE CROSS/BLUE SHIELD | Admitting: Gastroenterology

## 2018-03-30 ENCOUNTER — Ambulatory Visit: Payer: BLUE CROSS/BLUE SHIELD | Admitting: Family

## 2018-03-30 ENCOUNTER — Encounter: Payer: Self-pay | Admitting: Family

## 2018-03-30 VITALS — BP 102/70 | HR 111 | Temp 99.5°F | Ht 64.0 in | Wt 135.1 lb

## 2018-03-30 DIAGNOSIS — F502 Bulimia nervosa: Secondary | ICD-10-CM

## 2018-03-30 DIAGNOSIS — A084 Viral intestinal infection, unspecified: Secondary | ICD-10-CM

## 2018-03-30 MED ORDER — PROMETHAZINE HCL 25 MG PO TABS: 25.0000 mg | ORAL_TABLET | Freq: Three times a day (TID) | ORAL | 0 refills | Status: DC | PRN

## 2018-03-30 MED ORDER — SUMATRIPTAN SUCCINATE 50 MG PO TABS: 50.0000 mg | ORAL_TABLET | ORAL | 6 refills | Status: DC | PRN

## 2018-03-30 NOTE — Progress Notes (Signed)
Stacey Holland is a 22 y.o. female with the following history as recorded in Epic[employer]:  Patient Active Problem List   Diagnosis Date Noted  . Sore throat 03/14/2018  . Viral URI with cough 03/14/2018  . Anorexia nervosa   . Eating disorder 12/01/2017  . Attention and concentration deficit 12/01/2017  . Chronic idiopathic constipation 12/01/2017  . Vomiting associated with bulimia nervosa with nausea 06/22/2017  . Hypomagnesemia 09/25/2016  . Hypokalemia 09/18/2016  . Cough 09/17/2016  . Routine general medical examination at a health care facility 08/26/2016  . Atypical lymphocytosis 08/25/2016  . PCOS (polycystic ovarian syndrome) 04/03/2016  . Essential hypertension 02/20/2015  . GE reflux     Current Outpatient Medications  Medication Sig Dispense Refill  . citalopram (CELEXA) 20 MG tablet Take 1 tablet (20 mg total) by mouth daily. 90 tablet 1  . fluticasone (FLONASE) 50 MCG/ACT nasal spray Place 2 sprays into both nostrils daily. 16 g 6  . linaclotide (LINZESS) 72 MCG capsule Take 1 capsule (72 mcg total) by mouth daily before breakfast. 90 capsule 0  . lisdexamfetamine (VYVANSE) 20 MG capsule Take 1 capsule (20 mg total) by mouth daily. 30 capsule 0  . Multiple Vitamin (MULTIVITAMIN) tablet Take 1 tablet by mouth daily.    . Norgestimate-Ethinyl Estradiol Triphasic (TRI-PREVIFEM) 0.18/0.215/0.25 MG-35 MCG tablet Take 1 tablet by mouth at bedtime.    [name] omeprazole (PRILOSEC) 20 MG capsule Take 1 capsule (20 mg total) by mouth daily. 90 capsule 1  . potassium chloride SA (K-DUR,KLOR-CON) 20 MEQ tablet Take 1 tablet (20 mEq total) by mouth 3 (three) times daily. 90 tablet 3  . promethazine (PHENERGAN) 25 MG tablet Take 1 tablet (25 mg total) by mouth every 8 (eight) hours as needed for nausea or vomiting. 35 tablet 0  . SUMAtriptan (IMITREX) 50 MG tablet Take 1 tablet (50 mg total) by mouth every 2 (two) hours as needed for migraine. May repeat in 2 hours if headache persists or  recurs. 12 tablet 6   No current facility-administered medications for this visit.     Allergies: Guanfacine; Pineapple; and Other  Past Medical History:  Diagnosis Date  . Anorexia nervosa   . Dyspnea    breathing heavyeven when sitting, lying dpwn,exertion  . GERD (gastroesophageal reflux disease)   . Headache   . History of blood transfusion    prematurity, this was during perinatal period  . Hypertension   . Morbid obesity (HCC) 04/03/2016  . PCOS (polycystic ovarian syndrome) 04/03/2016  . Reflux   . Syncope     Past Surgical History:  Procedure Laterality Date  . CHOLECYSTECTOMY N/A 11/25/2016   Procedure: LAPAROSCOPIC CHOLECYSTECTOMY;  Surgeon: Axel Filler, MD;  Location: [facility] OR;  Service: General;  Laterality: N/A;  . ESOPHAGOGASTRODUODENOSCOPY N/A 09/26/2016   Procedure: ESOPHAGOGASTRODUODENOSCOPY (EGD);  Surgeon: Beverley Fiedler, MD;  Location: [facility];  Service: Endoscopy;  Laterality: N/A;  . eustachion tubes      Family History  Problem Relation Age of Onset  . GER disease Mother   . Hypertension Mother   . GER disease Father   . Hypertension Father   . Lactose intolerance Brother   . Hypertension Maternal Grandfather   . Diabetes Maternal Grandfather   . Stroke Maternal Grandfather   . Diabetes Paternal Uncle   . Cancer Neg Hx   . Depression Neg Hx   . Drug abuse Neg Hx   . Early death Neg Hx   . Heart disease Neg  Hx   . Hyperlipidemia Neg Hx   . Kidney disease Neg Hx   . Alcohol abuse Neg Hx   . Asthma Neg Hx     Social History   Tobacco Use  . Smoking status: Never Smoker  . Smokeless tobacco: Never Used  Substance Use Topics  . Alcohol use: No    Subjective: Patient presents with concerns for sudden onset of nausea, vomiting, body aches; started suddenly yesterday and slowly started to improve today; went out to eat on Monday evening prior to onset of symptoms; no other family members with similar symptoms; no burning on urination; became  concerned when she saw dark stool today but did take Pepto Bismol yesterday; took Phenergan yesterday with no relief; describes stomach as 'feeling crampy and tender."   Objective:  Vitals:   03/30/18 1603  BP: 102/70  Pulse: (!) 111  Temp: 99.5 F (37.5 C)  TempSrc: Oral  SpO2: 99%  Weight: 135 lb 1.9 oz (61.3 kg)  Height:  (1.626 m)    General: Well developed, well nourished, in no acute distress  Skin : Warm and dry.  Head: Normocephalic and atraumatic  Lungs: Respirations unlabored; clear to auscultation bilaterally without wheeze, rales, rhonchi  CVS exam: normal rate and regular rhythm.  Abdomen: Soft; nontender; nondistended; normoactive bowel sounds; no masses or hepatosplenomegaly  Neurologic: Alert and oriented; speech intact; face symmetrical; moves all extremities well; CNII-XII intact without focal deficit   Assessment:  1. Viral gastroenteritis   2. Vomiting associated with bulimia nervosa with nausea     Plan:  Patient is improved today; BRAT diet encouraged, fluids (continue Pedialyte); refill on Phenergan; if symptoms still present by Friday am, please call back and will consider imaging and labs;     No follow-ups on file.  No orders of the defined types were placed in this encounter.   Requested Prescriptions   Signed Prescriptions Disp Refills  . SUMAtriptan (IMITREX) 50 MG tablet 12 tablet 6    Sig: Take 1 tablet (50 mg total) by mouth every 2 (two) hours as needed for migraine. May repeat in 2 hours if headache persists or recurs.  . promethazine (PHENERGAN) 25 MG tablet 35 tablet 0    Sig: Take 1 tablet (25 mg total) by mouth every 8 (eight) hours as needed for nausea or vomiting.

## 2018-04-09 ENCOUNTER — Emergency Department (HOSPITAL_COMMUNITY)
Admission: EM | Admit: 2018-04-09 | Discharge: 2018-04-10 | Disposition: A | Discharge status: Home / Self Care | Payer: BLUE CROSS/BLUE SHIELD | Attending: Emergency Medicine | Admitting: Emergency Medicine

## 2018-04-09 ENCOUNTER — Encounter: Payer: Self-pay | Admitting: Family Medicine

## 2018-04-09 ENCOUNTER — Emergency Department (HOSPITAL_COMMUNITY): Payer: BLUE CROSS/BLUE SHIELD

## 2018-04-09 ENCOUNTER — Other Ambulatory Visit: Payer: Self-pay

## 2018-04-09 ENCOUNTER — Ambulatory Visit: Payer: BLUE CROSS/BLUE SHIELD | Admitting: Family Medicine

## 2018-04-09 ENCOUNTER — Encounter (HOSPITAL_COMMUNITY): Payer: Self-pay

## 2018-04-09 VITALS — BP 100/64 | HR 91 | Temp 98.4°F | Ht 64.0 in | Wt 136.2 lb

## 2018-04-09 DIAGNOSIS — R109 Unspecified abdominal pain: Secondary | ICD-10-CM | POA: Diagnosis present

## 2018-04-09 DIAGNOSIS — E282 Polycystic ovarian syndrome: Secondary | ICD-10-CM | POA: Diagnosis not present

## 2018-04-09 DIAGNOSIS — N739 Female pelvic inflammatory disease, unspecified: Secondary | ICD-10-CM | POA: Diagnosis not present

## 2018-04-09 DIAGNOSIS — R102 Pelvic and perineal pain: Secondary | ICD-10-CM

## 2018-04-09 DIAGNOSIS — M545 Low back pain, unspecified: Secondary | ICD-10-CM

## 2018-04-09 DIAGNOSIS — Z79899 Other long term (current) drug therapy: Secondary | ICD-10-CM | POA: Diagnosis not present

## 2018-04-09 DIAGNOSIS — I10 Essential (primary) hypertension: Secondary | ICD-10-CM | POA: Diagnosis not present

## 2018-04-09 LAB — URINALYSIS, ROUTINE W REFLEX MICROSCOPIC
Bacteria, UA: NONE SEEN
GLUCOSE, UA: NEGATIVE mg/dL
Hgb urine dipstick: NEGATIVE
KETONES UR: NEGATIVE mg/dL
LEUKOCYTES UA: NEGATIVE
Nitrite: NEGATIVE
PH: 9 — AB (ref 5.0–8.0)
Protein, ur: 100 mg/dL — AB
SPECIFIC GRAVITY, URINE: 1.03 (ref 1.005–1.030)

## 2018-04-09 LAB — COMPREHENSIVE METABOLIC PANEL
ALK PHOS: 44 U/L (ref 38–126)
ALT: 16 U/L (ref 14–54)
AST: 22 U/L (ref 15–41)
Albumin: 3.7 g/dL (ref 3.5–5.0)
Anion gap: 8 (ref 5–15)
BUN: 11 mg/dL (ref 6–20)
CALCIUM: 9.2 mg/dL (ref 8.9–10.3)
CO2: 28 mmol/L (ref 22–32)
CREATININE: 0.87 mg/dL (ref 0.44–1.00)
Chloride: 103 mmol/L (ref 101–111)
Glucose, Bld: 83 mg/dL (ref 65–99)
Potassium: 3.4 mmol/L — ABNORMAL LOW (ref 3.5–5.1)
Sodium: 139 mmol/L (ref 135–145)
Total Bilirubin: 0.4 mg/dL (ref 0.3–1.2)
Total Protein: 7.5 g/dL (ref 6.5–8.1)

## 2018-04-09 LAB — WET PREP, GENITAL
CLUE CELLS WET PREP: NONE SEEN
SPERM: NONE SEEN
Trich, Wet Prep: NONE SEEN
YEAST WET PREP: NONE SEEN

## 2018-04-09 LAB — CBC
HCT: 37.8 % (ref 36.0–46.0)
Hemoglobin: 12.4 g/dL (ref 12.0–15.0)
MCH: 27.7 pg (ref 26.0–34.0)
MCHC: 32.8 g/dL (ref 30.0–36.0)
MCV: 84.4 fL (ref 78.0–100.0)
PLATELETS: 295 10*3/uL (ref 150–400)
RBC: 4.48 MIL/uL (ref 3.87–5.11)
RDW: 12.2 % (ref 11.5–15.5)
WBC: 5 10*3/uL (ref 4.0–10.5)

## 2018-04-09 LAB — POCT URINALYSIS DIPSTICK
Bilirubin, UA: NEGATIVE
Blood, UA: NEGATIVE
Glucose, UA: NEGATIVE
KETONES UA: NEGATIVE
Leukocytes, UA: NEGATIVE
NITRITE UA: NEGATIVE
PH UA: 8.5 — AB (ref 5.0–8.0)
PROTEIN UA: POSITIVE — AB
Spec Grav, UA: <=1.005 — AB (ref 1.010–1.025)
UROBILINOGEN UA: 0.2 U/dL

## 2018-04-09 LAB — I-STAT BETA HCG BLOOD, ED (MC, WL, AP ONLY): I-stat hCG, quantitative: <5 m[IU]/mL (ref <5)

## 2018-04-09 LAB — LIPASE, BLOOD: LIPASE: 28 U/L (ref 11–51)

## 2018-04-09 MED ORDER — OXYCODONE-ACETAMINOPHEN 5-325 MG PO TABS
1.0000 | ORAL_TABLET | ORAL | Status: DC | PRN
  Administered 2018-04-09: 1 via ORAL
  Filled 2018-04-09: qty 1

## 2018-04-09 MED ORDER — IOPAMIDOL (ISOVUE-300) INJECTION 61%
INTRAVENOUS | Status: AC
  Filled 2018-04-09: qty 100

## 2018-04-09 MED ORDER — MORPHINE SULFATE (PF) 2 MG/ML IV SOLN
2.0000 mg | Freq: Once | INTRAVENOUS | Status: AC
  Administered 2018-04-09: 2 mg via INTRAVENOUS
  Filled 2018-04-09: qty 1

## 2018-04-09 MED ORDER — LORAZEPAM 0.5 MG PO TABS
0.5000 mg | ORAL_TABLET | Freq: Once | ORAL | Status: AC
  Administered 2018-04-09: 0.5 mg via ORAL
  Filled 2018-04-09: qty 1

## 2018-04-09 MED ORDER — SODIUM CHLORIDE 0.9 % IV BOLUS
1000.0000 mL | Freq: Once | INTRAVENOUS | Status: AC
  Administered 2018-04-09: 1000 mL via INTRAVENOUS

## 2018-04-09 MED ORDER — MORPHINE SULFATE (PF) 4 MG/ML IV SOLN
4.0000 mg | Freq: Once | INTRAVENOUS | Status: AC
  Administered 2018-04-09: 4 mg via INTRAVENOUS
  Filled 2018-04-09: qty 1

## 2018-04-09 MED ORDER — KETOROLAC TROMETHAMINE 30 MG/ML IJ SOLN
30.0000 mg | Freq: Once | INTRAMUSCULAR | Status: AC
  Administered 2018-04-10: 30 mg via INTRAVENOUS
  Filled 2018-04-09: qty 1

## 2018-04-09 MED ORDER — CEFTRIAXONE SODIUM 250 MG IJ SOLR
250.0000 mg | Freq: Once | INTRAMUSCULAR | Status: AC
  Administered 2018-04-09: 250 mg via INTRAMUSCULAR
  Filled 2018-04-09: qty 250

## 2018-04-09 MED ORDER — IOPAMIDOL (ISOVUE-300) INJECTION 61%
100.0000 mL | Freq: Once | INTRAVENOUS | Status: AC | PRN
  Administered 2018-04-09: 100 mL via INTRAVENOUS

## 2018-04-09 MED ORDER — AZITHROMYCIN 1 G PO PACK
1.0000 g | PACK | Freq: Once | ORAL | Status: AC
  Administered 2018-04-10: 1 g via ORAL
  Filled 2018-04-09: qty 1

## 2018-04-09 MED ORDER — LIDOCAINE HCL 1 % IJ SOLN
INTRAMUSCULAR | Status: AC
  Administered 2018-04-09: 0.9 mL
  Filled 2018-04-09: qty 20

## 2018-04-09 NOTE — ED Provider Notes (Signed)
I saw and evaluated the patient, reviewed the resident's note and I agree with the findings and plan.  EKG: None 22 year old female presents with bilateral lower abdominal pain worse on the left side times several days.  Seen in her physician's office and sent here for further evaluation of PID versus appendicitis.  On exam here she is tender her left lower quadrant without peritoneal signs.  More concern for ovarian pathology.  Will start with pelvic ultrasound and CT if indicated   Lorre NickAllen, Mohd Clemons, MD 04/09/18 1806

## 2018-04-09 NOTE — Discharge Instructions (Addendum)
It was a pleasure to take care of you Stacey Holland. During your hospital stay you were diagnosed with pelvic inflammatory disease. Please get repeat 1g azithromycin shot in one week on 04/16/18.

## 2018-04-09 NOTE — Progress Notes (Signed)
   Subjective:    Patient ID: Stacey Holland, female    DOB: 09-30-1996, 22 y.o.   MRN: [id number]  HPI Here for 10 days of worsening pelvic pains with nausea and vomiting. No diarrhea or fever. No urinary symptoms. No vaginal DC or odor. She was seen on 03-23-18 and this was felt to be a viral enteritis. She was given Phenergan and this has helped the nausea, but the pains have been getting worse. He has had intercourse with her boyfriend a few times during these 10 day, and this was painful for her. LMP was May 13.    Review of Systems  Constitutional: Negative.   Respiratory: Negative.   Cardiovascular: Negative.   Gastrointestinal: Positive for abdominal pain, nausea and vomiting. Negative for abdominal distention, anal bleeding, blood in stool, constipation, diarrhea and rectal pain.  Genitourinary: Positive for pelvic pain. Negative for difficulty urinating, dysuria, frequency, vaginal bleeding and vaginal discharge.       Objective:   Physical Exam  Constitutional: She appears well-developed and well-nourished. No distress.  Cardiovascular: Normal rate, regular rhythm, normal heart sounds and intact distal pulses.  Pulmonary/Chest: Effort normal and breath sounds normal.  Abdominal: Soft. Bowel sounds are normal. She exhibits no distension and no mass. There is no rebound and no guarding. No hernia.  She is quite tender over the entire lower abdomen. Heel percussions are positive.           Assessment & Plan:  Pelvic pain, likely due to PID. Appendicitis is also possible. She will need further evaluation so we will send her directly to [facility] from here. She agrees with this plan.  Gershon CraneStephen Fry, MD

## 2018-04-09 NOTE — ED Provider Notes (Signed)
[facility]-EMERGENCY DEPT Provider Note   CSN: [id number] Arrival date & time: 04/09/18  1217     History   Chief Complaint Chief Complaint  Patient presents with  . Abdominal Pain    HPI Stacey Holland is a 22 y.o. female. With hypertension, pcos, gerd, anorexia nervosa who presented with abdominal pain. The patient states that the pain started on Thursday in the lower abdomen which started getting worse and now is present all over abdomen, but worse in the left lower quadrant and pelvic region. She currently describes the pain as 8/10 intensity, achy/squeezing in nature, worsened with movement. She has accompanied nausea and has vomited once today.   The patient's lmp was on 03/21/18 and she usually has her periods regularly every month. She is sexually active with her boyfriend and her last sexual encounter was 2 days ago. She mentions that she was having pelvic pain during intercourse. She does not have vaginal discharge or bleeding.     The patient went to [facility] today where she was evaluated and thought to have appendicitis and therefore sent to [facility].    Past Medical History:  Diagnosis Date  . Anorexia nervosa   . Dyspnea    breathing heavyeven when sitting, lying dpwn,exertion  . GERD (gastroesophageal reflux disease)   . Headache   . History of blood transfusion    prematurity, this was during perinatal period  . Hypertension   . Morbid obesity (HCC) 04/03/2016  . PCOS (polycystic ovarian syndrome) 04/03/2016  . Reflux   . Syncope     Patient Active Problem List   Diagnosis Date Noted  . Sore throat 03/14/2018  . Viral URI with cough 03/14/2018  . Anorexia nervosa   . Eating disorder 12/01/2017  . Attention and concentration deficit 12/01/2017  . Chronic idiopathic constipation 12/01/2017  . Vomiting associated with bulimia nervosa with nausea 06/22/2017  . Hypomagnesemia 09/25/2016  . Hypokalemia 09/18/2016  . Cough 09/17/2016  .  Routine general medical examination at a health care facility 08/26/2016  . Atypical lymphocytosis 08/25/2016  . PCOS (polycystic ovarian syndrome) 04/03/2016  . Essential hypertension 02/20/2015  . GE reflux     Past Surgical History:  Procedure Laterality Date  . CHOLECYSTECTOMY N/A 11/25/2016   Procedure: LAPAROSCOPIC CHOLECYSTECTOMY;  Surgeon: Axel Filler, MD;  Location: [facility] OR;  Service: General;  Laterality: N/A;  . ESOPHAGOGASTRODUODENOSCOPY N/A 09/26/2016   Procedure: ESOPHAGOGASTRODUODENOSCOPY (EGD);  Surgeon: Beverley Fiedler, MD;  Location: [facility];  Service: Endoscopy;  Laterality: N/A;  . eustachion tubes       OB History   None      Home Medications    Prior to Admission medications   Medication Sig Start Date End Date Taking? Authorizing Provider  acetaminophen (TYLENOL) 500 MG tablet Take 1,500 mg by mouth daily.   Yes [provider]  Biotin 40981 MCG TABS Take 10,000 mcg by mouth daily.   Yes [provider]  citalopram (CELEXA) 20 MG tablet Take 1 tablet (20 mg total) by mouth daily. 12/01/17  Yes Etta Grandchild, MD  diphenhydrAMINE HCl, Sleep, (ZZZQUIL PO) Take 1 Dose by mouth at bedtime.   Yes [provider]  fluticasone (FLONASE) 50 MCG/ACT nasal spray Place 2 sprays into both nostrils daily. Patient taking differently: Place 2 sprays into both nostrils as needed for allergies or rhinitis.  03/09/18  Yes Olive Bass, FNP  linaclotide [facility]) 72 MCG capsule Take 1 capsule (72 mcg total)  by mouth daily before breakfast. Patient taking differently: Take 72 mcg by mouth as needed (constipation).  03/09/18  Yes Olive Bass, FNP  lisdexamfetamine (VYVANSE) 20 MG capsule Take 1 capsule (20 mg total) by mouth daily. 01/20/18  Yes Etta Grandchild, MD  Multiple Vitamin (MULTIVITAMIN) tablet Take 1 tablet by mouth daily.   Yes [provider]  Norgestimate-Ethinyl Estradiol Triphasic (TRI-PREVIFEM)  0.18/0.215/0.25 MG-35 MCG tablet Take 1 tablet by mouth at bedtime.   Yes [provider]  omeprazole (PRILOSEC) 20 MG capsule Take 1 capsule (20 mg total) by mouth daily. 03/09/18 03/09/19 Yes Olive Bass, FNP  potassium chloride SA (K-DUR,KLOR-CON) 20 MEQ tablet Take 1 tablet (20 mEq total) by mouth 3 (three) times daily. 01/17/18  Yes Etta Grandchild, MD  promethazine (PHENERGAN) 25 MG tablet Take 1 tablet (25 mg total) by mouth every 8 (eight) hours as needed for nausea or vomiting. 03/30/18  Yes Olive Bass, FNP  SUMAtriptan (IMITREX) 50 MG tablet Take 1 tablet (50 mg total) by mouth every 2 (two) hours as needed for migraine. May repeat in 2 hours if headache persists or recurs. 03/30/18  Yes Olive Bass, FNP    Family History Family History  Problem Relation Age of Onset  . GER disease Mother   . Hypertension Mother   . GER disease Father   . Hypertension Father   . Lactose intolerance Brother   . Hypertension Maternal Grandfather   . Diabetes Maternal Grandfather   . Stroke Maternal Grandfather   . Diabetes Paternal Uncle   . Cancer Neg Hx   . Depression Neg Hx   . Drug abuse Neg Hx   . Early death Neg Hx   . Heart disease Neg Hx   . Hyperlipidemia Neg Hx   . Kidney disease Neg Hx   . Alcohol abuse Neg Hx   . Asthma Neg Hx     Social History Social History   Tobacco Use  . Smoking status: Never Smoker  . Smokeless tobacco: Never Used  Substance Use Topics  . Alcohol use: No  . Drug use: No     Allergies   Guanfacine; Pineapple; and Other   Review of Systems  Denies dysuria, burning while urinating She has decreased appetite   Physical Exam Updated Vital Signs BP 121/72   Pulse 95   Temp 98.4 F (36.9 C) (Oral)   Resp 18   LMP 03/21/2018   SpO2 95%   Physical Exam  Constitutional: She appears well-developed and well-nourished. She appears distressed.  HENT:  Head: Normocephalic and atraumatic.    Cardiovascular: Normal rate, regular rhythm, normal heart sounds and intact distal pulses. Friction rub:    Pulmonary/Chest: Breath sounds normal. No stridor. No respiratory distress.  Abdominal: Soft. Normal appearance and bowel sounds are normal. She exhibits no distension, no fluid wave and no ascites. There is generalized tenderness (more prominent in llq). There is no CVA tenderness.  Negative rovsing, psoas, and mcburneys signs  Genitourinary:  Genitourinary Comments: No external erythema or presence of bartholin glands. Cervical motion tenderness present.  Neurological: She is alert.  Psychiatric: She has a normal mood and affect. Her behavior is normal.    ED Treatments / Results  Labs (all labs ordered are listed, but only abnormal results are displayed) Labs Reviewed  WET PREP, GENITAL - Abnormal; Notable for the following components:      Result Value   WBC, Wet Prep HPF POC MODERATE (*)  All other components within normal limits  COMPREHENSIVE METABOLIC PANEL - Abnormal; Notable for the following components:   Potassium 3.4 (*)    All other components within normal limits  URINALYSIS, ROUTINE W REFLEX MICROSCOPIC - Abnormal; Notable for the following components:   Color, Urine AMBER (*)    APPearance HAZY (*)    pH 9.0 (*)    Bilirubin Urine SMALL (*)    Protein, ur 100 (*)    All other components within normal limits  LIPASE, BLOOD  CBC  HIV ANTIBODY (ROUTINE TESTING)  RPR  I-STAT BETA HCG BLOOD, ED ([facility], [facility], [facility] ONLY)  GC/CHLAMYDIA PROBE AMP ([place]) NOT AT [facility]    EKG None  Radiology [place] Transvaginal Non-ob  Result Date: 04/09/2018 CLINICAL DATA:  22 year old female with acute LEFT pelvic pain. EXAM: TRANSABDOMINAL AND TRANSVAGINAL ULTRASOUND OF PELVIS DOPPLER ULTRASOUND OF OVARIES TECHNIQUE: Both transabdominal and transvaginal ultrasound examinations of the pelvis were performed. Transabdominal technique was performed for global imaging of the pelvis  including uterus, ovaries, adnexal regions, and pelvic cul-de-sac. It was necessary to proceed with endovaginal exam following the transabdominal exam to visualize the ovaries and endometrium. Color and duplex Doppler ultrasound was utilized to evaluate blood flow to the ovaries. COMPARISON:  02/16/2018 CT.  02/17/2011 ultrasound FINDINGS: Uterus Measurements: 6.6 x 3.2 x 3.8 cm. No fibroids or other mass visualized. Endometrium Thickness: 4 mm.  No focal abnormality visualized. Right ovary Measurements: 3.5 x 2.5 x 3.1 cm. A probable corpus luteum cyst noted. Normal appearance/no adnexal mass. Left ovary Measurements: 2.6 x 1.7 x 1.6 cm. Normal appearance/no adnexal mass. Pulsed Doppler evaluation of both ovaries demonstrates normal low-resistance arterial and venous waveforms. Other findings No abnormal free fluid. IMPRESSION: No acute or significant abnormalities. No evidence of ovarian torsion, free fluid or adnexal mass. Electronically Signed   By: Harmon PierJeffrey  Hu M.D.   On: 04/09/2018 21:31   [place] Pelvis Complete  Result Date: 04/09/2018 CLINICAL DATA:  22 year old female with acute LEFT pelvic pain. EXAM: TRANSABDOMINAL AND TRANSVAGINAL ULTRASOUND OF PELVIS DOPPLER ULTRASOUND OF OVARIES TECHNIQUE: Both transabdominal and transvaginal ultrasound examinations of the pelvis were performed. Transabdominal technique was performed for global imaging of the pelvis including uterus, ovaries, adnexal regions, and pelvic cul-de-sac. It was necessary to proceed with endovaginal exam following the transabdominal exam to visualize the ovaries and endometrium. Color and duplex Doppler ultrasound was utilized to evaluate blood flow to the ovaries. COMPARISON:  02/16/2018 CT.  02/17/2011 ultrasound FINDINGS: Uterus Measurements: 6.6 x 3.2 x 3.8 cm. No fibroids or other mass visualized. Endometrium Thickness: 4 mm.  No focal abnormality visualized. Right ovary Measurements: 3.5 x 2.5 x 3.1 cm. A probable corpus luteum cyst  noted. Normal appearance/no adnexal mass. Left ovary Measurements: 2.6 x 1.7 x 1.6 cm. Normal appearance/no adnexal mass. Pulsed Doppler evaluation of both ovaries demonstrates normal low-resistance arterial and venous waveforms. Other findings No abnormal free fluid. IMPRESSION: No acute or significant abnormalities. No evidence of ovarian torsion, free fluid or adnexal mass. Electronically Signed   By: Harmon PierJeffrey  Hu M.D.   On: 04/09/2018 21:31   Ct Abdomen Pelvis W Contrast  Result Date: 04/09/2018 CLINICAL DATA:  22 year old female with acute abdominal and pelvic pain, nausea and vomiting for 2 days. EXAM: CT ABDOMEN AND PELVIS WITH CONTRAST TECHNIQUE: Multidetector CT imaging of the abdomen and pelvis was performed using the standard protocol following bolus administration of intravenous contrast. CONTRAST:  100 cc intravenous Isovue-300 COMPARISON:  02/16/2018 CT and 04/09/2018 pelvic  ultrasound FINDINGS: Lower chest: No acute abnormality Hepatobiliary: The liver is unremarkable. The patient is status post cholecystectomy. No biliary dilatation. Pancreas: Unremarkable Spleen: Unremarkable Adrenals/Urinary Tract: The kidneys, adrenal glands and bladder are unremarkable. Stomach/Bowel: Stomach is within normal limits. Appendix appears normal. No evidence of bowel wall thickening, distention, or inflammatory changes. Vascular/Lymphatic: No significant vascular findings are present. No enlarged abdominal or pelvic lymph nodes. Reproductive: Uterus and bilateral adnexa are unremarkable. Other: No ascites, pneumoperitoneum or abscess. Musculoskeletal: No acute bony abnormality. Mild T11 SUPERIOR endplate compression again noted. Grade 1 anterolisthesis of L5 on S1 and degenerative disc disease at this level again noted. IMPRESSION: 1. No acute abnormality.  Normal appendix. 2. Unchanged grade 1 anterolisthesis of L5 on S1 with degenerative disc disease at this level. Electronically Signed   By: Harmon Pier M.D.    On: 04/09/2018 22:56   [place] Art/ven Flow Abd Pelv Doppler  Result Date: 04/09/2018 CLINICAL DATA:  22 year old female with acute LEFT pelvic pain. EXAM: TRANSABDOMINAL AND TRANSVAGINAL ULTRASOUND OF PELVIS DOPPLER ULTRASOUND OF OVARIES TECHNIQUE: Both transabdominal and transvaginal ultrasound examinations of the pelvis were performed. Transabdominal technique was performed for global imaging of the pelvis including uterus, ovaries, adnexal regions, and pelvic cul-de-sac. It was necessary to proceed with endovaginal exam following the transabdominal exam to visualize the ovaries and endometrium. Color and duplex Doppler ultrasound was utilized to evaluate blood flow to the ovaries. COMPARISON:  02/16/2018 CT.  02/17/2011 ultrasound FINDINGS: Uterus Measurements: 6.6 x 3.2 x 3.8 cm. No fibroids or other mass visualized. Endometrium Thickness: 4 mm.  No focal abnormality visualized. Right ovary Measurements: 3.5 x 2.5 x 3.1 cm. A probable corpus luteum cyst noted. Normal appearance/no adnexal mass. Left ovary Measurements: 2.6 x 1.7 x 1.6 cm. Normal appearance/no adnexal mass. Pulsed Doppler evaluation of both ovaries demonstrates normal low-resistance arterial and venous waveforms. Other findings No abnormal free fluid. IMPRESSION: No acute or significant abnormalities. No evidence of ovarian torsion, free fluid or adnexal mass. Electronically Signed   By: Harmon Pier M.D.   On: 04/09/2018 21:31    Procedures Procedures (including critical care time)  Medications Ordered in ED Medications  oxyCODONE-acetaminophen (PERCOCET/ROXICET) 5-325 MG per tablet 1 tablet (1 tablet Oral Given 04/09/18 1241)  iopamidol (ISOVUE-300) 61 % injection (has no administration in time range)  ketorolac (TORADOL) 30 MG/ML injection 30 mg (has no administration in time range)  azithromycin (ZITHROMAX) powder 1 g (has no administration in time range)  morphine 4 MG/ML injection 4 mg (4 mg Intravenous Given 04/09/18 1837)  sodium  chloride 0.9 % bolus 1,000 mL (0 mLs Intravenous Stopped 04/09/18 2053)  morphine 4 MG/ML injection 4 mg (4 mg Intravenous Given 04/09/18 2051)  cefTRIAXone (ROCEPHIN) injection 250 mg (250 mg Intramuscular Given 04/09/18 2216)  LORazepam (ATIVAN) tablet 0.5 mg (0.5 mg Oral Given 04/09/18 2215)  morphine 2 MG/ML injection 2 mg (2 mg Intravenous Given 04/09/18 2214)  lidocaine (XYLOCAINE) 1 % (with pres) injection (0.9 mLs  Given 04/09/18 2217)  iopamidol (ISOVUE-300) 61 % injection 100 mL (100 mLs Intravenous Contrast Given 04/09/18 2232)     Initial Impression / Assessment and Plan / ED Course  I have reviewed the triage vital signs and the nursing notes.  Pertinent labs & imaging results that were available during my care of the patient were reviewed by me and considered in my medical decision making (see chart for details).  Patient has generalized abdominal pain with more prominent pain present over left lower quadrant.  The patient is negative for Psoas, Mcburney, or Rovsing sign to indicate peritoneal involvement. Therefore, less likely that patient has appendicitis.   Urinalysis was negative for nitrites and leukocytes to indicate presence of a urinary tract infection. Patient did not have any cva tenderness. She is HCG negative. Patient's lipase=28 without epigastric pain and therefore less likely to have pancreatitis.   The patient's more prominent left lower quadrant pain causes concern for ovarian abscess and possibly torsion. Evaluated with Pelvic ultrasound with flow doppler which did not show any acute abnormalties and no presence of ovarian torsion, free fluid, or adnexal mass. CT abdomen was without any evidence of appendicitis.  Patient has cervical motion tenderness, moderate amount of white vaginal discharge, no blooding. Specimens sent for GC/chlamydia, wet prep, and hiv and rpr were ordered. Wet prep showed moderate amount of wbc. GC/chlamydia pending.   The patient has Pelvic  inflammatory disease. Patient given percocet and morphine for pain control. 1L normal saline bolus. 1g of azithromycin and IM 250mg  of ceftriaxone.   -The patient should follow up and get repeat wet prep in 1 week to ensure clearance of bacteria   Final Clinical Impressions(s) / ED Diagnoses   Final diagnoses:  None    ED Discharge Orders    None       Lorenso Courier, MD 04/09/18 2359    Lorre Nick, MD 04/10/18 2255

## 2018-04-09 NOTE — ED Notes (Signed)
Bed: WA21 Expected date:  Expected time:  Means of arrival:  Comments: 

## 2018-04-09 NOTE — ED Triage Notes (Signed)
Pt with abdominal pain with n/v since Thursday.  Pt went to  and questioning infection or appendix.  Pain is on "left" radiating to right.  Worse on left.  No fever.  Some bloating sensation.

## 2018-04-10 LAB — RPR: RPR Ser Ql: NONREACTIVE

## 2018-04-10 LAB — HIV ANTIBODY (ROUTINE TESTING W REFLEX): HIV Screen 4th Generation wRfx: NONREACTIVE

## 2018-04-11 ENCOUNTER — Other Ambulatory Visit: Payer: Self-pay | Admitting: Internal Medicine

## 2018-04-11 DIAGNOSIS — K3184 Gastroparesis: Secondary | ICD-10-CM | POA: Insufficient documentation

## 2018-04-11 DIAGNOSIS — E876 Hypokalemia: Secondary | ICD-10-CM

## 2018-04-12 LAB — GC/CHLAMYDIA PROBE AMP (~~LOC~~) NOT AT ARMC
Chlamydia: NEGATIVE
Neisseria Gonorrhea: NEGATIVE

## 2018-04-18 ENCOUNTER — Encounter: Payer: Self-pay | Admitting: Family

## 2018-04-18 ENCOUNTER — Other Ambulatory Visit: Payer: Self-pay | Admitting: Family

## 2018-04-18 ENCOUNTER — Ambulatory Visit (INDEPENDENT_AMBULATORY_CARE_PROVIDER_SITE_OTHER)
Admission: RE | Admit: 2018-04-18 | Discharge: 2018-04-18 | Disposition: A | Discharge status: Home / Self Care | Payer: BLUE CROSS/BLUE SHIELD | Source: Ambulatory Visit | Attending: Family | Admitting: Family

## 2018-04-18 ENCOUNTER — Ambulatory Visit: Payer: BLUE CROSS/BLUE SHIELD | Admitting: Family

## 2018-04-18 VITALS — BP 102/78 | HR 84 | Temp 98.0°F | Ht 64.0 in | Wt 148.1 lb

## 2018-04-18 DIAGNOSIS — F502 Bulimia nervosa: Secondary | ICD-10-CM | POA: Diagnosis not present

## 2018-04-18 DIAGNOSIS — G8929 Other chronic pain: Secondary | ICD-10-CM

## 2018-04-18 DIAGNOSIS — M545 Low back pain, unspecified: Secondary | ICD-10-CM

## 2018-04-18 NOTE — Progress Notes (Signed)
Stacey Holland is a 22 y.o. female with the following history as recorded in Epic[employer]:  Patient Active Problem List   Diagnosis Date Noted  . Sore throat 03/14/2018  . Viral URI with cough 03/14/2018  . Anorexia nervosa   . Eating disorder 12/01/2017  . Attention and concentration deficit 12/01/2017  . Chronic idiopathic constipation 12/01/2017  . Vomiting associated with bulimia nervosa with nausea 06/22/2017  . Hypomagnesemia 09/25/2016  . Hypokalemia 09/18/2016  . Cough 09/17/2016  . Routine general medical examination at a health care facility 08/26/2016  . Atypical lymphocytosis 08/25/2016  . PCOS (polycystic ovarian syndrome) 04/03/2016  . Essential hypertension 02/20/2015  . GE reflux     Current Outpatient Medications  Medication Sig Dispense Refill  . acetaminophen (TYLENOL) 500 MG tablet Take 1,500 mg by mouth daily.    . Biotin [id number] MCG TABS Take 10,000 mcg by mouth daily.    . citalopram (CELEXA) 20 MG tablet Take 1 tablet (20 mg total) by mouth daily. 90 tablet 1  . dicyclomine (BENTYL) 20 MG tablet dicyclomine 20 mg tablet    . diphenhydrAMINE HCl, Sleep, (ZZZQUIL PO) Take 1 Dose by mouth at bedtime.    . fluticasone (FLONASE) 50 MCG/ACT nasal spray Place 2 sprays into both nostrils daily. (Patient taking differently: Place 2 sprays into both nostrils as needed for allergies or rhinitis. ) 16 g 6  . hyoscyamine (LEVSIN, ANASPAZ) 0.125 MG tablet hyoscyamine sulfate 0.125 mg tablet  TAKE 1 TABLET (0.125 MG TOTAL) BY MOUTH EVERY 6 (SIX) HOURS AS NEEDED FOR CRAMPING.    [name]. lisdexamfetamine (VYVANSE) 20 MG capsule Take 1 capsule (20 mg total) by mouth daily. 30 capsule 0  . magnesium oxide (MAG-OX) 400 MG tablet Take 1 tablet by mouth daily.  11  . Multiple Vitamin (MULTIVITAMIN) tablet Take 1 tablet by mouth daily.    . Norgestimate-Ethinyl Estradiol Triphasic (TRI-PREVIFEM) 0.18/0.215/0.25 MG-35 MCG tablet Take 1 tablet by mouth at bedtime.    [name]. omeprazole (PRILOSEC) 20  MG capsule Take 1 capsule (20 mg total) by mouth daily. 90 capsule 1  . polyethylene glycol (MIRALAX / GLYCOLAX) packet Take by mouth.    . promethazine (PHENERGAN) 25 MG tablet Take 1 tablet (25 mg total) by mouth every 8 (eight) hours as needed for nausea or vomiting. 35 tablet 0  . SUMAtriptan (IMITREX) 50 MG tablet Take 1 tablet (50 mg total) by mouth every 2 (two) hours as needed for migraine. May repeat in 2 hours if headache persists or recurs. 12 tablet 6  . traZODone (DESYREL) 50 MG tablet TAKE 1 TABLET (50 MG TOTAL) BY MOUTH NIGHTLY AS NEEDED FOR SLEEP.  3   No current facility-administered medications for this visit.     Allergies: Guanfacine; Pineapple; and Other  Past Medical History:  Diagnosis Date  . Anorexia nervosa   . Dyspnea    breathing heavyeven when sitting, lying dpwn,exertion  . GERD (gastroesophageal reflux disease)   . Headache   . History of blood transfusion    prematurity, this was during perinatal period  . Hypertension   . Morbid obesity (HCC) 04/03/2016  . PCOS (polycystic ovarian syndrome) 04/03/2016  . Reflux   . Syncope     Past Surgical History:  Procedure Laterality Date  . CHOLECYSTECTOMY N/A 11/25/2016   Procedure: LAPAROSCOPIC CHOLECYSTECTOMY;  Surgeon: Axel FillerArmando Ramirez, MD;  [facility];  Service: General;  Laterality: N/A;  . ESOPHAGOGASTRODUODENOSCOPY N/A 09/26/2016   Procedure: ESOPHAGOGASTRODUODENOSCOPY (EGD);  Surgeon: Carie CaddyJay M  Pyrtle, MD;  Location: [facility];  Service: Endoscopy;  Laterality: N/A;  . eustachion tubes      Family History  Problem Relation Age of Onset  . GER disease Mother   . Hypertension Mother   . GER disease Father   . Hypertension Father   . Lactose intolerance Brother   . Hypertension Maternal Grandfather   . Diabetes Maternal Grandfather   . Stroke Maternal Grandfather   . Diabetes Paternal Uncle   . Cancer Neg Hx   . Depression Neg Hx   . Drug abuse Neg Hx   . Early death Neg Hx   . Heart disease  Neg Hx   . Hyperlipidemia Neg Hx   . Kidney disease Neg Hx   . Alcohol abuse Neg Hx   . Asthma Neg Hx     Social History   Tobacco Use  . Smoking status: Never Smoker  . Smokeless tobacco: Never Used  Substance Use Topics  . Alcohol use: No    Subjective:  Patient presents today with numerous concerns- majority related to her underlying eating disorder; records are available for review at time of OV regarding recent inpatient stay at [facility] eating disorder clinic; was there from 6/3-6/7 and opted to leave AMA because she felt like she was doing so well; per notes, it was strongly recommended that patient consider residential treatment at [facility] and get established with psychiatrist, therapist and nutritionist; patient states she is living with her sister and sister is fixing her meals; 1) Patient is asking for appetite suppressant to help with her binge urges; already on Vyvanse per psychiatrist managing her ADHD; 2) Does not feel Imitrex working as well; could not tolerate Nortriptyline given by her neurologist; 3) Feels that her memory is not as good; "forgetting things" 4) Chronic daily back pain- no known injury or trauma; 5) Right thumb pain x 1 week; no known injury or trauma;   Objective:  Vitals:   04/18/18 0941  BP: 102/78  Pulse: 84  Temp: 98 F (36.7 C)  TempSrc: Oral  SpO2: 95%  Weight: 148 lb 1.9 oz (67.2 kg)  Height: 5\' 4"  (1.626 m)    General: Well developed, well nourished, in no acute distress  Skin : Warm and dry.  Head: Normocephalic and atraumatic  Eyes: Sclera and conjunctiva clear; pupils round and reactive to light; extraocular movements intact  Lungs: Respirations unlabored; clear to auscultation bilaterally without wheeze, rales, rhonchi  Musculoskeletal: No deformities; no active joint inflammation  Extremities: No edema, cyanosis, clubbing  Vessels: Symmetric bilaterally  Neurologic: Alert and oriented; speech intact; face symmetrical; moves  all extremities well; CNII-XII intact without focal deficit  Assessment:  1. Bulimia nervosa   2. Low back pain without sciatica, unspecified back pain laterality, unspecified chronicity     Plan:  Very concerned about patient leaving the eating disorder clinic before treatment completed; explained to her that it would not be appropriate to prescribe her an appetite suppressant/ explained that Vyvanse is indicated to help manage binge eating disorder/ stressed necessity of seeing psychiatry and therapist and nutritionist as recommended upon her discharge; she notes she already has a local psychiatrist but agrees to updated referrals for therapy/ nutrition; Asked patient to let her neurologist know that she could not tolerate the Nortriptyline; may be a candidate for one of the new injectable medications; Will update lumbar X-ray today- may need to consider PT;  Repeatedly stressed during the course of the visit that it was  very important to get her the proper care/ management for her eating disorder; she expressed understanding and agreed to see her psychiatrist as soon as possible;  Spent 30 minutes with patient; greater than 50% spent in counseling;   No follow-ups on file.  Orders Placed This Encounter  Procedures  . DG Lumbar Spine 2-3 Views    Standing Status:   Future    Number of Occurrences:   1    Standing Expiration Date:   06/19/2019    Order Specific Question:   Reason for Exam (SYMPTOM  OR DIAGNOSIS REQUIRED)    Answer:   low back pain    Order Specific Question:   Is patient pregnant?    Answer:   No    Order Specific Question:   Preferred imaging location?    Answer:   [facility]    Order Specific Question:   Radiology Contrast Protocol - do NOT remove file path    Answer:   [email or web address]  . Ambulatory referral to Psychiatry    Referral Priority:   Routine    Referral Type:   Psychiatric    Referral Reason:   Specialty  Services Required    Requested Specialty:   Psychiatry    Number of Visits Requested:   1  . Referral to Nutrition and Diabetes Services    Referral Priority:   Routine    Referral Type:   Consultation    Referral Reason:   Specialty Services Required    Requested Specialty:   Nutrition    Number of Visits Requested:   1    Requested Prescriptions    No prescriptions requested or ordered in this encounter

## 2018-04-18 NOTE — Patient Instructions (Signed)
Levert FeinsteinYijun Yan, M.D. Ph.D.  [facility] [address], [address] [place], [place] [address] Ph: [phone] Fax: [phone]

## 2018-04-19 ENCOUNTER — Encounter: Payer: Self-pay | Admitting: Family

## 2018-04-19 ENCOUNTER — Ambulatory Visit: Payer: BLUE CROSS/BLUE SHIELD | Admitting: Family

## 2018-04-19 ENCOUNTER — Encounter: Payer: Self-pay | Admitting: Neurology

## 2018-04-19 ENCOUNTER — Other Ambulatory Visit: Payer: Self-pay | Admitting: Family

## 2018-04-19 NOTE — Telephone Encounter (Signed)
Please advise. Thanks.  

## 2018-04-20 ENCOUNTER — Telehealth: Payer: Self-pay | Admitting: *Deleted

## 2018-04-20 MED ORDER — PROPRANOLOL HCL 20 MG PO TABS: 20.0000 mg | ORAL_TABLET | Freq: Two times a day (BID) | ORAL | 11 refills | Status: DC

## 2018-04-20 MED ORDER — RIZATRIPTAN BENZOATE 5 MG PO TABS: ORAL_TABLET | ORAL | 11 refills | Status: DC

## 2018-04-20 NOTE — Telephone Encounter (Signed)
Dr. Terrace ArabiaYan has reviewed patients concerns and her chart.  Per vo by Dr. Terrace ArabiaYan, it is okay that she discontinued use of nortriptyline (added to allergy list in Epic) and she can stop use of sumatriptan since it is unhelpful.  She has offered the following medication changes:  1) propranolol 20mg , one tablet BID  2) rizatriptan 5mg , take 1 tab at onset of migraine.  May repeat in 2 hrs, if needed.  Max dose: 2 tabs/day.  #12 per 30 days  Spoke to patient - she is agreeable to these changes.  New prescriptions have been sent to the pharmacy.  She has scheduled a follow up in one month with Dr. Terrace ArabiaYan.  She has also noticed increased forgetfulness and would like to discuss this at her next office visit, if it is still problematic.

## 2018-04-20 NOTE — Addendum Note (Signed)
Addended by: Lindell SparKIRKMAN, MICHELLE C on: 04/20/2018 10:59 AM   Modules accepted: Orders

## 2018-04-20 NOTE — Telephone Encounter (Signed)
-----   Message from Mychart, Generic sent at 04/19/2018 6:43 PM EDT -----    Hello,    So I took the nortriptaline for a week when you first prescribed it but it's been causing me to have hallucinations so I stopped using it, I don't need a prescription for it anymore. The Sumatriptans not working for my migraines anymore and I wanted to know if there's anything else I can do. I've also been very forgetful lately and I don't know if it might be a side effect to one of the medications I take.    Thanks,   [name]

## 2018-04-21 ENCOUNTER — Emergency Department (HOSPITAL_COMMUNITY): Payer: BLUE CROSS/BLUE SHIELD

## 2018-04-21 ENCOUNTER — Encounter (HOSPITAL_COMMUNITY): Payer: Self-pay

## 2018-04-21 ENCOUNTER — Emergency Department (HOSPITAL_COMMUNITY)
Admission: EM | Admit: 2018-04-21 | Discharge: 2018-04-21 | Disposition: A | Discharge status: Home / Self Care | Payer: BLUE CROSS/BLUE SHIELD | Attending: Emergency Medicine | Admitting: Emergency Medicine

## 2018-04-21 ENCOUNTER — Other Ambulatory Visit: Payer: Self-pay

## 2018-04-21 DIAGNOSIS — Z79899 Other long term (current) drug therapy: Secondary | ICD-10-CM | POA: Insufficient documentation

## 2018-04-21 DIAGNOSIS — R1032 Left lower quadrant pain: Secondary | ICD-10-CM | POA: Diagnosis present

## 2018-04-21 DIAGNOSIS — R1030 Lower abdominal pain, unspecified: Secondary | ICD-10-CM

## 2018-04-21 DIAGNOSIS — I10 Essential (primary) hypertension: Secondary | ICD-10-CM | POA: Diagnosis not present

## 2018-04-21 LAB — COMPREHENSIVE METABOLIC PANEL
ALBUMIN: 3.3 g/dL — AB (ref 3.5–5.0)
ALT: 18 U/L (ref 14–54)
AST: 24 U/L (ref 15–41)
Alkaline Phosphatase: 37 U/L — ABNORMAL LOW (ref 38–126)
Anion gap: 7 (ref 5–15)
BUN: 9 mg/dL (ref 6–20)
CHLORIDE: 105 mmol/L (ref 101–111)
CO2: 28 mmol/L (ref 22–32)
CREATININE: 0.79 mg/dL (ref 0.44–1.00)
Calcium: 8.9 mg/dL (ref 8.9–10.3)
GFR calc Af Amer: >60 mL/min (ref >60)
GLUCOSE: 87 mg/dL (ref 65–99)
POTASSIUM: 3.7 mmol/L (ref 3.5–5.1)
SODIUM: 140 mmol/L (ref 135–145)
Total Bilirubin: 0.4 mg/dL (ref 0.3–1.2)
Total Protein: 6.5 g/dL (ref 6.5–8.1)

## 2018-04-21 LAB — URINALYSIS, ROUTINE W REFLEX MICROSCOPIC
Bilirubin Urine: NEGATIVE
GLUCOSE, UA: NEGATIVE mg/dL
KETONES UR: NEGATIVE mg/dL
Leukocytes, UA: NEGATIVE
Nitrite: NEGATIVE
PH: 7 (ref 5.0–8.0)
Protein, ur: NEGATIVE mg/dL
RBC / HPF: >50 RBC/hpf — ABNORMAL HIGH (ref 0–5)
Specific Gravity, Urine: 1.006 (ref 1.005–1.030)

## 2018-04-21 LAB — LIPASE, BLOOD: LIPASE: 28 U/L (ref 11–51)

## 2018-04-21 LAB — CBC
HEMATOCRIT: 36.2 % (ref 36.0–46.0)
Hemoglobin: 11.6 g/dL — ABNORMAL LOW (ref 12.0–15.0)
MCH: 27.3 pg (ref 26.0–34.0)
MCHC: 32 g/dL (ref 30.0–36.0)
MCV: 85.2 fL (ref 78.0–100.0)
PLATELETS: 264 10*3/uL (ref 150–400)
RBC: 4.25 MIL/uL (ref 3.87–5.11)
RDW: 12.6 % (ref 11.5–15.5)
WBC: 4.2 10*3/uL (ref 4.0–10.5)

## 2018-04-21 LAB — I-STAT BETA HCG BLOOD, ED (MC, WL, AP ONLY): I-stat hCG, quantitative: <5 m[IU]/mL (ref <5)

## 2018-04-21 MED ORDER — IOPAMIDOL (ISOVUE-M 300) INJECTION 61%: 15.0000 mL | Freq: Once | INTRAMUSCULAR | Status: DC | PRN

## 2018-04-21 MED ORDER — DICYCLOMINE HCL 20 MG PO TABS: 10.0000 mg | ORAL_TABLET | Freq: Three times a day (TID) | ORAL | 0 refills | Status: DC

## 2018-04-21 MED ORDER — ONDANSETRON 4 MG PO TBDP
4.0000 mg | ORAL_TABLET | Freq: Once | ORAL | Status: AC | PRN
  Administered 2018-04-21: 4 mg via ORAL
  Filled 2018-04-21: qty 1

## 2018-04-21 MED ORDER — IOPAMIDOL (ISOVUE-300) INJECTION 61%
INTRAVENOUS | Status: AC
  Filled 2018-04-21: qty 100

## 2018-04-21 MED ORDER — DICYCLOMINE HCL 10 MG PO CAPS
10.0000 mg | ORAL_CAPSULE | Freq: Once | ORAL | Status: AC
  Administered 2018-04-21: 10 mg via ORAL
  Filled 2018-04-21: qty 1

## 2018-04-21 MED ORDER — GI COCKTAIL ~~LOC~~
30.0000 mL | Freq: Once | ORAL | Status: AC
  Administered 2018-04-21: 30 mL via ORAL
  Filled 2018-04-21: qty 30

## 2018-04-21 MED ORDER — MORPHINE SULFATE (PF) 2 MG/ML IV SOLN
1.0000 mg | Freq: Once | INTRAVENOUS | Status: AC
  Administered 2018-04-21: 1 mg via INTRAVENOUS
  Filled 2018-04-21: qty 1

## 2018-04-21 MED ORDER — DICYCLOMINE HCL 10 MG PO CAPS: 10.0000 mg | ORAL_CAPSULE | Freq: Three times a day (TID) | ORAL | Status: DC

## 2018-04-21 MED ORDER — IOPAMIDOL (ISOVUE-300) INJECTION 61%
100.0000 mL | Freq: Once | INTRAVENOUS | Status: AC | PRN
  Administered 2018-04-21: 100 mL via INTRAVENOUS

## 2018-04-21 NOTE — ED Notes (Signed)
Patient request lab draw with IV start. 

## 2018-04-21 NOTE — ED Notes (Signed)
Patient verbalized understanding of discharge instructions, no questions. IV removed, bleeding controlled. Patient ambulated out of ED with steady gait in no distress.  

## 2018-04-21 NOTE — ED Notes (Signed)
Bed: WA01 Expected date:  Expected time:  Means of arrival:  Comments: Hold for triage 

## 2018-04-21 NOTE — ED Notes (Signed)
Patient called out again for pain and nausea. Left note for PA. PA and MD not in office.

## 2018-04-21 NOTE — ED Notes (Signed)
Patient called out for pain and nausea. PA made aware.

## 2018-04-21 NOTE — ED Provider Notes (Signed)
[facility]  CSN: [id number] Arrival date & time: 04/21/18  1202  History   Chief Complaint Chief Complaint  Patient presents with  . Abdominal Pain  . Constipation   HPI Stacey Holland is a 22 y.o. female with a medical history of bulimia, GERD, cholecystectomy, HTN, PCOS, anxiety and OCD who presented to the ED for lower abdominal pain that began this morning. Patient describes the pain as cramping and radiates from LLQ to RLQ. Associated symptoms: nausea and constipation. Denies vomiting, change in urinary habits, hematochezia/melena. She states she is currently on her period and has not had any issues with it. Patient has tried Linzess, Tylenol and ibuprofen prior to coming to the ED.  Additional history obtained by medical chart. Patient was at her gyn yesterday 04/20/18 for pelvic pain. She states she was treated for STDs on 04/09/18. Patient was at [facility] for inpatient treatment for her eating disorder. Per medical chart, patient left AMA.  Past Medical History:  Diagnosis Date  . Anorexia nervosa   . Dyspnea    breathing heavyeven when sitting, lying dpwn,exertion  . GERD (gastroesophageal reflux disease)   . Headache   . History of blood transfusion    prematurity, this was during perinatal period  . Hypertension   . Morbid obesity (HCC) 04/03/2016  . PCOS (polycystic ovarian syndrome) 04/03/2016  . Reflux   . Syncope     Patient Active Problem List   Diagnosis Date Noted  . Sore throat 03/14/2018  . Anorexia nervosa   . Eating disorder 12/01/2017  . Attention and concentration deficit 12/01/2017  . Chronic idiopathic constipation 12/01/2017  . Vomiting associated with bulimia nervosa with nausea 06/22/2017  . Hypomagnesemia 09/25/2016  . Hypokalemia 09/18/2016  . Cough 09/17/2016  . Routine general medical examination at a health care facility 08/26/2016  . Atypical lymphocytosis 08/25/2016  . PCOS (polycystic  ovarian syndrome) 04/03/2016  . Essential hypertension 02/20/2015  . GE reflux     Past Surgical History:  Procedure Laterality Date  . CHOLECYSTECTOMY N/A 11/25/2016   Procedure: LAPAROSCOPIC CHOLECYSTECTOMY;  Surgeon: Axel Filler, MD;  Location: [facility] OR;  Service: General;  Laterality: N/A;  . ESOPHAGOGASTRODUODENOSCOPY N/A 09/26/2016   Procedure: ESOPHAGOGASTRODUODENOSCOPY (EGD);  Surgeon: Beverley Fiedler, MD;  Location: [facility];  Service: Endoscopy;  Laterality: N/A;  . eustachion tubes       OB History   None      Home Medications    Prior to Admission medications   Medication Sig Start Date End Date Taking? Authorizing Provider  acetaminophen (TYLENOL) 500 MG tablet Take 1,000 mg by mouth every 8 (eight) hours as needed for moderate pain.    Yes [provider]  Biotin 40981 MCG TABS Take 10,000 mcg by mouth daily.   Yes [provider]  fluticasone (FLONASE) 50 MCG/ACT nasal spray Place 2 sprays into both nostrils daily. Patient taking differently: Place 2 sprays into both nostrils as needed for allergies or rhinitis.  03/09/18  Yes Olive Bass, FNP  ibuprofen (ADVIL,MOTRIN) 200 MG tablet Take 200 mg by mouth every 6 (six) hours as needed for moderate pain.   Yes [provider]  linaCLOtide (LINZESS PO) Take 1 tablet by mouth daily as needed (constipation).   Yes [provider]  lisdexamfetamine (VYVANSE) 20 MG capsule Take 1 capsule (20 mg total) by mouth daily. 01/20/18  Yes Etta Grandchild, MD  magnesium oxide (MAG-OX) 400 MG tablet Take 1  tablet by mouth daily. 04/15/18  Yes [provider]  metoCLOPramide (REGLAN) 5 MG tablet Take 5 mg by mouth 3 (three) times daily before meals.   Yes [provider]  Multiple Vitamin (MULTIVITAMIN) tablet Take 1 tablet by mouth daily.   Yes [provider]  naproxen (NAPROSYN) 500 MG tablet Take 1,000 mg by mouth daily.  04/20/18  Yes [provider]    Norgestimate-Ethinyl Estradiol Triphasic (TRI-PREVIFEM) 0.18/0.215/0.25 MG-35 MCG tablet Take 1 tablet by mouth at bedtime.   Yes [provider]  omeprazole (PRILOSEC) 20 MG capsule Take 1 capsule (20 mg total) by mouth daily. 03/09/18 03/09/19 Yes Olive BassMurray, Laura Woodruff, FNP  OVER THE COUNTER MEDICATION Take 500 mg by mouth daily. CBD Oil   Yes [provider]  polyethylene glycol (MIRALAX / GLYCOLAX) packet Take 17 g by mouth daily as needed for moderate constipation.  04/15/18 05/15/18 Yes [provider]  promethazine (PHENERGAN) 25 MG tablet Take 1 tablet (25 mg total) by mouth every 8 (eight) hours as needed for nausea or vomiting. 03/30/18  Yes Olive BassMurray, Laura Woodruff, FNP  propranolol (INDERAL) 20 MG tablet Take 1 tablet (20 mg total) by mouth 2 (two) times daily. 04/20/18  Yes Levert FeinsteinYan, Yijun, MD  rizatriptan (MAXALT) 5 MG tablet Take 1 tab at onset of migraine.  May repeat in 2 hrs, if needed.  Max dose: 2 tabs/day. This is a 30 day prescription. Patient taking differently: Take 5 mg by mouth daily as needed for migraine. Take 1 tab at onset of migraine.  May repeat in 2 hrs, if needed.  Max dose: 2 tabs/day. This is a 30 day prescription. 04/20/18  Yes Levert FeinsteinYan, Yijun, MD  traZODone (DESYREL) 50 MG tablet TAKE 1 TABLET (50 MG TOTAL) BY MOUTH QHS 04/15/18  Yes [provider]  citalopram (CELEXA) 20 MG tablet Take 1 tablet (20 mg total) by mouth daily. Patient not taking: Reported on 04/21/2018 12/01/17   Etta GrandchildJones, Thomas L, MD  clotrimazole-betamethasone (LOTRISONE) cream Apply 1 application topically 2 (two) times daily.  04/20/18   [provider]  dicyclomine (BENTYL) 20 MG tablet Take 0.5 tablets (10 mg total) by mouth 4 (four) times daily -  before meals and at bedtime for 14 days. 04/21/18 05/05/18  Sherena Machorro, Jerrel IvoryGabrielle I, PA-C  hyoscyamine (LEVSIN, ANASPAZ) 0.125 MG tablet hyoscyamine sulfate 0.125 mg tablet  TAKE 1 TABLET (0.125 MG TOTAL) BY MOUTH EVERY 6 (SIX) HOURS AS  NEEDED FOR CRAMPING.    [provider]    Family History Family History  Problem Relation Age of Onset  . GER disease Mother   . Hypertension Mother   . GER disease Father   . Hypertension Father   . Lactose intolerance Brother   . Hypertension Maternal Grandfather   . Diabetes Maternal Grandfather   . Stroke Maternal Grandfather   . Diabetes Paternal Uncle   . Cancer Neg Hx   . Depression Neg Hx   . Drug abuse Neg Hx   . Early death Neg Hx   . Heart disease Neg Hx   . Hyperlipidemia Neg Hx   . Kidney disease Neg Hx   . Alcohol abuse Neg Hx   . Asthma Neg Hx     Social History Social History   Tobacco Use  . Smoking status: Never Smoker  . Smokeless tobacco: Never Used  Substance Use Topics  . Alcohol use: No  . Drug use: No     Allergies   Guanfacine; Nortriptyline;  Pineapple; and Other   Review of Systems Review of Systems  Constitutional: Negative for activity change, appetite change, chills, diaphoresis, fatigue and fever.  Eyes: Negative for visual disturbance.  Respiratory: Negative for cough, chest tightness, shortness of breath and wheezing.   Cardiovascular: Negative for chest pain, palpitations and leg swelling.  Gastrointestinal: Positive for abdominal pain, constipation and nausea. Negative for blood in stool, diarrhea, rectal pain and vomiting.  Endocrine: Negative.   Genitourinary: Negative for difficulty urinating, dysuria, hematuria and menstrual problem.  Skin: Negative.   Neurological: Negative.  Negative for dizziness, syncope, light-headedness, numbness and headaches.  Hematological: Negative.      Physical Exam Updated Vital Signs BP 118/81   Pulse 75   Temp 98.8 F (37.1 C) (Oral)   Resp 18   Ht 5\' 4"  (1.626 m)   Wt 68 kg (150 lb)   SpO2 100%   BMI 25.75 kg/m   Physical Exam  Constitutional: Vital signs are normal. She appears well-developed and well-nourished. She does not have a sickly appearance. She does not  appear ill.  Pt initially seen lying comfortably on bed eating snacks.   HENT:  Mouth/Throat: Uvula is midline, oropharynx is clear and moist and mucous membranes are normal.  Eyes: Pupils are equal, round, and reactive to light. Conjunctivae, EOM and lids are normal.  Cardiovascular: Regular rhythm, normal heart sounds, intact distal pulses and normal pulses.  No murmur heard. Pulmonary/Chest: Effort normal and breath sounds normal.  Abdominal: Soft. Normal appearance and bowel sounds are normal. There is generalized tenderness. There is guarding.  Neurological: She is alert.  Skin: Skin is warm and intact. No rash noted. She is not diaphoretic.  Psychiatric: Her mood appears anxious.  Nursing note and vitals reviewed.    ED Treatments / Results  Labs (all labs ordered are listed, but only abnormal results are displayed) Labs Reviewed  COMPREHENSIVE METABOLIC PANEL - Abnormal; Notable for the following components:      Result Value   Albumin 3.3 (*)    Alkaline Phosphatase 37 (*)    All other components within normal limits  CBC - Abnormal; Notable for the following components:   Hemoglobin 11.6 (*)    All other components within normal limits  URINALYSIS, ROUTINE W REFLEX MICROSCOPIC - Abnormal; Notable for the following components:   Hgb urine dipstick LARGE (*)    RBC / HPF >50 (*)    Bacteria, UA RARE (*)    All other components within normal limits  LIPASE, BLOOD  I-STAT BETA HCG BLOOD, ED ([facility], [facility], [facility] ONLY)    EKG None  Radiology Ct Abdomen Pelvis W Contrast  Result Date: 04/21/2018 CLINICAL DATA:  Diffuse abdominal pain for the past 2 days. EXAM: CT ABDOMEN AND PELVIS WITH CONTRAST TECHNIQUE: Multidetector CT imaging of the abdomen and pelvis was performed using the standard protocol following bolus administration of intravenous contrast. CONTRAST:  ISOVUE-300 IOPAMIDOL (ISOVUE-300) INJECTION 61% COMPARISON:  CT abdomen pelvis dated April 09, 2018. FINDINGS:  Lower chest: Minimal bibasilar atelectasis. Hepatobiliary: Unchanged focal fat along the falciform ligament. No other focal liver abnormality. Unchanged mild periportal edema. Status post cholecystectomy. No biliary dilatation. Pancreas: Unremarkable. No pancreatic ductal dilatation or surrounding inflammatory changes. Spleen: Normal in size without focal abnormality. Adrenals/Urinary Tract: Adrenal glands are unremarkable. Kidneys are normal, without renal calculi, focal lesion, or hydronephrosis. Bladder is unremarkable. Stomach/Bowel: Stomach is within normal limits. Appendix appears normal. No evidence of bowel wall thickening, distention, or inflammatory changes. Vascular/Lymphatic:  No significant vascular findings are present. No enlarged abdominal or pelvic lymph nodes. Reproductive: Uterus and bilateral adnexa are unremarkable. Other: Trace free fluid in the pelvis, likely physiologic. No pneumoperitoneum. Musculoskeletal: No acute or significant osseous findings. Unchanged 7 mm anterolisthesis at L5-S1. Unchanged chronic mild superior endplate compression deformity of T11. IMPRESSION: 1.  No acute intra-abdominal process. Electronically Signed   By: Obie Dredge M.D.   On: 04/21/2018 17:24    Procedures Procedures (including critical care time)  Medications Ordered in ED Medications  iopamidol (ISOVUE-300) 61 % injection (has no administration in time range)  iopamidol (ISOVUE-M) 61 % intrathecal injection 15 mL (has no administration in time range)  ondansetron (ZOFRAN-ODT) disintegrating tablet 4 mg (4 mg Oral Given 04/21/18 1240)  dicyclomine (BENTYL) capsule 10 mg (10 mg Oral Given 04/21/18 1519)  morphine 2 MG/ML injection 1 mg (1 mg Intravenous Given 04/21/18 1601)  iopamidol (ISOVUE-300) 61 % injection 100 mL (100 mLs Intravenous Contrast Given 04/21/18 1701)  gi cocktail (Maalox,Lidocaine,Donnatal) (30 mLs Oral Given 04/21/18 1752)     Initial Impression / Assessment and Plan / ED  Course  Triage vital signs and the nursing notes have been reviewed.  Pertinent labs & imaging results that were available during care of the patient were reviewed and considered in medical decision making (see chart for details). Clinical Course as of Apr 21 1808  Thu Apr 21, 2018  1529 Bentyl 10mg  and Zofran 4mg  x1 given for abdominal cramping and vomiting. Labs are unremarkable.   [GM]  1801 Abdominal CT normal. No GI or GU abnormalities that could be contributing to pt's pain.   [GM]    Clinical Course User Index [GM] Dezeray Puccio, Sharyon Medicus, PA-C   Patient was initially observed in the room lying comfortably eating bagged snacks. After this provider entered the room, patient began to grimace as if in pain. On abdominal exam, patient is exquisitely tender to light palpation throughout, but moreso in lower quadrants. Patient's pain is out of proportion to her physical exam findings. CT abdomen is reassuring that there is not an acute abdominal or gynecologic condition that requires further treatment. Given that pt is currently menstruation, pain could be 2/2 to menstrual cramps, but there appears to be a psychosomaticcomponent to pt's complaints as well.   Final Clinical Impressions(s) / ED Diagnoses  1. Lower Abdominal Pain. Likely menstrual cramps given history, labs and physical exam findings. Education provided on OTC and supportive treatments for abdominal pain. Relief achieved in the ED with Bentyl will prescribe. Advised patient to follow-up if with her GI provider, [facility], if symptoms do not resolve in > 2 weeks.  Dispo: Home. After thorough clinical evaluation, this patient is determined to be medically stable and can be safely discharged with the previously mentioned treatment and/or outpatient follow-up/referral(s). At this time, there are no other apparent medical conditions that require further screening, evaluation or treatment.  Final diagnoses:  Lower abdominal pain    ED  Discharge Orders        Ordered    dicyclomine (BENTYL) 20 MG tablet  3 times daily before meals & bedtime     04/21/18 1745        Reva Bores 04/21/18 1809    Donnetta Hutching, MD 04/22/18 [phone]

## 2018-04-21 NOTE — ED Triage Notes (Signed)
Patient c/o mid abdominal pain since this AM. Patient also c/o constipation. Patient states he is recovering from an eating disorder. Patient states she did have a AM today and was normal.

## 2018-04-21 NOTE — Discharge Instructions (Addendum)
You may take the Bentyl as prescribed for abdominal cramping. You may also use Tylenol, Ibuprofen, Naproxen, etc. Heat compress may help as well.  Follow-up with your PCP or GI doc to discuss further if abdominal pain persists longer than 2 weeks.

## 2018-04-27 DIAGNOSIS — F418 Other specified anxiety disorders: Secondary | ICD-10-CM | POA: Insufficient documentation

## 2018-04-28 ENCOUNTER — Telehealth: Payer: Self-pay

## 2018-04-28 DIAGNOSIS — R41840 Attention and concentration deficit: Secondary | ICD-10-CM

## 2018-04-28 DIAGNOSIS — F502 Bulimia nervosa: Secondary | ICD-10-CM

## 2018-04-28 NOTE — Telephone Encounter (Signed)
Called pt and confirmed the referrals that she needed. The referrals have been entered with specifics patient requested.d

## 2018-04-28 NOTE — Telephone Encounter (Signed)
Copied from CRM #[id number]. Topic: Referral - Request >> Apr 27, 2018 10:32 AM Lathan, Latoya M, NT wrote: Reason for CRM: pt. Calling to request referrals for a nutritionist and psychiatrist   

## 2018-05-11 ENCOUNTER — Telehealth: Payer: Self-pay

## 2018-05-11 NOTE — Telephone Encounter (Signed)
Left vm with details to contact [facility] at [phone]

## 2018-05-11 NOTE — Telephone Encounter (Signed)
Copied from CRM [id number]. Topic: Quick Communication - See Telephone Encounter >> May 09, 2018  9:46 AM Herby AbrahamJohnson, Shiquita C wrote: CRM for notification. See Telephone encounter for: 05/09/18.   Pt called in to speak with a nurse. Pt says that she has questions about digestion and what is good foods to eat? What are some otc medications that she could take that would help her with digestion?   Please advise.   CB: [phone] >> May 10, 2018  1:48 PM Cairrikier Kendrick Ranchavidson, Lorisa Scheid, CMA wrote: Do you know if pt has been scheduled with a nutritionist.  >> May 10, 2018  2:02 PM Reuel BoomBowers, Lori D wrote: [facility]. Pt may call them at [phone] >> May 10, 2018  3:41 PM Cairrikier Kendrick Ranchavidson, Arlena Marsan, CMA wrote: Can you tell me about the psychiatrist referral.  >> May 10, 2018  4:27 PM Reuel BoomBowers, Lori D wrote: [facility] - if she hasn't heard from them yet, their # is [phone]

## 2018-05-16 ENCOUNTER — Other Ambulatory Visit (INDEPENDENT_AMBULATORY_CARE_PROVIDER_SITE_OTHER): Payer: BLUE CROSS/BLUE SHIELD

## 2018-05-16 ENCOUNTER — Ambulatory Visit: Payer: BLUE CROSS/BLUE SHIELD | Admitting: Family

## 2018-05-16 ENCOUNTER — Encounter: Payer: Self-pay | Admitting: Family

## 2018-05-16 VITALS — BP 102/62 | HR 96 | Temp 98.3°F | Ht 64.0 in | Wt 151.0 lb

## 2018-05-16 DIAGNOSIS — F509 Eating disorder, unspecified: Secondary | ICD-10-CM

## 2018-05-16 DIAGNOSIS — Z111 Encounter for screening for respiratory tuberculosis: Secondary | ICD-10-CM | POA: Diagnosis not present

## 2018-05-16 DIAGNOSIS — Z23 Encounter for immunization: Secondary | ICD-10-CM

## 2018-05-16 DIAGNOSIS — R109 Unspecified abdominal pain: Secondary | ICD-10-CM

## 2018-05-16 LAB — COMPREHENSIVE METABOLIC PANEL
ALT: 10 U/L (ref 0–35)
AST: 14 U/L (ref 0–37)
Albumin: 3.7 g/dL (ref 3.5–5.2)
Alkaline Phosphatase: 42 U/L (ref 39–117)
BUN: 19 mg/dL (ref 6–23)
CALCIUM: 9.2 mg/dL (ref 8.4–10.5)
CHLORIDE: 102 meq/L (ref 96–112)
CO2: 28 meq/L (ref 19–32)
Creatinine, Ser: 0.8 mg/dL (ref 0.40–1.20)
GFR: 94.95 mL/min (ref >60.00)
Glucose, Bld: 77 mg/dL (ref 70–99)
Potassium: 4.1 mEq/L (ref 3.5–5.1)
Sodium: 137 mEq/L (ref 135–145)
Total Bilirubin: 0.3 mg/dL (ref 0.2–1.2)
Total Protein: 7 g/dL (ref 6.0–8.3)

## 2018-05-16 LAB — LIPASE: Lipase: 233 U/L — ABNORMAL HIGH (ref 11.0–59.0)

## 2018-05-16 LAB — PHOSPHORUS: Phosphorus: 3.7 mg/dL (ref 2.3–4.6)

## 2018-05-16 LAB — CBC WITH DIFFERENTIAL/PLATELET
BASOS PCT: 0.8 % (ref 0.0–3.0)
Basophils Absolute: 0 10*3/uL (ref 0.0–0.1)
Eosinophils Absolute: 0.1 10*3/uL (ref 0.0–0.7)
Eosinophils Relative: 2.5 % (ref 0.0–5.0)
HEMATOCRIT: 36.1 % (ref 36.0–46.0)
Hemoglobin: 12.1 g/dL (ref 12.0–15.0)
Lymphs Abs: 2 10*3/uL (ref 0.7–4.0)
MCHC: 33.6 g/dL (ref 30.0–36.0)
MCV: 82.8 fl (ref 78.0–100.0)
MONOS PCT: 6.6 % (ref 3.0–12.0)
Monocytes Absolute: 0.2 10*3/uL (ref 0.1–1.0)
NEUTROS ABS: 1 10*3/uL — AB (ref 1.4–7.7)
NEUTROS PCT: 29.4 % — AB (ref 43.0–77.0)
PLATELETS: 245 10*3/uL (ref 150.0–400.0)
RBC: 4.37 Mil/uL (ref 3.87–5.11)
RDW: 12.1 % (ref 11.5–15.5)
WBC: 3.4 10*3/uL — ABNORMAL LOW (ref 4.0–10.5)

## 2018-05-16 LAB — AMYLASE: AMYLASE: 194 U/L — AB (ref 27–131)

## 2018-05-16 LAB — MAGNESIUM: MAGNESIUM: 1.6 mg/dL (ref 1.5–2.5)

## 2018-05-16 MED ORDER — TRAZODONE HCL 50 MG PO TABS: 50.0000 mg | ORAL_TABLET | Freq: Every evening | ORAL | 3 refills | Status: DC | PRN

## 2018-05-16 MED ORDER — TUBERCULIN PPD 5 UNIT/0.1ML ID SOLN: 5.0000 [IU] | Freq: Once | INTRADERMAL | Status: DC

## 2018-05-16 MED ORDER — DICYCLOMINE HCL 20 MG PO TABS
20.0000 mg | ORAL_TABLET | Freq: Three times a day (TID) | ORAL | 1 refills | Status: DC
Start: 2018-05-16 — End: 2018-07-05

## 2018-05-16 MED ORDER — RANITIDINE HCL 150 MG PO TABS: 150.0000 mg | ORAL_TABLET | Freq: Two times a day (BID) | ORAL | 3 refills | Status: DC

## 2018-05-16 NOTE — Progress Notes (Signed)
Stacey Holland is a 22 y.o. female with the following history as recorded in EpicCare:  Patient Active Problem List   Diagnosis Date Noted  . Sore throat 03/14/2018  . Anorexia nervosa   . Eating disorder 12/01/2017  . Attention and concentration deficit 12/01/2017  . Chronic idiopathic constipation 12/01/2017  . Vomiting associated with bulimia nervosa with nausea 06/22/2017  . Hypomagnesemia 09/25/2016  . Hypokalemia 09/18/2016  . Cough 09/17/2016  . Routine general medical examination at a health care facility 08/26/2016  . Atypical lymphocytosis 08/25/2016  . PCOS (polycystic ovarian syndrome) 04/03/2016  . Essential hypertension 02/20/2015  . GE reflux     Current Outpatient Medications  Medication Sig Dispense Refill  . acetaminophen (TYLENOL) 500 MG tablet Take 1,000 mg by mouth every 8 (eight) hours as needed for moderate pain.     . Biotin 10000 MCG TABS Take 10,000 mcg by mouth daily.    [name] escitalopram (LEXAPRO) 10 MG tablet escitalopram 10 mg tablet  TAKE 2 TABLETS BY MOUTH EVERY DAY    . fluticasone (FLONASE) 50 MCG/ACT nasal spray Place 2 sprays into both nostrils daily. (Patient taking differently: Place 2 sprays into both nostrils as needed for allergies or rhinitis. ) 16 g 6  . lisdexamfetamine (VYVANSE) 20 MG capsule Take 1 capsule (20 mg total) by mouth daily. 30 capsule 0  . Multiple Vitamin (MULTIVITAMIN) tablet Take 1 tablet by mouth daily.    . naproxen (NAPROSYN) 500 MG tablet Take 1,000 mg by mouth daily.   0  . Norgestimate-Ethinyl Estradiol Triphasic (TRI-PREVIFEM) 0.18/0.215/0.25 MG-35 MCG tablet Take 1 tablet by mouth at bedtime.    [name] omeprazole (PRILOSEC) 20 MG capsule Take 1 capsule (20 mg total) by mouth daily. 90 capsule 1  . promethazine (PHENERGAN) 25 MG tablet Take 1 tablet (25 mg total) by mouth every 8 (eight) hours as needed for nausea or vomiting. 35 tablet 0  . propranolol (INDERAL) 20 MG tablet Take 1 tablet (20 mg total) by mouth 2 (two)  times daily. 60 tablet 11  . traZODone (DESYREL) 50 MG tablet Take 1 tablet (50 mg total) by mouth at bedtime as needed for sleep. May take 2nd tablet at night as directed 60 tablet 3  . clotrimazole-betamethasone (LOTRISONE) cream Apply 1 application topically 2 (two) times daily.   0  . dicyclomine (BENTYL) 20 MG tablet Take 1 tablet (20 mg total) by mouth 4 (four) times daily -  before meals and at bedtime. 30 tablet 1  . linaCLOtide (LINZESS PO) Take 1 tablet by mouth daily as needed (constipation).    [name] OVER THE COUNTER MEDICATION Take 500 mg by mouth daily. CBD Oil    . ranitidine (ZANTAC) 150 MG tablet Take 1 tablet (150 mg total) by mouth 2 (two) times daily. 60 tablet 3   Current Facility-Administered Medications  Medication Dose Route Frequency Provider Last Rate Last Dose  . tuberculin injection 5 Units  5 Units Intradermal Once Marrian Salvage, FNP        Allergies: Guanfacine; Nortriptyline; Pineapple; and Other  Past Medical History:  Diagnosis Date  . Anorexia nervosa   . Dyspnea    breathing heavyeven when sitting, lying dpwn,exertion  . GERD (gastroesophageal reflux disease)   . Headache   . History of blood transfusion    prematurity, this was during perinatal period  . Hypertension   . Morbid obesity ([place]) 04/03/2016  . PCOS (polycystic ovarian syndrome) 04/03/2016  . Reflux   . Syncope  Past Surgical History:  Procedure Laterality Date  . CHOLECYSTECTOMY N/A 11/25/2016   Procedure: LAPAROSCOPIC CHOLECYSTECTOMY;  Surgeon: Ralene Ok, MD;  Location: [facility];  Service: General;  Laterality: N/A;  . ESOPHAGOGASTRODUODENOSCOPY N/A 09/26/2016   Procedure: ESOPHAGOGASTRODUODENOSCOPY (EGD);  Surgeon: Jerene Bears, MD;  Location: [facility];  Service: Endoscopy;  Laterality: N/A;  . eustachion tubes      Family History  Problem Relation Age of Onset  . GER disease Mother   . Hypertension Mother   . GER disease Father   . Hypertension Father   . Lactose  intolerance Brother   . Hypertension Maternal Grandfather   . Diabetes Maternal Grandfather   . Stroke Maternal Grandfather   . Diabetes Paternal Uncle   . Cancer Neg Hx   . Depression Neg Hx   . Drug abuse Neg Hx   . Early death Neg Hx   . Heart disease Neg Hx   . Hyperlipidemia Neg Hx   . Kidney disease Neg Hx   . Alcohol abuse Neg Hx   . Asthma Neg Hx     Social History   Tobacco Use  . Smoking status: Never Smoker  . Smokeless tobacco: Never Used  Substance Use Topics  . Alcohol use: No    Subjective:  Patient is accompanied by her mother today; will be going back to inpatient therapy for treatment of her eating disorder; [facility] eating disorder clinic [place] in early June but has realized that does need and will benefit from inpatient treatment; will be going to [facility] in [place] and brings necessary paperwork; labs need to be done/ EKG done in March 2019/ PPD will need to be done again- patient is able to follow-up on Thursday morning to have this read; Also requesting for updated prescriptions for Zantac, Bentyl to help with her chronic stomach issues; went back to the ER in mid-June with another episode of abdominal pain; exam at that time was unremarkable; needs a refill on her Trazodone as well- currently takes 1-2 tablets per night and needs prescription adjusted accordingly;  Objective:  Vitals:   05/16/18 0921  BP: 102/62  Pulse: 96  Temp: 98.3 F (36.8 C)  TempSrc: Oral  SpO2: 99%  Weight: 151 lb (68.5 kg)  Height: '5\' 4"'$  (1.626 m)    General: Well developed, well nourished, in no acute distress  Skin : Warm and dry.  Head: Normocephalic and atraumatic  Lungs: Respirations unlabored; clear to auscultation bilaterally without wheeze, rales, rhonchi  CVS exam: normal rate and regular rhythm.  Abdomen: Soft; nontender; nondistended; normoactive bowel sounds; no masses or hepatosplenomegaly  Musculoskeletal: No deformities; no active joint  inflammation  Extremities: No edema, cyanosis, clubbing  Vessels: Symmetric bilaterally  Neurologic: Alert and oriented; speech intact; face symmetrical; moves all extremities well; CNII-XII intact without focal deficit  Assessment:  1. Abdominal pain, unspecified abdominal location   2. Eating disorder, unspecified type   3. Need for vaccination     Plan:  Labs and refills updated as requested; PPD is placed as required for upcoming residential stay; encouraged patient to complete treatment as ordered at [facility]- hopeful that she will get the help she needs at this facility; follow-up with her PCP as needed after discharge.  Return in about 2 days (around 05/18/2018) for nurse visit schedule for TB read.  Orders Placed This Encounter  Procedures  . CBC w/Diff    Standing Status:   Future    Standing Expiration  Date:   05/16/2019  . Comp Met (CMET)    Standing Status:   Future    Standing Expiration Date:   05/16/2019  . Phosphorus    Standing Status:   Future    Standing Expiration Date:   05/16/2019  . Magnesium    Standing Status:   Future    Standing Expiration Date:   05/16/2019  . Amylase    Standing Status:   Future    Standing Expiration Date:   05/16/2019  . Lipase    Standing Status:   Future    Standing Expiration Date:   05/16/2019  . Hepatitis panel, acute    Standing Status:   Future    Standing Expiration Date:   05/17/2019    Requested Prescriptions   Signed Prescriptions Disp Refills  . dicyclomine (BENTYL) 20 MG tablet 30 tablet 1    Sig: Take 1 tablet (20 mg total) by mouth 4 (four) times daily -  before meals and at bedtime.  . traZODone (DESYREL) 50 MG tablet 60 tablet 3    Sig: Take 1 tablet (50 mg total) by mouth at bedtime as needed for sleep. May take 2nd tablet at night as directed  . ranitidine (ZANTAC) 150 MG tablet 60 tablet 3    Sig: Take 1 tablet (150 mg total) by mouth 2 (two) times daily.

## 2018-05-17 ENCOUNTER — Ambulatory Visit (HOSPITAL_COMMUNITY): Payer: BLUE CROSS/BLUE SHIELD | Admitting: Licensed Clinical Social Worker

## 2018-05-17 LAB — HEPATITIS PANEL, ACUTE
HEP A IGM: NONREACTIVE
Hep B C IgM: NONREACTIVE
Hepatitis B Surface Ag: NONREACTIVE
Hepatitis C Ab: NONREACTIVE
SIGNAL TO CUT-OFF: 0.02 (ref <1.00)

## 2018-05-19 LAB — TB SKIN TEST
INDURATION: 0 mm
TB Skin Test: NEGATIVE

## 2018-05-19 NOTE — Addendum Note (Signed)
Addended by: Alonna MiniumWILLIAMS, Bernardette Waldron P on: 05/19/2018 09:12 AM   Modules accepted: Orders

## 2018-05-22 ENCOUNTER — Other Ambulatory Visit: Payer: Self-pay | Admitting: Internal Medicine

## 2018-05-22 DIAGNOSIS — F509 Eating disorder, unspecified: Secondary | ICD-10-CM

## 2018-05-24 ENCOUNTER — Telehealth: Payer: Self-pay | Admitting: Family

## 2018-05-24 NOTE — Telephone Encounter (Signed)
Spoke with patient and info given. I re-faxed her paperwork twice this morning and asked that she call [facility] to make sure they received it. I told her if they didn't to please let me know and I would resend it again for her.

## 2018-05-24 NOTE — Telephone Encounter (Signed)
Copied from CRM [phone]#[id number]. Topic: Inquiry >> May 20, 2018  1:26 PM Yvonna Alanisobinson, Andra M wrote: Reason for CRM: Patient called stating that she needs Ria ClockLaura Murray to complete her paperwork for the [facility] today. Patient asks that Ria ClockLaura Murray call her today at [phone].  >> May 20, 2018  2:36 PM Peace, Tammy L wrote: Forms were faxed this morning by Albin Fellingarla.  I have notified patient.   Patient called and said that [facility] did not get the paperwork and she would like for it to be re-faxed to them please.

## 2018-05-26 LAB — TB SKIN TEST
Induration: 0 mm
TB SKIN TEST: NEGATIVE

## 2018-05-26 NOTE — Addendum Note (Signed)
Addended by: Karma GanjaSMITH, Avice Funchess J on: 05/26/2018 05:02 PM   Modules accepted: Orders

## 2018-05-30 ENCOUNTER — Telehealth: Payer: Self-pay | Admitting: *Deleted

## 2018-05-30 ENCOUNTER — Ambulatory Visit: Payer: Self-pay | Admitting: Neurology

## 2018-05-30 NOTE — Telephone Encounter (Signed)
No showed follow up appointment. 

## 2018-06-01 ENCOUNTER — Encounter: Payer: Self-pay | Admitting: Neurology

## 2018-07-05 ENCOUNTER — Ambulatory Visit: Payer: BLUE CROSS/BLUE SHIELD | Admitting: Internal Medicine

## 2018-07-05 ENCOUNTER — Encounter: Payer: Self-pay | Admitting: Internal Medicine

## 2018-07-05 VITALS — BP 122/80 | HR 91 | Temp 98.2°F | Resp 16 | Ht 64.0 in | Wt 168.0 lb

## 2018-07-05 DIAGNOSIS — Z23 Encounter for immunization: Secondary | ICD-10-CM

## 2018-07-05 DIAGNOSIS — F410 Panic disorder [episodic paroxysmal anxiety] without agoraphobia: Secondary | ICD-10-CM | POA: Diagnosis not present

## 2018-07-05 MED ORDER — BUSPIRONE HCL 15 MG PO TABS: 15.0000 mg | ORAL_TABLET | Freq: Three times a day (TID) | ORAL | 3 refills | Status: DC

## 2018-07-05 MED ORDER — CLONAZEPAM 0.5 MG PO TABS: 0.5000 mg | ORAL_TABLET | Freq: Two times a day (BID) | ORAL | 3 refills | Status: DC | PRN

## 2018-07-05 NOTE — Patient Instructions (Signed)
Panic Attack A panic attack is a sudden episode of severe anxiety, fear, or discomfort that causes physical and emotional symptoms. The attack may be in response to something frightening, or it may occur for no known reason. Symptoms of a panic attack can be similar to symptoms of a heart attack or stroke. It is important to see your health care provider when you have a panic attack so that these conditions can be ruled out. A panic attack is a symptom of another condition. Most panic attacks go away with treatment of the underlying problem. If you have panic attacks often, you may have a condition called panic disorder. What are the causes? A panic attack may be caused by:  An extreme, life-threatening situation, such as a war or natural disaster.  An anxiety disorder, such as post-traumatic stress disorder.  Depression.  Certain medical conditions, including heart problems, neurological conditions, and infections.  Certain over-the-counter and prescription medicines.  Illegal drugs that increase heart rate and blood pressure, such as methamphetamine.  Alcohol.  Supplements that increase anxiety.  Panic disorder.  What increases the risk? You are more likely to develop this condition if:  You have an anxiety disorder.  You have another mental health condition.  You take certain medicines.  You use alcohol, illegal drugs, or other substances.  You are under extreme stress.  A life event is causing increased feelings of anxiety and depression.  What are the signs or symptoms? A panic attack starts suddenly, usually lasts about 20 minutes, and occurs with one or more of the following:  A pounding heart.  A feeling that your heart is beating irregularly or faster than normal (palpitations).  Sweating.  Trembling or shaking.  Shortness of breath or feeling smothered.  Feeling choked.  Chest pain or discomfort.  Nausea or a strange feeling in your  stomach.  Dizziness, feeling lightheaded, or feeling like you might faint.  Chills or hot flashes.  Numbness or tingling in your lips, hands, or feet.  Feeling confused, or feeling that you are not yourself.  Fear of losing control or being emotionally unstable.  Fear of dying.  How is this diagnosed? A panic attack is diagnosed with an assessment by your health care provider. During the assessment your health care provider will ask questions about:  Your history of anxiety, depression, and panic attacks.  Your medical history.  Whether you drink alcohol, use illegal drugs, take supplements, or take medicines. Be honest about your substance use.  Your health care provider may also:  Order blood tests or other kinds of tests to rule out serious medical conditions.  Refer you to a mental health professional for further evaluation.  How is this treated? Treatment depends on the cause of the panic attack:  If the cause is a medical problem, your health care provider will either treat that problem or refer you to a specialist.  If the cause is emotional, you may be given anti-anxiety medicines or referred to a counselor. These medicines may reduce how often attacks happen, reduce how severe the attacks are, and lower anxiety.  If the cause is a medicine, your health care provider may tell you to stop the medicine, change your dose, or take a different medicine.  If the cause is a drug, treatment may involve letting the drug wear off and taking medicine to help the drug leave your body or to counteract its effects. Attacks caused by drug abuse may continue even if you stop using   the drug.  Follow these instructions at home:  Take over-the-counter and prescription medicines only as told by your health care provider.  If you feel anxious, limit your caffeine intake.  Take good care of your physical and mental health by: ? Eating a balanced diet that includes plenty of fresh  fruits and vegetables, whole grains, lean meats, and low-fat dairy. ? Getting plenty of rest. Try to get 7-8 hours of uninterrupted sleep each night. ? Exercising regularly. Try to get 30 minutes of physical activity at least 5 days a week. ? Not smoking. Talk to your health care provider if you need help quitting. ? Limiting alcohol intake to no more than 1 drink a day for nonpregnant women and 2 drinks a day for men. One drink equals 12 oz of beer, 5 oz of wine, or 1 oz of hard liquor.  Keep all follow-up visits as told by your health care provider. This is important. Panic attacks may have underlying physical or emotional problems that take time to accurately diagnose. Contact a health care provider if:  Your symptoms do not improve, or they get worse.  You are not able to take your medicine as prescribed because of side effects. Get help right away if:  You have serious thoughts about hurting yourself or others.  You have symptoms of a panic attack. Do not drive yourself to the hospital. Have someone else drive you or call an ambulance. If you ever feel like you may hurt yourself or others, or you have thoughts about taking your own life, get help right away. You can go to your nearest emergency department or call:  Your local emergency services (911 in the [place]  A suicide crisis helpline, such as the National Suicide Prevention Lifeline at 1-800-273-8255. This is open 24 hours a day.  Summary  A panic attack is a sign of a serious health or mental health condition. Get help right away. Do not drive yourself to the hospital. Have someone else drive you or call an ambulance.  Always see a health care provider to have the reasons for the panic attack correctly diagnosed.  If your panic attack was caused by a physical problem, follow your health care provider's suggestions for medicine, referral to a specialist, and lifestyle changes.  If your panic attack was caused by an  emotional problem, follow through with counseling from a qualified mental health specialist.  If you feel like you may hurt yourself or others, call 911 and get help right away. This information is not intended to replace advice given to you by your health care provider. Make sure you discuss any questions you have with your health care provider. Document Released: 10/26/2005 Document Revised: 12/04/2016 Document Reviewed: 12/04/2016 Elsevier Interactive Patient Education  2018 Elsevier Inc.  

## 2018-07-05 NOTE — Progress Notes (Signed)
Subjective:  Patient ID: Stacey Holland, female    DOB: 1996/04/07  Age: 22 y.o. MRN: [id number]  CC: Depression   HPI Stacey Holland presents for f/up - she complains of intermittent, overwhelming sensation of anxiety and panic.  She restarted school last week and had to leave the classroom a couple times due to panic attacks.  She tells me that she stayed in a residential rehab treatment in [place] for the month of July called [facility] for the treatment of eating disorders.  She tells me she has improved along those lines and has been able to regain some of her weight.  She complained of anxiety during her rehab stay and they placed on BuSpar 10 mg TID.  She has not noticed much benefit from that and wants to increase to higher dose.  She tells me she is compliant with trazodone and Lexapro.  She also complains of crying spells and feeling hopeless.  She denies a sensation of helplessness and denies suicidal or homicidal ideations.  She tells me that someone else is prescribing Adderall for her for ADHD.  Outpatient Medications Prior to Visit  Medication Sig Dispense Refill  . acetaminophen (TYLENOL) 500 MG tablet Take 1,000 mg by mouth every 8 (eight) hours as needed for moderate pain.     . clindamycin (CLEOCIN T) 1 % lotion clindamycin 1 % lotion  APPLY TOPICALLY 2 TIMES DAILY.    . clotrimazole-betamethasone (LOTRISONE) cream Apply 1 application topically 2 (two) times daily.   0  . escitalopram (LEXAPRO) 10 MG tablet escitalopram 10 mg tablet  TAKE 2 TABLETS BY MOUTH EVERY DAY    . fluticasone (FLONASE) 50 MCG/ACT nasal spray Place 2 sprays into both nostrils daily. (Patient taking differently: Place 2 sprays into both nostrils as needed for allergies or rhinitis. ) 16 g 6  . Multiple Vitamin (MULTIVITAMIN) tablet Take 1 tablet by mouth daily.    . naproxen (NAPROSYN) 500 MG tablet Take 1,000 mg by mouth daily.   0  . Norgestimate-Ethinyl Estradiol Triphasic (TRI-PREVIFEM)  0.18/0.215/0.25 MG-35 MCG tablet Take 1 tablet by mouth at bedtime.    [name] omeprazole (PRILOSEC) 20 MG capsule Take 1 capsule (20 mg total) by mouth daily. 90 capsule 1  . OVER THE COUNTER MEDICATION Take 500 mg by mouth daily. CBD Oil    . propranolol (INDERAL) 20 MG tablet Take 1 tablet (20 mg total) by mouth 2 (two) times daily. 60 tablet 11  . ranitidine (ZANTAC) 150 MG tablet Take 1 tablet (150 mg total) by mouth 2 (two) times daily. 60 tablet 3  . traZODone (DESYREL) 50 MG tablet Take 1 tablet (50 mg total) by mouth at bedtime as needed for sleep. May take 2nd tablet at night as directed 60 tablet 3  . ADDERALL XR 20 MG 24 hr capsule   0  . Biotin 40981 MCG TABS Take 10,000 mcg by mouth daily.    . busPIRone (BUSPAR) 10 MG tablet   10  . linaCLOtide (LINZESS PO) Take 1 tablet by mouth daily as needed (constipation).    [name] lisdexamfetamine (VYVANSE) 20 MG capsule Take 1 capsule (20 mg total) by mouth daily. 30 capsule 0  . promethazine (PHENERGAN) 25 MG tablet Take 1 tablet (25 mg total) by mouth every 8 (eight) hours as needed for nausea or vomiting. 35 tablet 0  . dicyclomine (BENTYL) 20 MG tablet Take 1 tablet (20 mg total) by mouth 4 (four) times daily -  before meals  and at bedtime. 30 tablet 1   No facility-administered medications prior to visit.     ROS Review of Systems  Constitutional: Negative for diaphoresis, fatigue and unexpected weight change.  HENT: Negative.   Eyes: Negative for visual disturbance.  Respiratory: Negative for cough, chest tightness, shortness of breath and wheezing.   Cardiovascular: Negative for chest pain, palpitations and leg swelling.  Gastrointestinal: Negative for abdominal pain, constipation, diarrhea, nausea and vomiting.  Genitourinary: Negative.  Negative for difficulty urinating.  Musculoskeletal: Negative.  Negative for arthralgias and myalgias.  Skin: Negative.  Negative for color change.  Psychiatric/Behavioral: Positive for decreased  concentration and dysphoric mood. Negative for agitation, behavioral problems, confusion, hallucinations, self-injury, sleep disturbance and suicidal ideas. The patient is nervous/anxious. The patient is not hyperactive.     Objective:  BP 122/80 (BP Location: Left Arm, Patient Position: Sitting, Cuff Size: Normal)   Pulse 91   Temp 98.2 F (36.8 C) (Oral)   Resp 16   Ht 5\' 4"  (1.626 m)   Wt 168 lb (76.2 kg)   LMP 06/15/2018   SpO2 99%   BMI 28.84 kg/m   BP Readings from Last 3 Encounters:  07/05/18 122/80  05/16/18 102/62  04/21/18 116/85    Wt Readings from Last 3 Encounters:  07/05/18 168 lb (76.2 kg)  05/16/18 151 lb (68.5 kg)  04/21/18 150 lb (68 kg)    Physical Exam  Constitutional: She is oriented to person, place, and time. No distress.  HENT:  Mouth/Throat: Oropharynx is clear and moist. No oropharyngeal exudate.  Eyes: Conjunctivae are normal. No scleral icterus.  Neck: Normal range of motion. Neck supple. No JVD present. No thyromegaly present.  Cardiovascular: Normal rate and regular rhythm. Exam reveals no gallop and no friction rub.  No murmur heard. Pulmonary/Chest: Effort normal and breath sounds normal. No respiratory distress. She has no wheezes. She has no rhonchi. She has no rales.  Abdominal: Soft. Normal appearance and bowel sounds are normal. She exhibits no mass. There is no hepatosplenomegaly. There is no tenderness.  Musculoskeletal: Normal range of motion. She exhibits no edema, tenderness or deformity.  Lymphadenopathy:    She has no cervical adenopathy.  Neurological: She is alert and oriented to person, place, and time.  Skin: Skin is warm and dry. She is not diaphoretic. No pallor.  Psychiatric: Judgment and thought content normal. Her mood appears anxious. Her affect is not labile and not inappropriate. Her speech is not rapid and/or pressured, not delayed and not tangential. She is hyperactive. She is not agitated, not aggressive, not slowed  and not withdrawn. Thought content is not paranoid and not delusional. Cognition and memory are normal. She expresses no homicidal and no suicidal ideation. She expresses no suicidal plans.  Mild psychomotor hyperactivity    Lab Results  Component Value Date   WBC 3.4 (L) 05/16/2018   HGB 12.1 05/16/2018   HCT 36.1 05/16/2018   PLT 245.0 05/16/2018   GLUCOSE 77 05/16/2018   CHOL 196 07/21/2016   TRIG 209 (H) 07/21/2016   HDL 39 (L) 07/21/2016   LDLCALC 115 (H) 07/21/2016   ALT 10 05/16/2018   AST 14 05/16/2018   NA 137 05/16/2018   K 4.1 05/16/2018   CL 102 05/16/2018   CREATININE 0.80 05/16/2018   BUN 19 05/16/2018   CO2 28 05/16/2018   TSH 0.58 12/01/2017   HGBA1C 5.2 07/27/2016    Ct Abdomen Pelvis W Contrast  Result Date: 04/21/2018 CLINICAL DATA:  Diffuse  abdominal pain for the past 2 days. EXAM: CT ABDOMEN AND PELVIS WITH CONTRAST TECHNIQUE: Multidetector CT imaging of the abdomen and pelvis was performed using the standard protocol following bolus administration of intravenous contrast. CONTRAST:  100mL ISOVUE-300 IOPAMIDOL (ISOVUE-300) INJECTION 61% COMPARISON:  CT abdomen pelvis dated April 09, 2018. FINDINGS: Lower chest: Minimal bibasilar atelectasis. Hepatobiliary: Unchanged focal fat along the falciform ligament. No other focal liver abnormality. Unchanged mild periportal edema. Status post cholecystectomy. No biliary dilatation. Pancreas: Unremarkable. No pancreatic ductal dilatation or surrounding inflammatory changes. Spleen: Normal in size without focal abnormality. Adrenals/Urinary Tract: Adrenal glands are unremarkable. Kidneys are normal, without renal calculi, focal lesion, or hydronephrosis. Bladder is unremarkable. Stomach/Bowel: Stomach is within normal limits. Appendix appears normal. No evidence of bowel wall thickening, distention, or inflammatory changes. Vascular/Lymphatic: No significant vascular findings are present. No enlarged abdominal or pelvic lymph  nodes. Reproductive: Uterus and bilateral adnexa are unremarkable. Other: Trace free fluid in the pelvis, likely physiologic. No pneumoperitoneum. Musculoskeletal: No acute or significant osseous findings. Unchanged 7 mm anterolisthesis at L5-S1. Unchanged chronic mild superior endplate compression deformity of T11. IMPRESSION: 1.  No acute intra-abdominal process. Electronically Signed   By: Obie DredgeWilliam T Derry M.D.   On: 04/21/2018 17:24    Assessment & Plan:   Stacey Grainuha was seen today for depression.  Diagnoses and all orders for this visit:  Need for influenza vaccination -     Flu Vaccine QUAD 36+ mos IM  Panic anxiety syndrome- I have encouraged her to stop taking the stimulant medication.  Will increase the dose of BuSpar to 15 mg 3 times a day.  She is severely affected by symptoms of anxiety and panic so will offer a benzodiazepine for symptom relief.  She was encouraged to continue compliance with trazodone and Lexapro. -     busPIRone (BUSPAR) 15 MG tablet; Take 1 tablet (15 mg total) by mouth 3 (three) times daily. -     clonazePAM (KLONOPIN) 0.5 MG tablet; Take 1 tablet (0.5 mg total) by mouth 2 (two) times daily as needed for anxiety.   I have discontinued Eastyn Z. Carswell's lisdexamfetamine, promethazine, Biotin, linaCLOtide (LINZESS PO), dicyclomine, ADDERALL XR, and busPIRone. I am also having her start on busPIRone and clonazePAM. Additionally, I am having her maintain her Norgestimate-Ethinyl Estradiol Triphasic, multivitamin, omeprazole, fluticasone, acetaminophen, propranolol, naproxen, clotrimazole-betamethasone, OVER THE COUNTER MEDICATION, escitalopram, traZODone, ranitidine, and clindamycin.  Meds ordered this encounter  Medications  . busPIRone (BUSPAR) 15 MG tablet    Sig: Take 1 tablet (15 mg total) by mouth 3 (three) times daily.    Dispense:  90 tablet    Refill:  3  . clonazePAM (KLONOPIN) 0.5 MG tablet    Sig: Take 1 tablet (0.5 mg total) by mouth 2 (two) times daily  as needed for anxiety.    Dispense:  60 tablet    Refill:  3     Follow-up: Return in about 3 months (around 10/05/2018).  Sanda Lingerhomas Braylyn Eye, MD

## 2018-07-08 ENCOUNTER — Other Ambulatory Visit: Payer: Self-pay | Admitting: Internal Medicine

## 2018-07-08 DIAGNOSIS — E876 Hypokalemia: Secondary | ICD-10-CM

## 2018-07-11 ENCOUNTER — Encounter: Payer: Self-pay | Admitting: Internal Medicine

## 2018-07-12 ENCOUNTER — Encounter: Payer: Self-pay | Admitting: Family

## 2018-07-12 ENCOUNTER — Ambulatory Visit: Payer: BLUE CROSS/BLUE SHIELD | Admitting: Family

## 2018-07-12 DIAGNOSIS — F410 Panic disorder [episodic paroxysmal anxiety] without agoraphobia: Secondary | ICD-10-CM

## 2018-07-12 MED ORDER — CLONAZEPAM 1 MG PO TABS: 1.0000 mg | ORAL_TABLET | Freq: Two times a day (BID) | ORAL | 0 refills | Status: DC | PRN

## 2018-07-12 MED ORDER — TRAZODONE HCL 100 MG PO TABS: 100.0000 mg | ORAL_TABLET | Freq: Every evening | ORAL | 3 refills | Status: DC | PRN

## 2018-07-12 NOTE — Progress Notes (Signed)
Stacey Holland is a 22 y.o. female with the following history as recorded in [employer]:  Patient Active Problem List   Diagnosis Date Noted  . Panic anxiety syndrome 07/05/2018  . Anorexia nervosa   . Attention and concentration deficit 12/01/2017  . Chronic idiopathic constipation 12/01/2017  . Vomiting associated with bulimia nervosa with nausea 06/22/2017  . Routine general medical examination at a health care facility 08/26/2016  . PCOS (polycystic ovarian syndrome) 04/03/2016  . Essential hypertension 02/20/2015  . GE reflux     Current Outpatient Medications  Medication Sig Dispense Refill  . acetaminophen (TYLENOL) 500 MG tablet Take 1,000 mg by mouth every 8 (eight) hours as needed for moderate pain.     . busPIRone (BUSPAR) 15 MG tablet Take 1 tablet (15 mg total) by mouth 3 (three) times daily. 90 tablet 3  . clonazePAM (KLONOPIN) 1 MG tablet Take 1 tablet (1 mg total) by mouth 2 (two) times daily as needed for anxiety. 60 tablet 0  . clotrimazole-betamethasone (LOTRISONE) cream Apply 1 application topically 2 (two) times daily.   0  . escitalopram (LEXAPRO) 10 MG tablet escitalopram 10 mg tablet  TAKE 2 TABLETS BY MOUTH EVERY DAY    . fluticasone (FLONASE) 50 MCG/ACT nasal spray Place 2 sprays into both nostrils daily. (Patient taking differently: Place 2 sprays into both nostrils as needed for allergies or rhinitis. ) 16 g 6  . Multiple Vitamin (MULTIVITAMIN) tablet Take 1 tablet by mouth daily.    . naproxen (NAPROSYN) 500 MG tablet Take 1,000 mg by mouth daily.   0  . Norgestimate-Ethinyl Estradiol Triphasic (TRI-PREVIFEM) 0.18/0.215/0.25 MG-35 MCG tablet Take 1 tablet by mouth at bedtime.    [name] omeprazole (PRILOSEC) 20 MG capsule Take 1 capsule (20 mg total) by mouth daily. 90 capsule 1  . OVER THE COUNTER MEDICATION Take 500 mg by mouth daily. CBD Oil    . propranolol (INDERAL) 20 MG tablet Take 1 tablet (20 mg total) by mouth 2 (two) times daily. 60 tablet 11  .  ranitidine (ZANTAC) 150 MG tablet Take 1 tablet (150 mg total) by mouth 2 (two) times daily. 60 tablet 3  . traZODone (DESYREL) 100 MG tablet Take 1 tablet (100 mg total) by mouth at bedtime as needed for sleep. 30 tablet 3   No current facility-administered medications for this visit.     Allergies: Guanfacine; Nortriptyline; Pineapple; and Other  Past Medical History:  Diagnosis Date  . Anorexia nervosa   . Dyspnea    breathing heavyeven when sitting, lying dpwn,exertion  . GERD (gastroesophageal reflux disease)   . Headache   . History of blood transfusion    prematurity, this was during perinatal period  . Hypertension   . Morbid obesity (HCC) 04/03/2016  . PCOS (polycystic ovarian syndrome) 04/03/2016  . Reflux   . Syncope     Past Surgical History:  Procedure Laterality Date  . CHOLECYSTECTOMY N/A 11/25/2016   Procedure: LAPAROSCOPIC CHOLECYSTECTOMY;  Surgeon: Axel Filler, MD;  Location: [facility] OR;  Service: General;  Laterality: N/A;  . ESOPHAGOGASTRODUODENOSCOPY N/A 09/26/2016   Procedure: ESOPHAGOGASTRODUODENOSCOPY (EGD);  Surgeon: Beverley Fiedler, MD;  Location: [facility];  Service: Endoscopy;  Laterality: N/A;  . eustachion tubes      Family History  Problem Relation Age of Onset  . GER disease Mother   . Hypertension Mother   . GER disease Father   . Hypertension Father   . Lactose intolerance Brother   . Hypertension Maternal  Grandfather   . Diabetes Maternal Grandfather   . Stroke Maternal Grandfather   . Diabetes Paternal Uncle   . Cancer Neg Hx   . Depression Neg Hx   . Drug abuse Neg Hx   . Early death Neg Hx   . Heart disease Neg Hx   . Hyperlipidemia Neg Hx   . Kidney disease Neg Hx   . Alcohol abuse Neg Hx   . Asthma Neg Hx     Social History   Tobacco Use  . Smoking status: Never Smoker  . Smokeless tobacco: Never Used  Substance Use Topics  . Alcohol use: No    Subjective:  1 week follow-up on her anxiety; saw her previous PCP last week  and was given Klonopin 0.5 mg bid in addition to Buspar 15 mg tid/ Lexapro; recently finished treatment at [facility]- residential treatment for bulimia; admits that she was supposed to schedule with therapist/ nutritionist and psychiatrist upon discharge; is scheduled to see therapist tomorrow for group therapy/ support for eating disorder; Notes that anxiety is making it difficult for her to be in class/ complete assignments on time; has already spoken to her professors; Notes that severity of anxiety is "crippling my whole life"/ "hurt all over" from the anxiety; would like to get her Klonopin Rx adjusted to 1 mg bid until she is able to see a psychiatrist; she has already been taking 1 mg bid on her own but needs Rx adjusted so she does not run out of medication early; she wonders if she could go back on Vyvanse- Adderall was stopped at last office visit due to concerns that stimulant causing worsening anxiety for her.    Objective:  Vitals:   07/12/18 1338  BP: 118/84  Pulse: 96  Temp: 98.9 F (37.2 C)  TempSrc: Oral  SpO2: 99%  Height: 5\' 4"  (1.626 m)    General: Well developed, well nourished, in no acute distress  Skin : Warm and dry.  Head: Normocephalic and atraumatic  Lungs: Respirations unlabored; Neurologic: Alert and oriented; speech intact; face symmetrical; moves all extremities well; CNII-XII intact without focal deficit  Limited physical exam as majority of visit is spent counseling Assessment:  1. Panic anxiety syndrome     Plan:  Agree to new Rx for Klonopin 1 mg bid x 1 month; she agrees and understands why she needs to see a psychiatrist; she has contact information for a psychiatrist and will schedule as soon as possible; agree with decision to hold ADHD medication at this time; continue with therapist and nutritionist as scheduled; follow-up as needed otherwise.  Spent 30 minutes with patient; greater than 50% spent in counseling;     No follow-ups on file.   No orders of the defined types were placed in this encounter.   Requested Prescriptions   Signed Prescriptions Disp Refills  . traZODone (DESYREL) 100 MG tablet 30 tablet 3    Sig: Take 1 tablet (100 mg total) by mouth at bedtime as needed for sleep.  . clonazePAM (KLONOPIN) 1 MG tablet 60 tablet 0    Sig: Take 1 tablet (1 mg total) by mouth 2 (two) times daily as needed for anxiety.

## 2018-07-12 NOTE — Patient Instructions (Signed)
Please schedule with the specialist as discussed.

## 2018-07-14 ENCOUNTER — Encounter: Payer: Self-pay | Admitting: Family

## 2018-07-15 ENCOUNTER — Other Ambulatory Visit: Payer: Self-pay | Admitting: Family

## 2018-07-15 ENCOUNTER — Encounter: Payer: Self-pay | Admitting: Family

## 2018-07-25 ENCOUNTER — Encounter: Payer: Self-pay | Admitting: Family

## 2018-07-25 ENCOUNTER — Ambulatory Visit: Payer: BLUE CROSS/BLUE SHIELD | Admitting: Family

## 2018-07-25 ENCOUNTER — Other Ambulatory Visit (INDEPENDENT_AMBULATORY_CARE_PROVIDER_SITE_OTHER): Payer: BLUE CROSS/BLUE SHIELD

## 2018-07-25 VITALS — BP 106/78 | HR 87 | Temp 98.1°F | Ht 64.0 in

## 2018-07-25 DIAGNOSIS — R109 Unspecified abdominal pain: Secondary | ICD-10-CM

## 2018-07-25 DIAGNOSIS — F509 Eating disorder, unspecified: Secondary | ICD-10-CM

## 2018-07-25 LAB — COMPREHENSIVE METABOLIC PANEL
ALBUMIN: 3.6 g/dL (ref 3.5–5.2)
ALK PHOS: 56 U/L (ref 39–117)
ALT: 16 U/L (ref 0–35)
AST: 15 U/L (ref 0–37)
BILIRUBIN TOTAL: 0.2 mg/dL (ref 0.2–1.2)
BUN: 14 mg/dL (ref 6–23)
CALCIUM: 9.2 mg/dL (ref 8.4–10.5)
CO2: 30 mEq/L (ref 19–32)
CREATININE: 0.8 mg/dL (ref 0.40–1.20)
Chloride: 104 mEq/L (ref 96–112)
GFR: 94.78 mL/min (ref >60.00)
Glucose, Bld: 95 mg/dL (ref 70–99)
Potassium: 4.2 mEq/L (ref 3.5–5.1)
Sodium: 137 mEq/L (ref 135–145)
Total Protein: 7.2 g/dL (ref 6.0–8.3)

## 2018-07-25 LAB — CBC WITH DIFFERENTIAL/PLATELET
BASOS ABS: 0 10*3/uL (ref 0.0–0.1)
Basophils Relative: 0.7 % (ref 0.0–3.0)
EOS ABS: 0.1 10*3/uL (ref 0.0–0.7)
Eosinophils Relative: 2.1 % (ref 0.0–5.0)
HEMATOCRIT: 39 % (ref 36.0–46.0)
HEMOGLOBIN: 12.9 g/dL (ref 12.0–15.0)
LYMPHS PCT: 51.2 % — AB (ref 12.0–46.0)
Lymphs Abs: 2 10*3/uL (ref 0.7–4.0)
MCHC: 33.1 g/dL (ref 30.0–36.0)
MCV: 81.3 fl (ref 78.0–100.0)
MONOS PCT: 8.3 % (ref 3.0–12.0)
Monocytes Absolute: 0.3 10*3/uL (ref 0.1–1.0)
Neutro Abs: 1.5 10*3/uL (ref 1.4–7.7)
Neutrophils Relative %: 37.7 % — ABNORMAL LOW (ref 43.0–77.0)
Platelets: 241 10*3/uL (ref 150.0–400.0)
RBC: 4.8 Mil/uL (ref 3.87–5.11)
RDW: 12.4 % (ref 11.5–15.5)
WBC: 3.9 10*3/uL — AB (ref 4.0–10.5)

## 2018-07-25 LAB — AMYLASE: AMYLASE: 108 U/L (ref 27–131)

## 2018-07-25 LAB — PHOSPHORUS: Phosphorus: 3.7 mg/dL (ref 2.3–4.6)

## 2018-07-25 LAB — MAGNESIUM: MAGNESIUM: 1.7 mg/dL (ref 1.5–2.5)

## 2018-07-25 LAB — LIPASE: Lipase: 17 U/L (ref 11.0–59.0)

## 2018-07-25 MED ORDER — OMEGA 3 340 MG PO CPDR: DELAYED_RELEASE_CAPSULE | ORAL | Status: DC

## 2018-07-25 MED ORDER — LORAZEPAM 0.5 MG PO TABS: 0.5000 mg | ORAL_TABLET | Freq: Three times a day (TID) | ORAL | 0 refills | Status: DC | PRN

## 2018-07-25 MED ORDER — MELATONIN 10 MG PO TBDP: 20.0000 mg | ORAL_TABLET | Freq: Every evening | ORAL | Status: AC

## 2018-07-25 MED ORDER — CALCIUM POLYCARBOPHIL 625 MG PO TABS: ORAL_TABLET | ORAL | Status: DC

## 2018-07-25 NOTE — Progress Notes (Signed)
Stacey Holland is a 22 y.o. female with the following history as recorded in EpicCare:  Patient Active Problem List   Diagnosis Date Noted  . Panic anxiety syndrome 07/05/2018  . Anorexia nervosa   . Attention and concentration deficit 12/01/2017  . Chronic idiopathic constipation 12/01/2017  . Vomiting associated with bulimia nervosa with nausea 06/22/2017  . Routine general medical examination at a health care facility 08/26/2016  . PCOS (polycystic ovarian syndrome) 04/03/2016  . Essential hypertension 02/20/2015  . GE reflux     Current Outpatient Medications  Medication Sig Dispense Refill  . acetaminophen (TYLENOL) 500 MG tablet Take 1,000 mg by mouth every 8 (eight) hours as needed for moderate pain.     . fluticasone (FLONASE) 50 MCG/ACT nasal spray Place 2 sprays into both nostrils daily. (Patient taking differently: Place 2 sprays into both nostrils as needed for allergies or rhinitis. ) 16 g 6  . Multiple Vitamin (MULTIVITAMIN) tablet Take 1 tablet by mouth daily.    [name] omeprazole (PRILOSEC) 20 MG capsule Take 1 capsule (20 mg total) by mouth daily. 90 capsule 1  . ranitidine (ZANTAC) 150 MG tablet Take 1 tablet (150 mg total) by mouth 2 (two) times daily. 60 tablet 3  . traZODone (DESYREL) 100 MG tablet Take 1 tablet (100 mg total) by mouth at bedtime as needed for sleep. 30 tablet 3  . LORazepam (ATIVAN) 0.5 MG tablet Take 1 tablet (0.5 mg total) by mouth every 8 (eight) hours as needed for anxiety. 10 tablet 0  . Melatonin 10 MG TBDP Take 20 mg by mouth Nightly. 30 tablet   . Norgestimate-Ethinyl Estradiol Triphasic (TRI-PREVIFEM) 0.18/0.215/0.25 MG-35 MCG tablet Take 1 tablet by mouth at bedtime.    . Omega 3 340 MG CPDR Dietary supplement; take as directed 90 capsule   . polycarbophil (FIBERCON) 625 MG tablet Use as directed     No current facility-administered medications for this visit.     Allergies: Guanfacine; Nortriptyline; Pineapple; and Other  Past Medical  History:  Diagnosis Date  . Anorexia nervosa   . Dyspnea    breathing heavyeven when sitting, lying dpwn,exertion  . GERD (gastroesophageal reflux disease)   . Headache   . History of blood transfusion    prematurity, this was during perinatal period  . Hypertension   . Morbid obesity ([place]) 04/03/2016  . PCOS (polycystic ovarian syndrome) 04/03/2016  . Reflux   . Syncope     Past Surgical History:  Procedure Laterality Date  . CHOLECYSTECTOMY N/A 11/25/2016   Procedure: LAPAROSCOPIC CHOLECYSTECTOMY;  Surgeon: Ralene Ok, MD;  Location: [facility];  Service: General;  Laterality: N/A;  . ESOPHAGOGASTRODUODENOSCOPY N/A 09/26/2016   Procedure: ESOPHAGOGASTRODUODENOSCOPY (EGD);  Surgeon: Jerene Bears, MD;  Location: [facility];  Service: Endoscopy;  Laterality: N/A;  . eustachion tubes      Family History  Problem Relation Age of Onset  . GER disease Mother   . Hypertension Mother   . GER disease Father   . Hypertension Father   . Lactose intolerance Brother   . Hypertension Maternal Grandfather   . Diabetes Maternal Grandfather   . Stroke Maternal Grandfather   . Diabetes Paternal Uncle   . Cancer Neg Hx   . Depression Neg Hx   . Drug abuse Neg Hx   . Early death Neg Hx   . Heart disease Neg Hx   . Hyperlipidemia Neg Hx   . Kidney disease Neg Hx   . Alcohol abuse Neg  Hx   . Asthma Neg Hx     Social History   Tobacco Use  . Smoking status: Never Smoker  . Smokeless tobacco: Never Used  Substance Use Topics  . Alcohol use: No    Subjective:  Patient presents with her mother today; she was in a facility for her anxiety/ eating disorder in [place]; patient left of her own choice on Saturday- notes that it was a very bad facility/ did not help her and actually made her anxiety worse; stopped all of her medications abruptly; will be going back to [facility] facility in [place] that she did very well with earlier this summer; is committed to staying as  long as necessary to get the help she needs;  Notes her anxiety is very bad today- notes that all of her medications were confiscated while she was in [place] and she is no longer taking anything for anxiety; "My anxiety is at a 10 today." She defers having any type of weight today.   Needs to get paperwork/ labs completed to re-enter [facility]; had PPD placed last week for facility in [place]- does not have those results.     Objective:  Vitals:   07/25/18 1109  BP: 106/78  Pulse: 87  Temp: 98.1 F (36.7 C)  TempSrc: Oral  SpO2: 97%  Height: '5\' 4"'$  (1.626 m)    General: Well developed, well nourished, in no acute distress  Skin : Warm and dry.  Head: Normocephalic and atraumatic  Lungs: Respirations unlabored; clear to auscultation bilaterally without wheeze, rales, rhonchi  CVS exam: normal rate and regular rhythm.  Neurologic: Alert and oriented; speech intact; face symmetrical; moves all extremities well; CNII-XII intact without focal deficit  Assessment:  1. Abdominal pain, unspecified abdominal location   2. Eating disorder, unspecified type     Plan:  Patient is planning to return to [facility] in [place] tomorrow; paperwork is completed as requested; encouraged patient to stay and complete entire course of treatment; she agrees and notes she is committed to taking care of herself. Will give #10 Ativan to use for the next 2 days to help with anxiety; she will meet with psychiatrist at [facility] immediately upon admission. Follow-up here as needed otherwise.   Spent 30 minutes with patient; greater than 50% spent in counseling;    No follow-ups on file.  Orders Placed This Encounter  Procedures  . CBC w/Diff    Standing Status:   Future    Number of Occurrences:   1    Standing Expiration Date:   07/25/2019  . Comp Met (CMET)    Standing Status:   Future    Number of Occurrences:   1    Standing Expiration Date:   07/25/2019  . Phosphorus    Standing  Status:   Future    Number of Occurrences:   1    Standing Expiration Date:   07/25/2019  . Magnesium    Standing Status:   Future    Number of Occurrences:   1    Standing Expiration Date:   07/25/2019  . Amylase    Standing Status:   Future    Number of Occurrences:   1    Standing Expiration Date:   07/25/2019  . Lipase    Standing Status:   Future    Number of Occurrences:   1    Standing Expiration Date:   07/25/2019  . Hepatitis, Acute    Standing  Status:   Future    Number of Occurrences:   1    Standing Expiration Date:   07/25/2019    Requested Prescriptions   Signed Prescriptions Disp Refills  . Melatonin 10 MG TBDP 30 tablet     Sig: Take 20 mg by mouth Nightly.  . Omega 3 340 MG CPDR 90 capsule     Sig: Dietary supplement; take as directed  . polycarbophil (FIBERCON) 625 MG tablet      Sig: Use as directed  . LORazepam (ATIVAN) 0.5 MG tablet 10 tablet 0    Sig: Take 1 tablet (0.5 mg total) by mouth every 8 (eight) hours as needed for anxiety.

## 2018-07-26 ENCOUNTER — Encounter: Payer: Self-pay | Admitting: Family

## 2018-07-26 LAB — HEPATITIS PANEL, ACUTE
HEP B C IGM: NONREACTIVE
HEP C AB: NONREACTIVE
Hep A IgM: NONREACTIVE
Hepatitis B Surface Ag: NONREACTIVE
SIGNAL TO CUT-OFF: 0.03 (ref <1.00)

## 2018-07-26 NOTE — Telephone Encounter (Signed)
Please advise 

## 2018-07-27 ENCOUNTER — Other Ambulatory Visit: Payer: Self-pay | Admitting: Family

## 2018-07-27 DIAGNOSIS — F410 Panic disorder [episodic paroxysmal anxiety] without agoraphobia: Secondary | ICD-10-CM

## 2018-07-27 MED ORDER — BUSPIRONE HCL 15 MG PO TABS: 15.0000 mg | ORAL_TABLET | Freq: Three times a day (TID) | ORAL | 0 refills | Status: DC

## 2018-07-27 MED ORDER — TRAZODONE HCL 100 MG PO TABS: 100.0000 mg | ORAL_TABLET | Freq: Every evening | ORAL | 0 refills | Status: DC | PRN

## 2018-07-27 NOTE — Telephone Encounter (Signed)
Let's put the Buspar back in place; she can take this 3x per day to help with her anxiety. Prescription has been sent for her.

## 2018-08-30 IMAGING — CT CT ABD-PELV W/ CM
2 of 4 series · 17 of 46 positions shown, 19 images · IV contrast (iopamidol)
Comparison: None.

CLINICAL DATA: Atypical lymphocytosis. Left upper quadrant pain for
several months.

EXAM:
CT ABDOMEN AND PELVIS WITH CONTRAST
TECHNIQUE: Multidetector CT imaging of the abdomen and pelvis was performed
using the standard protocol following bolus administration of
intravenous contrast.
CONTRAST:  100mL 87251N-[id number] IOPAMIDOL (87251N-4OO) INJECTION 61%

[Series 2: abd/pel w · axial · 0.88mm/px · z∈[+792,+1182]mm · 14 of 88 slices shown, 16 images]
[im 5/88  soft-tissue]
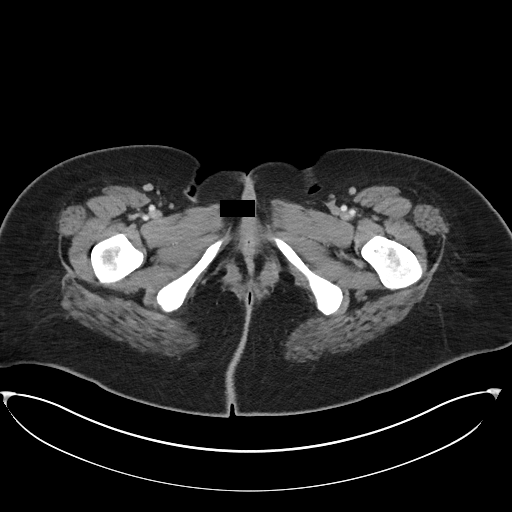
[im 5/88  bone]
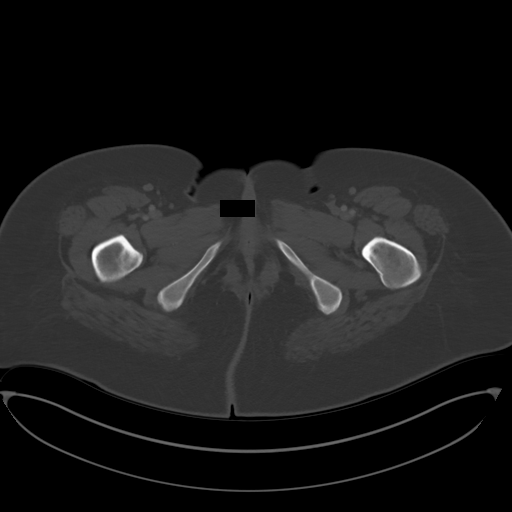
[im 13/88  soft-tissue]
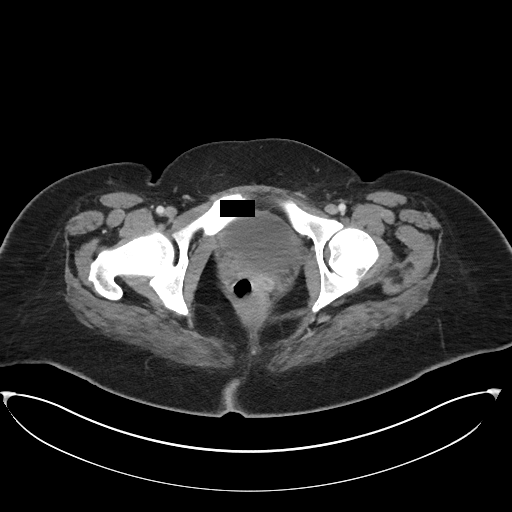
[im 17/88  soft-tissue]
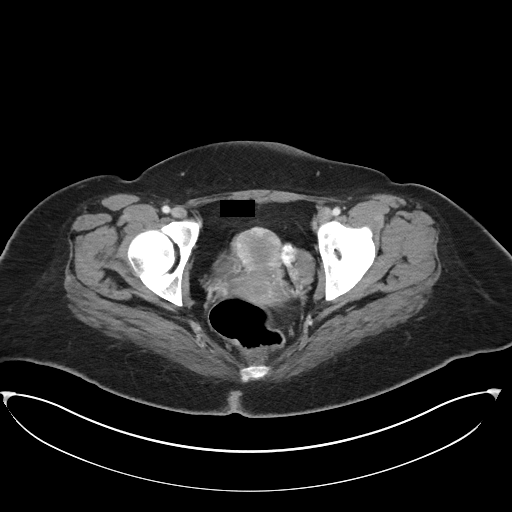
[im 25/88  soft-tissue]
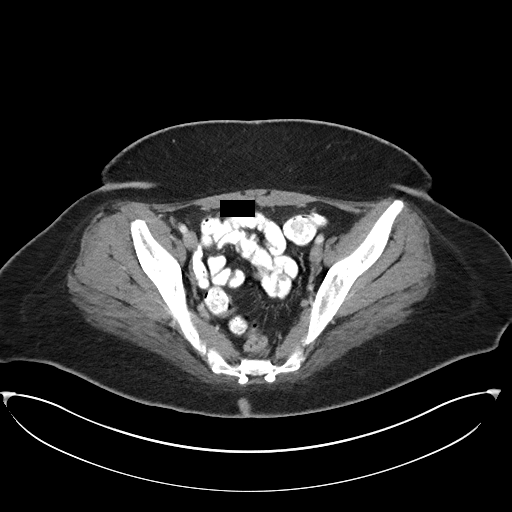
[im 30/88  soft-tissue]
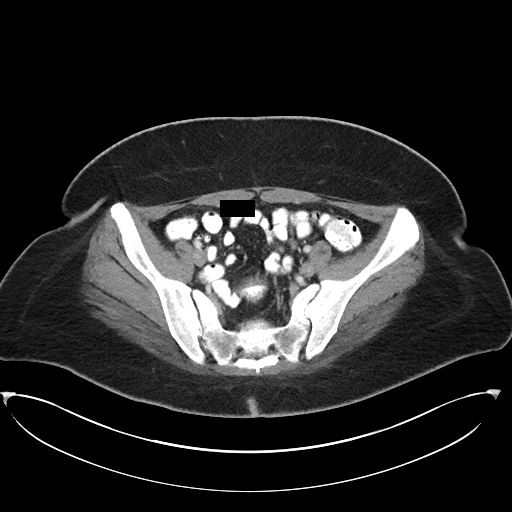
[im 34/88  soft-tissue]
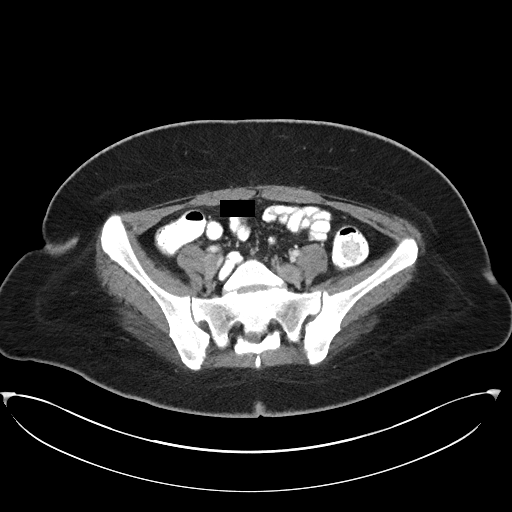
[im 42/88  soft-tissue]
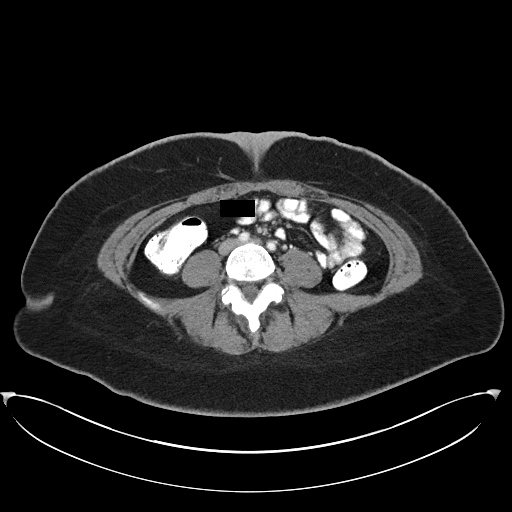
[im 46/88  soft-tissue]
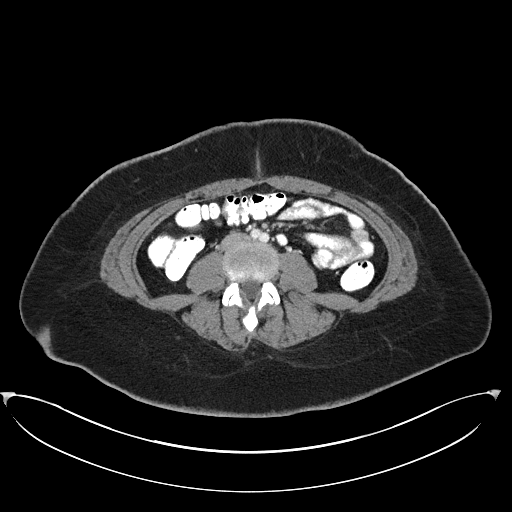
[im 54/88  soft-tissue]
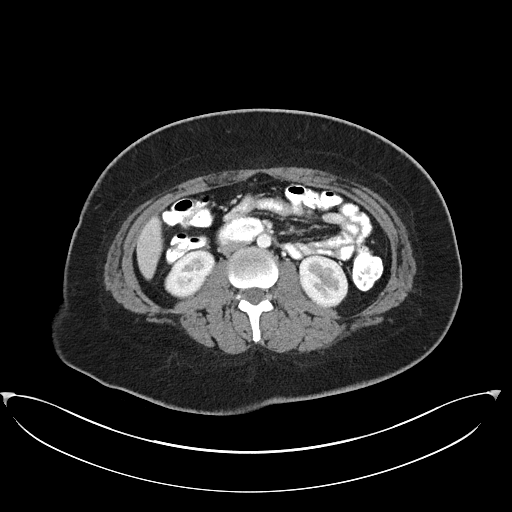
[im 54/88  bone]
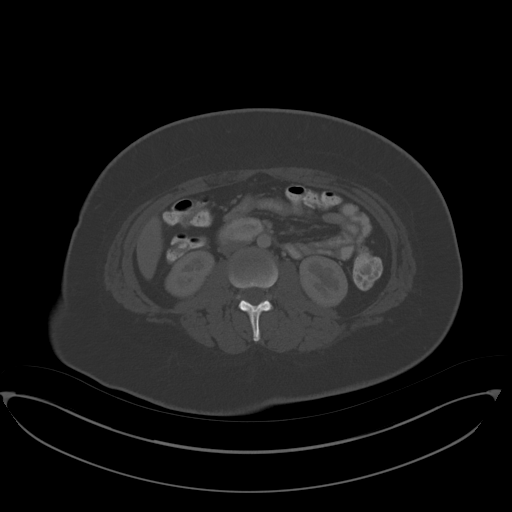
[im 59/88  soft-tissue]
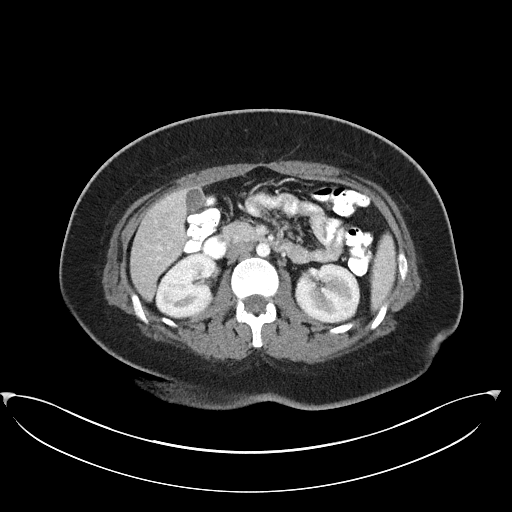
[im 67/88  soft-tissue]
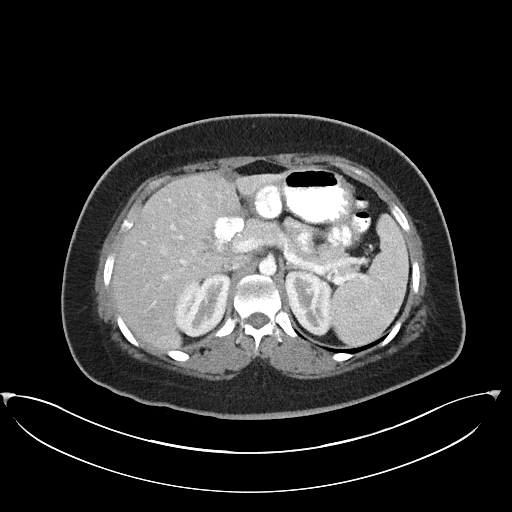
[im 71/88  soft-tissue]
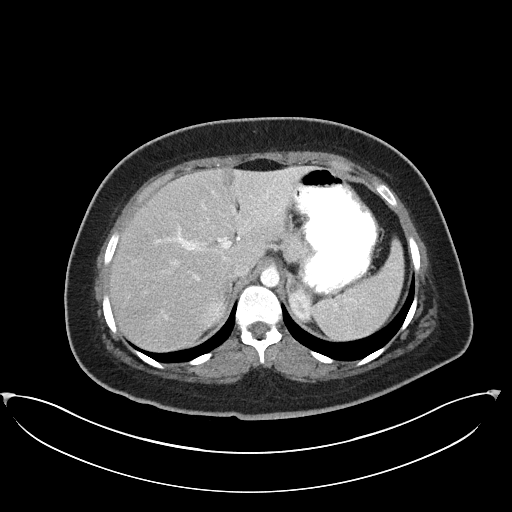
[im 75/88  soft-tissue]
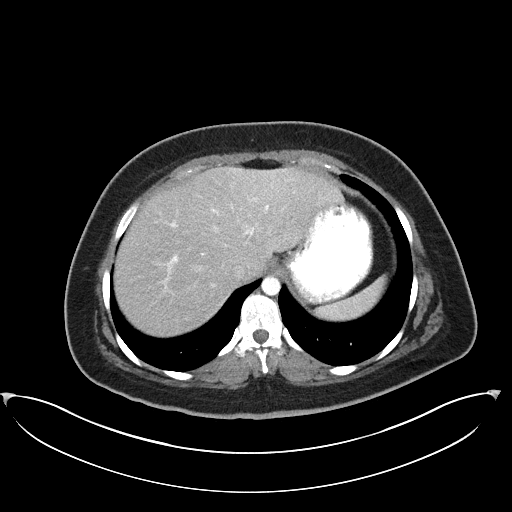
[im 83/88  soft-tissue]
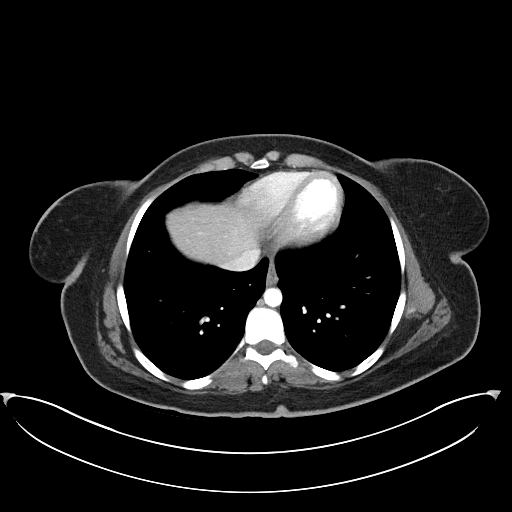

[Series 5: abd/pel w st · coronal · 0.89mm/px · 3 of 101 slices shown]
[im 34/101  soft-tissue]
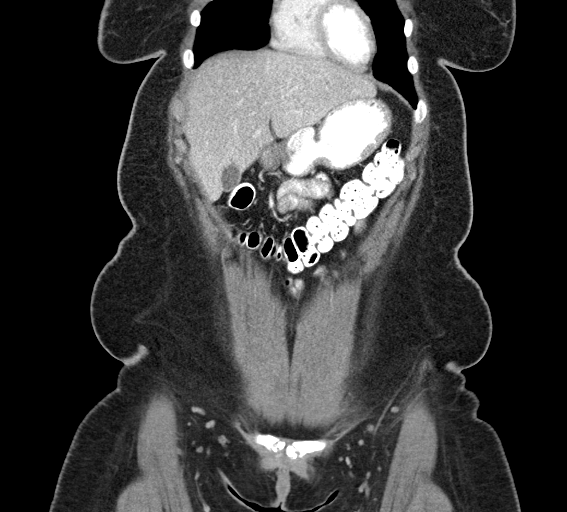
[im 45/101  soft-tissue]
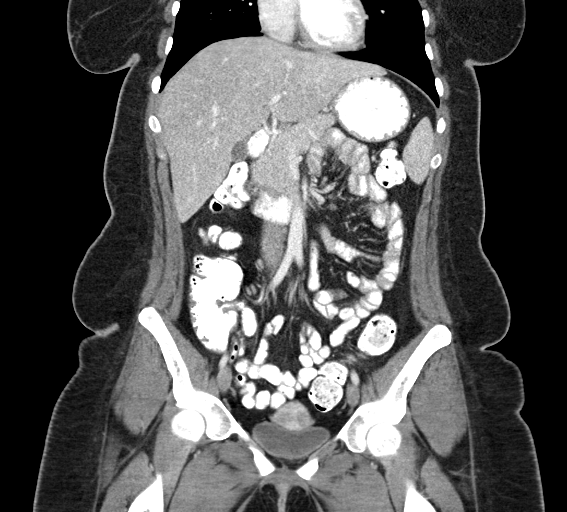
[im 56/101  soft-tissue]
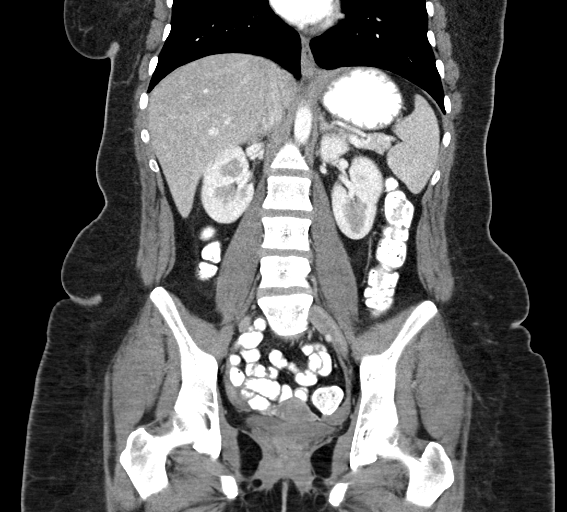

[17 of 46 positions shown; findings below may reference images not displayed]

FINDINGS: Lower Chest: No acute findings.

Hepatobiliary: No mass identified. Focal fatty infiltration adjacent
to falciform ligament. Gallbladder is unremarkable.

Pancreas:  No mass or inflammatory changes.

Spleen: Within normal limits in size and appearance.

Adrenals/Urinary Tract: No masses identified. No evidence of
hydronephrosis.

Stomach/Bowel: No evidence of obstruction, inflammatory process or
abnormal fluid collections. Normal appendix visualized.

Vascular/Lymphatic: No pathologically enlarged lymph nodes. No
abdominal aortic aneurysm.

Reproductive:  No mass identified.

Other:  None.

Musculoskeletal:  No suspicious bone lesions identified.
IMPRESSION: No acute findings within the abdomen or pelvis. No evidence of
splenomegaly or lymphadenopathy.

## 2018-09-06 ENCOUNTER — Ambulatory Visit: Payer: BLUE CROSS/BLUE SHIELD | Admitting: Family

## 2018-09-06 ENCOUNTER — Encounter: Payer: Self-pay | Admitting: Family

## 2018-09-06 VITALS — BP 110/72 | HR 102 | Temp 99.1°F | Ht 64.0 in

## 2018-09-06 DIAGNOSIS — F410 Panic disorder [episodic paroxysmal anxiety] without agoraphobia: Secondary | ICD-10-CM | POA: Diagnosis not present

## 2018-09-06 DIAGNOSIS — F509 Eating disorder, unspecified: Secondary | ICD-10-CM | POA: Diagnosis not present

## 2018-09-06 DIAGNOSIS — K219 Gastro-esophageal reflux disease without esophagitis: Secondary | ICD-10-CM

## 2018-09-06 DIAGNOSIS — F319 Bipolar disorder, unspecified: Secondary | ICD-10-CM

## 2018-09-06 DIAGNOSIS — G43809 Other migraine, not intractable, without status migrainosus: Secondary | ICD-10-CM

## 2018-09-06 MED ORDER — FAMOTIDINE 20 MG PO TABS: 20.0000 mg | ORAL_TABLET | Freq: Every day | ORAL | 1 refills | Status: DC

## 2018-09-06 MED ORDER — NAPROXEN 250 MG PO TABS: 250.0000 mg | ORAL_TABLET | Freq: Two times a day (BID) | ORAL | 0 refills | Status: DC

## 2018-09-06 MED ORDER — OMEPRAZOLE 20 MG PO CPDR: 20.0000 mg | DELAYED_RELEASE_CAPSULE | Freq: Every day | ORAL | 1 refills | Status: DC

## 2018-09-06 MED ORDER — RIZATRIPTAN BENZOATE 10 MG PO TABS: 10.0000 mg | ORAL_TABLET | ORAL | 1 refills | Status: DC | PRN

## 2018-09-06 MED ORDER — TRAZODONE HCL 100 MG PO TABS: 100.0000 mg | ORAL_TABLET | Freq: Every evening | ORAL | 0 refills | Status: DC | PRN

## 2018-09-06 MED ORDER — NORGESTIM-ETH ESTRAD TRIPHASIC 0.18/0.215/0.25 MG-35 MCG PO TABS: 1.0000 | ORAL_TABLET | Freq: Every day | ORAL | 0 refills | Status: DC

## 2018-09-06 MED ORDER — CLONAZEPAM 1 MG PO TABS: 1.0000 mg | ORAL_TABLET | Freq: Every day | ORAL | 0 refills | Status: DC | PRN

## 2018-09-06 NOTE — Progress Notes (Signed)
Stacey Holland is a 22 y.o. female with the following history as recorded in EpicCare:  Patient Active Problem List   Diagnosis Date Noted  . Panic anxiety syndrome 07/05/2018  . Anorexia nervosa   . Attention and concentration deficit 12/01/2017  . Chronic idiopathic constipation 12/01/2017  . Vomiting associated with bulimia nervosa with nausea 06/22/2017  . Routine general medical examination at a health care facility 08/26/2016  . PCOS (polycystic ovarian syndrome) 04/03/2016  . Essential hypertension 02/20/2015  . GE reflux     Current Outpatient Medications  Medication Sig Dispense Refill  . acetaminophen (TYLENOL) 500 MG tablet Take 1,000 mg by mouth every 8 (eight) hours as needed for moderate pain.     . clonazePAM (KLONOPIN) 1 MG tablet Take 1 tablet (1 mg total) by mouth daily as needed for anxiety. 30 tablet 0  . famotidine (PEPCID) 20 MG tablet Take 1 tablet (20 mg total) by mouth at bedtime. 90 tablet 1  . fluticasone (FLONASE) 50 MCG/ACT nasal spray Place 2 sprays into both nostrils daily. (Patient taking differently: Place 2 sprays into both nostrils as needed for allergies or rhinitis. ) 16 g 6  . gabapentin (NEURONTIN) 300 MG capsule   10  . Multiple Vitamin (MULTIVITAMIN) tablet Take 1 tablet by mouth daily.    . naproxen (NAPROSYN) 250 MG tablet Take 1 tablet (250 mg total) by mouth 2 (two) times daily with a meal. 60 tablet 0  . Norgestimate-Ethinyl Estradiol Triphasic (TRI-PREVIFEM) 0.18/0.215/0.25 MG-35 MCG tablet Take 1 tablet by mouth at bedtime. 1 Package 0  . Omega 3 340 MG CPDR Dietary supplement; take as directed 90 capsule   . omeprazole (PRILOSEC) 20 MG capsule Take 1 capsule (20 mg total) by mouth daily. 90 capsule 1  . polycarbophil (FIBERCON) 625 MG tablet Use as directed    . risperiDONE (RISPERDAL) 1 MG tablet   10  . rizatriptan (MAXALT) 10 MG tablet Take 1 tablet (10 mg total) by mouth as needed for migraine. 10 tablet 1  . traZODone (DESYREL) 100  MG tablet Take 1 tablet (100 mg total) by mouth at bedtime as needed for sleep. 30 tablet 0   No current facility-administered medications for this visit.     Allergies: Guanfacine; Nortriptyline; Pineapple; and Other  Past Medical History:  Diagnosis Date  . Anorexia nervosa   . Dyspnea    breathing heavyeven when sitting, lying dpwn,exertion  . GERD (gastroesophageal reflux disease)   . Headache   . History of blood transfusion    prematurity, this was during perinatal period  . Hypertension   . Morbid obesity ([facility]) 04/03/2016  . PCOS (polycystic ovarian syndrome) 04/03/2016  . Reflux   . Syncope     Past Surgical History:  Procedure Laterality Date  . CHOLECYSTECTOMY N/A 11/25/2016   Procedure: LAPAROSCOPIC CHOLECYSTECTOMY;  Surgeon: Axel Filler, MD;  Location: [facility];  Service: General;  Laterality: N/A;  . ESOPHAGOGASTRODUODENOSCOPY N/A 09/26/2016   Procedure: ESOPHAGOGASTRODUODENOSCOPY (EGD);  Surgeon: Beverley Fiedler, MD;  Location: [facility];  Service: Endoscopy;  Laterality: N/A;  . eustachion tubes      Family History  Problem Relation Age of Onset  . GER disease Mother   . Hypertension Mother   . GER disease Father   . Hypertension Father   . Lactose intolerance Brother   . Hypertension Maternal Grandfather   . Diabetes Maternal Grandfather   . Stroke Maternal Grandfather   . Diabetes Paternal Uncle   . Cancer  Neg Hx   . Depression Neg Hx   . Drug abuse Neg Hx   . Early death Neg Hx   . Heart disease Neg Hx   . Hyperlipidemia Neg Hx   . Kidney disease Neg Hx   . Alcohol abuse Neg Hx   . Asthma Neg Hx     Social History   Tobacco Use  . Smoking status: Never Smoker  . Smokeless tobacco: Never Used  Substance Use Topics  . Alcohol use: No    Subjective:  Patient is accompanied by her mother today; recently completed treatment program for eating disorder at [facility] in [place]; will be leaving for [place] tomorrow; needs to get her medications  refilled until she can get back and see her psychiatrist; has been diagnosed with Bipolar Disorder- does feel like some of the recent medication changes made have helped with the impulse control.    Objective:  Vitals:   09/06/18 1614  BP: 110/72  Pulse: (!) 102  Temp: 99.1 F (37.3 C)  TempSrc: Oral  SpO2: 99%  Height: 5\' 4"  (1.626 m)    General: Well developed, well nourished, in no acute distress  Skin : Warm and dry.  Head: Normocephalic and atraumatic  Lungs: Respirations unlabored; Neurologic: Alert and oriented; speech intact; face symmetrical; moves all extremities well; CNII-XII intact without focal deficit  Assessment:  1. Panic anxiety syndrome   2. Bipolar affective disorder, remission status unspecified (HCC)   3. Eating disorder, unspecified type   4. Gastroesophageal reflux disease without esophagitis   5. Other migraine without status migrainosus, not intractable     Plan:  I personally called the treatment center where she was recently treated and got discharge summary/ updated medication list; medications are refilled as requested; Patient will establish care with psychiatrist; may need to see headache specialist if her frequent headaches persist; she also plans to establish with a new GYN.   Spent 30 minutes with patient; greater than 50% spent in counseling;    No follow-ups on file.  No orders of the defined types were placed in this encounter.   Requested Prescriptions   Signed Prescriptions Disp Refills  . clonazePAM (KLONOPIN) 1 MG tablet 30 tablet 0    Sig: Take 1 tablet (1 mg total) by mouth daily as needed for anxiety.  . famotidine (PEPCID) 20 MG tablet 90 tablet 1    Sig: Take 1 tablet (20 mg total) by mouth at bedtime.  . naproxen (NAPROSYN) 250 MG tablet 60 tablet 0    Sig: Take 1 tablet (250 mg total) by mouth 2 (two) times daily with a meal.  . Norgestimate-Ethinyl Estradiol Triphasic (TRI-PREVIFEM) 0.18/0.215/0.25 MG-35 MCG tablet 1  Package 0    Sig: Take 1 tablet by mouth at bedtime.  [name] omeprazole (PRILOSEC) 20 MG capsule 90 capsule 1    Sig: Take 1 capsule (20 mg total) by mouth daily.  . rizatriptan (MAXALT) 10 MG tablet 10 tablet 1    Sig: Take 1 tablet (10 mg total) by mouth as needed for migraine.  . traZODone (DESYREL) 100 MG tablet 30 tablet 0    Sig: Take 1 tablet (100 mg total) by mouth at bedtime as needed for sleep.

## 2018-09-07 ENCOUNTER — Encounter: Payer: Self-pay | Admitting: Family

## 2018-09-08 ENCOUNTER — Ambulatory Visit: Payer: BLUE CROSS/BLUE SHIELD | Admitting: Registered"

## 2018-09-21 ENCOUNTER — Encounter: Payer: BLUE CROSS/BLUE SHIELD | Attending: Family | Admitting: Registered"

## 2018-09-21 ENCOUNTER — Encounter: Payer: Self-pay | Admitting: Registered"

## 2018-09-21 DIAGNOSIS — Z713 Dietary counseling and surveillance: Secondary | ICD-10-CM | POA: Diagnosis not present

## 2018-09-21 DIAGNOSIS — F502 Bulimia nervosa: Secondary | ICD-10-CM | POA: Diagnosis present

## 2018-09-21 DIAGNOSIS — F509 Eating disorder, unspecified: Secondary | ICD-10-CM

## 2018-09-21 NOTE — Progress Notes (Signed)
Appointment start time: 9:00  Appointment end time: 10:00  Patient was seen on 09/21/2018 for nutrition counseling pertaining to disordered eating  Primary care provider: Ria ClockLaura Murray, FNP Therapist: Rea CollegeAlyssa Triolo at [facility] Any other medical team members: none stated Parents: mom sat outside  Assessment  Eating history: Length of time: 3 years Previous treatments: 3 residential centers (once in [place] and twice at [facility]) Goals for RD meetings:   Weight history:  Highest weight: 320   Lowest weight: 123 Most consistent weight: N/A (pt does not weigh)   Medical Information:  Changes in hair, skin, nails since ED started: nails became brittle, drier skin Chewing/swallowing difficulties: none Reflux or heartburn: only when not taking medicine Trouble with teeth: chipping on the back on the 2 front teeth, has a few cavities. Plans to visit dentist.  LMP without the use of hormones: taking BC pills due to PCOS Constipation, diarrhea: none Dizziness/lightheadedness: yes, sometimes when going from sitting/lying to standing Headaches/body aches: yes, migraines and body aches Heart racing/chest pain: yes, when vaping says that she can't stop vaping Mood: up and down Sleep: takes medication to help with sleeping at night Focus/concentration: terrible, has been told that she has ADHD but has stopped taking medications. Pt states she is unsure why she was told to stop taking them.  Cold intolerance: yes Vision changes: yes  Mental health diagnosis: bulimia nervosa  Per discharge notes, recommendations from [facility]:   Recommend continuing blind weights  Recommend avoiding calorie discussion with pt  Pt needs additional assistance with hunger/satiety cues  Pt arrives stating she was in [place] for 2 weeks prior to today's visit. Pt states she was in depressive moved sometimes while in [place], mostly when she and her family were not doing anything. Pt states  before going to [place] she was at [facility] for a month. Per referral, pt was discharged AMA. Pt states she loved it there because everyone was personal and cared about her treatment. Pt states since she has left [address] [facility], some days are better than others. Pt states she was there before, but did not have outpatient therapy established, relapsed, and needed to re-admit to [facility].   Pt states she has a problem with restricting and binging/purging. Pt states it started when she was younger receiving comments from family started her ED. Pt states the comments would be "if you were smaller, you would look better". Pt states she does not weigh herself. Pt state she feels like she is getting better prepared for recovery because she is established outpatient care.   Pt states she has a memory problem and forgets things at times. Pt states what matters to her is the quantity of what she eats not what she eats. Pt states can tell when her body is satisfied with eating but can eat without stopping when she is happy or sad. Pt states she will eat sometimes before going to bed. Pt states she has insomnia; taking medications to help. Pt states she cannot sleep during the day regardless of how tired she is.   Pt states she sees therapist Rea Collegelyssa Triolo at [facility]. Upcoming appt is Fri, 11/15.    Dietary assessment: A typical day consists of 3 meals and  snacks  Safe foods include: none, eats whatever and whenever Avoided foods include:none  24 hour recall: traveling from [place] yesterday B: 5 chicken wings + handful of goldfish crackers + chocolate S: none L: ground beef + rice +  side salad + dessert S: none D: chicken + mashed potatoes + side salad + dessert S: hot dog + burger Beverages: coffee, juice, water, sprite, Pepsi   Estimated energy intake: ~2100 kcal  Estimated energy needs: 2100 kcal 263 g CHO 131 g pro 58 g fat  Nutrition Diagnosis: NB-1.5 Disordered  eating pattern As related to bulimia nervosa.  As evidenced by dietary recall.  Intervention/Goals: Pt was educated and counseled on the benefits of consistently fueling body throughout the day. Pt was congratulated on the progress made and encouraged to continue moving forward with changes and not weighing. Pt was in agreement with goals listed.  Goals: - Continue with exchanges set by [employer] # exchanges: 9 starch 7 protein 6 fat 2 dairy 2 fruit 2-3 vegetable   Meal plan:    3 meals    2-3 snacks To provide 2100 kcal      # exchanges: 9 starch 7 protein 6 fat 2 dairy 2 fruit 2-3 vegetable   Monitoring and Evaluation: Patient will follow up in 2 weeks.

## 2018-09-21 NOTE — Patient Instructions (Signed)
-   Continue with exchanges set by [facility] # exchanges: 9 starch 7 protein 6 fat 2 dairy 2 fruit 2-3 vegetable

## 2018-09-28 ENCOUNTER — Other Ambulatory Visit: Payer: Self-pay | Admitting: Family

## 2018-09-28 MED ORDER — NORGESTIM-ETH ESTRAD TRIPHASIC 0.18/0.215/0.25 MG-35 MCG PO TABS: 1.0000 | ORAL_TABLET | Freq: Every day | ORAL | 1 refills | Status: DC

## 2018-09-28 MED ORDER — TRAZODONE HCL 100 MG PO TABS: 100.0000 mg | ORAL_TABLET | Freq: Every evening | ORAL | 0 refills | Status: DC | PRN

## 2018-09-28 NOTE — Telephone Encounter (Signed)
Please let her know that I would like a copy of her updated medication list now that she has established with her psychiatrist. I will do short term refill on Trazodone for now; also, she needs to establish with new GYN as she told me she plans to do.

## 2018-09-28 NOTE — Telephone Encounter (Signed)
Message sent to patient today with info.

## 2018-10-05 ENCOUNTER — Ambulatory Visit: Payer: BLUE CROSS/BLUE SHIELD | Admitting: Registered"

## 2018-11-25 ENCOUNTER — Other Ambulatory Visit: Payer: Self-pay | Admitting: Internal Medicine

## 2018-11-25 DIAGNOSIS — F410 Panic disorder [episodic paroxysmal anxiety] without agoraphobia: Secondary | ICD-10-CM

## 2018-12-07 ENCOUNTER — Encounter: Payer: BLUE CROSS/BLUE SHIELD | Attending: Family | Admitting: Registered"

## 2018-12-07 DIAGNOSIS — F502 Bulimia nervosa: Secondary | ICD-10-CM

## 2018-12-07 DIAGNOSIS — Z713 Dietary counseling and surveillance: Secondary | ICD-10-CM | POA: Insufficient documentation

## 2018-12-07 NOTE — Patient Instructions (Addendum)
-   Aim to add another food group to dinner.   - Keep up the great work with breakfast and dinner. You are making great progress!

## 2018-12-07 NOTE — Progress Notes (Signed)
Appointment start time: 2:00  Appointment end time: 2:45  Patient was seen on 12/07/2018 for nutrition counseling pertaining to disordered eating  Primary care provider: Ria Clock, FNP Therapist: Higinio Roger at [facility] Any other medical team members: none stated Parents: mom sat outside  Assessment  Pt states her mood has been happy; medications are helping. Pt states she has been weighing once a week. Pt reports most recent is 165 lbs. Pt states she is still bingeing and purging; working on it. Pt states while she was in [place] recently didn't feel like eating and depression was challenging. Pt states she needs support and prefers stress-free environments. Pt states she always feels stuck, needs to know she is making progress.   Pt states she likes watching tv shows on netflix, go to best friend's house. Reports avoiding certain foods during the day but not at night.     Eating history: Length of time: 3 years Previous treatments: 3 residential centers (once in [place] and twice at [facility]) Goals for RD meetings:   Weight history:  Highest weight: 320   Lowest weight: 123 Most consistent weight: N/A (pt does not weigh)   Medical Information:  Changes in hair, skin, nails since ED started: drier skin Chewing/swallowing difficulties: no Reflux or heartburn: only when not taking medicine Trouble with teeth: no LMP without the use of hormones: taking BC pills due to PCOS Constipation, diarrhea: none Dizziness/lightheadedness: sometimes when going from sitting/lying to standing Headaches/body aches: sometimes Heart racing/chest pain: no Mood: happy Sleep: good, 6-7 hrs Focus/concentration: bad right now, but has just started new ADHD medications Cold intolerance: yes Vision changes: no  Mental health diagnosis: bulimia nervosa  Per discharge notes, recommendations from [facility]:   Recommend continuing blind weights  Recommend avoiding calorie  discussion with pt  Pt needs additional assistance with hunger/satiety cues  Dietary assessment: A typical day consists of 3 meals and  snacks  Safe foods include: pizza (only one slice), none, eats whatever and whenever Avoided foods include (during the day only, will eat at night): cake, cookies, chips, cheese, ice cream, milk  24 hour recall:  B: banana + juice  S: none L: ground beef + rice + side salad + dessert S: none D: 2 cheese quesdillas  Beverages: coffee, juice, water, sprite, Pepsi   Estimated energy intake: ~1100-1300 kcal  Estimated energy needs: 2100 kcal 263 g CHO 131 g pro 58 g fat  Nutrition Diagnosis: NB-1.5 Disordered eating pattern As related to bulimia nervosa.  As evidenced by dietary recall.  Intervention/Goals: Pt was educated and counseled on the benefits of consistently fueling body throughout the day. Encouraged progress made and being intentional with reducing purging, one meal at a time. Discussed adding more nutrition to dinner in efforts to nourish body. Pt was in agreement with goals listed.  Goals: - Aim to add another food group to dinner.  - Keep up the great work with breakfast and dinner. You are making great progress!   Previous [employer] # exchanges: 9 starch 7 protein 6 fat 2 dairy 2 fruit 2-3 vegetable   Meal plan:    3 meals    2-3 snacks To provide 2100 kcal      # exchanges: 9 starch 7 protein 6 fat 2 dairy 2 fruit 2-3 vegetable   Monitoring and Evaluation: Patient will follow up in 2 weeks.

## 2018-12-09 ENCOUNTER — Encounter: Payer: Self-pay | Admitting: Family

## 2018-12-12 ENCOUNTER — Other Ambulatory Visit: Payer: Self-pay | Admitting: Family

## 2018-12-15 ENCOUNTER — Encounter: Payer: BLUE CROSS/BLUE SHIELD | Attending: Family | Admitting: Registered"

## 2018-12-15 DIAGNOSIS — F502 Bulimia nervosa: Secondary | ICD-10-CM | POA: Diagnosis present

## 2018-12-15 DIAGNOSIS — Z713 Dietary counseling and surveillance: Secondary | ICD-10-CM | POA: Diagnosis not present

## 2018-12-15 NOTE — Progress Notes (Signed)
Appointment start time: 2:00  Appointment end time: 3:00  Patient was seen on 12/15/2018 for nutrition counseling pertaining to disordered eating  Primary care provider: Ria Clock, FNP Therapist: Higinio Roger at [facility] (bi-weekly) Any other medical team members: none stated Parents: none  Assessment  Pt arrives stating sleep is iffy and feels hungry during the night; taught nighttime is sleeping not for eating. Pt states she feels hungry during the night and wakes up often. Pt states she is still trying to sense satisfaction with breakfast, feels uncomfortable afterwards. Purges 3-4 times/day until dinner. Does not purge dinner because she goes to sleep directly afterwards. Pt states she typically eats alone. Has increased fluid intake to ~56 oz/day. Pt states she has been taking flaxseed to help with digestion. saw therapist last week; plans to see bi-weekly.   Next visit: discuss HLC options  Eating history: Length of time: 3 years Previous treatments: 3 residential centers (once in [place] and twice at [facility]) Goals for RD meetings:   Weight history:  Highest weight: 320   Lowest weight: 123 Most consistent weight: N/A (pt does not weigh)   Medical Information:  Changes in hair, skin, nails since ED started: drier skin Chewing/swallowing difficulties: no Reflux or heartburn: only when not taking medicine Trouble with teeth: no LMP without the use of hormones: taking BC pills due to PCOS Constipation, diarrhea: none Dizziness/lightheadedness: sometimes when going from sitting/lying to standing; has improved Headaches/body aches: sometimes Heart racing/chest pain: no Mood: happy Sleep: not sleeping through night, 3-4 hrs Focus/concentration: better, has just started new ADHD medications Cold intolerance: yes Vision changes: no  Mental health diagnosis: bulimia nervosa  Per discharge notes, recommendations from [facility]:   Recommend  continuing blind weights  Recommend avoiding calorie discussion with pt  Pt needs additional assistance with hunger/satiety cues  Dietary assessment: A typical day consists of 3 meals and 0 snacks  Safe foods include: pizza (only one slice), none, eats whatever and whenever Avoided foods include (during the day only, will eat at night): cake, cookies, chips, cheese, ice cream, milk  24 hour recall:  B (8-8:30am): banana + granola  S: none L (11am): cup of ramen noodles or ground beef + rice + side salad + dessert S: none D: [place] sandwich + cookie + water  Beverages: Gatorade, coffee, juice, water, sprite, Pepsi   Estimated energy intake: <800 kcal  Estimated energy needs: 2100 kcal 263 g CHO 131 g pro 58 g fat  Nutrition Diagnosis: NB-1.5 Disordered eating pattern As related to bulimia nervosa.  As evidenced by dietary recall.  Intervention/Goals: Pt was educated and counseled on the benefits of consistently fueling body throughout the day, explained cause of night time hunger, and discussed options to prevent purging. Will discuss higher level of care options at next appt with pt and eating disorder therapist options to help with coping with purging behaviors. Pt was in agreement with goals listed.  Goals: - Maybe trying some vegetables with dinner tonight - Keep working on trying to be consistent with breakfast. Try scrambled egg + toast.   Meal plan:    3 meals    2-3 snacks To provide 2100 kcal      # exchanges: 9 starch 7 protein 6 fat 2 dairy 2 fruit 2-3 vegetable   Monitoring and Evaluation: Patient will follow up in 2 weeks.

## 2018-12-15 NOTE — Patient Instructions (Addendum)
-   Maybe trying some vegetables with dinner tonight  - Keep working on trying to be consistent with breakfast. Try scrambled egg + toast.

## 2018-12-27 ENCOUNTER — Ambulatory Visit: Payer: BLUE CROSS/BLUE SHIELD | Admitting: Registered"

## 2019-01-02 ENCOUNTER — Ambulatory Visit: Payer: BLUE CROSS/BLUE SHIELD | Admitting: Registered"

## 2019-01-09 ENCOUNTER — Other Ambulatory Visit: Payer: Self-pay | Admitting: Family

## 2019-01-09 NOTE — Telephone Encounter (Signed)
Who is her GYN? They need to be managing her OCPs.

## 2019-01-19 ENCOUNTER — Other Ambulatory Visit: Payer: Self-pay | Admitting: Family

## 2019-01-24 ENCOUNTER — Other Ambulatory Visit: Payer: Self-pay | Admitting: Family

## 2019-05-17 ENCOUNTER — Other Ambulatory Visit: Payer: Self-pay | Admitting: Neurology

## 2019-06-09 DIAGNOSIS — F40243 Fear of flying: Secondary | ICD-10-CM | POA: Insufficient documentation

## 2020-04-06 IMAGING — US US TRANSVAGINAL NON-OB
1 series · 13 of 25 positions shown · non-contrast
Comparison: 02/16/2018 CT.  02/17/2011 ultrasound

CLINICAL DATA: 22-year-old female with acute LEFT pelvic pain.



[Series 1: us transvaginal non-ob · 13 of 92 slices shown]
[im 1/92]
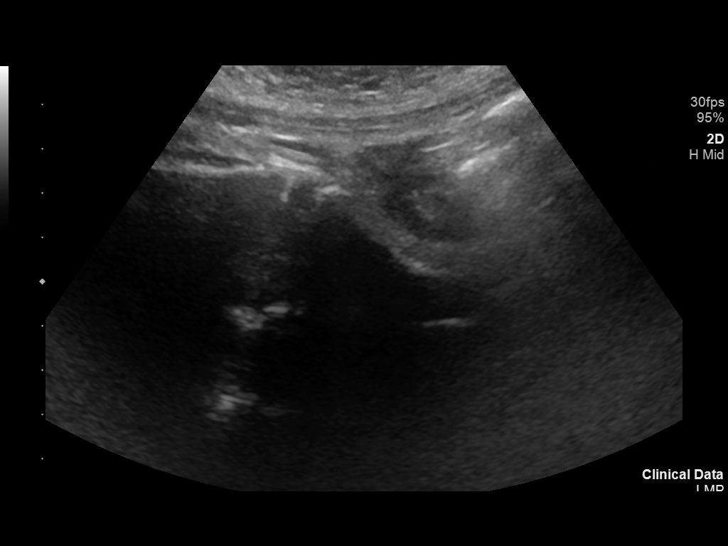
[im 8/92]
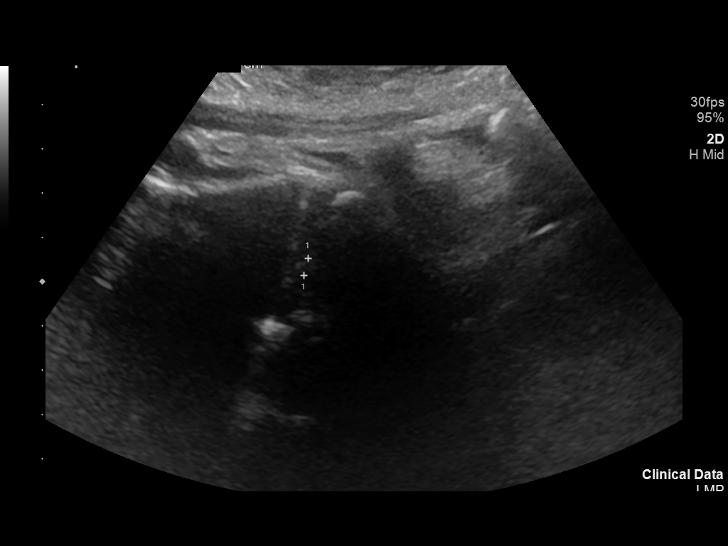
[im 16/92]
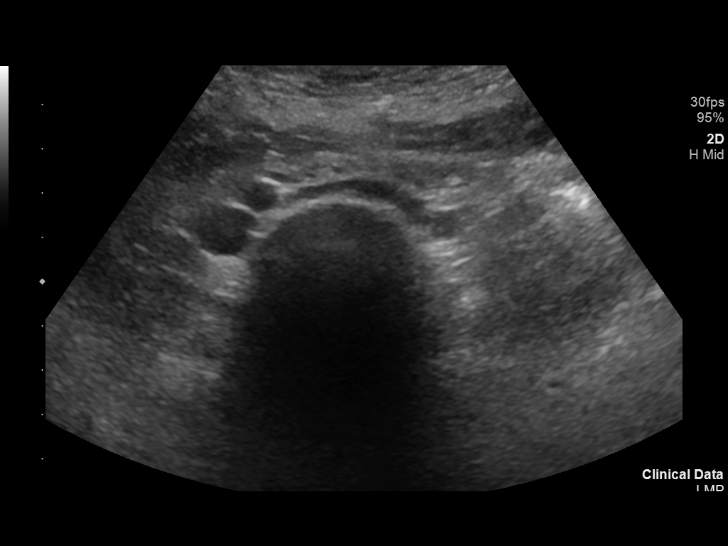
[im 23/92]
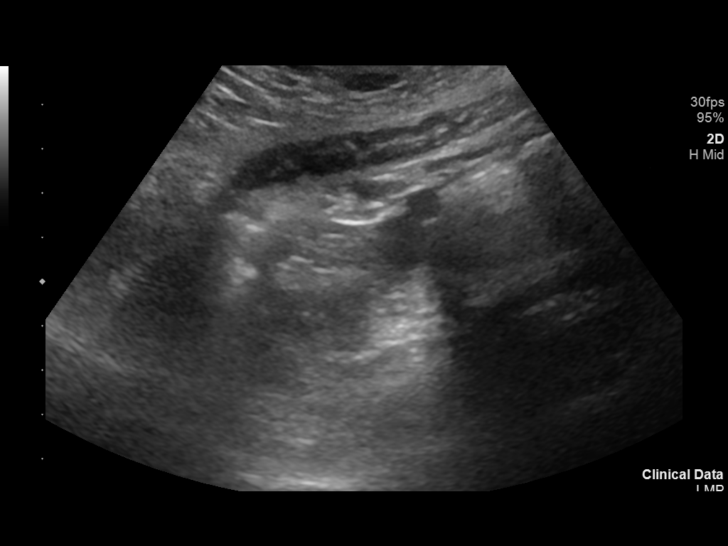
[im 31/92]
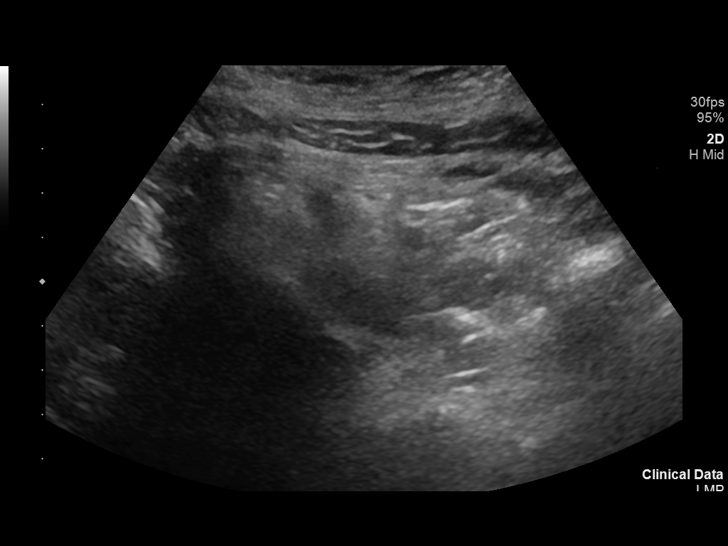
[im 38/92]
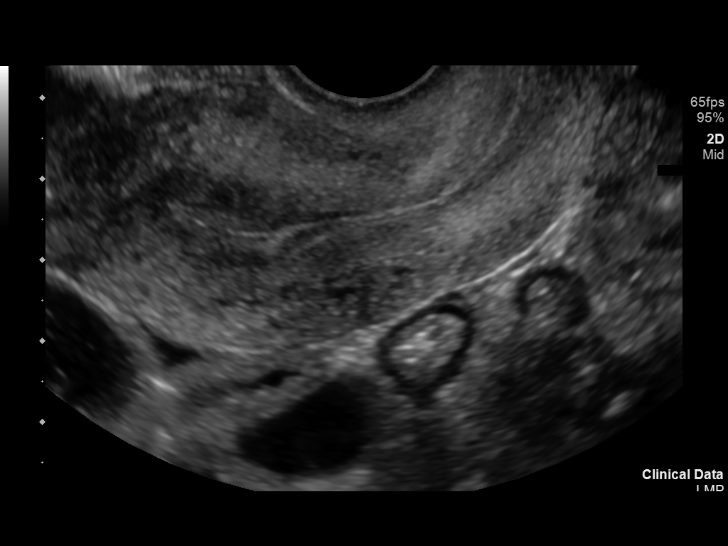
[im 46/92]
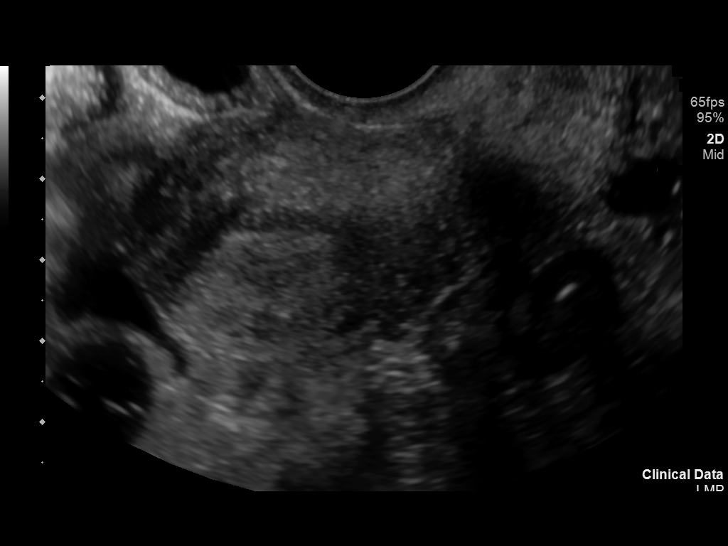
[im 54/92]
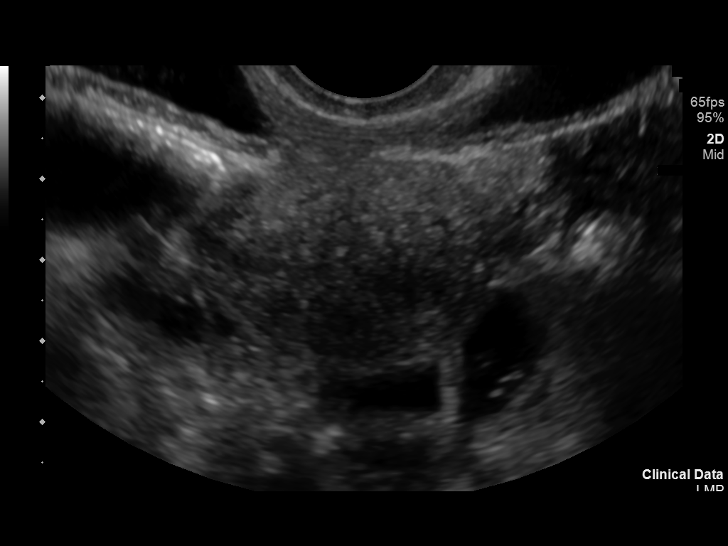
[im 61/92]
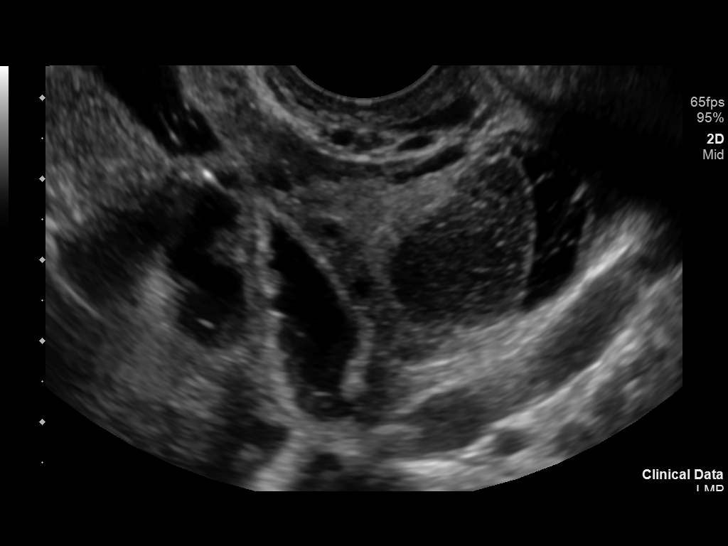
[im 69/92]
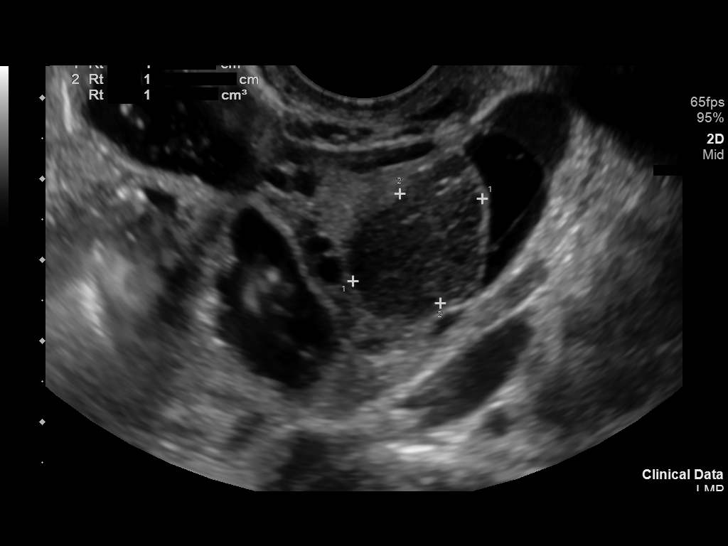
[im 76/92]
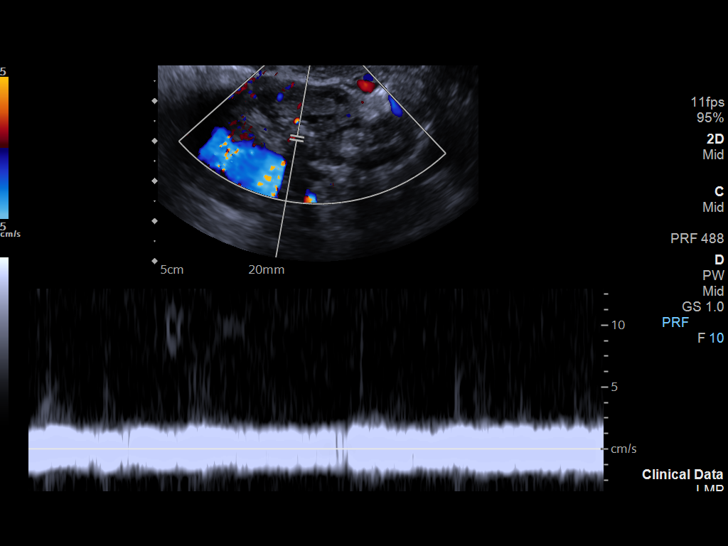
[im 84/92]
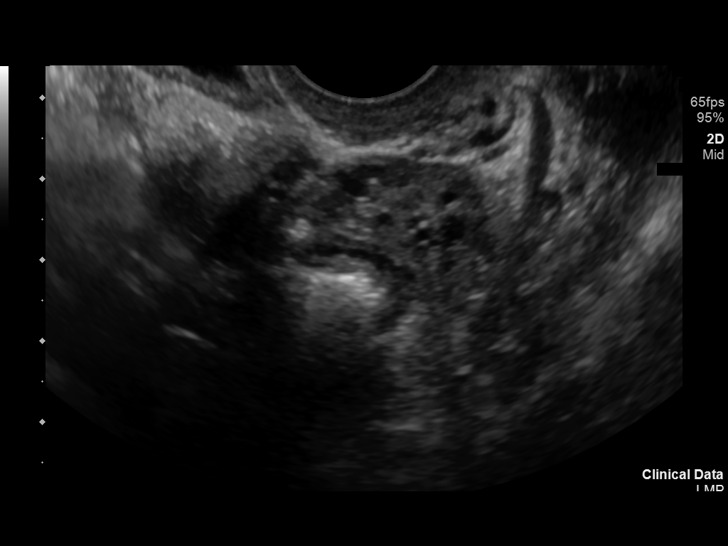
[im 92/92]
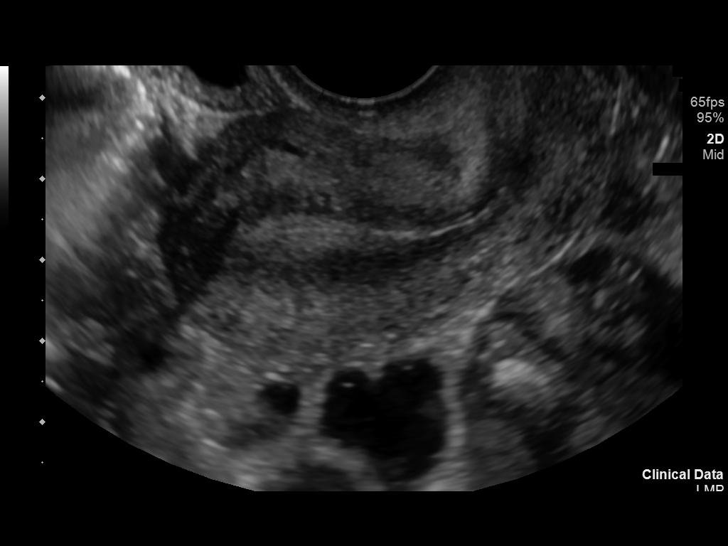

[13 of 25 positions shown; findings below may reference images not displayed]

FINDINGS: Uterus

Measurements: 6.6 x 3.2 x 3.8 cm.. No fibroids or other mass
visualized.

Endometrium

Thickness: 4 mm.  No focal abnormality visualized.

Right ovary

Measurements: 3.5 x 2.5 x 3.1 cm. A probable corpus luteum cyst
noted. Normal appearance/no adnexal mass.

Left ovary

Measurements: 2.6 x 1.7 x 1.6 cm. Normal appearance/no adnexal mass.

Pulsed Doppler evaluation of both ovaries demonstrates normal
low-resistance arterial and venous waveforms.

Other findings

No abnormal free fluid.
IMPRESSION: No acute or significant abnormalities. No evidence of ovarian
torsion, free fluid or adnexal mass.

## 2020-07-03 DIAGNOSIS — K5904 Chronic idiopathic constipation: Secondary | ICD-10-CM | POA: Insufficient documentation

## 2022-03-24 ENCOUNTER — Ambulatory Visit (INDEPENDENT_AMBULATORY_CARE_PROVIDER_SITE_OTHER): Payer: 59 | Admitting: Family

## 2022-03-24 ENCOUNTER — Ambulatory Visit: Payer: Self-pay | Admitting: Family

## 2022-03-24 ENCOUNTER — Encounter: Payer: Self-pay | Admitting: Family

## 2022-03-24 VITALS — BP 112/78 | HR 103 | Temp 98.6°F | Ht 64.0 in | Wt 207.4 lb

## 2022-03-24 DIAGNOSIS — E282 Polycystic ovarian syndrome: Secondary | ICD-10-CM | POA: Diagnosis not present

## 2022-03-24 MED ORDER — NORGESTIM-ETH ESTRAD TRIPHASIC 0.18/0.215/0.25 MG-35 MCG PO TABS: 1.0000 | ORAL_TABLET | Freq: Every day | ORAL | 2 refills | Status: DC

## 2022-03-24 NOTE — Progress Notes (Signed)
?[name] is a 26 y.o. female with the following history as recorded in EpicCare:  ?Patient Active Problem List  ? Diagnosis Date Noted  ? Panic anxiety syndrome 07/05/2018  ? Anorexia nervosa   ? Attention and concentration deficit 12/01/2017  ? Chronic idiopathic constipation 12/01/2017  ? Vomiting associated with bulimia nervosa with nausea 06/22/2017  ? Routine general medical examination at a health care facility 08/26/2016  ? PCOS (polycystic ovarian syndrome) 04/03/2016  ? Essential hypertension 02/20/2015  ? GE reflux   ?  ?Current Outpatient Medications  ?Medication Sig Dispense Refill  ? Collagen-Vitamin C-Biotin (COLLAGEN 1500/C PO) Take by mouth.    ? dicyclomine (BENTYL) 10 MG capsule Take 10 mg by mouth 3 (three) times daily.    ? Folic Acid (FOLATE PO) Take by mouth.    ? Multiple Vitamin (MULTIVITAMIN WITH MINERALS) TABS tablet Take 1 tablet by mouth daily.    ? Multiple Vitamin (MULTIVITAMIN) tablet Take 1 tablet by mouth daily.    ? Norgestimate-Ethinyl Estradiol Triphasic (TRI-ESTARYLLA) 0.18/0.215/0.25 MG-35 MCG tablet Take 1 tablet by mouth daily. 28 tablet 2  ? omeprazole (PRILOSEC) 20 MG capsule Take 1 capsule (20 mg total) by mouth daily. 90 capsule 1  ? ?No current facility-administered medications for this visit.  ?  ?Allergies: Guanfacine, Nortriptyline, Pineapple, and Other  ?Past Medical History:  ?Diagnosis Date  ? Anorexia nervosa   ? Dyspnea   ? breathing heavyeven when sitting, lying dpwn,exertion  ? GERD (gastroesophageal reflux disease)   ? Headache   ? History of blood transfusion   ? prematurity, this was during perinatal period  ? Hypertension   ? Morbid obesity (HCC) 04/03/2016  ? PCOS (polycystic ovarian syndrome) 04/03/2016  ? Reflux   ? Syncope   ?  ?Past Surgical History:  ?Procedure Laterality Date  ? CHOLECYSTECTOMY N/A 11/25/2016  ? Procedure: LAPAROSCOPIC CHOLECYSTECTOMY;  Surgeon: Axel Filler, MD;  [facility];  Service: General;  Laterality: N/A;  ?  ESOPHAGOGASTRODUODENOSCOPY N/A 09/26/2016  ? Procedure: ESOPHAGOGASTRODUODENOSCOPY (EGD);  Surgeon: Beverley Fiedler, MD;  Location: [facility];  Service: Endoscopy;  Laterality: N/A;  ? eustachion tubes    ?  ?Family History  ?Problem Relation Age of Onset  ? GER disease Mother   ? Hypertension Mother   ? GER disease Father   ? Hypertension Father   ? Lactose intolerance Brother   ? Hypertension Maternal Grandfather   ? Diabetes Maternal Grandfather   ? Stroke Maternal Grandfather   ? Diabetes Paternal Uncle   ? Cancer Neg Hx   ? Depression Neg Hx   ? Drug abuse Neg Hx   ? Early death Neg Hx   ? Heart disease Neg Hx   ? Hyperlipidemia Neg Hx   ? Kidney disease Neg Hx   ? Alcohol abuse Neg Hx   ? Asthma Neg Hx   ?  ?Social History  ? ?Tobacco Use  ? Smoking status: Never  ? Smokeless tobacco: Never  ?Substance Use Topics  ? Alcohol use: No  ?  ?Subjective:  ? ?Patient was last seen by me in 2019; she has been under the care of at least 2 other PCPs in that time due to insurance changes;  ?Back to re-establish with me today; thinks she is up to date on pap smear/ GYN but does not have any records available for review; her most recent PCP is not on Epic;  ? ?Requesting refill on OCPs- notes she has history of PCOS; ? ?  LMP- early May/ denies any chance of being pregnant ? ? ? ? ?Objective:  ?Vitals:  ? 03/24/22 1012  ?BP: 112/78  ?Pulse: (!) 103  ?Temp: 98.6 ?F (37 ?C)  ?TempSrc: Oral  ?SpO2: 99%  ?Weight: 207 lb 6.4 oz (94.1 kg)  ?Height: 5\' 4"  (1.626 m)  ?  ?General: Well developed, well nourished, in no acute distress  ?Skin : Warm and dry.  ?Head: Normocephalic and atraumatic  ?Eyes: Sclera and conjunctiva clear; pupils round and reactive to light; extraocular movements intact  ?Ears: External normal; canals clear; tympanic membranes normal  ?Oropharynx: Pink, supple. No suspicious lesions  ?Neck: Supple without thyromegaly, adenopathy  ?Lungs: Respirations unlabored; clear to auscultation bilaterally without wheeze,  rales, rhonchi  ?CVS exam: normal rate and regular rhythm.  ?Neurologic: Alert and oriented; speech intact; face symmetrical; moves all extremities well; CNII-XII intact without focal deficit  ? ?Assessment:  ?1. PCOS (polycystic ovarian syndrome)   ?  ?Plan:  ?Refill updated for OCP until patient can get re-established with new GYN; referral updated; ? ?She is to get copies of records from recent provider to determine what type of follow up she needs at this time. ?No specific follow up scheduled;  ? ?No follow-ups on file.  ?Orders Placed This Encounter  ?Procedures  ? Ambulatory referral to Obstetrics / Gynecology  ?  Referral Priority:   Routine  ?  Referral Type:   Consultation  ?  Referral Reason:   Specialty Services Required  ?  Requested Specialty:   Obstetrics and Gynecology  ?  Number of Visits Requested:   1  ?  ?Requested Prescriptions  ? ?Signed Prescriptions Disp Refills  ? Norgestimate-Ethinyl Estradiol Triphasic (TRI-ESTARYLLA) 0.18/0.215/0.25 MG-35 MCG tablet 28 tablet 2  ?  Sig: Take 1 tablet by mouth daily.  ?  ? ?

## 2022-04-24 ENCOUNTER — Ambulatory Visit: Payer: 59 | Admitting: Family

## 2022-05-08 ENCOUNTER — Ambulatory Visit (INDEPENDENT_AMBULATORY_CARE_PROVIDER_SITE_OTHER): Payer: 59 | Admitting: Family

## 2022-05-08 VITALS — BP 115/72 | HR 85 | Temp 98.2°F | Resp 16 | Ht 64.0 in | Wt 220.0 lb

## 2022-05-08 DIAGNOSIS — K219 Gastro-esophageal reflux disease without esophagitis: Secondary | ICD-10-CM | POA: Diagnosis not present

## 2022-05-08 DIAGNOSIS — Z0000 Encounter for general adult medical examination without abnormal findings: Secondary | ICD-10-CM | POA: Diagnosis not present

## 2022-05-08 DIAGNOSIS — Z13220 Encounter for screening for lipoid disorders: Secondary | ICD-10-CM | POA: Diagnosis not present

## 2022-05-08 MED ORDER — OMEPRAZOLE 20 MG PO CPDR
20.0000 mg | DELAYED_RELEASE_CAPSULE | Freq: Every day | ORAL | 3 refills | Status: DC
Start: 2022-05-08 — End: 2022-06-30

## 2022-05-08 MED ORDER — DICYCLOMINE HCL 10 MG PO CAPS: 10.0000 mg | ORAL_CAPSULE | Freq: Three times a day (TID) | ORAL | 3 refills | Status: DC

## 2022-05-08 NOTE — Progress Notes (Signed)
Stacey Holland is a 26 y.o. female with the following history as recorded in EpicCare:  Patient Active Problem List   Diagnosis Date Noted   Panic anxiety syndrome 07/05/2018   Anorexia nervosa    Attention and concentration deficit 12/01/2017   Chronic idiopathic constipation 12/01/2017   Vomiting associated with bulimia nervosa with nausea 06/22/2017   Routine general medical examination at a health care facility 08/26/2016   PCOS (polycystic ovarian syndrome) 04/03/2016   Essential hypertension 02/20/2015   GE reflux     Current Outpatient Medications  Medication Sig Dispense Refill   dicyclomine (BENTYL) 10 MG capsule Take 1 capsule (10 mg total) by mouth 3 (three) times daily. 203 capsule 3   Folic Acid (FOLATE PO) Take by mouth.     Multiple Vitamin (MULTIVITAMIN WITH MINERALS) TABS tablet Take 1 tablet by mouth daily.     Norgestimate-Ethinyl Estradiol Triphasic (TRI-ESTARYLLA) 0.18/0.215/0.25 MG-35 MCG tablet Take 1 tablet by mouth daily. 28 tablet 2   omeprazole (PRILOSEC) 20 MG capsule Take 1 capsule (20 mg total) by mouth daily. 90 capsule 3   No current facility-administered medications for this visit.    Allergies: Guanfacine, Nortriptyline, Pineapple, and Other  Past Medical History:  Diagnosis Date   Anorexia nervosa    Dyspnea    breathing heavyeven when sitting, lying dpwn,exertion   GERD (gastroesophageal reflux disease)    Headache    History of blood transfusion    prematurity, this was during perinatal period   Hypertension    Morbid obesity ([place]) 04/03/2016   PCOS (polycystic ovarian syndrome) 04/03/2016   Reflux    Syncope     Past Surgical History:  Procedure Laterality Date   CHOLECYSTECTOMY N/A 11/25/2016   Procedure: LAPAROSCOPIC CHOLECYSTECTOMY;  Surgeon: Ralene Ok, MD;  Location: [facility];  Service: General;  Laterality: N/A;   ESOPHAGOGASTRODUODENOSCOPY N/A 09/26/2016   Procedure: ESOPHAGOGASTRODUODENOSCOPY (EGD);  Surgeon: Jerene Bears,  MD;  Location: [facility];  Service: Endoscopy;  Laterality: N/A;   eustachion tubes      Family History  Problem Relation Age of Onset   GER disease Mother    Hypertension Mother    GER disease Father    Hypertension Father    Lactose intolerance Brother    Hypertension Maternal Grandfather    Diabetes Maternal Grandfather    Stroke Maternal Grandfather    Diabetes Paternal Uncle    Cancer Neg Hx    Depression Neg Hx    Drug abuse Neg Hx    Early death Neg Hx    Heart disease Neg Hx    Hyperlipidemia Neg Hx    Kidney disease Neg Hx    Alcohol abuse Neg Hx    Asthma Neg Hx     Social History   Tobacco Use   Smoking status: Never   Smokeless tobacco: Never  Substance Use Topics   Alcohol use: No    Subjective:  Presents for yearly CPE; no acute concerns today; is scheduled to see GYN later this summer;  Would like to get labs updated today;   Review of Systems  Constitutional: Negative.   HENT: Negative.    Eyes: Negative.   Respiratory: Negative.    Cardiovascular: Negative.   Gastrointestinal: Negative.   Genitourinary: Negative.   Musculoskeletal: Negative.   Skin: Negative.   Neurological: Negative.   Endo/Heme/Allergies: Negative.   Psychiatric/Behavioral: Negative.          Objective:  Vitals:   05/08/22 1430  BP:  115/72  Pulse: 85  Resp: 16  Temp: 98.2 F (36.8 C)  TempSrc: Oral  SpO2: 99%  Weight: 220 lb (99.8 kg)  Height: $Remove'5\' 4"'dMyIPop$  (1.626 m)    General: Well developed, well nourished, in no acute distress  Skin : Warm and dry.  Head: Normocephalic and atraumatic  Eyes: Sclera and conjunctiva clear; pupils round and reactive to light; extraocular movements intact  Ears: External normal; canals clear; tympanic membranes normal  Oropharynx: Pink, supple. No suspicious lesions  Neck: Supple without thyromegaly, adenopathy  Lungs: Respirations unlabored; clear to auscultation bilaterally without wheeze, rales, rhonchi  CVS exam: normal rate  and regular rhythm.  Abdomen: Soft; nontender; nondistended; normoactive bowel sounds; no masses or hepatosplenomegaly  Musculoskeletal: No deformities; no active joint inflammation  Extremities: No edema, cyanosis, clubbing  Vessels: Symmetric bilaterally  Neurologic: Alert and oriented; speech intact; face symmetrical; moves all extremities well; CNII-XII intact without focal deficit   Assessment:  1. PE (physical exam), annual   2. Gastroesophageal reflux disease without esophagitis   3. Lipid screening     Plan:  Age appropriate preventive healthcare needs addressed; encouraged regular eye doctor and dental exams; encouraged regular exercise; will update labs and refills as needed today; follow-up to be determined;   No follow-ups on file.  Orders Placed This Encounter  Procedures   CBC with Differential/Platelet   Comp Met (CMET)   Lipid panel   TSH    Requested Prescriptions   Signed Prescriptions Disp Refills   dicyclomine (BENTYL) 10 MG capsule 270 capsule 3    Sig: Take 1 capsule (10 mg total) by mouth 3 (three) times daily.   omeprazole (PRILOSEC) 20 MG capsule 90 capsule 3    Sig: Take 1 capsule (20 mg total) by mouth daily.

## 2022-05-09 LAB — CBC WITH DIFFERENTIAL/PLATELET
Absolute Monocytes: 445 cells/uL (ref 200–950)
Basophils Absolute: 37 cells/uL (ref 0–200)
Basophils Relative: 0.7 %
Eosinophils Absolute: 80 cells/uL (ref 15–500)
Eosinophils Relative: 1.5 %
HCT: 39.9 % (ref 35.0–45.0)
Hemoglobin: 13.3 g/dL (ref 11.7–15.5)
Lymphs Abs: 2390 cells/uL (ref 850–3900)
MCH: 26.4 pg — ABNORMAL LOW (ref 27.0–33.0)
MCHC: 33.3 g/dL (ref 32.0–36.0)
MCV: 79.3 fL — ABNORMAL LOW (ref 80.0–100.0)
MPV: 10.7 fL (ref 7.5–12.5)
Monocytes Relative: 8.4 %
Neutro Abs: 2348 cells/uL (ref 1500–7800)
Neutrophils Relative %: 44.3 %
Platelets: 305 10*3/uL (ref 140–400)
RBC: 5.03 10*6/uL (ref 3.80–5.10)
RDW: 12.2 % (ref 11.0–15.0)
Total Lymphocyte: 45.1 %
WBC: 5.3 10*3/uL (ref 3.8–10.8)

## 2022-05-09 LAB — COMPREHENSIVE METABOLIC PANEL
AG Ratio: 1.1 (calc) (ref 1.0–2.5)
ALT: 18 U/L (ref 6–29)
AST: 19 U/L (ref 10–30)
Albumin: 3.7 g/dL (ref 3.6–5.1)
Alkaline phosphatase (APISO): 61 U/L (ref 31–125)
BUN: 10 mg/dL (ref 7–25)
CO2: 24 mmol/L (ref 20–32)
Calcium: 9 mg/dL (ref 8.6–10.2)
Chloride: 101 mmol/L (ref 98–110)
Creat: 0.77 mg/dL (ref 0.50–0.96)
Globulin: 3.5 g/dL (calc) (ref 1.9–3.7)
Glucose, Bld: 75 mg/dL (ref 65–99)
Potassium: 4.3 mmol/L (ref 3.5–5.3)
Sodium: 136 mmol/L (ref 135–146)
Total Bilirubin: 0.4 mg/dL (ref 0.2–1.2)
Total Protein: 7.2 g/dL (ref 6.1–8.1)

## 2022-05-09 LAB — LIPID PANEL
Cholesterol: 242 mg/dL — ABNORMAL HIGH
HDL: 82 mg/dL
LDL Cholesterol (Calc): 144 mg/dL — ABNORMAL HIGH
Non-HDL Cholesterol (Calc): 160 mg/dL — ABNORMAL HIGH
Total CHOL/HDL Ratio: 3 (calc)
Triglycerides: 65 mg/dL

## 2022-05-09 LAB — TSH: TSH: 1.15 mIU/L

## 2022-05-14 ENCOUNTER — Other Ambulatory Visit: Payer: Self-pay | Admitting: Family

## 2022-05-14 ENCOUNTER — Encounter: Payer: Self-pay | Admitting: Family

## 2022-05-14 MED ORDER — ALPRAZOLAM 0.5 MG PO TABS: ORAL_TABLET | ORAL | 0 refills | Status: DC

## 2022-05-15 ENCOUNTER — Encounter: Payer: 59 | Admitting: Family

## 2022-06-16 ENCOUNTER — Other Ambulatory Visit: Payer: Self-pay | Admitting: Family

## 2022-06-30 ENCOUNTER — Other Ambulatory Visit: Payer: Self-pay | Admitting: Family

## 2022-06-30 ENCOUNTER — Encounter (HOSPITAL_BASED_OUTPATIENT_CLINIC_OR_DEPARTMENT_OTHER): Payer: 59 | Admitting: Advanced Practice Midwife

## 2022-06-30 DIAGNOSIS — K219 Gastro-esophageal reflux disease without esophagitis: Secondary | ICD-10-CM

## 2022-06-30 MED ORDER — OMEPRAZOLE 20 MG PO CPDR: 20.0000 mg | DELAYED_RELEASE_CAPSULE | Freq: Every day | ORAL | 3 refills | Status: DC

## 2022-06-30 MED ORDER — NORGESTIM-ETH ESTRAD TRIPHASIC 0.18/0.215/0.25 MG-35 MCG PO TABS: 1.0000 | ORAL_TABLET | Freq: Every day | ORAL | 0 refills | Status: DC

## 2022-09-17 ENCOUNTER — Encounter: Payer: Self-pay | Admitting: Family

## 2022-09-18 ENCOUNTER — Other Ambulatory Visit: Payer: Self-pay | Admitting: Family

## 2022-09-18 MED ORDER — ALPRAZOLAM 0.5 MG PO TABS: ORAL_TABLET | ORAL | 0 refills | Status: DC

## 2022-09-21 ENCOUNTER — Other Ambulatory Visit: Payer: Self-pay | Admitting: Family

## 2022-09-22 ENCOUNTER — Encounter (HOSPITAL_BASED_OUTPATIENT_CLINIC_OR_DEPARTMENT_OTHER): Payer: Self-pay | Admitting: Medical

## 2022-09-22 ENCOUNTER — Ambulatory Visit (INDEPENDENT_AMBULATORY_CARE_PROVIDER_SITE_OTHER): Payer: Commercial Managed Care - HMO | Admitting: Medical

## 2022-09-22 ENCOUNTER — Other Ambulatory Visit (HOSPITAL_COMMUNITY)
Admission: RE | Admit: 2022-09-22 | Discharge: 2022-09-22 | Disposition: A | Discharge status: Home / Self Care | Payer: Commercial Managed Care - HMO | Source: Ambulatory Visit | Attending: Advanced Practice Midwife | Admitting: Advanced Practice Midwife

## 2022-09-22 VITALS — BP 127/77 | HR 65 | Ht 64.0 in | Wt 194.4 lb

## 2022-09-22 DIAGNOSIS — N898 Other specified noninflammatory disorders of vagina: Secondary | ICD-10-CM | POA: Diagnosis present

## 2022-09-22 DIAGNOSIS — E282 Polycystic ovarian syndrome: Secondary | ICD-10-CM | POA: Diagnosis not present

## 2022-09-22 DIAGNOSIS — B3731 Acute candidiasis of vulva and vagina: Secondary | ICD-10-CM

## 2022-09-22 MED ORDER — TRIAMCINOLONE ACETONIDE 0.5 % EX OINT: 1.0000 | TOPICAL_OINTMENT | Freq: Two times a day (BID) | CUTANEOUS | 0 refills | Status: DC

## 2022-09-22 MED ORDER — NORGESTIMATE-ETH ESTRADIOL 0.25-35 MG-MCG PO TABS: 1.0000 | ORAL_TABLET | Freq: Every day | ORAL | 11 refills | Status: DC

## 2022-09-22 MED ORDER — NYSTATIN 100000 UNIT/GM EX CREA: TOPICAL_CREAM | CUTANEOUS | 0 refills | Status: DC

## 2022-09-22 NOTE — Progress Notes (Signed)
   History:  [name] is a 26 y.o. who presents to clinic today for new patient appointment. Her PCP requested that she see a GYN since she had been on OCPs for PCOS since she was a teenager. She has regular periods on her OCPs. She did not spotting most days last month. She denies abnormal discharge, but has had significant vaginal itching recently. On her OCPs, she has regular periods lasting 5-6 days with moderate flow and very mild cramping. She reports a normal pap smear no more than 2 years ago that was normal. She is not currently sexually active.   The following portions of the patient's history were reviewed and updated as appropriate: allergies, current medications, family history, past medical history, social history, past surgical history and problem list.  Review of Systems:  Review of Systems  Constitutional:  Negative for fever and malaise/fatigue.  Gastrointestinal:  Negative for abdominal pain.  Genitourinary:  Negative for dysuria, frequency and urgency.       Neg - vaginal bleeding, discharge, pelvic pain + vaginal itching      Objective:  Physical Exam BP 127/77 (BP Location: Right Arm, Patient Position: Sitting, Cuff Size: Large)   Pulse 65   Ht 5\' 4"  (1.626 m) Comment: reported  Wt 194 lb 6.4 oz (88.2 kg)   BMI 33.37 kg/m  Physical Exam Exam conducted with a chaperone present.  Constitutional:      General: She is not in acute distress.    Appearance: Normal appearance. She is obese. She is not ill-appearing.  HENT:     Head: Normocephalic.  Cardiovascular:     Rate and Rhythm: Normal rate.  Pulmonary:     Effort: Pulmonary effort is normal.  Abdominal:     General: Abdomen is flat. There is no distension.     Palpations: Abdomen is soft.  Genitourinary:    Pubic Area: Rash (light plaques noted on the labia majora bilaterally) present.  Skin:    General: Skin is warm and dry.     Coloration: Skin is not pale.  Neurological:     Mental  Status: She is alert and oriented to person, place, and time.  Psychiatric:        Mood and Affect: Mood normal.    Health Maintenance Due  Topic Date Due   HPV VACCINES (1 - 2-dose series) Never done   PAP-Cervical Cytology Screening  04/29/2020   PAP SMEAR-Modifier  04/29/2020   INFLUENZA VACCINE  06/09/2022    Labs, imaging and previous visits in Epic and Care Everywhere reviewed  Assessment & Plan:  1. Vaginal itching - Cervicovaginal ancillary only( [facility])  2. PCOS (polycystic ovarian syndrome) - norgestimate-ethinyl estradiol (ORTHO-CYCLEN) 0.25-35 MG-MCG tablet; Take 1 tablet by mouth daily.  Dispense: 28 tablet; Refill: 11  3. Yeast vaginitis - nystatin cream (MYCOSTATIN); Apply to affected area 2 times daily  Dispense: 15 g; Refill: 0 - triamcinolone ointment (KENALOG) 0.5 %; Apply 1 Application topically 2 (two) times daily.  Dispense: 30 g; Refill: 0 - Patient advised to use for no more than 14 days and report if there is no symptoms improvement - Patient states she swims a lot and this is likely the source of yeast infection, per patient recent DM screen normal with PCP    07-26-1999, PA-C 09/22/2022 3:38 PM

## 2022-09-23 LAB — CERVICOVAGINAL ANCILLARY ONLY
Bacterial Vaginitis (gardnerella): NEGATIVE
Candida Glabrata: NEGATIVE
Candida Vaginitis: POSITIVE — AB
Comment: NEGATIVE
Comment: NEGATIVE
Comment: NEGATIVE

## 2022-09-24 MED ORDER — FLUCONAZOLE 150 MG PO TABS: 150.0000 mg | ORAL_TABLET | Freq: Once | ORAL | 0 refills | Status: AC

## 2022-09-24 NOTE — Addendum Note (Signed)
Addended by: Marny Lowenstein on: 09/24/2022 10:43 AM   Modules accepted: Orders

## 2022-11-24 ENCOUNTER — Ambulatory Visit: Payer: Commercial Managed Care - HMO | Admitting: Family

## 2023-01-26 ENCOUNTER — Encounter: Payer: Self-pay | Admitting: Family

## 2023-01-26 ENCOUNTER — Inpatient Hospital Stay (HOSPITAL_COMMUNITY)
Admission: AD | Admit: 2023-01-26 | Discharge: 2023-01-26 | Disposition: A | Discharge status: Home / Self Care | Payer: 59 | Attending: Family Medicine | Admitting: Family Medicine

## 2023-01-26 ENCOUNTER — Other Ambulatory Visit: Payer: Self-pay | Admitting: Family

## 2023-01-26 MED ORDER — ALPRAZOLAM 0.5 MG PO TABS: ORAL_TABLET | ORAL | 0 refills | Status: DC

## 2023-01-26 NOTE — Telephone Encounter (Signed)
Requesting: alprazolam 0.5mg  Contract: None UDS: None Last Visit: 05/08/22 Next Visit: None Last Refill: 09/18/22 #10 and 0RF   Please Advise

## 2023-02-10 DIAGNOSIS — Z711 Person with feared health complaint in whom no diagnosis is made: Secondary | ICD-10-CM | POA: Diagnosis not present

## 2023-02-10 DIAGNOSIS — Z3201 Encounter for pregnancy test, result positive: Secondary | ICD-10-CM | POA: Diagnosis not present

## 2023-04-08 DIAGNOSIS — Z3480 Encounter for supervision of other normal pregnancy, unspecified trimester: Secondary | ICD-10-CM | POA: Diagnosis not present

## 2023-04-19 ENCOUNTER — Other Ambulatory Visit: Payer: Self-pay | Admitting: Medical

## 2023-04-19 ENCOUNTER — Ambulatory Visit
Admission: RE | Admit: 2023-04-19 | Discharge: 2023-04-19 | Disposition: A | Discharge status: Home / Self Care | Payer: 59 | Source: Ambulatory Visit | Attending: Internal Medicine | Admitting: Internal Medicine

## 2023-04-19 ENCOUNTER — Telehealth: Payer: 59 | Admitting: Family Medicine

## 2023-04-19 VITALS — BP 115/80 | HR 73 | Temp 98.6°F | Resp 18

## 2023-04-19 DIAGNOSIS — R112 Nausea with vomiting, unspecified: Secondary | ICD-10-CM | POA: Diagnosis not present

## 2023-04-19 DIAGNOSIS — B3731 Acute candidiasis of vulva and vagina: Secondary | ICD-10-CM

## 2023-04-19 DIAGNOSIS — B349 Viral infection, unspecified: Secondary | ICD-10-CM | POA: Diagnosis not present

## 2023-04-19 DIAGNOSIS — R197 Diarrhea, unspecified: Secondary | ICD-10-CM | POA: Diagnosis not present

## 2023-04-19 MED ORDER — TRIAMCINOLONE ACETONIDE 0.5 % EX OINT: 1.0000 | TOPICAL_OINTMENT | Freq: Two times a day (BID) | CUTANEOUS | 0 refills | Status: DC

## 2023-04-19 MED ORDER — SODIUM CHLORIDE 0.9 % IV BOLUS
1000.0000 mL | Freq: Once | INTRAVENOUS | Status: AC
  Administered 2023-04-19: 1000 mL via INTRAVENOUS

## 2023-04-19 MED ORDER — ONDANSETRON 4 MG PO TBDP
4.0000 mg | ORAL_TABLET | Freq: Once | ORAL | Status: AC
  Administered 2023-04-19: 4 mg via ORAL

## 2023-04-19 MED ORDER — ONDANSETRON HCL 4 MG PO TABS: 4.0000 mg | ORAL_TABLET | Freq: Three times a day (TID) | ORAL | 0 refills | Status: DC | PRN

## 2023-04-19 NOTE — Progress Notes (Signed)
E-Visit for Nausea and Vomiting   We are sorry that you are not feeling well. Here is how we plan to help!  Based on what you have shared with me it looks like you have a Virus that is irritating your GI tract.  Vomiting is the forceful emptying of a portion of the stomach's content through the mouth.  Although nausea and vomiting can make you feel miserable, it's important to remember that these are not diseases, but rather symptoms of an underlying illness.  When we treat short term symptoms, we always caution that any symptoms that persist should be fully evaluated in a medical office.  I have prescribed a medication that will help alleviate your symptoms and allow you to stay hydrated:  Zofran 4 mg 1 tablet every 8 hours as needed for nausea and vomiting  HOME CARE: Drink clear liquids.  This is very important! Dehydration (the lack of fluid) can lead to a serious complication.  Start off with 1 tablespoon every 5 minutes for 8 hours. You may begin eating bland foods after 8 hours without vomiting.  Start with saltine crackers, white bread, rice, mashed potatoes, applesauce. After 48 hours on a bland diet, you may resume a normal diet. Try to go to sleep.  Sleep often empties the stomach and relieves the need to vomit.  GET HELP RIGHT AWAY IF:  Your symptoms do not improve or worsen within 2 days after treatment. You have a fever for over 3 days. You cannot keep down fluids after trying the medication.  MAKE SURE YOU:  Understand these instructions. Will watch your condition. Will get help right away if you are not doing well or get worse.    Thank you for choosing an e-visit.  Your e-visit answers were reviewed by a board certified advanced clinical practitioner to complete your personal care plan. Depending upon the condition, your plan could have included both over the counter or prescription medications.  Please review your pharmacy choice. Make sure the pharmacy is open so  you can pick up prescription now. If there is a problem, you may contact your provider through MyChart messaging and have the prescription routed to another pharmacy.  Your safety is important to us. If you have drug allergies check your prescription carefully.   For the next 24 hours you can use MyChart to ask questions about today's visit, request a non-urgent call back, or ask for a work or school excuse. You will get an email in the next two days asking about your experience. I hope that your e-visit has been valuable and will speed your recovery.  I provided 5 minutes of non face-to-face time during this encounter for chart review, medication and order placement, as well as and documentation.   

## 2023-04-19 NOTE — ED Triage Notes (Signed)
Patient presents to UC for n/v and diarrhea since Saturday. States she believes she has food poisoning. States has tried pepto, bland foods, and increasing fluids with no improvement.

## 2023-04-19 NOTE — Discharge Instructions (Signed)
Please take ondansetron nausea medication as needed that was prescribed by other healthcare provider.  Ensure adequate fluid hydration.  Go to the ER if any symptoms persist or worsen.

## 2023-04-19 NOTE — ED Provider Notes (Signed)
[name]    CSN: [id number] Arrival date & time: 04/19/23  1139      History   Chief Complaint Chief Complaint  Patient presents with   Nausea    I've been non stop throwing up for 3 days, can't keep anything down. I'm extremely uncomfortable and unable to stop the nausea despite using a couple methods. I can't take it anymore. - Entered by patient    HPI Stacey Holland is a 27 y.o. female.   Patient presents with nausea, vomiting and diarrhea that started 3 days ago.  She reports that she has not been able to keep any food or fluids down since symptoms started.  Denies blood in emesis or stool.  Patient reports that she had similar symptoms in the past which caused kidney failure given that she was not able to keep any food or fluids down.  She had video visit today and was prescribed ondansetron but reports that she has not yet taken this medication.  Denies fever but reports body aches and chills.  Denies any known sick contacts or recent travel outside [place].  Patient reports that she is concerned for food poisoning given that she ate [employer] prior to symptoms starting.  Patient does take omeprazole and Bentyl as well.     Past Medical History:  Diagnosis Date   Anorexia nervosa    Dyspnea    breathing heavyeven when sitting, lying dpwn,exertion   GERD (gastroesophageal reflux disease)    Headache    History of blood transfusion    prematurity, this was during perinatal period   Hypertension    Morbid obesity (HCC) 04/03/2016   PCOS (polycystic ovarian syndrome) 04/03/2016   Reflux    Syncope     Patient Active Problem List   Diagnosis Date Noted   Fear of flying 06/09/2019   Panic anxiety syndrome 07/05/2018   Gastroparesis 04/11/2018   Anorexia nervosa    Attention deficit hyperactivity disorder (ADHD) 01/14/2018   Obstructive sleep apnea syndrome 01/14/2018   Vitamin D deficiency 01/14/2018   Attention and concentration deficit  12/01/2017   Chronic idiopathic constipation 12/01/2017   Vomiting associated with bulimia nervosa with nausea 06/22/2017   Routine general medical examination at a health care facility 08/26/2016   PCOS (polycystic ovarian syndrome) 04/03/2016   Essential hypertension 02/20/2015   GE reflux     Past Surgical History:  Procedure Laterality Date   CHOLECYSTECTOMY N/A 11/25/2016   Procedure: LAPAROSCOPIC CHOLECYSTECTOMY;  Surgeon: Axel Filler, MD;  Location: [facility] OR;  Service: General;  Laterality: N/A;   ESOPHAGOGASTRODUODENOSCOPY N/A 09/26/2016   Procedure: ESOPHAGOGASTRODUODENOSCOPY (EGD);  Surgeon: Beverley Fiedler, MD;  Location: [facility];  Service: Endoscopy;  Laterality: N/A;   eustachion tubes      OB History   No obstetric history on file.      Home Medications    Prior to Admission medications   Medication Sig Start Date End Date Taking? Authorizing Provider  ALPRAZolam Prudy Feeler) 0.5 MG tablet Take 1-2 tablets 30-45 minutes prior to flights as needed 01/26/23   Olive Bass, FNP  dicyclomine (BENTYL) 10 MG capsule Take 1 capsule (10 mg total) by mouth 3 (three) times daily. 05/08/22   Olive Bass, FNP  norgestimate-ethinyl estradiol (ORTHO-CYCLEN) 0.25-35 MG-MCG tablet Take 1 tablet by mouth daily. 09/22/22   Marny Lowenstein, PA-C  nystatin cream (MYCOSTATIN) Apply to affected area 2 times daily 09/22/22   Marny Lowenstein, PA-C  omeprazole (PRILOSEC)  20 MG capsule Take 1 capsule (20 mg total) by mouth daily. 06/30/22 06/30/23  Olive Bass, FNP  ondansetron (ZOFRAN) 4 MG tablet Take 1 tablet (4 mg total) by mouth every 8 (eight) hours as needed for nausea or vomiting. 04/19/23   Freddy Finner, NP  triamcinolone ointment (KENALOG) 0.5 % Apply 1 Application topically 2 (two) times daily. 04/19/23   Marny Lowenstein, PA-C    Family History Family History  Problem Relation Age of Onset   GER disease Mother    Hypertension Mother    GER disease  Father    Hypertension Father    Lactose intolerance Brother    Hypertension Maternal Grandfather    Diabetes Maternal Grandfather    Stroke Maternal Grandfather    Diabetes Paternal Uncle    Cancer Neg Hx    Depression Neg Hx    Drug abuse Neg Hx    Early death Neg Hx    Heart disease Neg Hx    Hyperlipidemia Neg Hx    Kidney disease Neg Hx    Alcohol abuse Neg Hx    Asthma Neg Hx     Social History Social History   Tobacco Use   Smoking status: Never   Smokeless tobacco: Never  Vaping Use   Vaping Use: Former   Quit date: 04/18/2020  Substance Use Topics   Alcohol use: No   Drug use: No     Allergies   Guanfacine, Nortriptyline, Pineapple, and Other   Review of Systems Review of Systems Per HPI  Physical Exam Triage Vital Signs ED Triage Vitals  Enc Vitals Group     BP 04/19/23 1202 107/75     Pulse Rate 04/19/23 1202 86     Resp 04/19/23 1202 18     Temp 04/19/23 1202 98.6 F (37 C)     Temp Source 04/19/23 1202 Oral     SpO2 04/19/23 1202 98 %     Weight --      Height --      Head Circumference --      Peak Flow --      Pain Score 04/19/23 1200 0     Pain Loc --      Pain Edu? --      Excl. in GC? --    No data found.  Updated Vital Signs BP 115/80 (BP Location: Left Arm)   Pulse 73   Temp 98.6 F (37 C) (Oral)   Resp 18   LMP 04/13/2023   SpO2 98%   Visual Acuity Right Eye Distance:   Left Eye Distance:   Bilateral Distance:    Right Eye Near:   Left Eye Near:    Bilateral Near:     Physical Exam Constitutional:      General: She is not in acute distress.    Appearance: Normal appearance. She is not toxic-appearing or diaphoretic.  HENT:     Head: Normocephalic and atraumatic.     Mouth/Throat:     Mouth: Mucous membranes are moist.     Pharynx: No posterior oropharyngeal erythema.  Eyes:     Extraocular Movements: Extraocular movements intact.     Conjunctiva/sclera: Conjunctivae normal.  Cardiovascular:     Rate and  Rhythm: Normal rate and regular rhythm.     Pulses: Normal pulses.     Heart sounds: Normal heart sounds.  Pulmonary:     Effort: Pulmonary effort is normal. No respiratory distress.     Breath  sounds: Normal breath sounds.  Abdominal:     General: Bowel sounds are normal. There is no distension.     Palpations: Abdomen is soft.     Tenderness: There is no abdominal tenderness.  Neurological:     General: No focal deficit present.     Mental Status: She is alert and oriented to person, place, and time. Mental status is at baseline.  Psychiatric:        Mood and Affect: Mood normal.        Behavior: Behavior normal.        Thought Content: Thought content normal.        Judgment: Judgment normal.      UC Treatments / Results  Labs (all labs ordered are listed, but only abnormal results are displayed) Labs Reviewed  COMPREHENSIVE METABOLIC PANEL  CBC    EKG   Radiology No results found.  Procedures Procedures (including critical care time)  Medications Ordered in UC Medications  ondansetron (ZOFRAN-ODT) disintegrating tablet 4 mg (4 mg Oral Given 04/19/23 1205)  sodium chloride 0.9 % bolus 1,000 mL (0 mLs Intravenous Stopped 04/19/23 1320)    Initial Impression / Assessment and Plan / UC Course  I have reviewed the triage vital signs and the nursing notes.  Pertinent labs & imaging results that were available during my care of the patient were reviewed by me and considered in my medical decision making (see chart for details).     Suspect possible viral illness versus food related illness.  No signs of acute abdomen on exam that would warrant emergent evaluation or imaging of the abdomen.  I am concerned for dehydration given patient has not been able to tolerate any food or fluids for the past 3 days.  She also reports history of kidney failure with similar symptoms.  Therefore, fluid bolus was administered with patient stating that she felt much better.  Will obtain  CMP and CBC as well.  Patient encouraged to use ondansetron to take as needed at home that was prescribed by other healthcare provider and to ensure adequate fluid hydration and bland diet.  Advised strict ER precautions.  Patient verbalized understanding and was agreeable with plan. Final Clinical Impressions(s) / UC Diagnoses   Final diagnoses:  Nausea vomiting and diarrhea  Viral illness     Discharge Instructions      Please take ondansetron nausea medication as needed that was prescribed by other healthcare provider.  Ensure adequate fluid hydration.  Go to the ER if any symptoms persist or worsen.     ED Prescriptions   None    PDMP not reviewed this encounter.   Gustavus Bryant, [place] 04/19/23 1343

## 2023-04-20 LAB — COMPREHENSIVE METABOLIC PANEL
ALT: 132 IU/L — ABNORMAL HIGH (ref 0–32)
AST: 45 IU/L — ABNORMAL HIGH (ref 0–40)
Albumin/Globulin Ratio: 1.1
Albumin: 4.1 g/dL (ref 4.0–5.0)
Alkaline Phosphatase: 94 IU/L (ref 44–121)
BUN/Creatinine Ratio: 15 (ref 9–23)
BUN: 13 mg/dL (ref 6–20)
Bilirubin Total: 0.5 mg/dL (ref 0.0–1.2)
CO2: 21 mmol/L (ref 20–29)
Calcium: 9.5 mg/dL (ref 8.7–10.2)
Chloride: 102 mmol/L (ref 96–106)
Creatinine, Ser: 0.84 mg/dL (ref 0.57–1.00)
Globulin, Total: 3.6 g/dL (ref 1.5–4.5)
Glucose: 82 mg/dL (ref 70–99)
Potassium: 3.7 mmol/L (ref 3.5–5.2)
Sodium: 140 mmol/L (ref 134–144)
Total Protein: 7.7 g/dL (ref 6.0–8.5)
eGFR: 98 mL/min/{1.73_m2} (ref >59)

## 2023-04-20 LAB — CBC
Hematocrit: 41 % (ref 34.0–46.6)
Hemoglobin: 13.2 g/dL (ref 11.1–15.9)
MCH: 25.8 pg — ABNORMAL LOW (ref 26.6–33.0)
MCHC: 32.2 g/dL (ref 31.5–35.7)
MCV: 80 fL (ref 79–97)
Platelets: 291 10*3/uL (ref 150–450)
RBC: 5.11 x10E6/uL (ref 3.77–5.28)
RDW: 12.4 % (ref 11.7–15.4)
WBC: 6.4 10*3/uL (ref 3.4–10.8)

## 2023-05-06 DIAGNOSIS — Z3A17 17 weeks gestation of pregnancy: Secondary | ICD-10-CM | POA: Diagnosis not present

## 2023-05-06 DIAGNOSIS — Z3492 Encounter for supervision of normal pregnancy, unspecified, second trimester: Secondary | ICD-10-CM | POA: Diagnosis not present

## 2023-05-18 DIAGNOSIS — Z148 Genetic carrier of other disease: Secondary | ICD-10-CM | POA: Diagnosis not present

## 2023-05-18 DIAGNOSIS — O4442 Low lying placenta NOS or without hemorrhage, second trimester: Secondary | ICD-10-CM | POA: Diagnosis not present

## 2023-05-18 DIAGNOSIS — Z3A19 19 weeks gestation of pregnancy: Secondary | ICD-10-CM | POA: Diagnosis not present

## 2023-05-18 DIAGNOSIS — Z363 Encounter for antenatal screening for malformations: Secondary | ICD-10-CM | POA: Diagnosis not present

## 2023-05-18 DIAGNOSIS — O321XX0 Maternal care for breech presentation, not applicable or unspecified: Secondary | ICD-10-CM | POA: Diagnosis not present

## 2023-05-18 DIAGNOSIS — O99212 Obesity complicating pregnancy, second trimester: Secondary | ICD-10-CM | POA: Diagnosis not present

## 2023-05-25 ENCOUNTER — Encounter: Payer: Self-pay | Admitting: Family

## 2023-05-25 ENCOUNTER — Ambulatory Visit (INDEPENDENT_AMBULATORY_CARE_PROVIDER_SITE_OTHER): Payer: 59 | Admitting: Family

## 2023-05-25 VITALS — BP 104/68 | HR 105 | Ht 64.0 in | Wt 203.6 lb

## 2023-05-25 DIAGNOSIS — M545 Low back pain, unspecified: Secondary | ICD-10-CM | POA: Diagnosis not present

## 2023-05-25 DIAGNOSIS — F909 Attention-deficit hyperactivity disorder, unspecified type: Secondary | ICD-10-CM

## 2023-05-25 DIAGNOSIS — M791 Myalgia, unspecified site: Secondary | ICD-10-CM | POA: Diagnosis not present

## 2023-05-25 DIAGNOSIS — E559 Vitamin D deficiency, unspecified: Secondary | ICD-10-CM | POA: Diagnosis not present

## 2023-05-25 NOTE — Progress Notes (Signed)
Stacey Holland is a 27 y.o. female with the following history as recorded in [employer]:  Patient Active Problem List   Diagnosis Date Noted   Fear of flying 06/09/2019   Panic anxiety syndrome 07/05/2018   Gastroparesis 04/11/2018   Anorexia nervosa    Attention deficit hyperactivity disorder (ADHD) 01/14/2018   Obstructive sleep apnea syndrome 01/14/2018   Vitamin D deficiency 01/14/2018   Attention and concentration deficit 12/01/2017   Chronic idiopathic constipation 12/01/2017   Vomiting associated with bulimia nervosa with nausea 06/22/2017   Routine general medical examination at a health care facility 08/26/2016   PCOS (polycystic ovarian syndrome) 04/03/2016   Essential hypertension 02/20/2015   GE reflux     Current Outpatient Medications  Medication Sig Dispense Refill   dicyclomine (BENTYL) 10 MG capsule Take 1 capsule (10 mg total) by mouth 3 (three) times daily. 270 capsule 3   norgestimate-ethinyl estradiol (ORTHO-CYCLEN) 0.25-35 MG-MCG tablet Take 1 tablet by mouth daily. 28 tablet 11   omeprazole (PRILOSEC) 20 MG capsule Take 1 capsule (20 mg total) by mouth daily. 90 capsule 3   triamcinolone ointment (KENALOG) 0.5 % Apply 1 Application topically 2 (two) times daily. 30 g 0   ALPRAZolam (XANAX) 0.5 MG tablet Take 1-2 tablets 30-45 minutes prior to flights as needed (Patient not taking: Reported on 05/25/2023) 10 tablet 0   nystatin cream (MYCOSTATIN) Apply to affected area 2 times daily (Patient not taking: Reported on 05/25/2023) 15 g 0   ondansetron (ZOFRAN) 4 MG tablet Take 1 tablet (4 mg total) by mouth every 8 (eight) hours as needed for nausea or vomiting. (Patient not taking: Reported on 05/25/2023) 15 tablet 0   No current facility-administered medications for this visit.    Allergies: Guanfacine, Nortriptyline, Pineapple, and Other  Past Medical History:  Diagnosis Date   Anorexia nervosa    Dyspnea    breathing heavyeven when sitting, lying dpwn,exertion    GERD (gastroesophageal reflux disease)    Headache    History of blood transfusion    prematurity, this was during perinatal period   Hypertension    Morbid obesity (HCC) 04/03/2016   PCOS (polycystic ovarian syndrome) 04/03/2016   Reflux    Syncope     Past Surgical History:  Procedure Laterality Date   CHOLECYSTECTOMY N/A 11/25/2016   Procedure: LAPAROSCOPIC CHOLECYSTECTOMY;  Surgeon: Axel Filler, MD;  Location: [facility] OR;  Service: General;  Laterality: N/A;   ESOPHAGOGASTRODUODENOSCOPY N/A 09/26/2016   Procedure: ESOPHAGOGASTRODUODENOSCOPY (EGD);  Surgeon: Beverley Fiedler, MD;  Location: [facility];  Service: Endoscopy;  Laterality: N/A;   eustachion tubes      Family History  Problem Relation Age of Onset   GER disease Mother    Hypertension Mother    GER disease Father    Hypertension Father    Lactose intolerance Brother    Hypertension Maternal Grandfather    Diabetes Maternal Grandfather    Stroke Maternal Grandfather    Diabetes Paternal Uncle    Cancer Neg Hx    Depression Neg Hx    Drug abuse Neg Hx    Early death Neg Hx    Heart disease Neg Hx    Hyperlipidemia Neg Hx    Kidney disease Neg Hx    Alcohol abuse Neg Hx    Asthma Neg Hx     Social History   Tobacco Use   Smoking status: Never   Smokeless tobacco: Never  Substance Use Topics   Alcohol use: No  Subjective:   Diffuse body pain/ joint aches x "months"- feels like more noticeable recently; has been having increased back pain recently; having to take 1000 mg Tylenol daily; Job is sedentary- working close to 40 hours per week; also will be taking full class load-   Has been taking Vitamin D supplement;     Objective:  Vitals:   05/25/23 1540  BP: 104/68  Pulse: (!) 105  SpO2: 98%  Weight: 203 lb 9.6 oz (92.4 kg)  Height: 5\' 4"  (1.626 m)    General: Well developed, well nourished, in no acute distress  Skin : Warm and dry.  Head: Normocephalic and atraumatic  Lungs: Respirations  unlabored;  Musculoskeletal: No deformities; no active joint inflammation  Extremities: No edema, cyanosis, clubbing  Vessels: Symmetric bilaterally  Neurologic: Alert and oriented; speech intact; face symmetrical; moves all extremities well; CNII-XII intact without focal deficit   Assessment:  1. Acute low back pain without sciatica, unspecified back pain laterality   2. Myalgia   3. Vitamin D deficiency   4. Attention deficit hyperactivity disorder (ADHD), unspecified ADHD type     Plan:  Will update labs today- follow up to be determined; discussed updating X-ray of back and due to insurance costs, will hold X-ray at this time; Referral to psychiatrist to discuss treatment options;   No follow-ups on file.  Orders Placed This Encounter  Procedures   DG Lumbar Spine Complete    Standing Status:   Future    Standing Expiration Date:   05/24/2024    Order Specific Question:   Reason for Exam (SYMPTOM  OR DIAGNOSIS REQUIRED)    Answer:   low back pain    Order Specific Question:   Is patient pregnant?    Answer:   No    Order Specific Question:   Preferred imaging location?    Answer:   [facility]   CBC with Differential/Platelet   Comp Met (CMET)   TSH   B12   Vitamin D (25 hydroxy)   Magnesium   Antinuclear Antib (ANA)   Sedimentation rate   Rheumatoid Factor   Ambulatory referral to Psychiatry    Referral Priority:   Routine    Referral Type:   Psychiatric    Referral Reason:   Specialty Services Required    Requested Specialty:   Psychiatry    Number of Visits Requested:   1    Requested Prescriptions    No prescriptions requested or ordered in this encounter

## 2023-05-26 LAB — COMPREHENSIVE METABOLIC PANEL
ALT: 9 U/L (ref 0–35)
AST: 12 U/L (ref 0–37)
Albumin: 3.8 g/dL (ref 3.5–5.2)
Alkaline Phosphatase: 67 U/L (ref 39–117)
BUN: 12 mg/dL (ref 6–23)
CO2: 25 mEq/L (ref 19–32)
Calcium: 9.2 mg/dL (ref 8.4–10.5)
Chloride: 104 mEq/L (ref 96–112)
Creatinine, Ser: 0.84 mg/dL (ref 0.40–1.20)
GFR: 95.2 mL/min (ref >60.00)
Glucose, Bld: 86 mg/dL (ref 70–99)
Potassium: 4.1 mEq/L (ref 3.5–5.1)
Sodium: 138 mEq/L (ref 135–145)
Total Bilirubin: 0.2 mg/dL (ref 0.2–1.2)
Total Protein: 7.2 g/dL (ref 6.0–8.3)

## 2023-05-26 LAB — CBC WITH DIFFERENTIAL/PLATELET
Basophils Absolute: 0.1 10*3/uL (ref 0.0–0.1)
Basophils Relative: 1 % (ref 0.0–3.0)
Eosinophils Absolute: 0.1 10*3/uL (ref 0.0–0.7)
Eosinophils Relative: 1.1 % (ref 0.0–5.0)
HCT: 38.2 % (ref 36.0–46.0)
Hemoglobin: 12.3 g/dL (ref 12.0–15.0)
Lymphocytes Relative: 48.2 % — ABNORMAL HIGH (ref 12.0–46.0)
Lymphs Abs: 2.8 10*3/uL (ref 0.7–4.0)
MCHC: 32.1 g/dL (ref 30.0–36.0)
MCV: 79.9 fl (ref 78.0–100.0)
Monocytes Absolute: 0.6 10*3/uL (ref 0.1–1.0)
Monocytes Relative: 9.6 % (ref 3.0–12.0)
Neutro Abs: 2.4 10*3/uL (ref 1.4–7.7)
Neutrophils Relative %: 40.1 % — ABNORMAL LOW (ref 43.0–77.0)
Platelets: 305 10*3/uL (ref 150.0–400.0)
RBC: 4.78 Mil/uL (ref 3.87–5.11)
RDW: 12.3 % (ref 11.5–15.5)
WBC: 5.9 10*3/uL (ref 4.0–10.5)

## 2023-05-26 LAB — TSH: TSH: 1.14 u[IU]/mL (ref 0.35–5.50)

## 2023-05-26 LAB — SEDIMENTATION RATE: Sed Rate: 50 mm/hr — ABNORMAL HIGH (ref 0–20)

## 2023-05-26 LAB — RHEUMATOID FACTOR: Rheumatoid fact SerPl-aCnc: <10 IU/mL (ref <14)

## 2023-05-26 LAB — MAGNESIUM: Magnesium: 1.8 mg/dL (ref 1.5–2.5)

## 2023-05-26 LAB — ANA: Anti Nuclear Antibody (ANA): NEGATIVE

## 2023-05-26 LAB — VITAMIN D 25 HYDROXY (VIT D DEFICIENCY, FRACTURES): VITD: 54.47 ng/mL (ref 30.00–100.00)

## 2023-05-26 LAB — VITAMIN B12: Vitamin B-12: 279 pg/mL (ref 211–911)

## 2023-05-28 ENCOUNTER — Other Ambulatory Visit: Payer: Self-pay | Admitting: Family

## 2023-05-28 ENCOUNTER — Encounter: Payer: Self-pay | Admitting: Family

## 2023-05-28 DIAGNOSIS — K219 Gastro-esophageal reflux disease without esophagitis: Secondary | ICD-10-CM

## 2023-05-28 DIAGNOSIS — M545 Low back pain, unspecified: Secondary | ICD-10-CM

## 2023-05-28 MED ORDER — DICYCLOMINE HCL 10 MG PO CAPS: 10.0000 mg | ORAL_CAPSULE | Freq: Three times a day (TID) | ORAL | 3 refills | Status: DC

## 2023-05-28 MED ORDER — OMEPRAZOLE 20 MG PO CPDR
20.0000 mg | DELAYED_RELEASE_CAPSULE | Freq: Every day | ORAL | 3 refills | Status: DC
Start: 2023-05-28 — End: 2023-05-28

## 2023-06-10 ENCOUNTER — Ambulatory Visit: Payer: 59

## 2023-07-02 DIAGNOSIS — D563 Thalassemia minor: Secondary | ICD-10-CM | POA: Diagnosis not present

## 2023-07-02 DIAGNOSIS — O99012 Anemia complicating pregnancy, second trimester: Secondary | ICD-10-CM | POA: Diagnosis not present

## 2023-07-02 DIAGNOSIS — O99212 Obesity complicating pregnancy, second trimester: Secondary | ICD-10-CM | POA: Diagnosis not present

## 2023-07-02 DIAGNOSIS — O4442 Low lying placenta NOS or without hemorrhage, second trimester: Secondary | ICD-10-CM | POA: Diagnosis not present

## 2023-07-07 DIAGNOSIS — Z3480 Encounter for supervision of other normal pregnancy, unspecified trimester: Secondary | ICD-10-CM | POA: Diagnosis not present

## 2023-07-22 ENCOUNTER — Ambulatory Visit (INDEPENDENT_AMBULATORY_CARE_PROVIDER_SITE_OTHER): Payer: Self-pay | Admitting: Physician Assistant

## 2023-08-13 ENCOUNTER — Encounter: Payer: Self-pay | Admitting: Physician Assistant

## 2023-08-13 ENCOUNTER — Ambulatory Visit (INDEPENDENT_AMBULATORY_CARE_PROVIDER_SITE_OTHER): Payer: 59 | Admitting: Physician Assistant

## 2023-08-13 VITALS — BP 128/82 | HR 80 | Ht 64.0 in | Wt 195.0 lb

## 2023-08-13 DIAGNOSIS — M79671 Pain in right foot: Secondary | ICD-10-CM | POA: Diagnosis not present

## 2023-08-13 DIAGNOSIS — F902 Attention-deficit hyperactivity disorder, combined type: Secondary | ICD-10-CM | POA: Diagnosis not present

## 2023-08-13 MED ORDER — AMPHETAMINE-DEXTROAMPHET ER 10 MG PO CP24: 10.0000 mg | ORAL_CAPSULE | Freq: Every day | ORAL | 0 refills | Status: DC

## 2023-08-13 NOTE — Progress Notes (Signed)
[facility] MD/PA/NP Initial Note  08/13/2023 9:49 AM Stacey Holland  MRN:  [id number]  Chief Complaint:  Chief Complaint   Establish Care    HPI:  Dx with ADHD at [facility] in 2019.   Has had trouble concentrating ever since she was a kid. Numerous thoughts in her mind that keep her from focusing on any one thing.  Gets distracted easily.  She sometimes has more energy. Patient denies increased talkativeness, racing thoughts, impulsivity or risky behaviors, increased spending, increased libido, grandiosity, increased irritability or anger, paranoia, or hallucinations.  Patient is able to enjoy things.  Energy and motivation are good.  Work is going well.   No extreme sadness, tearfulness, or feelings of hopelessness.  Sleeps well most of the time. ADLs and personal hygiene are normal.  Appetite has not changed.  Weight is stable. No anxiety at present.  Denies suicidal or homicidal thoughts.  Visit Diagnosis:    ICD-10-CM   1. Attention deficit hyperactivity disorder (ADHD), combined type  F90.2      Past Psychiatric History:   Past medications for mental health diagnoses include: Xanax, Klonopin, Ativan, BuSpar, nortriptyline, Celexa, Lexapro, trazodone, gabapentin, Lamictal,  Vyvanse, guanfacine, Adderall  Eating Disorder, Anorexia/bulemia about 4 years ago, went to inpatient treatment [address].   Past Medical History:  Past Medical History:  Diagnosis Date   Anorexia nervosa    Dyspnea    breathing heavyeven when sitting, lying dpwn,exertion   GERD (gastroesophageal reflux disease)    Headache    History of blood transfusion    prematurity, this was during perinatal period   Hypertension    Morbid obesity (HCC) 04/03/2016   PCOS (polycystic ovarian syndrome) 04/03/2016   Reflux    Syncope     Past Surgical History:  Procedure Laterality Date   CHOLECYSTECTOMY N/A 11/25/2016   Procedure: LAPAROSCOPIC CHOLECYSTECTOMY;  Surgeon: Axel Filler, MD;  Location: [facility] OR;  Service: General;  Laterality: N/A;   ESOPHAGOGASTRODUODENOSCOPY N/A 09/26/2016   Procedure: ESOPHAGOGASTRODUODENOSCOPY (EGD);  Surgeon: Beverley Fiedler, MD;  Location: [facility];  Service: Endoscopy;  Laterality: N/A;   eustachion tubes      Family Psychiatric History: see below  Family History:  Family History  Problem Relation Age of Onset   GER disease Mother    Hypertension Mother    Arthritis Mother    GER disease Father    Hypertension Father    Healthy Sister    Healthy Brother    Lactose intolerance Brother    Diabetes Paternal Uncle    Hypertension Maternal Grandfather    Diabetes Maternal Grandfather    Stroke Maternal Grandfather    Dementia Maternal Grandmother    Healthy Paternal Grandmother    Cancer Neg Hx    Depression Neg Hx    Drug abuse Neg Hx    Early death Neg Hx    Heart disease Neg Hx    Hyperlipidemia Neg Hx    Kidney disease Neg Hx    Alcohol abuse Neg Hx    Asthma Neg Hx     Social History:  Social History   Socioeconomic History   Marital status: Single    Spouse name: Not on file   Number of children: 0   Years of education: college   Highest education level: Associate degree: academic program  Occupational History   Occupation: Archivist  Tobacco Use   Smoking status: Never   Smokeless tobacco: Never  Vaping Use   Vaping  status: Former   Quit date: 04/18/2020  Substance and Sexual Activity   Alcohol use: No   Drug use: No   Sexual activity: Never    Birth control/protection: Pill, Abstinence  Other Topics Concern   Not on file  Social History Narrative   Studying mass communications at [employer]   Lives at home with family. With sister and bil      Caffeine 0-1 cups   Legal-none   Religious- Islam       Hobbies- watches tv, movies   Introvert but is extroverted when she needs to be   Social Determinants of Corporate investment banker Strain: Not on file  Food Insecurity: Not on  file  Transportation Needs: Not on file  Physical Activity: Not on file  Stress: Not on file  Social Connections: Not on file   Allergies:  Allergies  Allergen Reactions   Guanfacine Other (See Comments)    Hallucinations   Nortriptyline Other (See Comments)    Reports hallucinations   Pineapple Other (See Comments)    Reaction:  Burning    Other Itching, Swelling, Rash and Other (See Comments)    Reaction:  Burning Pt states that she is allergic to all hair dye.      Metabolic Disorder Labs: Lab Results  Component Value Date   HGBA1C 5.2 07/27/2016   No results found for: "PROLACTIN" Lab Results  Component Value Date   CHOL 242 (H) 05/08/2022   TRIG 65 05/08/2022   HDL 82 05/08/2022   CHOLHDL 3.0 05/08/2022   VLDL 42 (H) 07/21/2016   LDLCALC 144 (H) 05/08/2022   LDLCALC 115 (H) 07/21/2016   Lab Results  Component Value Date   TSH 1.14 05/25/2023   TSH 1.15 05/08/2022    Therapeutic Level Labs: No results found for: "LITHIUM" No results found for: "VALPROATE" No results found for: "CBMZ"  Current Medications: Current Outpatient Medications  Medication Sig Dispense Refill   amphetamine-dextroamphetamine (ADDERALL XR) 10 MG 24 hr capsule Take 1 capsule (10 mg total) by mouth daily after breakfast. 30 capsule 0   BIOTIN PO Take by mouth.     omeprazole (PRILOSEC) 20 MG capsule Take 20 mg by mouth daily.     vitamin B-12 (CYANOCOBALAMIN) 50 MCG tablet Take 50 mcg by mouth daily.     ZINC GLUCONATE PO Take by mouth.     ALPRAZolam (XANAX) 0.5 MG tablet Take 1-2 tablets 30-45 minutes prior to flights as needed (Patient not taking: Reported on 05/25/2023) 10 tablet 0   dicyclomine (BENTYL) 10 MG capsule Take 1 capsule (10 mg total) by mouth 3 (three) times daily. (Patient not taking: Reported on 08/13/2023) 270 capsule 3   nystatin cream (MYCOSTATIN) Apply to affected area 2 times daily (Patient not taking: Reported on 05/25/2023) 15 g 0   triamcinolone ointment  (KENALOG) 0.5 % Apply 1 Application topically 2 (two) times daily. (Patient not taking: Reported on 08/13/2023) 30 g 0   No current facility-administered medications for this visit.    Medication Side Effects: none  Orders placed this visit:  No orders of the defined types were placed in this encounter.   Psychiatric Specialty Exam:  Review of Systems  Constitutional: Negative.   HENT: Negative.    Eyes: Negative.   Respiratory: Negative.    Cardiovascular: Negative.   Gastrointestinal: Negative.   Endocrine: Negative.   Genitourinary: Negative.   Musculoskeletal: Negative.   Skin: Negative.   Allergic/Immunologic: Negative.   Neurological: Negative.  Hematological: Negative.   Psychiatric/Behavioral:         See HPI    Blood pressure 128/82, pulse 80, height 5\' 4"  (1.626 m), weight 195 lb (88.5 kg).Body mass index is 33.47 kg/m.  General Appearance: Casual and Well Groomed  Eye Contact:  Good  Speech:  Clear and Coherent and Normal Rate  Volume:  Normal  Mood:  Euthymic  Affect:  Congruent  Thought Process:  Goal Directed and Descriptions of Associations: Circumstantial  Orientation:  Full (Time, Place, and Person)  Thought Content: Logical   Suicidal Thoughts:  No  Homicidal Thoughts:  No  Memory:  WNL  Judgement:  Good  Insight:  Good  Psychomotor Activity:  Normal  Concentration:  Concentration: Fair and Attention Span: Fair  Recall:  Good  Fund of Knowledge: Good  Language: Good  Assets:  Desire for Improvement Financial Resources/Insurance Housing Transportation Vocational/Educational  ADL's:  Intact  Cognition: WNL  Prognosis:  Good   Screenings:  PHQ2-9    Flowsheet Row Office Visit from 09/22/2022 in [facility] for [employer] [facility] [employer] Office Visit from 05/08/2022 in [facility] Office Visit from 03/24/2022 in [facility] at [facility] Nutrition  from 09/21/2018 in [facility] at [facility] Visit from 01/17/2018 in [facility]  PHQ-2 Total Score 0 0 0 1 0  PHQ-9 Total Score -- 0 -- -- --      Flowsheet Row ED from 04/19/2023 in [facility] at [facility])  C-SSRS RISK CATEGORY No Risk      Receiving Psychotherapy: No   Treatment Plan/Recommendations:  PDMP reviewed.  Xanax filled 01/26/2023. I provided 60 minutes of face to face time during this encounter, including time spent before and after the visit in records review, medical decision making, counseling pertinent to today's visit, and charting.   Disc ADHD, treatment options. Has taken Adderall in the past and thinks it helped. Will restart that. Benefits, risks, SE discussed and she accept.   Start Adderall XR 10 mg 1 q am.  Return in 3-4 weeks.   Melony Overly, PA-C

## 2023-08-20 DIAGNOSIS — E669 Obesity, unspecified: Secondary | ICD-10-CM | POA: Diagnosis not present

## 2023-08-20 DIAGNOSIS — O99213 Obesity complicating pregnancy, third trimester: Secondary | ICD-10-CM | POA: Diagnosis not present

## 2023-08-20 DIAGNOSIS — O285 Abnormal chromosomal and genetic finding on antenatal screening of mother: Secondary | ICD-10-CM | POA: Diagnosis not present

## 2023-08-20 DIAGNOSIS — D563 Thalassemia minor: Secondary | ICD-10-CM | POA: Diagnosis not present

## 2023-08-20 DIAGNOSIS — O99212 Obesity complicating pregnancy, second trimester: Secondary | ICD-10-CM | POA: Diagnosis not present

## 2023-09-08 ENCOUNTER — Ambulatory Visit (INDEPENDENT_AMBULATORY_CARE_PROVIDER_SITE_OTHER): Payer: 59 | Admitting: Physician Assistant

## 2023-09-08 ENCOUNTER — Encounter: Payer: Self-pay | Admitting: Physician Assistant

## 2023-09-08 DIAGNOSIS — F902 Attention-deficit hyperactivity disorder, combined type: Secondary | ICD-10-CM

## 2023-09-08 MED ORDER — AMPHETAMINE-DEXTROAMPHET ER 25 MG PO CP24: 25.0000 mg | ORAL_CAPSULE | ORAL | 0 refills | Status: DC

## 2023-09-08 NOTE — Progress Notes (Signed)
[facility] Med Check  Patient ID: Stacey Holland,  MRN: [id number]  PCP: Stacey Bass, FNP  Date of Evaluation: 09/08/2023 Time spent:20 minutes  Chief Complaint:  Chief Complaint   ADHD; Follow-up     HISTORY/CURRENT STATUS: HPI for 1 month med check.  We started Adderall XR last month.  The 10 mg has not helped her focus and concentration much at all but has helped her mood.  Not as anxious or feeling down although those things were not much of a problem anyway.  2 different days she took 2 of the Adderalls and her focus was a little bit better.  She has had no side effects from it.  She is sleeping fine.  No tachycardia or palpitations.  No jitteriness or anxiety.  Denies dizziness, syncope, seizures, numbness, tingling, tremor, tics, unsteady gait, slurred speech, confusion. Denies muscle or joint pain, stiffness, or dystonia.  Individual Medical History/ Review of Systems: Changes? :No   Past medications for mental health diagnoses include: Xanax, Klonopin, Ativan, BuSpar, nortriptyline, Celexa, Lexapro, trazodone, gabapentin, Lamictal,  guanfacine, Adderall   Eating Disorder, Anorexia/bulemia about 4 years ago, went to inpatient treatment [address].  Allergies: Guanfacine, Nortriptyline, Pineapple, and Other  Current Medications:  Current Outpatient Medications:    amphetamine-dextroamphetamine (ADDERALL XR) 25 MG 24 hr capsule, Take 1 capsule by mouth every morning., Disp: 30 capsule, Rfl: 0   BIOTIN PO, Take by mouth., Disp: , Rfl:    omeprazole (PRILOSEC) 20 MG capsule, Take 20 mg by mouth daily., Disp: , Rfl:    Probiotic Product (PROBIOTIC DAILY PO), Take by mouth., Disp: , Rfl:    vitamin B-12 (CYANOCOBALAMIN) 50 MCG tablet, Take 50 mcg by mouth daily., Disp: , Rfl:    ZINC GLUCONATE PO, Take by mouth., Disp: , Rfl:    ALPRAZolam (XANAX) 0.5 MG tablet, Take 1-2 tablets 30-45 minutes prior to flights as needed (Patient not taking: Reported on  05/25/2023), Disp: 10 tablet, Rfl: 0 Medication Side Effects: none  Family Medical/ Social History: Changes? No  MENTAL HEALTH EXAM:  There were no vitals taken for this visit.There is no height or weight on file to calculate BMI.  General Appearance: Casual and Well Groomed  Eye Contact:  Good  Speech:  Clear and Coherent and Normal Rate  Volume:  Normal  Mood:  Euthymic  Affect:  Congruent  Thought Process:  Goal Directed and Descriptions of Associations: Circumstantial  Orientation:  Full (Time, Place, and Person)  Thought Content: Logical   Suicidal Thoughts:  No  Homicidal Thoughts:  No  Memory:  WNL  Judgement:  Good  Insight:  Good  Psychomotor Activity:  Normal  Concentration:  Concentration: Fair and Attention Span: Fair  Recall:  Good  Fund of Knowledge: Good  Language: Good  Assets:  Desire for Improvement Financial Resources/Insurance Housing Transportation Vocational/Educational  ADL's:  Intact  Cognition: WNL  Prognosis:  Good   DIAGNOSES:    ICD-10-CM   1. Attention deficit hyperactivity disorder (ADHD), combined type  F90.2      Receiving Psychotherapy: No   RECOMMENDATIONS:  PDMP reviewed.  Adderall filled 08/13/2023. I provided 20 minutes of face to face time during this encounter, including time spent before and after the visit in records review, medical decision making, counseling pertinent to today's visit, and charting.   Recommend increasing the Adderall.  A note written on prescription pad for her to give to the school is "Stacey Holland is diagnosed with ADHD and is being  treated for it."  Increase Adderall XR to 25 mg, 1 p.o. every morning. Return in 4 weeks.  Melony Overly, PA-C

## 2023-09-14 DIAGNOSIS — Z3A36 36 weeks gestation of pregnancy: Secondary | ICD-10-CM | POA: Diagnosis not present

## 2023-09-14 DIAGNOSIS — D563 Thalassemia minor: Secondary | ICD-10-CM | POA: Diagnosis not present

## 2023-09-14 DIAGNOSIS — O99213 Obesity complicating pregnancy, third trimester: Secondary | ICD-10-CM | POA: Diagnosis not present

## 2023-09-20 DIAGNOSIS — O368130 Decreased fetal movements, third trimester, not applicable or unspecified: Secondary | ICD-10-CM | POA: Diagnosis not present

## 2023-09-20 DIAGNOSIS — Z3A37 37 weeks gestation of pregnancy: Secondary | ICD-10-CM | POA: Diagnosis not present

## 2023-10-02 ENCOUNTER — Other Ambulatory Visit (HOSPITAL_BASED_OUTPATIENT_CLINIC_OR_DEPARTMENT_OTHER): Payer: Self-pay | Admitting: Medical

## 2023-10-02 DIAGNOSIS — E282 Polycystic ovarian syndrome: Secondary | ICD-10-CM

## 2023-10-06 ENCOUNTER — Ambulatory Visit: Payer: 59 | Admitting: Physician Assistant

## 2023-10-06 ENCOUNTER — Encounter: Payer: Self-pay | Admitting: Physician Assistant

## 2023-10-06 DIAGNOSIS — F902 Attention-deficit hyperactivity disorder, combined type: Secondary | ICD-10-CM | POA: Diagnosis not present

## 2023-10-06 MED ORDER — AMPHETAMINE-DEXTROAMPHETAMINE 10 MG PO TABS: ORAL_TABLET | ORAL | 0 refills | Status: DC

## 2023-10-06 MED ORDER — AMPHETAMINE-DEXTROAMPHET ER 30 MG PO CP24: 30.0000 mg | ORAL_CAPSULE | Freq: Every morning | ORAL | 0 refills | Status: DC

## 2023-10-06 NOTE — Progress Notes (Unsigned)
[facility] Med Check  Patient ID: Stacey Holland,  MRN: [id number]  PCP: Olive Bass, FNP  Date of Evaluation: 10/06/2023 Time spent:25 minutes  Chief Complaint:  Chief Complaint   ADD; Follow-up    HISTORY/CURRENT STATUS: HPI for 1 month med check.  We increased the Adderall at the [facility]. It helped for a few weeks, but now it's almost like she's not taking anything. Having a hard time staying on task, gets distracted easily. 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  Wants to do well.   Patient is able to enjoy things.  Energy and motivation are good.  No extreme sadness, tearfulness, or feelings of hopelessness.  Sleeps well. The Adderall isn't affecting her sleep.  ADLs and personal hygiene are normal.  Appetite has not changed.  Weight is stable.  Gets anxious b/c she can't focus well.  But no PA or anxiety otherwise.  Denies suicidal or homicidal thoughts.  Patient denies increased energy with decreased need for sleep, increased talkativeness, racing thoughts, impulsivity or risky behaviors, increased spending, increased libido, grandiosity, increased irritability or anger, paranoia, or hallucinations.  Denies dizziness, syncope, seizures, numbness, tingling, tremor, tics, unsteady gait, slurred speech, confusion. Denies muscle or joint pain, stiffness, or dystonia.  Individual Medical History/ Review of Systems: Changes? :No   Past medications for mental health diagnoses include: Xanax, Klonopin, Ativan, BuSpar, nortriptyline, Celexa, Lexapro, trazodone, gabapentin, Lamictal,  guanfacine, Adderall   Eating Disorder, Anorexia/bulemia about 4 years ago, went to inpatient treatment [address].  Allergies: Guanfacine, Nortriptyline, Pineapple, and Other  Current Medications:  Current Outpatient Medications:    amphetamine-dextroamphetamine (ADDERALL XR) 30 MG 24 hr capsule, Take 1 capsule (30 mg total) by mouth in the morning., Disp:  30 capsule, Rfl: 0   amphetamine-dextroamphetamine (ADDERALL) 10 MG tablet, 1/2 p.o. at lunch and 1/2 p.o. at supper as needed.  Or take 1 p.o. at lunch as needed, Disp: 30 tablet, Rfl: 0   BIOTIN PO, Take by mouth., Disp: , Rfl:    omeprazole (PRILOSEC) 20 MG capsule, Take 20 mg by mouth daily., Disp: , Rfl:    Probiotic Product (PROBIOTIC DAILY PO), Take by mouth., Disp: , Rfl:    vitamin B-12 (CYANOCOBALAMIN) 50 MCG tablet, Take 50 mcg by mouth daily., Disp: , Rfl:    ZINC GLUCONATE PO, Take by mouth., Disp: , Rfl:    ALPRAZolam (XANAX) 0.5 MG tablet, Take 1-2 tablets 30-45 minutes prior to flights as needed (Patient not taking: Reported on 05/25/2023), Disp: 10 tablet, Rfl: 0 Medication Side Effects: none  Family Medical/ Social History: Changes? No  MENTAL HEALTH EXAM:  There were no vitals taken for this visit.There is no height or weight on file to calculate BMI.  General Appearance: Casual and Well Groomed  Eye Contact:  Good  Speech:  Clear and Coherent and Normal Rate  Volume:  Normal  Mood:  Euthymic  Affect:  Congruent  Thought Process:  Goal Directed and Descriptions of Associations: Circumstantial  Orientation:  Full (Time, Place, and Person)  Thought Content: Logical   Suicidal Thoughts:  No  Homicidal Thoughts:  No  Memory:  WNL  Judgement:  Good  Insight:  Good  Psychomotor Activity:  Normal  Concentration:  Concentration: Fair and Attention Span: Fair  Recall:  Good  Fund of Knowledge: Good  Language: Good  Assets:  Communication Skills Desire for Improvement Financial Resources/Insurance Housing Transportation Vocational/Educational  ADL's:  Intact  Cognition: WNL  Prognosis:  Good   DIAGNOSES:    ICD-10-CM   1. Attention deficit hyperactivity disorder (ADHD), combined type  F90.2       Receiving Psychotherapy: No   RECOMMENDATIONS:  PDMP reviewed.  Adderall filled 09/08/2023. I provided 25 minutes of face to face time during this encounter,  including time spent before and after the visit in records review, medical decision making, counseling pertinent to today's visit, and charting.   Recommend increasing Adderall XR but also adding IR for prn use in the afternoon. Explained the pros and cons and she understands and accepts.   Increase Adderall XR to 30 mg, 1 p.o. every morning. Start Adderall 10 mg, 1/2 at lunch and 1/2 at supper prn.  OR 1 po at lunch prn.  No more than 1 po every day.  Return in 4 weeks.  Melony Overly, PA-C

## 2023-10-09 DIAGNOSIS — Z3A39 39 weeks gestation of pregnancy: Secondary | ICD-10-CM | POA: Diagnosis not present

## 2023-10-09 DIAGNOSIS — O368930 Maternal care for other specified fetal problems, third trimester, not applicable or unspecified: Secondary | ICD-10-CM | POA: Diagnosis not present

## 2023-10-12 DIAGNOSIS — Z3A40 40 weeks gestation of pregnancy: Secondary | ICD-10-CM | POA: Diagnosis not present

## 2023-10-12 DIAGNOSIS — O99214 Obesity complicating childbirth: Secondary | ICD-10-CM | POA: Diagnosis not present

## 2023-10-12 DIAGNOSIS — O99820 Streptococcus B carrier state complicating pregnancy: Secondary | ICD-10-CM | POA: Diagnosis not present

## 2023-10-12 DIAGNOSIS — O480 Post-term pregnancy: Secondary | ICD-10-CM | POA: Diagnosis not present

## 2023-10-19 ENCOUNTER — Ambulatory Visit (INDEPENDENT_AMBULATORY_CARE_PROVIDER_SITE_OTHER): Payer: 59 | Admitting: Family

## 2023-10-19 ENCOUNTER — Encounter: Payer: Self-pay | Admitting: Family

## 2023-10-19 VITALS — BP 122/84 | HR 107 | Resp 16 | Ht 64.0 in | Wt 198.2 lb

## 2023-10-19 DIAGNOSIS — R5383 Other fatigue: Secondary | ICD-10-CM | POA: Diagnosis not present

## 2023-10-19 DIAGNOSIS — R7989 Other specified abnormal findings of blood chemistry: Secondary | ICD-10-CM

## 2023-10-19 DIAGNOSIS — E559 Vitamin D deficiency, unspecified: Secondary | ICD-10-CM | POA: Diagnosis not present

## 2023-10-19 NOTE — Progress Notes (Signed)
Stacey Holland is a 27 y.o. female with the following history as recorded in EpicCare:  Patient Active Problem List   Diagnosis Date Noted   Fear of flying 06/09/2019   Panic anxiety syndrome 07/05/2018   Gastroparesis 04/11/2018   Anorexia nervosa    Attention deficit hyperactivity disorder (ADHD) 01/14/2018   Obstructive sleep apnea syndrome 01/14/2018   Vitamin D deficiency 01/14/2018   Attention and concentration deficit 12/01/2017   Chronic idiopathic constipation 12/01/2017   Vomiting associated with bulimia nervosa with nausea 06/22/2017   Routine general medical examination at a health care facility 08/26/2016   PCOS (polycystic ovarian syndrome) 04/03/2016   Essential hypertension 02/20/2015   GE reflux     Current Outpatient Medications  Medication Sig Dispense Refill   amphetamine-dextroamphetamine (ADDERALL XR) 30 MG 24 hr capsule Take 1 capsule (30 mg total) by mouth in the morning. 30 capsule 0   amphetamine-dextroamphetamine (ADDERALL) 10 MG tablet 1/2 p.o. at lunch and 1/2 p.o. at supper as needed.  Or take 1 p.o. at lunch as needed 30 tablet 0   BIOTIN PO Take by mouth.     folic acid (FOLVITE) 400 MCG tablet Take 400 mcg by mouth daily.     omeprazole (PRILOSEC) 20 MG capsule Take 20 mg by mouth daily.     Probiotic Product (PROBIOTIC DAILY PO) Take by mouth.     triamcinolone cream (KENALOG) 0.5 % Apply 1 Application topically 3 (three) times daily.     vitamin B-12 (CYANOCOBALAMIN) 50 MCG tablet Take 50 mcg by mouth daily.     ZINC GLUCONATE PO Take by mouth.     ALPRAZolam (XANAX) 0.5 MG tablet Take 1-2 tablets 30-45 minutes prior to flights as needed (Patient not taking: Reported on 10/19/2023) 10 tablet 0   No current facility-administered medications for this visit.    Allergies: Guanfacine, Nortriptyline, and Other  Past Medical History:  Diagnosis Date   Anorexia nervosa    Dyspnea    breathing heavyeven when sitting, lying dpwn,exertion   GERD  (gastroesophageal reflux disease)    Headache    History of blood transfusion    prematurity, this was during perinatal period   Hypertension    Morbid obesity (HCC) 04/03/2016   PCOS (polycystic ovarian syndrome) 04/03/2016   Reflux    Syncope     Past Surgical History:  Procedure Laterality Date   CHOLECYSTECTOMY N/A 11/25/2016   Procedure: LAPAROSCOPIC CHOLECYSTECTOMY;  Surgeon: Axel Filler, MD;  Location: [facility] OR;  Service: General;  Laterality: N/A;   ESOPHAGOGASTRODUODENOSCOPY N/A 09/26/2016   Procedure: ESOPHAGOGASTRODUODENOSCOPY (EGD);  Surgeon: Beverley Fiedler, MD;  Location: [facility];  Service: Endoscopy;  Laterality: N/A;   eustachion tubes      Family History  Problem Relation Age of Onset   GER disease Mother    Hypertension Mother    Arthritis Mother    GER disease Father    Hypertension Father    Healthy Sister    Healthy Brother    Lactose intolerance Brother    Diabetes Paternal Uncle    Hypertension Maternal Grandfather    Diabetes Maternal Grandfather    Stroke Maternal Grandfather    Dementia Maternal Grandmother    Healthy Paternal Grandmother    Cancer Neg Hx    Depression Neg Hx    Drug abuse Neg Hx    Early death Neg Hx    Heart disease Neg Hx    Hyperlipidemia Neg Hx    Kidney disease Neg  Hx    Alcohol abuse Neg Hx    Asthma Neg Hx     Social History   Tobacco Use   Smoking status: Never   Smokeless tobacco: Never  Substance Use Topics   Alcohol use: No    Subjective:   Patient is requesting updated labs today- has started new medications and wants to make sure everything is "okay"; has been having increased trouble sleeping but does correlate with start of medication for ADD;      Objective:  Vitals:   10/19/23 1314  BP: 122/84  Pulse: (!) 107  Resp: 16  SpO2: 95%  Weight: 198 lb 3.2 oz (89.9 kg)  Height: 5\' 4"  (1.626 m)    General: Well developed, well nourished, in no acute distress  Skin : Warm and dry.  Head:  Normocephalic and atraumatic  Lungs: Respirations unlabored; clear to auscultation bilaterally without wheeze, rales, rhonchi  [facility] exam: normal rate and regular rhythm.  Neurologic: Alert and oriented; speech intact; face symmetrical; moves all extremities well; CNII-XII intact without focal deficit   Assessment:  1. Other fatigue   2. Low vitamin B12 level   3. Vitamin D deficiency     Plan:  Suspect stimulant is affecting her sleep- she has started Adderall in the past 3 months and is still working out proper dosaging; update labs today; follow up to be determined.   No follow-ups on file.  Orders Placed This Encounter  Procedures   CBC with Differential/Platelet   Comp Met (CMET)   TSH   B12   Vitamin D (25 hydroxy)    Requested Prescriptions    No prescriptions requested or ordered in this encounter

## 2023-10-20 LAB — TSH: TSH: 2.15 u[IU]/mL (ref 0.35–5.50)

## 2023-10-20 LAB — COMPREHENSIVE METABOLIC PANEL
ALT: 16 U/L (ref 0–35)
AST: 15 U/L (ref 0–37)
Albumin: 3.9 g/dL (ref 3.5–5.2)
Alkaline Phosphatase: 66 U/L (ref 39–117)
BUN: 12 mg/dL (ref 6–23)
CO2: 27 meq/L (ref 19–32)
Calcium: 9 mg/dL (ref 8.4–10.5)
Chloride: 102 meq/L (ref 96–112)
Creatinine, Ser: 0.77 mg/dL (ref 0.40–1.20)
GFR: 105.38 mL/min (ref >60.00)
Glucose, Bld: 79 mg/dL (ref 70–99)
Potassium: 4.2 meq/L (ref 3.5–5.1)
Sodium: 136 meq/L (ref 135–145)
Total Bilirubin: 0.3 mg/dL (ref 0.2–1.2)
Total Protein: 7.1 g/dL (ref 6.0–8.3)

## 2023-10-20 LAB — CBC WITH DIFFERENTIAL/PLATELET
Basophils Absolute: 0 10*3/uL (ref 0.0–0.1)
Basophils Relative: 0.3 % (ref 0.0–3.0)
Eosinophils Absolute: 0.1 10*3/uL (ref 0.0–0.7)
Eosinophils Relative: 2.1 % (ref 0.0–5.0)
HCT: 40.4 % (ref 36.0–46.0)
Hemoglobin: 12.9 g/dL (ref 12.0–15.0)
Lymphocytes Relative: 44.6 % (ref 12.0–46.0)
Lymphs Abs: 2.4 10*3/uL (ref 0.7–4.0)
MCHC: 31.9 g/dL (ref 30.0–36.0)
MCV: 81.8 fL (ref 78.0–100.0)
Monocytes Absolute: 0.5 10*3/uL (ref 0.1–1.0)
Monocytes Relative: 8.5 % (ref 3.0–12.0)
Neutro Abs: 2.4 10*3/uL (ref 1.4–7.7)
Neutrophils Relative %: 44.5 % (ref 43.0–77.0)
Platelets: 283 10*3/uL (ref 150.0–400.0)
RBC: 4.94 Mil/uL (ref 3.87–5.11)
RDW: 13.1 % (ref 11.5–15.5)
WBC: 5.4 10*3/uL (ref 4.0–10.5)

## 2023-10-20 LAB — VITAMIN B12: Vitamin B-12: 664 pg/mL (ref 211–911)

## 2023-10-20 LAB — VITAMIN D 25 HYDROXY (VIT D DEFICIENCY, FRACTURES): VITD: 35.7 ng/mL (ref 30.00–100.00)

## 2023-10-29 ENCOUNTER — Telehealth: Payer: Self-pay | Admitting: Physician Assistant

## 2023-10-29 NOTE — Telephone Encounter (Signed)
LF 11/27, due 12/25. Will send 12/23.

## 2023-10-29 NOTE — Telephone Encounter (Signed)
Pt called at 1:09p requesting refill of Adderall XR 30mg  to    [id number] [name], [place] - [address] [place] [address] Phone: [phone]  Fax: [phone]    Next appt 1/7

## 2023-11-01 ENCOUNTER — Other Ambulatory Visit: Payer: Self-pay

## 2023-11-01 MED ORDER — AMPHETAMINE-DEXTROAMPHET ER 30 MG PO CP24: 30.0000 mg | ORAL_CAPSULE | Freq: Every morning | ORAL | 0 refills | Status: DC

## 2023-11-01 NOTE — Telephone Encounter (Signed)
 Pended Adderall 30 XR to [facility]

## 2023-11-10 ENCOUNTER — Telehealth: Payer: Self-pay | Admitting: Physician Assistant

## 2023-11-10 DIAGNOSIS — U071 COVID-19: Secondary | ICD-10-CM | POA: Diagnosis not present

## 2023-11-10 MED ORDER — BENZONATATE 100 MG PO CAPS
100.0000 mg | ORAL_CAPSULE | Freq: Three times a day (TID) | ORAL | 0 refills | Status: DC | PRN
Start: 2023-11-10 — End: 2023-11-16

## 2023-11-10 MED ORDER — NIRMATRELVIR/RITONAVIR (PAXLOVID)TABLET
3.0000 | ORAL_TABLET | Freq: Two times a day (BID) | ORAL | 0 refills | Status: AC
Start: 2023-11-10 — End: 2023-11-15

## 2023-11-10 NOTE — Progress Notes (Signed)
 Virtual Visit Consent   NETASHA Holland, you are scheduled for a virtual visit with a [facility] provider today. Just as with appointments in the office, your consent must be obtained to participate. Your consent will be active for this visit and any virtual visit you may have with one of our providers in the next 365 days. If you have a MyChart account, a copy of this consent can be sent to you electronically.  As this is a virtual visit, video technology does not allow for your provider to perform a traditional examination. This may limit your provider's ability to fully assess your condition. If your provider identifies any concerns that need to be evaluated in person or the need to arrange testing (such as labs, EKG, etc.), we will make arrangements to do so. Although advances in technology are sophisticated, we cannot ensure that it will always work on either your end or our end. If the connection with a video visit is poor, the visit may have to be switched to a telephone visit. With either a video or telephone visit, we are not always able to ensure that we have a secure connection.  By engaging in this virtual visit, you consent to the provision of healthcare and authorize for your insurance to be billed (if applicable) for the services provided during this visit. Depending on your insurance coverage, you may receive a charge related to this service.  I need to obtain your verbal consent now. Are you willing to proceed with your visit today? [name] has provided verbal consent on 11/10/2023 for a virtual visit (video or telephone). Delon CHRISTELLA Dickinson, PA-C  Date: 11/10/2023 12:23 PM  Virtual Visit via Video Note   I, Delon CHRISTELLA Dickinson, connected with  Stacey Holland  ([id number], 1996/07/09) on 11/10/23 at 12:15 PM EST by a video-enabled telemedicine application and verified that I am speaking with the correct person using two identifiers.  Location: Patient: Virtual Visit Location  Patient: Home Provider: Virtual Visit Location Provider: Home Office   I discussed the limitations of evaluation and management by telemedicine and the availability of in person appointments. The patient expressed understanding and agreed to proceed.    History of Present Illness: Stacey CASSELMAN is a 28 y.o. who identifies as a female who was assigned female at birth, and is being seen today for Covid 68.  HPI: URI  This is a new problem. The current episode started in the past 7 days (Tested positive for Covid 19 today; Symptoms started about 3 days ago and worsened last night). The problem has been gradually worsening. Maximum temperature: subjective fevers. The fever has been present for Less than 1 day. Associated symptoms include chest pain (burning with cough), congestion, coughing, ear pain (right), headaches, nausea (this morning), rhinorrhea (and post nasal drainage) and a sore throat. Pertinent negatives include no diarrhea, plugged ear sensation, sinus pain, vomiting or wheezing. Associated symptoms comments: Myalgias. Treatments tried: Tylenol , Dayquil generic liquid, advil . The treatment provided mild relief.   Has never had Covid 19 prior.   Problems:  Patient Active Problem List   Diagnosis Date Noted   Fear of flying 06/09/2019   Panic anxiety syndrome 07/05/2018   Gastroparesis 04/11/2018   Anorexia nervosa    Attention deficit hyperactivity disorder (ADHD) 01/14/2018   Obstructive sleep apnea syndrome 01/14/2018   Vitamin D  deficiency 01/14/2018   Attention and concentration deficit 12/01/2017   Chronic idiopathic constipation 12/01/2017   Vomiting associated with bulimia  nervosa with nausea 06/22/2017   Routine general medical examination at a health care facility 08/26/2016   PCOS (polycystic ovarian syndrome) 04/03/2016   Essential hypertension 02/20/2015   GE reflux     Allergies:  Allergies  Allergen Reactions   Guanfacine Other (See Comments)     Hallucinations   Nortriptyline  Other (See Comments)    Reports hallucinations   Other Itching, Swelling, Rash and Other (See Comments)    Reaction:  Burning Pt states that she is allergic to all hair dye.     Medications:  Current Outpatient Medications:    benzonatate  (TESSALON ) 100 MG capsule, Take 1-2 capsules (100-200 mg total) by mouth 3 (three) times daily as needed., Disp: 30 capsule, Rfl: 0   nirmatrelvir /ritonavir  (PAXLOVID ) 20 x 150 MG & 10 x 100MG  TABS, Take 3 tablets by mouth 2 (two) times daily for 5 days. (Take nirmatrelvir  150 mg two tablets twice daily for 5 days and ritonavir  100 mg one tablet twice daily for 5 days) Patient GFR is 105, Disp: 30 tablet, Rfl: 0   ALPRAZolam  (XANAX ) 0.5 MG tablet, Take 1-2 tablets 30-45 minutes prior to flights as needed (Patient not taking: Reported on 10/19/2023), Disp: 10 tablet, Rfl: 0   amphetamine -dextroamphetamine  (ADDERALL XR) 30 MG 24 hr capsule, Take 1 capsule (30 mg total) by mouth in the morning., Disp: 30 capsule, Rfl: 0   amphetamine -dextroamphetamine  (ADDERALL) 10 MG tablet, 1/2 p.o. at lunch and 1/2 p.o. at supper as needed.  Or take 1 p.o. at lunch as needed, Disp: 30 tablet, Rfl: 0   BIOTIN  PO, Take by mouth., Disp: , Rfl:    folic acid  (FOLVITE ) 400 MCG tablet, Take 400 mcg by mouth daily., Disp: , Rfl:    omeprazole  (PRILOSEC) 20 MG capsule, Take 20 mg by mouth daily., Disp: , Rfl:    Probiotic Product (PROBIOTIC DAILY PO), Take by mouth., Disp: , Rfl:    triamcinolone  cream (KENALOG ) 0.5 %, Apply 1 Application topically 3 (three) times daily., Disp: , Rfl:    vitamin B-12 (CYANOCOBALAMIN ) 50 MCG tablet, Take 50 mcg by mouth daily., Disp: , Rfl:    ZINC  GLUCONATE PO, Take by mouth., Disp: , Rfl:   Observations/Objective: Patient is well-developed, well-nourished in no acute distress.  Resting comfortably at home.  Head is normocephalic, atraumatic.  No labored breathing.  Speech is clear and coherent with logical content.   Patient is alert and oriented at baseline.    Assessment and Plan: 1. COVID-19 (Primary) - nirmatrelvir /ritonavir  (PAXLOVID ) 20 x 150 MG & 10 x 100MG  TABS; Take 3 tablets by mouth 2 (two) times daily for 5 days. (Take nirmatrelvir  150 mg two tablets twice daily for 5 days and ritonavir  100 mg one tablet twice daily for 5 days) Patient GFR is 105  Dispense: 30 tablet; Refill: 0 - MyChart COVID-19 home monitoring program; Future - benzonatate  (TESSALON ) 100 MG capsule; Take 1-2 capsules (100-200 mg total) by mouth 3 (three) times daily as needed.  Dispense: 30 capsule; Refill: 0  - Continue OTC symptomatic management of choice - Will send OTC vitamins and supplement information through AVS - Paxlovid  and Tessalon  perles prescribed - Patient enrolled in MyChart symptom monitoring - Push fluids - Rest as needed - Discussed return precautions and when to seek in-person evaluation, sent via AVS as well   Follow Up Instructions: I discussed the assessment and treatment plan with the patient. The patient was provided an opportunity to ask questions and all were answered. The patient  agreed with the plan and demonstrated an understanding of the instructions.  A copy of instructions were sent to the patient via MyChart unless otherwise noted below.    The patient was advised to call back or seek an in-person evaluation if the symptoms worsen or if the condition fails to improve as anticipated.    Delon CHRISTELLA Dickinson, PA-C

## 2023-11-10 NOTE — Patient Instructions (Addendum)
 Stacey JENEANE Stakes, thank you for joining Delon CHRISTELLA Dickinson, PA-C for today's virtual visit.  While this provider is not your primary care provider (PCP), if your PCP is located in our provider database this encounter information will be shared with them immediately following your visit.   A [facility] MyChart account gives you access to today's visit and all your visits, tests, and labs performed at [facility]  click here if you don't have a [facility] MyChart account or go to [email or web address]  Consent: (Patient) Stacey Holland provided verbal consent for this virtual visit at the beginning of the encounter.  Current Medications:  Current Outpatient Medications:    benzonatate  (TESSALON ) 100 MG capsule, Take 1-2 capsules (100-200 mg total) by mouth 3 (three) times daily as needed., Disp: 30 capsule, Rfl: 0   nirmatrelvir /ritonavir  (PAXLOVID ) 20 x 150 MG & 10 x 100MG  TABS, Take 3 tablets by mouth 2 (two) times daily for 5 days. (Take nirmatrelvir  150 mg two tablets twice daily for 5 days and ritonavir  100 mg one tablet twice daily for 5 days) Patient GFR is 105, Disp: 30 tablet, Rfl: 0   ALPRAZolam  (XANAX ) 0.5 MG tablet, Take 1-2 tablets 30-45 minutes prior to flights as needed (Patient not taking: Reported on 10/19/2023), Disp: 10 tablet, Rfl: 0   amphetamine -dextroamphetamine  (ADDERALL XR) 30 MG 24 hr capsule, Take 1 capsule (30 mg total) by mouth in the morning., Disp: 30 capsule, Rfl: 0   amphetamine -dextroamphetamine  (ADDERALL) 10 MG tablet, 1/2 p.o. at lunch and 1/2 p.o. at supper as needed.  Or take 1 p.o. at lunch as needed, Disp: 30 tablet, Rfl: 0   BIOTIN  PO, Take by mouth., Disp: , Rfl:    folic acid  (FOLVITE ) 400 MCG tablet, Take 400 mcg by mouth daily., Disp: , Rfl:    omeprazole  (PRILOSEC) 20 MG capsule, Take 20 mg by mouth daily., Disp: , Rfl:    Probiotic Product (PROBIOTIC DAILY PO), Take by mouth., Disp: , Rfl:    triamcinolone  cream (KENALOG ) 0.5 %,  Apply 1 Application topically 3 (three) times daily., Disp: , Rfl:    vitamin B-12 (CYANOCOBALAMIN ) 50 MCG tablet, Take 50 mcg by mouth daily., Disp: , Rfl:    ZINC  GLUCONATE PO, Take by mouth., Disp: , Rfl:    Medications ordered in this encounter:  Meds ordered this encounter  Medications   nirmatrelvir /ritonavir  (PAXLOVID ) 20 x 150 MG & 10 x 100MG  TABS    Sig: Take 3 tablets by mouth 2 (two) times daily for 5 days. (Take nirmatrelvir  150 mg two tablets twice daily for 5 days and ritonavir  100 mg one tablet twice daily for 5 days) Patient GFR is 105    Dispense:  30 tablet    Refill:  0    Supervising Provider:   BLAISE ALEENE KIDD [[id number]]   benzonatate  (TESSALON ) 100 MG capsule    Sig: Take 1-2 capsules (100-200 mg total) by mouth 3 (three) times daily as needed.    Dispense:  30 capsule    Refill:  0    Supervising Provider:   BLAISE ALEENE KIDD [[id number]]     *If you need refills on other medications prior to your next appointment, please contact your pharmacy*  Follow-Up: Call back or seek an in-person evaluation if the symptoms worsen or if the condition fails to improve as anticipated.  [facility] [phone]  Care Instructions: Can take to lessen severity: Vit C 500mg  twice daily Quercertin 250-500mg  twice daily  Zinc  75-100mg  daily Melatonin 3-6 mg at bedtime Vit D3 1000-2000 IU daily Aspirin 81 mg daily with food Optional: Famotidine  20mg  daily Also can add tylenol /ibuprofen  as needed for fevers and body aches May add Mucinex or Mucinex DM as needed for cough/congestion    Isolation Instructions: You are to isolate at home until you have been fever free for at least 24 hours without a fever-reducing medication, and symptoms have been steadily improving for 24 hours. At that time,  you can end isolation but need to mask for an additional 5 days.   If you must be around other household members who do not have symptoms, you need to make sure that both  you and the family members are masking consistently with a high-quality mask.  If you note any worsening of symptoms despite treatment, please seek an in-person evaluation ASAP. If you note any significant shortness of breath or any chest pain, please seek ER evaluation. Please do not delay care!   COVID-19: What to Do if You Are Sick If you test positive and are an older adult or someone who is at high risk of getting very sick from COVID-19, treatment may be available. Contact a healthcare provider right away after a positive test to determine if you are eligible, even if your symptoms are mild right now. You can also visit a Test to Treat location and, if eligible, receive a prescription from a provider. Don't delay: Treatment must be started within the first few days to be effective. If you have a fever, cough, or other symptoms, you might have COVID-19. Most people have mild illness and are able to recover at home. If you are sick: Keep track of your symptoms. If you have an emergency warning sign (including trouble breathing), call 911. Steps to help prevent the spread of COVID-19 if you are sick If you are sick with COVID-19 or think you might have COVID-19, follow the steps below to care for yourself and to help protect other people in your home and community. Stay home except to get medical care Stay home. Most people with COVID-19 have mild illness and can recover at home without medical care. Do not leave your home, except to get medical care. Do not visit public areas and do not go to places where you are unable to wear a mask. Take care of yourself. Get rest and stay hydrated. Take over-the-counter medicines, such as acetaminophen , to help you feel better. Stay in touch with your doctor. Call before you get medical care. Be sure to get care if you have trouble breathing, or have any other emergency warning signs, or if you think it is an emergency. Avoid public transportation,  ride-sharing, or taxis if possible. Get tested If you have symptoms of COVID-19, get tested. While waiting for test results, stay away from others, including staying apart from those living in your household. Get tested as soon as possible after your symptoms start. Treatments may be available for people with COVID-19 who are at risk for becoming very sick. Don't delay: Treatment must be started early to be effective--some treatments must begin within 5 days of your first symptoms. Contact your healthcare provider right away if your test result is positive to determine if you are eligible. Self-tests are one of several options for testing for the virus that causes COVID-19 and may be more convenient than laboratory-based tests and point-of-care tests. Ask your healthcare provider or your local health department if you need help interpreting your  test results. You can visit your state, tribal, local, and territorial health department's website to look for the latest local information on testing sites. Separate yourself from other people As much as possible, stay in a specific room and away from other people and pets in your home. If possible, you should use a separate bathroom. If you need to be around other people or animals in or outside of the home, wear a well-fitting mask. Tell your close contacts that they may have been exposed to COVID-19. An infected person can spread COVID-19 starting 48 hours (or 2 days) before the person has any symptoms or tests positive. By letting your close contacts know they may have been exposed to COVID-19, you are helping to protect everyone. See COVID-19 and Animals if you have questions about pets. If you are diagnosed with COVID-19, someone from the health department may call you. Answer the call to slow the spread. Monitor your symptoms Symptoms of COVID-19 include fever, cough, or other symptoms. Follow care instructions from your healthcare provider and local  health department. Your local health authorities may give instructions on checking your symptoms and reporting information. When to seek emergency medical attention Look for emergency warning signs* for COVID-19. If someone is showing any of these signs, seek emergency medical care immediately: Trouble breathing Persistent pain or pressure in the chest New confusion Inability to wake or stay awake Pale, gray, or blue-colored skin, lips, or nail beds, depending on skin tone *This list is not all possible symptoms. Please call your medical provider for any other symptoms that are severe or concerning to you. Call 911 or call ahead to your local emergency facility: Notify the operator that you are seeking care for someone who has or may have COVID-19. Call ahead before visiting your doctor Call ahead. Many medical visits for routine care are being postponed or done by phone or telemedicine. If you have a medical appointment that cannot be postponed, call your doctor's office, and tell them you have or may have COVID-19. This will help the office protect themselves and other patients. If you are sick, wear a well-fitting mask You should wear a mask if you must be around other people or animals, including pets (even at home). Wear a mask with the best fit, protection, and comfort for you. You don't need to wear the mask if you are alone. If you can't put on a mask (because of trouble breathing, for example), cover your coughs and sneezes in some other way. Try to stay at least 6 feet away from other people. This will help protect the people around you. Masks should not be placed on young children under age 68 years, anyone who has trouble breathing, or anyone who is not able to remove the mask without help. Cover your coughs and sneezes Cover your mouth and nose with a tissue when you cough or sneeze. Throw away used tissues in a lined trash can. Immediately wash your hands with soap and water for at  least 20 seconds. If soap and water are not available, clean your hands with an alcohol-based hand sanitizer that contains at least 60% alcohol. Clean your hands often Wash your hands often with soap and water for at least 20 seconds. This is especially important after blowing your nose, coughing, or sneezing; going to the bathroom; and before eating or preparing food. Use hand sanitizer if soap and water are not available. Use an alcohol-based hand sanitizer with at least 60% alcohol, covering all  surfaces of your hands and rubbing them together until they feel dry. Soap and water are the best option, especially if hands are visibly dirty. Avoid touching your eyes, nose, and mouth with unwashed hands. Handwashing Tips Avoid sharing personal household items Do not share dishes, drinking glasses, cups, eating utensils, towels, or bedding with other people in your home. Wash these items thoroughly after using them with soap and water or put in the dishwasher. Clean surfaces in your home regularly Clean and disinfect high-touch surfaces (for example, doorknobs, tables, handles, light switches, and countertops) in your sick room and bathroom. In shared spaces, you should clean and disinfect surfaces and items after each use by the person who is ill. If you are sick and cannot clean, a caregiver or other person should only clean and disinfect the area around you (such as your bedroom and bathroom) on an as needed basis. Your caregiver/other person should wait as long as possible (at least several hours) and wear a mask before entering, cleaning, and disinfecting shared spaces that you use. Clean and disinfect areas that may have blood, stool, or body fluids on them. Use household cleaners and disinfectants. Clean visible dirty surfaces with household cleaners containing soap or detergent. Then, use a household disinfectant. Use a product from [employer] List N: Disinfectants for Coronavirus (COVID-19). Be  sure to follow the instructions on the label to ensure safe and effective use of the product. Many products recommend keeping the surface wet with a disinfectant for a certain period of time (look at contact time on the product label). You may also need to wear personal protective equipment, such as gloves, depending on the directions on the product label. Immediately after disinfecting, wash your hands with soap and water for 20 seconds. For completed guidance on cleaning and disinfecting your home, visit Complete Disinfection Guidance. Take steps to improve ventilation at home Improve ventilation (air flow) at home to help prevent from spreading COVID-19 to other people in your household. Clear out COVID-19 virus particles in the air by opening windows, using air filters, and turning on fans in your home. Use this interactive tool to learn how to improve air flow in your home. When you can be around others after being sick with COVID-19 Deciding when you can be around others is different for different situations. Find out when you can safely end home isolation. For any additional questions about your care, contact your healthcare provider or state or local health department. 01/28/2021 Content source: [facility] ([facility]), Division of Viral Diseases This information is not intended to replace advice given to you by your health care provider. Make sure you discuss any questions you have with your health care provider. Document Revised: 03/13/2021 Document Reviewed: 03/13/2021 Elsevier Patient Education  2022 [employer].     If you have been instructed to have an in-person evaluation today at a local Urgent Care facility, please use the link below. It will take you to a list of all of our available [facility], including address, phone number and hours of operation. Please do not delay care.  [facility]  If you or a  family member do not have a primary care provider, use the link below to schedule a visit and establish care. When you choose a [facility] primary care physician or advanced practice provider, you gain a long-term partner in health. Find a Primary Care Provider  Learn more about 's in-office  and virtual care options: [facility] - Get Care Now

## 2023-11-16 ENCOUNTER — Ambulatory Visit (INDEPENDENT_AMBULATORY_CARE_PROVIDER_SITE_OTHER): Payer: 59 | Admitting: Physician Assistant

## 2023-11-16 ENCOUNTER — Encounter: Payer: Self-pay | Admitting: Physician Assistant

## 2023-11-16 DIAGNOSIS — G4700 Insomnia, unspecified: Secondary | ICD-10-CM

## 2023-11-16 DIAGNOSIS — F902 Attention-deficit hyperactivity disorder, combined type: Secondary | ICD-10-CM

## 2023-11-16 MED ORDER — UNABLE TO FIND: 400.0000 mg | Freq: Every day | Status: DC

## 2023-11-16 MED ORDER — AMPHETAMINE-DEXTROAMPHET ER 30 MG PO CP24: 30.0000 mg | ORAL_CAPSULE | Freq: Every day | ORAL | 0 refills | Status: DC

## 2023-11-16 MED ORDER — AMPHETAMINE-DEXTROAMPHET ER 30 MG PO CP24: 30.0000 mg | ORAL_CAPSULE | Freq: Every morning | ORAL | 0 refills | Status: DC

## 2023-11-16 NOTE — Progress Notes (Signed)
 [facility]  Patient ID: Stacey Holland,  MRN: [id number]  PCP: Jason Leita Repine, FNP  Date of Evaluation: 11/16/2023 Time spent:25 minutes  Chief Complaint:  Chief Complaint   ADHD; Follow-up    HISTORY/CURRENT STATUS: HPI for routine med check.  She is doing well after the increase of Adderall at the last visit.  The extended release dose in the morning is working very well and she is not even needing the short acting in the afternoon. States that attention is good without easy distractibility.  Able to focus on things and finish tasks to completion.   Patient is able to enjoy things.  Energy and motivation are good.   No extreme sadness, tearfulness, or feelings of hopelessness.  Still has trouble sleeping.  ADLs and personal hygiene are normal.   No change in memory.  Appetite has not changed.  Weight is stable.   Denies suicidal or homicidal thoughts.  Patient denies increased energy with decreased need for sleep, increased talkativeness, racing thoughts, impulsivity or risky behaviors, increased spending, increased libido, grandiosity, increased irritability or anger, paranoia, or hallucinations.  Denies dizziness, syncope, seizures, numbness, tingling, tremor, tics, unsteady gait, slurred speech, confusion. Denies muscle or joint pain, stiffness, or dystonia.  Individual Medical History/ Review of Systems: Changes? :No   Past medications for mental health diagnoses include: Xanax , Klonopin , Ativan , BuSpar , nortriptyline , Celexa , Lexapro, trazodone , gabapentin, Lamictal,  guanfacine, Adderall   Eating Disorder, Anorexia/bulemia about 4 years ago, went to inpatient treatment [address].  Allergies: Guanfacine, Nortriptyline , and Other  Current Medications:  Current Outpatient Medications:    [START ON 01/04/2024] amphetamine -dextroamphetamine  (ADDERALL XR) 30 MG 24 hr capsule, Take 1 capsule (30 mg total) by mouth daily., Disp: 30 capsule, Rfl: 0    [START ON 12/05/2023] amphetamine -dextroamphetamine  (ADDERALL XR) 30 MG 24 hr capsule, Take 1 capsule (30 mg total) by mouth daily., Disp: 30 capsule, Rfl: 0   folic acid  (FOLVITE ) 400 MCG tablet, Take 400 mcg by mouth daily., Disp: , Rfl:    omeprazole  (PRILOSEC) 20 MG capsule, Take 40 mg by mouth daily., Disp: , Rfl:    UNABLE TO FIND, Take 400 mg by mouth daily. Magnesium  Glycinate, Disp: , Rfl:    vitamin B-12 (CYANOCOBALAMIN ) 50 MCG tablet, Take 50 mcg by mouth daily., Disp: , Rfl:    ZINC  GLUCONATE PO, Take 100 mg by mouth daily., Disp: , Rfl:    ALPRAZolam  (XANAX ) 0.5 MG tablet, Take 1-2 tablets 30-45 minutes prior to flights as needed (Patient not taking: Reported on 11/16/2023), Disp: 10 tablet, Rfl: 0   [START ON 01/31/2024] amphetamine -dextroamphetamine  (ADDERALL XR) 30 MG 24 hr capsule, Take 1 capsule (30 mg total) by mouth in the morning., Disp: 30 capsule, Rfl: 0   amphetamine -dextroamphetamine  (ADDERALL) 10 MG tablet, 1/2 p.o. at lunch and 1/2 p.o. at supper as needed.  Or take 1 p.o. at lunch as needed (Patient not taking: Reported on 11/16/2023), Disp: 30 tablet, Rfl: 0   Probiotic Product (PROBIOTIC DAILY PO), Take by mouth., Disp: , Rfl:    triamcinolone  cream (KENALOG ) 0.5 %, Apply 1 Application topically 3 (three) times daily. (Patient not taking: Reported on 11/16/2023), Disp: , Rfl:  Medication Side Effects: none  Family Medical/ Social History: Changes? No  MENTAL HEALTH EXAM:  Last menstrual period 10/04/2023.There is no height or weight on file to calculate BMI.  General Appearance: Casual and Well Groomed  Eye Contact:  Good  Speech:  Clear and Coherent and Normal Rate  Volume:  Normal  Mood:  Euthymic  Affect:  Congruent  Thought Process:  Goal Directed and Descriptions of Associations: Circumstantial  Orientation:  Full (Time, Place, and Person)  Thought Content: Logical   Suicidal Thoughts:  No  Homicidal Thoughts:  No  Memory:  WNL  Judgement:  Good  Insight:   Good  Psychomotor Activity:  Normal  Concentration:  Concentration: Good and Attention Span: Good  Recall:  Good  Fund of Knowledge: Good  Language: Good  Assets:  Communication Skills Desire for Improvement Financial Resources/Insurance Housing Transportation Vocational/Educational  ADL's:  Intact  Cognition: WNL  Prognosis:  Good   DIAGNOSES:    ICD-10-CM   1. Attention deficit hyperactivity disorder (ADHD), combined type  F90.2     2. Insomnia, unspecified type  G47.00       Receiving Psychotherapy: No   RECOMMENDATIONS:  PDMP reviewed.  Adderall filled 11/05/2023. I provided 25 minutes of face to face time during this encounter, including time spent before and after the visit in records review, medical decision making, counseling pertinent to today's visit, and charting.   I am glad to see her doing so well.  No change in Adderall is needed.  Approximately 18 minutes was spent discussing sleep hygiene.  Recommend magnesium  glycinate 400 mg, use as directed over-the-counter.  Continue Adderall XR  30 mg, 1 p.o. every morning. Continue Adderall 10 mg, 1/2 at lunch and 1/2 at supper prn.  OR 1 po at lunch prn.  No more than 1 po every day. She rarely takes. Ok to send in Rx if needed before next OV. Return in 3 months.  Verneita Cooks, PA-C

## 2023-12-07 ENCOUNTER — Telehealth: Payer: Self-pay | Admitting: Physician Assistant

## 2023-12-07 ENCOUNTER — Other Ambulatory Visit: Payer: Self-pay

## 2023-12-07 MED ORDER — AMPHETAMINE-DEXTROAMPHETAMINE 10 MG PO TABS: ORAL_TABLET | ORAL | 0 refills | Status: DC

## 2023-12-07 NOTE — Telephone Encounter (Signed)
Patient called in for refill on Adderall 30mg . Ph: [phone] Appt 4/8 Pharmacy [facility] [address] Jacky Kindle

## 2023-12-07 NOTE — Telephone Encounter (Signed)
Called pt to let her know she has an rx rdy to [facility] for 30 mg. She said she was trying to get 10 mg filled. I pended Adderall 10 mg to [facility]

## 2023-12-21 ENCOUNTER — Ambulatory Visit (INDEPENDENT_AMBULATORY_CARE_PROVIDER_SITE_OTHER): Payer: No Typology Code available for payment source | Admitting: Physician Assistant

## 2023-12-21 ENCOUNTER — Encounter: Payer: Self-pay | Admitting: Physician Assistant

## 2023-12-21 DIAGNOSIS — F331 Major depressive disorder, recurrent, moderate: Secondary | ICD-10-CM | POA: Diagnosis not present

## 2023-12-21 DIAGNOSIS — F902 Attention-deficit hyperactivity disorder, combined type: Secondary | ICD-10-CM

## 2023-12-21 DIAGNOSIS — F411 Generalized anxiety disorder: Secondary | ICD-10-CM

## 2023-12-21 MED ORDER — DULOXETINE HCL 30 MG PO CPEP: 30.0000 mg | ORAL_CAPSULE | Freq: Every day | ORAL | 0 refills | Status: DC

## 2023-12-21 MED ORDER — DULOXETINE HCL 60 MG PO CPEP: 60.0000 mg | ORAL_CAPSULE | Freq: Every day | ORAL | 1 refills | Status: DC

## 2023-12-21 MED ORDER — HYDROXYZINE HCL 10 MG PO TABS: 5.0000 mg | ORAL_TABLET | Freq: Three times a day (TID) | ORAL | 1 refills | Status: DC | PRN

## 2023-12-21 NOTE — Progress Notes (Signed)
[facility]  Patient ID: Stacey Holland,  MRN: [id number]  PCP: Olive Bass, FNP  Date of Evaluation: 12/21/2023 Time spent:25 minutes  Chief Complaint:  Chief Complaint   Depression; ADD    HISTORY/CURRENT STATUS: HPI for routine med check.  Having sx of depression, doesn't want to do anything, even skips class a lot. Doesn't have the energy to go. Doesn't sleep all the time, likes to get up and play with her 20 month old nephew. Feels sad sometimes for no reason, there are days when in she does not do anything around the house and then other days where she is very busy, most of the time it is the former.  Personal hygiene suffers when she is experiencing the other symptoms.  Appetite has not changed.  Weight is stable.  States she feels anxious a lot, she cannot get her mind to turn off.  Denies suicidal or homicidal thoughts.  The Adderall is helping with her attention and focus.  It is not quite as effective as it once was though.  She never has increased energy with decreased need for sleep, increased talkativeness, racing thoughts, impulsivity or risky behaviors, increased spending, increased libido, grandiosity, increased irritability or anger, paranoia, or hallucinations.  Denies dizziness, syncope, seizures, numbness, tingling, tremor, tics, unsteady gait, slurred speech, confusion. Denies muscle or joint pain, stiffness, or dystonia.  Individual Medical History/ Review of Systems: Changes? :No   Past medications for mental health diagnoses include: Xanax, Klonopin, Ativan, BuSpar, nortriptyline, Celexa, Lexapro, trazodone, gabapentin, Lamictal,  guanfacine, Adderall   Eating Disorder, Anorexia/bulemia about 4 years ago, went to inpatient treatment [address].  Allergies: Guanfacine, Nortriptyline, and Other  Current Medications:  Current Outpatient Medications:    [START ON 01/31/2024] amphetamine-dextroamphetamine (ADDERALL XR) 30 MG 24 hr  capsule, Take 1 capsule (30 mg total) by mouth in the morning., Disp: 30 capsule, Rfl: 0   [START ON 01/04/2024] amphetamine-dextroamphetamine (ADDERALL XR) 30 MG 24 hr capsule, Take 1 capsule (30 mg total) by mouth daily., Disp: 30 capsule, Rfl: 0   amphetamine-dextroamphetamine (ADDERALL XR) 30 MG 24 hr capsule, Take 1 capsule (30 mg total) by mouth daily., Disp: 30 capsule, Rfl: 0   amphetamine-dextroamphetamine (ADDERALL) 10 MG tablet, 1/2 p.o. at lunch and 1/2 p.o. at supper as needed.  Or take 1 p.o. at lunch as needed, Disp: 30 tablet, Rfl: 0   DULoxetine (CYMBALTA) 30 MG capsule, Take 1 capsule (30 mg total) by mouth daily., Disp: 14 capsule, Rfl: 0   DULoxetine (CYMBALTA) 60 MG capsule, Take 1 capsule (60 mg total) by mouth daily. Beginning after being on the 30 mg for 2 weeks., Disp: 30 capsule, Rfl: 1   folic acid (FOLVITE) 400 MCG tablet, Take 400 mcg by mouth daily., Disp: , Rfl:    hydrOXYzine (ATARAX) 10 MG tablet, Take 0.5-2 tablets (5-20 mg total) by mouth 3 (three) times daily as needed., Disp: 60 tablet, Rfl: 1   omeprazole (PRILOSEC) 20 MG capsule, Take 40 mg by mouth daily., Disp: , Rfl:    UNABLE TO FIND, Take 400 mg by mouth daily. Magnesium Glycinate, Disp: , Rfl:    vitamin B-12 (CYANOCOBALAMIN) 50 MCG tablet, Take 50 mcg by mouth daily., Disp: , Rfl:    ZINC GLUCONATE PO, Take 100 mg by mouth daily., Disp: , Rfl:    ALPRAZolam (XANAX) 0.5 MG tablet, Take 1-2 tablets 30-45 minutes prior to flights as needed (Patient not taking: Reported on 11/16/2023), Disp: 10 tablet, Rfl: 0  triamcinolone cream (KENALOG) 0.5 %, Apply 1 Application topically 3 (three) times daily. (Patient not taking: Reported on 12/21/2023), Disp: , Rfl:  Medication Side Effects: none  Family Medical/ Social History: Changes? No  MENTAL HEALTH EXAM:  There were no vitals taken for this visit.There is no height or weight on file to calculate BMI.  General Appearance: Casual and Well Groomed  Eye Contact:   Good  Speech:  Clear and Coherent and Normal Rate  Volume:  Normal  Mood:   sad  Affect:  Congruent  Thought Process:  Goal Directed and Descriptions of Associations: Circumstantial  Orientation:  Full (Time, Place, and Person)  Thought Content: Logical   Suicidal Thoughts:  No  Homicidal Thoughts:  No  Memory:  WNL  Judgement:  Good  Insight:  Good  Psychomotor Activity:  Normal  Concentration:  Concentration: Good and Attention Span: Good  Recall:  Good  Fund of Knowledge: Good  Language: Good  Assets:  Communication Skills Desire for Improvement Financial Resources/Insurance Housing Transportation Vocational/Educational  ADL's:  Intact  Cognition: WNL  Prognosis:  Good   DIAGNOSES:    ICD-10-CM   1. Major depressive disorder, recurrent episode, moderate (HCC)  F33.1     2. Generalized anxiety disorder  F41.1     3. Attention deficit hyperactivity disorder (ADHD), combined type  F90.2       Receiving Psychotherapy: No   RECOMMENDATIONS:  PDMP reviewed.  Adderall filled 12/07/2023. I provided 25 minutes of face to face time during this encounter, including time spent before and after the visit in records review, medical decision making, counseling pertinent to today's visit, and charting.   We discussed her symptoms.  Differential diagnoses include major depressive disorder, bipolar depression, situational depression.  Her symptoms lean more toward major depressive disorder  and I recommend adding an antidepressant.  That can also help the anxiety that she has.  We discussed SSRIs, SNRIs, Wellbutrin but I would not want to start that since she is having more anxiety.  We agree for her to start an SNRI, she has never tried 1 before but has taken 2 different SSRIs.   I also recommend adding hydroxyzine for as needed use. Benefits, risk and side effects were discussed and she accepts.   She requested a note saying that I do not recommend she return to school this semester.   I agree.  This was written out on a prescription pad but if needed I can dictate a letter on letterhead.  The note says: Stacey Holland is unable to go to school this semester due to mental health reasons.  She is starting a new medication which may take 6 weeks to work.  She needs to withdraw from classes."  Patient states this will make it easier to discuss with her mom as well.   Continue Adderall XR  30 mg, 1 p.o. every morning. Continue Adderall 10 mg, 1/2 at lunch and 1/2 at supper prn.  OR 1 po at lunch prn.   Start Cymbalta 30 mg daily for 2 weeks and then increase to 60 mg daily. Start hydroxyzine 10 mg, 1/2-2 p.o. 3 times daily as needed anxiety or sleep. Recommend counseling.  It is not something she is interested in at this time. Return in 6 weeks.  Melony Overly, PA-C

## 2023-12-31 ENCOUNTER — Telehealth: Payer: Self-pay

## 2023-12-31 ENCOUNTER — Other Ambulatory Visit: Payer: Self-pay | Admitting: Physician Assistant

## 2023-12-31 MED ORDER — AMPHETAMINE-DEXTROAMPHETAMINE 10 MG PO TABS: ORAL_TABLET | ORAL | 0 refills | Status: DC

## 2023-12-31 NOTE — Telephone Encounter (Signed)
Pt called stating pharmacy would not allow her to fill rx. Adderall 10 mg BID. I called pharmacy they said they do not fill early at that location. I let advised them that we changed the dosing, the pharmacist said the earliest they can fill is Sunday.  PT notified and said she is fine with that.

## 2023-12-31 NOTE — Telephone Encounter (Signed)
Ok to increase, I went ahead and sent it in.  Thanks.

## 2023-12-31 NOTE — Telephone Encounter (Signed)
Patient is prescribed Adderall XR 30 and Adderall IR 10 mg, 1/2 BID.  She is reporting the 1/2 of 10 mg is not effective and she increased it to 10 mg BID. Last filled 1/28, due 2/26. She is asking for a dose increase in the IR dosing. She reports she has one day left.   Last seen on 2/11.  [facility] [address] pend RF as appropriate.

## 2024-01-12 ENCOUNTER — Other Ambulatory Visit: Payer: Self-pay | Admitting: Physician Assistant

## 2024-01-14 ENCOUNTER — Ambulatory Visit
Admission: EM | Admit: 2024-01-14 | Discharge: 2024-01-14 | Disposition: A | Discharge status: Home / Self Care | Attending: Family Medicine | Admitting: Family Medicine

## 2024-01-14 DIAGNOSIS — M76891 Other specified enthesopathies of right lower limb, excluding foot: Secondary | ICD-10-CM | POA: Diagnosis not present

## 2024-01-14 DIAGNOSIS — I10 Essential (primary) hypertension: Secondary | ICD-10-CM

## 2024-01-14 MED ORDER — TIZANIDINE HCL 4 MG PO TABS
4.0000 mg | ORAL_TABLET | Freq: Two times a day (BID) | ORAL | 0 refills | Status: DC | PRN
Start: 2024-01-14 — End: 2024-04-06

## 2024-01-14 MED ORDER — PREDNISONE 10 MG (21) PO TBPK
ORAL_TABLET | Freq: Every day | ORAL | 0 refills | Status: DC
Start: 2024-01-14 — End: 2024-01-25

## 2024-01-14 MED ORDER — KETOROLAC TROMETHAMINE 30 MG/ML IJ SOLN
30.0000 mg | Freq: Once | INTRAMUSCULAR | Status: AC
  Administered 2024-01-14: 30 mg via INTRAMUSCULAR

## 2024-01-14 NOTE — ED Triage Notes (Signed)
 Pt states right hip pain. States it has been for the past 1-2 years worse in the past 2 weeks.  States she is taking motrin and tylenol at home with not much relief. Pt ambulates with a steady gait.

## 2024-01-14 NOTE — ED Provider Notes (Signed)
 [facility]    CSN: [id number] Arrival date & time: 01/14/24  1117      History   Chief Complaint Chief Complaint  Patient presents with   Hip Pain    HPI Stacey Holland is a 28 y.o. female presents for hip pain.  Patient reports 1 to 2 years of a intermittent right hip aching that seems to have become persistent and worse over the past 2 weeks.  Denies any known injury or inciting event.  States the pain is a deep aching pain that is worse with movement.  Pain is anteriorly and radiates around her hip.  Denies any numbness/tingling/weakness of her lower extremities, no saddle paresthesia or bowel or bladder issues.  Denies history of hip surgeries or injuries in the past.  She has been taking Motrin and Tylenol with minimal improvement.  Patient also reports elevated blood pressure readings.  She denies history of hypertension but chart review shows a history of hypertension listed.  She is not currently on any medications.  No other concerns at this time.   Hip Pain    Past Medical History:  Diagnosis Date   Anorexia nervosa    Dyspnea    breathing heavyeven when sitting, lying dpwn,exertion   GERD (gastroesophageal reflux disease)    Headache    History of blood transfusion    prematurity, this was during perinatal period   Hypertension    Morbid obesity (HCC) 04/03/2016   PCOS (polycystic ovarian syndrome) 04/03/2016   Reflux    Syncope     Patient Active Problem List   Diagnosis Date Noted   Fear of flying 06/09/2019   Panic anxiety syndrome 07/05/2018   Gastroparesis 04/11/2018   Anorexia nervosa    Attention deficit hyperactivity disorder (ADHD) 01/14/2018   Obstructive sleep apnea syndrome 01/14/2018   Vitamin D deficiency 01/14/2018   Attention and concentration deficit 12/01/2017   Chronic idiopathic constipation 12/01/2017   Vomiting associated with bulimia nervosa with nausea 06/22/2017   Routine general medical examination at a health care  facility 08/26/2016   PCOS (polycystic ovarian syndrome) 04/03/2016   Essential hypertension 02/20/2015   GE reflux     Past Surgical History:  Procedure Laterality Date   CHOLECYSTECTOMY N/A 11/25/2016   Procedure: LAPAROSCOPIC CHOLECYSTECTOMY;  Surgeon: Axel Filler, MD;  Location: [facility];  Service: General;  Laterality: N/A;   ESOPHAGOGASTRODUODENOSCOPY N/A 09/26/2016   Procedure: ESOPHAGOGASTRODUODENOSCOPY (EGD);  Surgeon: Beverley Fiedler, MD;  Location: [facility];  Service: Endoscopy;  Laterality: N/A;   eustachion tubes      OB History   No obstetric history on file.      Home Medications    Prior to Admission medications   Medication Sig Start Date End Date Taking? Authorizing Provider  predniSONE (STERAPRED UNI-PAK 21 TAB) 10 MG (21) TBPK tablet Take by mouth daily. Take 6 tabs by mouth daily  for 1 day, then 5 tabs for 1 day, then 4 tabs for 1 day, then 3 tabs for 1 day, 2 tabs for 1 day, then 1 tab by mouth daily for 1 days 01/14/24  Yes Radford Pax, NP  tiZANidine (ZANAFLEX) 4 MG tablet Take 1 tablet (4 mg total) by mouth 2 (two) times daily as needed (hip pain). 01/14/24  Yes Radford Pax, NP  ALPRAZolam Prudy Feeler) 0.5 MG tablet Take 1-2 tablets 30-45 minutes prior to flights as needed Patient not taking: Reported on 11/16/2023 01/26/23   Olive Bass, FNP  amphetamine-dextroamphetamine (  ADDERALL XR) 30 MG 24 hr capsule Take 1 capsule (30 mg total) by mouth in the morning. 01/31/24   Claybon Jabs, Glade Nurse, PA-C  amphetamine-dextroamphetamine (ADDERALL XR) 30 MG 24 hr capsule Take 1 capsule (30 mg total) by mouth daily. 01/04/24   Cherie Ouch, PA-C  amphetamine-dextroamphetamine (ADDERALL XR) 30 MG 24 hr capsule Take 1 capsule (30 mg total) by mouth daily. 12/05/23   Melony Overly T, PA-C  amphetamine-dextroamphetamine (ADDERALL) 10 MG tablet 1 p.o. at lunch and 1 p.o. in the late afternoon. 12/31/23   Melony Overly T, PA-C  DULoxetine (CYMBALTA) 30 MG capsule Take 1 capsule  (30 mg total) by mouth daily. 12/21/23   Cherie Ouch, PA-C  DULoxetine (CYMBALTA) 60 MG capsule Take 1 capsule (60 mg total) by mouth daily. Beginning after being on the 30 mg for 2 weeks. 12/21/23   Cherie Ouch, PA-C  folic acid (FOLVITE) 400 MCG tablet Take 400 mcg by mouth daily.    [provider]  hydrOXYzine (ATARAX) 10 MG tablet Take 0.5-2 tablets (5-20 mg total) by mouth 3 (three) times daily as needed. 12/21/23   Cherie Ouch, PA-C  omeprazole (PRILOSEC) 20 MG capsule Take 40 mg by mouth daily.    [provider]  triamcinolone cream (KENALOG) 0.5 % Apply 1 Application topically 3 (three) times daily. Patient not taking: Reported on 12/21/2023    [provider]  UNABLE TO FIND Take 400 mg by mouth daily. Magnesium Glycinate 11/16/23   Melony Overly T, PA-C  vitamin B-12 (CYANOCOBALAMIN) 50 MCG tablet Take 50 mcg by mouth daily.    [provider]  ZINC GLUCONATE PO Take 100 mg by mouth daily.    [provider]    Family History Family History  Problem Relation Age of Onset   GER disease Mother    Hypertension Mother    Arthritis Mother    GER disease Father    Hypertension Father    Healthy Sister    Healthy Brother    Lactose intolerance Brother    Diabetes Paternal Uncle    Hypertension Maternal Grandfather    Diabetes Maternal Grandfather    Stroke Maternal Grandfather    Dementia Maternal Grandmother    Healthy Paternal Grandmother    Cancer Neg Hx    Depression Neg Hx    Drug abuse Neg Hx    Early death Neg Hx    Heart disease Neg Hx    Hyperlipidemia Neg Hx    Kidney disease Neg Hx    Alcohol abuse Neg Hx    Asthma Neg Hx     Social History Social History   Tobacco Use   Smoking status: Never   Smokeless tobacco: Never  Vaping Use   Vaping status: Former   Quit date: 04/18/2020  Substance Use Topics   Alcohol use: No   Drug use: No     Allergies   Guanfacine, Nortriptyline, and Other   Review  of Systems Review of Systems  Musculoskeletal:        Chronic right hip pain     Physical Exam Triage Vital Signs ED Triage Vitals  Encounter Vitals Group     BP 01/14/24 1130 (!) 156/102     Systolic BP Percentile --      Diastolic BP Percentile --      Pulse Rate 01/14/24 1130 79     Resp 01/14/24 1130 16     Temp 01/14/24 1130 98.2 F (36.8  C)     Temp Source 01/14/24 1130 Oral     SpO2 01/14/24 1130 98 %     Weight --      Height --      Head Circumference --      Peak Flow --      Pain Score 01/14/24 1131 7     Pain Loc --      Pain Education --      Exclude from Growth Chart --    No data found.  Updated Vital Signs BP (!) 156/102 (BP Location: Left Arm)   Pulse 79   Temp 98.2 F (36.8 C) (Oral)   Resp 16   LMP 12/30/2023 (Approximate)   SpO2 98%   Visual Acuity Right Eye Distance:   Left Eye Distance:   Bilateral Distance:    Right Eye Near:   Left Eye Near:    Bilateral Near:     Physical Exam Vitals and nursing note reviewed.  Constitutional:      General: She is not in acute distress.    Appearance: Normal appearance. She is not ill-appearing.  HENT:     Head: Normocephalic and atraumatic.  Eyes:     Pupils: Pupils are equal, round, and reactive to light.  Cardiovascular:     Rate and Rhythm: Normal rate.  Pulmonary:     Effort: Pulmonary effort is normal.  Musculoskeletal:     Right hip: Tenderness present. No deformity, lacerations, bony tenderness or crepitus. Normal range of motion. Normal strength.     Comments: Minimal pain with external rotation of the hip with more pain with internal rotation but normal range of motion.  Equal strength lower extremities bilaterally  Skin:    General: Skin is warm and dry.  Neurological:     General: No focal deficit present.     Mental Status: She is alert and oriented to person, place, and time.  Psychiatric:        Mood and Affect: Mood normal.        Behavior: Behavior normal.      UC  Treatments / Results  Labs (all labs ordered are listed, but only abnormal results are displayed) Labs Reviewed - No data to display  EKG   Radiology No results found.  Procedures Procedures (including critical care time)  Medications Ordered in UC Medications  ketorolac (TORADOL) 30 MG/ML injection 30 mg (30 mg Intramuscular Given 01/14/24 1200)    Initial Impression / Assessment and Plan / UC Course  I have reviewed the triage vital signs and the nursing notes.  Pertinent labs & imaging results that were available during my care of the patient were reviewed by me and considered in my medical decision making (see chart for details).     Reviewed exam and symptoms with patient.  Patient presented with acute on chronic right hip pain.  Discussed likely hip flexor tendinitis.  Patient was given Toradol injection in clinic.  She was monitored for 10 minutes after injection with no reaction noted and tolerated well.  She was instructed no NSAIDs for 24 hours and verbalized understanding.  Will do trial of Flexeril and prednisone, side effect profile reviewed.  Advised patient to keep a BP log checking twice daily for 2 weeks to take this to her PCP for further evaluation and treatment options.  Discussed low-salt diet as well.  Advised patient all follow-up with PCP in 1 week for recheck of her hip as well as discussion of possible  physical therapy if indicated.  ER precautions reviewed and patient verbalized understanding. Final Clinical Impressions(s) / UC Diagnoses   Final diagnoses:  Hip flexor tendinitis, right  Hypertension, unspecified type     Discharge Instructions      You were given a Toradol injection in clinic today. Do not take any over the counter NSAID's such as Advil, ibuprofen, Aleve, or naproxen for 24 hours. You may take tylenol if needed.  Start prednisone taper as prescribed.  You may take Flexeril twice daily as needed.  Please note this medication can make you  drowsy.  Do not drink alcohol or drive while on this medication.  Keep a log of your blood pressure by checking it twice daily for 2 weeks.  Take this to your PCP for further evaluation and possible treatment.  Try to avoid salt as this can cause your blood pressure to elevate.  Also follow-up with your PCP in 1 week for recheck and to discuss possible physical therapy options if indicated at that time.  Please go to the ER for any worsening symptoms.  Hope you feel better soon!     ED Prescriptions     Medication Sig Dispense Auth. Provider   tiZANidine (ZANAFLEX) 4 MG tablet Take 1 tablet (4 mg total) by mouth 2 (two) times daily as needed (hip pain). 10 tablet Radford Pax, NP   predniSONE (STERAPRED UNI-PAK 21 TAB) 10 MG (21) TBPK tablet Take by mouth daily. Take 6 tabs by mouth daily  for 1 day, then 5 tabs for 1 day, then 4 tabs for 1 day, then 3 tabs for 1 day, 2 tabs for 1 day, then 1 tab by mouth daily for 1 days 21 tablet Radford Pax, NP      PDMP not reviewed this encounter.   Radford Pax, NP 01/14/24 [phone]

## 2024-01-14 NOTE — Discharge Instructions (Addendum)
 You were given a Toradol injection in clinic today. Do not take any over the counter NSAID's such as Advil, ibuprofen, Aleve, or naproxen for 24 hours. You may take tylenol if needed.  Start prednisone taper as prescribed.  You may take Flexeril twice daily as needed.  Please note this medication can make you drowsy.  Do not drink alcohol or drive while on this medication.  Keep a log of your blood pressure by checking it twice daily for 2 weeks.  Take this to your PCP for further evaluation and possible treatment.  Try to avoid salt as this can cause your blood pressure to elevate.  Also follow-up with your PCP in 1 week for recheck and to discuss possible physical therapy options if indicated at that time.  Please go to the ER for any worsening symptoms.  Hope you feel better soon!

## 2024-01-17 ENCOUNTER — Ambulatory Visit: Payer: Self-pay | Admitting: Family

## 2024-01-17 NOTE — Telephone Encounter (Signed)
  Chief Complaint: hypertension Symptoms: elevated Bp Frequency: pas two weeks Pertinent Negatives: Patient denies fever, sob, chest pain Disposition: [] ED /[] Urgent Care (no appt availability in office) / [x] Appointment(In office/virtual)/ []  [facility]/ [] Home Care/ [] Refused Recommended Disposition /[] [facility]/ []  Follow-up with PCP Additional Notes: Patient states that she has been taking her blood pressure daily for the past two weeks and it has been high with highest reading yesterday of 176/126.  Patient states that she was diagnosed with htn years ago when she was overweight but has not been on medication in years. Patient states that she has hip pain with has also been contributing to her elevated BP.    Reason for Disposition  Systolic BP  >= 180 OR Diastolic >= 110  Answer Assessment - Initial Assessment Questions 1. BLOOD PRESSURE: "What is the blood pressure?" "Did you take at least two measurements 5 minutes apart?"     Yes, 116/117 2. ONSET: "When did you take your blood pressure?"     This morning 3. HOW: "How did you take your blood pressure?" (e.g., automatic home BP monitor, visiting nurse)     Automatic home monitor 4. HISTORY: "Do you have a history of high blood pressure?"     Yes, years ago 5. MEDICINES: "Are you taking any medicines for blood pressure?" "Have you missed any doses recently?"     none 6. OTHER SYMPTOMS: "Do you have any symptoms?" (e.g., blurred vision, chest pain, difficulty breathing, headache, weakness)     Tiredness, headache, anxious,  Protocols used: Blood Pressure - High-A-AH

## 2024-01-18 ENCOUNTER — Ambulatory Visit (INDEPENDENT_AMBULATORY_CARE_PROVIDER_SITE_OTHER): Admitting: Family

## 2024-01-18 ENCOUNTER — Encounter: Payer: Self-pay | Admitting: Family

## 2024-01-18 ENCOUNTER — Ambulatory Visit: Admitting: Physician Assistant

## 2024-01-18 VITALS — BP 182/110 | HR 73 | Ht 64.0 in | Wt 188.8 lb

## 2024-01-18 DIAGNOSIS — M25559 Pain in unspecified hip: Secondary | ICD-10-CM | POA: Diagnosis not present

## 2024-01-18 DIAGNOSIS — R030 Elevated blood-pressure reading, without diagnosis of hypertension: Secondary | ICD-10-CM | POA: Diagnosis not present

## 2024-01-18 LAB — CBC WITH DIFFERENTIAL/PLATELET
Basophils Absolute: 0 10*3/uL (ref 0.0–0.1)
Basophils Relative: 0.3 % (ref 0.0–3.0)
Eosinophils Absolute: 0 10*3/uL (ref 0.0–0.7)
Eosinophils Relative: 0.1 % (ref 0.0–5.0)
HCT: 39.1 % (ref 36.0–46.0)
Hemoglobin: 12.6 g/dL (ref 12.0–15.0)
Lymphocytes Relative: 24.6 % (ref 12.0–46.0)
Lymphs Abs: 1.8 10*3/uL (ref 0.7–4.0)
MCHC: 32.3 g/dL (ref 30.0–36.0)
MCV: 81.5 fl (ref 78.0–100.0)
Monocytes Absolute: 0.4 10*3/uL (ref 0.1–1.0)
Monocytes Relative: 5.8 % (ref 3.0–12.0)
Neutro Abs: 5.1 10*3/uL (ref 1.4–7.7)
Neutrophils Relative %: 69.2 % (ref 43.0–77.0)
Platelets: 282 10*3/uL (ref 150.0–400.0)
RBC: 4.79 Mil/uL (ref 3.87–5.11)
RDW: 12.8 % (ref 11.5–15.5)
WBC: 7.3 10*3/uL (ref 4.0–10.5)

## 2024-01-18 LAB — COMPREHENSIVE METABOLIC PANEL
ALT: 33 U/L (ref 0–35)
AST: 18 U/L (ref 0–37)
Albumin: 3.7 g/dL (ref 3.5–5.2)
Alkaline Phosphatase: 48 U/L (ref 39–117)
BUN: 12 mg/dL (ref 6–23)
CO2: 26 meq/L (ref 19–32)
Calcium: 9.5 mg/dL (ref 8.4–10.5)
Chloride: 98 meq/L (ref 96–112)
Creatinine, Ser: 0.77 mg/dL (ref 0.40–1.20)
GFR: 105.2 mL/min (ref >60.00)
Glucose, Bld: 77 mg/dL (ref 70–99)
Potassium: 4 meq/L (ref 3.5–5.1)
Sodium: 134 meq/L — ABNORMAL LOW (ref 135–145)
Total Bilirubin: 0.3 mg/dL (ref 0.2–1.2)
Total Protein: 6.9 g/dL (ref 6.0–8.3)

## 2024-01-18 MED ORDER — DULOXETINE HCL 30 MG PO CPEP: ORAL_CAPSULE | ORAL | 0 refills | Status: DC

## 2024-01-18 MED ORDER — AMLODIPINE BESYLATE 5 MG PO TABS: 5.0000 mg | ORAL_TABLET | Freq: Every day | ORAL | 0 refills | Status: DC

## 2024-01-18 NOTE — Progress Notes (Signed)
 Stacey Holland is a 28 y.o. female with the following history as recorded in EpicCare:  Patient Active Problem List   Diagnosis Date Noted   Fear of flying 06/09/2019   Panic anxiety syndrome 07/05/2018   Gastroparesis 04/11/2018   Anorexia nervosa    Attention deficit hyperactivity disorder (ADHD) 01/14/2018   Obstructive sleep apnea syndrome 01/14/2018   Vitamin D deficiency 01/14/2018   Attention and concentration deficit 12/01/2017   Chronic idiopathic constipation 12/01/2017   Vomiting associated with bulimia nervosa with nausea 06/22/2017   Routine general medical examination at a health care facility 08/26/2016   PCOS (polycystic ovarian syndrome) 04/03/2016   Essential hypertension 02/20/2015   GE reflux     Current Outpatient Medications  Medication Sig Dispense Refill   amLODipine (NORVASC) 5 MG tablet Take 1 tablet (5 mg total) by mouth daily. 30 tablet 0   [START ON 01/31/2024] amphetamine-dextroamphetamine (ADDERALL XR) 30 MG 24 hr capsule Take 1 capsule (30 mg total) by mouth in the morning. 30 capsule 0   amphetamine-dextroamphetamine (ADDERALL XR) 30 MG 24 hr capsule Take 1 capsule (30 mg total) by mouth daily. 30 capsule 0   amphetamine-dextroamphetamine (ADDERALL XR) 30 MG 24 hr capsule Take 1 capsule (30 mg total) by mouth daily. 30 capsule 0   amphetamine-dextroamphetamine (ADDERALL) 10 MG tablet 1 p.o. at lunch and 1 p.o. in the late afternoon. 60 tablet 0   folic acid (FOLVITE) 400 MCG tablet Take 400 mcg by mouth daily.     hydrOXYzine (ATARAX) 10 MG tablet Take 0.5-2 tablets (5-20 mg total) by mouth 3 (three) times daily as needed. 60 tablet 1   norgestimate-ethinyl estradiol (ORTHO-CYCLEN) 0.25-35 MG-MCG tablet Take 1 tablet by mouth daily.     omeprazole (PRILOSEC) 20 MG capsule Take 40 mg by mouth daily.     predniSONE (STERAPRED UNI-PAK 21 TAB) 10 MG (21) TBPK tablet Take by mouth daily. Take 6 tabs by mouth daily  for 1 day, then 5 tabs for 1 day, then 4  tabs for 1 day, then 3 tabs for 1 day, 2 tabs for 1 day, then 1 tab by mouth daily for 1 days 21 tablet 0   triamcinolone cream (KENALOG) 0.5 % Apply 1 Application topically 3 (three) times daily.     UNABLE TO FIND Take 400 mg by mouth daily. Magnesium Glycinate     vitamin B-12 (CYANOCOBALAMIN) 50 MCG tablet Take 50 mcg by mouth daily.     ZINC GLUCONATE PO Take 100 mg by mouth daily.     ALPRAZolam (XANAX) 0.5 MG tablet Take 1-2 tablets 30-45 minutes prior to flights as needed (Patient not taking: Reported on 10/19/2023) 10 tablet 0   DULoxetine (CYMBALTA) 30 MG capsule Take as directed to taper off medication 14 capsule 0   tiZANidine (ZANAFLEX) 4 MG tablet Take 1 tablet (4 mg total) by mouth 2 (two) times daily as needed (hip pain). (Patient not taking: Reported on 01/18/2024) 10 tablet 0   No current facility-administered medications for this visit.    Allergies: Guanfacine, Nortriptyline, and Other  Past Medical History:  Diagnosis Date   Anorexia nervosa    Dyspnea    breathing heavyeven when sitting, lying dpwn,exertion   GERD (gastroesophageal reflux disease)    Headache    History of blood transfusion    prematurity, this was during perinatal period   Hypertension    Morbid obesity (HCC) 04/03/2016   PCOS (polycystic ovarian syndrome) 04/03/2016   Reflux  Syncope     Past Surgical History:  Procedure Laterality Date   CHOLECYSTECTOMY N/A 11/25/2016   Procedure: LAPAROSCOPIC CHOLECYSTECTOMY;  Surgeon: Axel Filler, MD;  Location: [facility];  Service: General;  Laterality: N/A;   ESOPHAGOGASTRODUODENOSCOPY N/A 09/26/2016   Procedure: ESOPHAGOGASTRODUODENOSCOPY (EGD);  Surgeon: Beverley Fiedler, MD;  Location: [facility];  Service: Endoscopy;  Laterality: N/A;   eustachion tubes      Family History  Problem Relation Age of Onset   GER disease Mother    Hypertension Mother    Arthritis Mother    GER disease Father    Hypertension Father    Healthy Sister    Healthy  Brother    Lactose intolerance Brother    Diabetes Paternal Uncle    Hypertension Maternal Grandfather    Diabetes Maternal Grandfather    Stroke Maternal Grandfather    Dementia Maternal Grandmother    Healthy Paternal Grandmother    Cancer Neg Hx    Depression Neg Hx    Drug abuse Neg Hx    Early death Neg Hx    Heart disease Neg Hx    Hyperlipidemia Neg Hx    Kidney disease Neg Hx    Alcohol abuse Neg Hx    Asthma Neg Hx     Social History   Tobacco Use   Smoking status: Never   Smokeless tobacco: Never  Substance Use Topics   Alcohol use: No    Subjective:   Accompanied by her mother; was seen at [facility] last week with hip pain; blood pressure was noted to be very elevated at time of OV; per patient, she had been taking regular doses of Advil and Tylenol for a few weeks prior to being seen at [facility]; has been placed on prednisone dose pack- still has 3 days left of prednisone pak and notes has also continue to take Ibuprofen as well;  She did start Cymbalta approximately one month ago- started at 30 mg x 2 weeks and then was increased to 60 mg daily; she does not feel the medication has been beneficial/ was not checking her blood pressure when starting this medication; has been checking blood pressure for the past few days since being seen at [facility] and consistently elevated in the 150-160/ 100 range;     Objective:  Vitals:   01/18/24 1324 01/18/24 1533  BP: (!) 182/110 (!) 182/110  Pulse: 73   SpO2: 100%   Weight: 188 lb 12.8 oz (85.6 kg)   Height: 5\' 4"  (1.626 m)     General: Well developed, well nourished, in no acute distress  Skin : Warm and dry.  Head: Normocephalic and atraumatic  Eyes: Sclera and conjunctiva clear; pupils round and reactive to light; extraocular movements intact  Ears: External normal; canals clear; tympanic membranes normal  Oropharynx: Pink, supple. No suspicious lesions  Neck: Supple without thyromegaly, adenopathy  Lungs: Respirations  unlabored; clear to auscultation bilaterally without wheeze, rales, rhonchi  CVS exam: normal rate and regular rhythm.  Neurologic: Alert and oriented; speech intact; face symmetrical; moves all extremities well; CNII-XII intact without focal deficit   Assessment:  1. Elevated blood pressure reading   2. Hip pain, unspecified laterality     Plan:  ? If pressure is up due to reaction from Cymbalta; blood pressure was normal in December 2024 and patient was on Adderall; the Cymbalta is the newest agent; discussed that she may be taking too many NSAIDs for her hip as well; EKG done today  shows NSR; patient prefers to just stop the Cymbalta as opposed to tapering- she is given short term Rx for Cymbalta if she does decide she wants to taper; she is given short term Rx for Amlodipine to use as directed- she will take today and see how her pressure is tomorrow; she will not take her Cymbalta tonight; she will check her blood pressure tomorrow in the morning and hold the Amlodipine if pressure is normalizing below 150/90; she is to stop NSAIDs as well; she understands to stay off her OCPs also; keep planned follow up for next week; check CBC, CMP today; Refer to orthopedic for further evaluation;   No follow-ups on file.  Orders Placed This Encounter  Procedures   CBC with Differential/Platelet   Comp Met (CMET)   Ambulatory referral to Orthopedic Surgery    Referral Priority:   Routine    Referral Type:   Surgical    Referral Reason:   Specialty Services Required    Requested Specialty:   Orthopedic Surgery    Number of Visits Requested:   1   EKG 12-Lead    Requested Prescriptions   Signed Prescriptions Disp Refills   amLODipine (NORVASC) 5 MG tablet 30 tablet 0    Sig: Take 1 tablet (5 mg total) by mouth daily.   DULoxetine (CYMBALTA) 30 MG capsule 14 capsule 0    Sig: Take as directed to taper off medication

## 2024-01-18 NOTE — Patient Instructions (Addendum)
 Let's have go ahead and get off the Cymbalta- as we discussed, you prefer to stop the Cymbalta without a taper; I am sending in the 30 mg if you decide you feel bad just stopping the medication.   If you do decide you need the taper, take 30 mg daily x 3 days and then 30 mg every other day x 2 days;   I do not want you to take your birth control until we get your blood pressure readings down. I can see you are scheduled for next week- please keep that appointment.

## 2024-01-25 ENCOUNTER — Ambulatory Visit (INDEPENDENT_AMBULATORY_CARE_PROVIDER_SITE_OTHER): Admitting: Family

## 2024-01-25 ENCOUNTER — Encounter: Payer: Self-pay | Admitting: Family

## 2024-01-25 VITALS — BP 118/80 | HR 112 | Ht 64.0 in

## 2024-01-25 DIAGNOSIS — K219 Gastro-esophageal reflux disease without esophagitis: Secondary | ICD-10-CM | POA: Diagnosis not present

## 2024-01-25 DIAGNOSIS — R110 Nausea: Secondary | ICD-10-CM | POA: Diagnosis not present

## 2024-01-25 DIAGNOSIS — R030 Elevated blood-pressure reading, without diagnosis of hypertension: Secondary | ICD-10-CM

## 2024-01-25 MED ORDER — NORGESTIMATE-ETH ESTRADIOL 0.25-35 MG-MCG PO TABS: 1.0000 | ORAL_TABLET | Freq: Every day | ORAL | 1 refills | Status: DC

## 2024-01-25 MED ORDER — OMEPRAZOLE 40 MG PO CPDR: 40.0000 mg | DELAYED_RELEASE_CAPSULE | Freq: Every day | ORAL | 3 refills | Status: DC

## 2024-01-25 MED ORDER — ONDANSETRON 4 MG PO TBDP: 4.0000 mg | ORAL_TABLET | Freq: Three times a day (TID) | ORAL | 0 refills | Status: DC | PRN

## 2024-01-25 MED ORDER — ONDANSETRON 4 MG PO TBDP
4.0000 mg | ORAL_TABLET | Freq: Once | ORAL | Status: AC
  Administered 2024-01-25: 4 mg via ORAL

## 2024-01-25 NOTE — Progress Notes (Signed)
 Stacey Holland is a 28 y.o. female with the following history as recorded in EpicCare:  Patient Active Problem List   Diagnosis Date Noted   Fear of flying 06/09/2019   Panic anxiety syndrome 07/05/2018   Gastroparesis 04/11/2018   Anorexia nervosa    Attention deficit hyperactivity disorder (ADHD) 01/14/2018   Obstructive sleep apnea syndrome 01/14/2018   Vitamin D deficiency 01/14/2018   Attention and concentration deficit 12/01/2017   Chronic idiopathic constipation 12/01/2017   Vomiting associated with bulimia nervosa with nausea 06/22/2017   Routine general medical examination at a health care facility 08/26/2016   PCOS (polycystic ovarian syndrome) 04/03/2016   Essential hypertension 02/20/2015   GE reflux     Current Outpatient Medications  Medication Sig Dispense Refill   ALPRAZolam (XANAX) 0.5 MG tablet Take 1-2 tablets 30-45 minutes prior to flights as needed 10 tablet 0   [START ON 01/31/2024] amphetamine-dextroamphetamine (ADDERALL XR) 30 MG 24 hr capsule Take 1 capsule (30 mg total) by mouth in the morning. 30 capsule 0   amphetamine-dextroamphetamine (ADDERALL XR) 30 MG 24 hr capsule Take 1 capsule (30 mg total) by mouth daily. 30 capsule 0   amphetamine-dextroamphetamine (ADDERALL XR) 30 MG 24 hr capsule Take 1 capsule (30 mg total) by mouth daily. 30 capsule 0   amphetamine-dextroamphetamine (ADDERALL) 10 MG tablet 1 p.o. at lunch and 1 p.o. in the late afternoon. 60 tablet 0   folic acid (FOLVITE) 400 MCG tablet Take 400 mcg by mouth daily.     hydrOXYzine (ATARAX) 10 MG tablet Take 0.5-2 tablets (5-20 mg total) by mouth 3 (three) times daily as needed. 60 tablet 1   ondansetron (ZOFRAN-ODT) 4 MG disintegrating tablet Take 1 tablet (4 mg total) by mouth every 8 (eight) hours as needed for nausea or vomiting. 20 tablet 0   triamcinolone cream (KENALOG) 0.5 % Apply 1 Application topically 3 (three) times daily.     UNABLE TO FIND Take 400 mg by mouth daily. Magnesium  Glycinate     vitamin B-12 (CYANOCOBALAMIN) 50 MCG tablet Take 50 mcg by mouth daily.     ZINC GLUCONATE PO Take 100 mg by mouth daily.     norgestimate-ethinyl estradiol (ORTHO-CYCLEN) 0.25-35 MG-MCG tablet Take 1 tablet by mouth daily. 84 tablet 1   omeprazole (PRILOSEC) 40 MG capsule Take 1 capsule (40 mg total) by mouth daily. 90 capsule 3   tiZANidine (ZANAFLEX) 4 MG tablet Take 1 tablet (4 mg total) by mouth 2 (two) times daily as needed (hip pain). (Patient not taking: Reported on 01/25/2024) 10 tablet 0   No current facility-administered medications for this visit.    Allergies: Cymbalta [duloxetine hcl], Guanfacine, Nortriptyline, and Other  Past Medical History:  Diagnosis Date   Anorexia nervosa    Dyspnea    breathing heavyeven when sitting, lying dpwn,exertion   GERD (gastroesophageal reflux disease)    Headache    History of blood transfusion    prematurity, this was during perinatal period   Hypertension    Morbid obesity (HCC) 04/03/2016   PCOS (polycystic ovarian syndrome) 04/03/2016   Reflux    Syncope     Past Surgical History:  Procedure Laterality Date   CHOLECYSTECTOMY N/A 11/25/2016   Procedure: LAPAROSCOPIC CHOLECYSTECTOMY;  Surgeon: Axel Filler, MD;  Location: [facility];  Service: General;  Laterality: N/A;   ESOPHAGOGASTRODUODENOSCOPY N/A 09/26/2016   Procedure: ESOPHAGOGASTRODUODENOSCOPY (EGD);  Surgeon: Beverley Fiedler, MD;  Location: [facility];  Service: Endoscopy;  Laterality: N/A;  eustachion tubes      Family History  Problem Relation Age of Onset   GER disease Mother    Hypertension Mother    Arthritis Mother    GER disease Father    Hypertension Father    Healthy Sister    Healthy Brother    Lactose intolerance Brother    Diabetes Paternal Uncle    Hypertension Maternal Grandfather    Diabetes Maternal Grandfather    Stroke Maternal Grandfather    Dementia Maternal Grandmother    Healthy Paternal Grandmother    Cancer Neg Hx     Depression Neg Hx    Drug abuse Neg Hx    Early death Neg Hx    Heart disease Neg Hx    Hyperlipidemia Neg Hx    Kidney disease Neg Hx    Alcohol abuse Neg Hx    Asthma Neg Hx     Social History   Tobacco Use   Smoking status: Never   Smokeless tobacco: Never  Substance Use Topics   Alcohol use: No    Subjective:   Follow up on elevated blood pressure- did stop the Cymbalta as discussed at OV last week; patient wanted to stop the medication immediately; per patient, she only took the Amlodipine twice; has not taken since last Wednesday; is now having nausea as part of the withdrawal process with this medication;  Objective:  Vitals:   01/25/24 1514  BP: 118/80  Pulse: (!) 112  SpO2: 100%  Height: 5\' 4"  (1.626 m)    General: Well developed, well nourished, in no acute distress  Skin : Warm and dry.  Head: Normocephalic and atraumatic  Lungs: Respirations unlabored;  Neurologic: Alert and oriented; speech intact; face symmetrical; moves all extremities well; CNII-XII intact without focal deficit   Assessment:  1. Nausea   2. Elevated blood pressure reading   3. Gastroesophageal reflux disease, unspecified whether esophagitis present     Plan:   Suspect symptoms were due to Cymbalta; blood pressure has normalized since stopping the medication- she has not had to continue Amlodipine; offered taper again for Cymbalta due to nausea but patient again defers; will update Rx for Zofran as requested to help with nausea; follow up if the nausea has not resolved in the next week;  Refill updated on Omeprazole 40 mg every day;   No follow-ups on file.  No orders of the defined types were placed in this encounter.   Requested Prescriptions   Signed Prescriptions Disp Refills   omeprazole (PRILOSEC) 40 MG capsule 90 capsule 3    Sig: Take 1 capsule (40 mg total) by mouth daily.   norgestimate-ethinyl estradiol (ORTHO-CYCLEN) 0.25-35 MG-MCG tablet 84 tablet 1    Sig: Take 1  tablet by mouth daily.   ondansetron (ZOFRAN-ODT) 4 MG disintegrating tablet 20 tablet 0    Sig: Take 1 tablet (4 mg total) by mouth every 8 (eight) hours as needed for nausea or vomiting.

## 2024-02-04 ENCOUNTER — Ambulatory Visit (INDEPENDENT_AMBULATORY_CARE_PROVIDER_SITE_OTHER): Payer: 59 | Admitting: Physician Assistant

## 2024-02-04 ENCOUNTER — Encounter: Payer: Self-pay | Admitting: Physician Assistant

## 2024-02-04 VITALS — BP 137/74 | HR 79

## 2024-02-04 DIAGNOSIS — F40243 Fear of flying: Secondary | ICD-10-CM | POA: Diagnosis not present

## 2024-02-04 DIAGNOSIS — F902 Attention-deficit hyperactivity disorder, combined type: Secondary | ICD-10-CM | POA: Diagnosis not present

## 2024-02-04 DIAGNOSIS — F32A Depression, unspecified: Secondary | ICD-10-CM

## 2024-02-04 DIAGNOSIS — F411 Generalized anxiety disorder: Secondary | ICD-10-CM | POA: Diagnosis not present

## 2024-02-04 MED ORDER — PROPRANOLOL HCL 10 MG PO TABS: 10.0000 mg | ORAL_TABLET | Freq: Three times a day (TID) | ORAL | 1 refills | Status: DC | PRN

## 2024-02-04 MED ORDER — ALPRAZOLAM 0.5 MG PO TABS: 0.2500 mg | ORAL_TABLET | Freq: Two times a day (BID) | ORAL | 0 refills | Status: DC | PRN

## 2024-02-04 MED ORDER — AMPHETAMINE-DEXTROAMPHETAMINE 10 MG PO TABS: 10.0000 mg | ORAL_TABLET | Freq: Two times a day (BID) | ORAL | 0 refills | Status: DC

## 2024-02-04 MED ORDER — AMPHETAMINE-DEXTROAMPHET ER 30 MG PO CP24: 30.0000 mg | ORAL_CAPSULE | Freq: Every morning | ORAL | 0 refills | Status: DC

## 2024-02-04 MED ORDER — AMPHETAMINE-DEXTROAMPHETAMINE 10 MG PO TABS: ORAL_TABLET | ORAL | 0 refills | Status: DC

## 2024-02-04 MED ORDER — AMPHETAMINE-DEXTROAMPHET ER 30 MG PO CP24: 30.0000 mg | ORAL_CAPSULE | Freq: Every day | ORAL | 0 refills | Status: DC

## 2024-02-04 NOTE — Progress Notes (Signed)
 [facility]  Patient ID: Stacey Holland,  MRN: [id number]  PCP: Olive Bass, FNP  Date of Evaluation: 02/04/2024 Time spent:30 minutes  Chief Complaint:  Chief Complaint   ADD; Anxiety; Follow-up    HISTORY/CURRENT STATUS: HPI for routine med check.  Restarted Cymbalta at the last visit.  She had pain in her hip, went to urgent care on 01/14/2024 and her blood pressure was elevated.  She then saw her PCP on 01/18/2024, also with elevated BP.  She had been taking ibuprofen the few weeks before going to urgent care for the hip pain.  She wanted to stop the Cymbalta cold [place] since she had not been on it for very long.  She had no withdrawals.  She has been checking her blood pressure at home and it has been within normal limits.  She has been on Adderall for quite some time without known elevated BP.  She denied headache, dizziness, fainting, nausea or vomiting.   She is not interested in starting another antidepressant.  She does still have anxiety, mostly social anxiety.  She feels panicky, had to withdraw from school this semester because of it.  States she will just "push through" if she has to but is afraid of trying something that she has to take every day.  Not as depressed since she is no longer having to go to class.  Her energy and motivation are still a bit low however.  She does the things around the house that need to be done.  Personal hygiene is normal.  Appetite is normal and weight is stable.  She is fasting sun up to sundown for Ramadan at the moment.  No suicidal or homicidal thoughts.  States that attention is good without easy distractibility.  Able to focus on things and finish tasks to completion.   She requests a letter to give to her school saying that she had to withdraw due to extenuating circumstances, and that we were changing medications to help with anxiety and depression.  Patient denies increased energy with decreased need for sleep,  increased talkativeness, racing thoughts, impulsivity or risky behaviors, increased spending, increased libido, grandiosity, increased irritability or anger, paranoia, or hallucinations.  Denies seizures, numbness, tingling, tremor, tics, unsteady gait, slurred speech, confusion. Denies muscle or joint pain, stiffness, or dystonia.  Individual Medical History/ Review of Systems: Changes? :No   Past medications for mental health diagnoses include: Xanax for fear of flying, Klonopin, Ativan, BuSpar, nortriptyline, Celexa, Lexapro, trazodone, gabapentin, Lamictal,  guanfacine, Adderall, Cymbalta caused extremely high BP, hydroxyzine caused her to feel funny   Eating Disorder, Anorexia/bulemia about 4 years ago, went to inpatient treatment [address].  Allergies: Cymbalta [duloxetine hcl], Guanfacine, Nortriptyline, and Other  Current Medications:  Current Outpatient Medications:    [START ON 03/05/2024] amphetamine-dextroamphetamine (ADDERALL) 10 MG tablet, Take 1 tablet (10 mg total) by mouth 2 (two) times daily with a meal., Disp: 60 tablet, Rfl: 0   [START ON 04/04/2024] amphetamine-dextroamphetamine (ADDERALL) 10 MG tablet, Take 1 tablet (10 mg total) by mouth 2 (two) times daily with a meal., Disp: 60 tablet, Rfl: 0   folic acid (FOLVITE) 400 MCG tablet, Take 400 mcg by mouth daily., Disp: , Rfl:    norgestimate-ethinyl estradiol (ORTHO-CYCLEN) 0.25-35 MG-MCG tablet, Take 1 tablet by mouth daily., Disp: 84 tablet, Rfl: 1   omeprazole (PRILOSEC) 40 MG capsule, Take 1 capsule (40 mg total) by mouth daily., Disp: 90 capsule, Rfl: 3   propranolol (INDERAL) 10 MG  tablet, Take 1-2 tablets (10-20 mg total) by mouth 3 (three) times daily as needed., Disp: 60 tablet, Rfl: 1   triamcinolone cream (KENALOG) 0.5 %, Apply 1 Application topically 3 (three) times daily., Disp: , Rfl:    UNABLE TO FIND, Take 400 mg by mouth daily. Magnesium Glycinate, Disp: , Rfl:    vitamin B-12 (CYANOCOBALAMIN) 50 MCG  tablet, Take 50 mcg by mouth daily., Disp: , Rfl:    ZINC GLUCONATE PO, Take 100 mg by mouth daily., Disp: , Rfl:    ALPRAZolam (XANAX) 0.5 MG tablet, Take 0.5-1 tablets (0.25-0.5 mg total) by mouth 2 (two) times daily as needed for anxiety., Disp: 20 tablet, Rfl: 0   amphetamine-dextroamphetamine (ADDERALL XR) 30 MG 24 hr capsule, Take 1 capsule (30 mg total) by mouth in the morning., Disp: 30 capsule, Rfl: 0   [START ON 03/05/2024] amphetamine-dextroamphetamine (ADDERALL XR) 30 MG 24 hr capsule, Take 1 capsule (30 mg total) by mouth daily., Disp: 30 capsule, Rfl: 0   [START ON 04/03/2024] amphetamine-dextroamphetamine (ADDERALL XR) 30 MG 24 hr capsule, Take 1 capsule (30 mg total) by mouth daily., Disp: 30 capsule, Rfl: 0   amphetamine-dextroamphetamine (ADDERALL) 10 MG tablet, 1 p.o. at lunch and 1 p.o. in the late afternoon., Disp: 60 tablet, Rfl: 0   ondansetron (ZOFRAN-ODT) 4 MG disintegrating tablet, Take 1 tablet (4 mg total) by mouth every 8 (eight) hours as needed for nausea or vomiting. (Patient not taking: Reported on 02/04/2024), Disp: 20 tablet, Rfl: 0   tiZANidine (ZANAFLEX) 4 MG tablet, Take 1 tablet (4 mg total) by mouth 2 (two) times daily as needed (hip pain). (Patient not taking: Reported on 01/18/2024), Disp: 10 tablet, Rfl: 0 Medication Side Effects:  Possible hypertension from Cymbalta, see notes from 01/14/2024 and 01/25/2024  Family Medical/ Social History: Changes? No  MENTAL HEALTH EXAM:  Blood pressure 137/74, pulse 79, last menstrual period 12/30/2023.There is no height or weight on file to calculate BMI.  General Appearance: Casual and Well Groomed  Eye Contact:  Good  Speech:  Clear and Coherent and Normal Rate  Volume:  Normal  Mood:  Anxious  Affect:  Congruent  Thought Process:  Goal Directed and Descriptions of Associations: Circumstantial  Orientation:  Full (Time, Place, and Person)  Thought Content: Logical   Suicidal Thoughts:  No  Homicidal Thoughts:  No   Memory:  WNL  Judgement:  Good  Insight:  Good  Psychomotor Activity:  Normal  Concentration:  Concentration: Good and Attention Span: Good  Recall:  Good  Fund of Knowledge: Good  Language: Good  Assets:  Communication Skills Desire for Improvement Financial Resources/Insurance Housing Transportation Vocational/Educational  ADL's:  Intact  Cognition: WNL  Prognosis:  Good   DIAGNOSES:    ICD-10-CM   1. Generalized anxiety disorder  F41.1     2. Attention deficit hyperactivity disorder (ADHD), combined type  F90.2     3. Fear of flying  F40.243     4. Mild depression  F32.A      Receiving Psychotherapy: No   RECOMMENDATIONS:  PDMP reviewed.  Adderall XR filled 01/05/2024.  Adderall IR filled 01/03/2024. I provided 30 minutes of face to face time during this encounter, including time spent before and after the visit in records review, medical decision making, counseling pertinent to today's visit, and charting.   As far as her focus and attention go she is doing well so no changes will be made with the Adderall.  It is unclear whether  the elevated blood pressure was from Cymbalta, ibuprofen, and/or pain.  The Cymbalta and ibuprofen were discontinued and her blood pressure is back to normal.  I will list Cymbalta as contraindicated medication.  I do not think Adderall is part of the problem because she has been on it for a while without elevated blood pressures.  For the situational anxiety, I recommend adding propranolol, very low dose for as needed use.  I explained the benefits, risk and side effects and she would like to try it.  She knows to watch for dizziness or feeling faint, orthostatic hypotension, how to prevent it was explained.  I do not expect that to happen on such a low dose of propranolol.  I will dictate a letter concerning her mental health and withdrawal from classes this semester.    She request a prescription for anxiety with flying.  She has several  trips coming up, 1 to [place] in 1 to [place].  I will give her Xanax for fear of flying.  She has taken it before.  Continue Xanax 0.5 mg, 1/2-1 twice daily as needed anxiety, for flying. Continue Adderall XR  30 mg, 1 p.o. every morning. Continue Adderall 10 mg, 1/2 at lunch and 1/2 at supper prn.  OR 1 po at lunch prn.   Start propranolol 10 mg, 1-2 p.o. 3 times daily as needed anxiety. Return in 2 months.  Melony Overly, PA-C

## 2024-02-09 ENCOUNTER — Telehealth: Payer: Self-pay | Admitting: Physician Assistant

## 2024-02-09 NOTE — Telephone Encounter (Signed)
 Pt advised letter is complete and ready to pick up. Charge $10. Will pay when picked up.In [place] folder at front desk.

## 2024-02-15 ENCOUNTER — Ambulatory Visit (INDEPENDENT_AMBULATORY_CARE_PROVIDER_SITE_OTHER): Payer: 59 | Admitting: Physician Assistant

## 2024-02-21 ENCOUNTER — Telehealth: Payer: Self-pay | Admitting: Physician Assistant

## 2024-02-21 NOTE — Telephone Encounter (Signed)
 Patient called requesting refill for Adderall XR 30mg  and Adderall 10mg . States that she will going out of town later this month and pharmacy told her she could request refill for 4/26. Ph: [phone] Appt 5/29

## 2024-02-23 NOTE — Telephone Encounter (Signed)
 Pt last filled Adderall XR 30 3/28. LVM to [facility], when is she leaving?

## 2024-02-23 NOTE — Telephone Encounter (Signed)
 Pt reports leaving 4/26 for 10 days.  Definitely needs Adderall 30 XR dose.

## 2024-02-28 NOTE — Telephone Encounter (Signed)
 Patient last filled Adderall XR 30 and 10 mg IR on 3/28. She reports will be going out of town 4/26, which is the 29th date, but RF were sent for fill 4/27. Is it okay to authorize fill for 4/26?

## 2024-02-28 NOTE — Telephone Encounter (Signed)
 Yes, ok to fill 4/26.

## 2024-02-29 ENCOUNTER — Telehealth: Payer: Self-pay | Admitting: Physician Assistant

## 2024-02-29 ENCOUNTER — Ambulatory Visit: Admitting: Family

## 2024-02-29 NOTE — Telephone Encounter (Signed)
 Pt called reporting out of country 4/26-5/6. Requesting Rx for 5 days of Adderall XR 30 mg #5 1/d and Adderall 10 mg #10 2/d [facility] [address] Need to pick up 4/25.

## 2024-03-02 ENCOUNTER — Other Ambulatory Visit: Payer: Self-pay

## 2024-03-02 MED ORDER — AMPHETAMINE-DEXTROAMPHET ER 30 MG PO CP24: 30.0000 mg | ORAL_CAPSULE | Freq: Every day | ORAL | 0 refills | Status: DC

## 2024-03-02 MED ORDER — AMPHETAMINE-DEXTROAMPHETAMINE 10 MG PO TABS: 10.0000 mg | ORAL_TABLET | Freq: Two times a day (BID) | ORAL | 0 refills | Status: DC

## 2024-03-02 NOTE — Telephone Encounter (Signed)
 Addressed in a different message.

## 2024-03-02 NOTE — Telephone Encounter (Signed)
 Pended Rx for both doses. Would be an early RF of one day if approved.

## 2024-04-06 ENCOUNTER — Ambulatory Visit (INDEPENDENT_AMBULATORY_CARE_PROVIDER_SITE_OTHER): Admitting: Physician Assistant

## 2024-04-06 ENCOUNTER — Encounter: Payer: Self-pay | Admitting: Physician Assistant

## 2024-04-06 DIAGNOSIS — F411 Generalized anxiety disorder: Secondary | ICD-10-CM | POA: Diagnosis not present

## 2024-04-06 DIAGNOSIS — G4700 Insomnia, unspecified: Secondary | ICD-10-CM | POA: Diagnosis not present

## 2024-04-06 DIAGNOSIS — F902 Attention-deficit hyperactivity disorder, combined type: Secondary | ICD-10-CM

## 2024-04-06 MED ORDER — AMPHETAMINE-DEXTROAMPHET ER 30 MG PO CP24: 30.0000 mg | ORAL_CAPSULE | Freq: Every morning | ORAL | 0 refills | Status: DC

## 2024-04-06 MED ORDER — AMPHETAMINE-DEXTROAMPHETAMINE 10 MG PO TABS: 10.0000 mg | ORAL_TABLET | Freq: Two times a day (BID) | ORAL | 0 refills | Status: DC

## 2024-04-06 MED ORDER — AMPHETAMINE-DEXTROAMPHETAMINE 10 MG PO TABS: ORAL_TABLET | ORAL | 0 refills | Status: DC

## 2024-04-06 MED ORDER — AMPHETAMINE-DEXTROAMPHET ER 30 MG PO CP24: 30.0000 mg | ORAL_CAPSULE | Freq: Every day | ORAL | 0 refills | Status: DC

## 2024-04-06 MED ORDER — TRAZODONE HCL 100 MG PO TABS: 50.0000 mg | ORAL_TABLET | Freq: Every evening | ORAL | 2 refills | Status: DC | PRN

## 2024-04-06 MED ORDER — ALPRAZOLAM 0.5 MG PO TABS: 0.2500 mg | ORAL_TABLET | Freq: Two times a day (BID) | ORAL | 0 refills | Status: DC | PRN

## 2024-04-06 NOTE — Progress Notes (Signed)
 [facility]  Patient ID: Stacey Holland,  MRN: [id number]  PCP: Adra Alanis, FNP  Date of Evaluation: 04/06/2024 Time spent:30 minutes  Chief Complaint:  Chief Complaint   Depression; ADHD; Follow-up    HISTORY/CURRENT STATUS: HPI for routine med check.  She enjoyed her travels since the LOV.  Main prob now is difficulty both going to sleep and staying asleep.  She takes ZzzQuil or another over-the-counter sleep aid which is helpful but expensive.  She asks to be be prescribed something, she has taken trazodone  in the past and it was effective.  She also requests something for anxiety.  She took Klonopin  in the past and it was effective.  Most recently she has taken Xanax  but only for flying.  There are times when she feels really overwhelmed and thinks Xanax  or Klonopin  would be helpful.  She does not usually have panic attacks but can get overwhelmed and on edge sometimes for no known reason.  States that attention is good without easy distractibility.  Able to focus on things and finish tasks to completion.   Patient is able to enjoy things.  Energy and motivation are good.  Work is going well.   No extreme sadness, tearfulness, or feelings of hopelessness.  ADLs and personal hygiene are normal.    Appetite has not changed.  Weight is stable.   Denies suicidal or homicidal thoughts.  Patient denies increased energy with decreased need for sleep, increased talkativeness, racing thoughts, impulsivity or risky behaviors, increased spending, increased libido, grandiosity, increased irritability or anger, paranoia, or hallucinations.  Denies dizziness, syncope, seizures, numbness, tingling, tremor, tics, unsteady gait, slurred speech, confusion. Denies muscle or joint pain, stiffness, or dystonia.  Individual Medical History/ Review of Systems: Changes? :No   Past medications for mental health diagnoses include: Xanax  for fear of flying, Klonopin , Ativan , BuSpar ,  nortriptyline , Celexa , Lexapro, trazodone , gabapentin, Lamictal,  guanfacine, Adderall, Cymbalta  caused extremely high BP, hydroxyzine  caused her to feel funny   Eating Disorder, Anorexia/bulemia about 4 years ago, went to inpatient treatment [address].  Allergies: Cymbalta  [duloxetine  hcl], Guanfacine, Nortriptyline , and Other  Current Medications:  Current Outpatient Medications:    amphetamine -dextroamphetamine  (ADDERALL XR) 30 MG 24 hr capsule, Take 1 capsule (30 mg total) by mouth daily., Disp: 30 capsule, Rfl: 0   amphetamine -dextroamphetamine  (ADDERALL) 10 MG tablet, Take 1 tablet (10 mg total) by mouth 2 (two) times daily with a meal., Disp: 60 tablet, Rfl: 0   folic acid  (FOLVITE ) 400 MCG tablet, Take 400 mcg by mouth daily., Disp: , Rfl:    norgestimate -ethinyl estradiol  (ORTHO-CYCLEN) 0.25-35 MG-MCG tablet, Take 1 tablet by mouth daily., Disp: 84 tablet, Rfl: 1   omeprazole  (PRILOSEC) 40 MG capsule, Take 1 capsule (40 mg total) by mouth daily., Disp: 90 capsule, Rfl: 3   traZODone  (DESYREL ) 100 MG tablet, Take 0.5-1 tablets (50-100 mg total) by mouth at bedtime as needed for sleep., Disp: 30 tablet, Rfl: 2   vitamin B-12 (CYANOCOBALAMIN ) 50 MCG tablet, Take 50 mcg by mouth daily., Disp: , Rfl:    ALPRAZolam  (XANAX ) 0.5 MG tablet, Take 0.5-1 tablets (0.25-0.5 mg total) by mouth 2 (two) times daily as needed for anxiety. Take sparingly., Disp: 20 tablet, Rfl: 0   [START ON 05/13/2024] amphetamine -dextroamphetamine  (ADDERALL XR) 30 MG 24 hr capsule, Take 1 capsule (30 mg total) by mouth daily., Disp: 30 capsule, Rfl: 0   [START ON 06/12/2024] amphetamine -dextroamphetamine  (ADDERALL XR) 30 MG 24 hr capsule, Take 1 capsule (30 mg  total) by mouth in the morning., Disp: 30 capsule, Rfl: 0   [START ON 06/12/2024] amphetamine -dextroamphetamine  (ADDERALL) 10 MG tablet, 1 p.o. at lunch and 1 p.o. in the late afternoon., Disp: 60 tablet, Rfl: 0   [START ON 05/13/2024] amphetamine -dextroamphetamine   (ADDERALL) 10 MG tablet, Take 1 tablet (10 mg total) by mouth 2 (two) times daily with a meal., Disp: 60 tablet, Rfl: 0   propranolol  (INDERAL ) 10 MG tablet, Take 1-2 tablets (10-20 mg total) by mouth 3 (three) times daily as needed. (Patient not taking: Reported on 04/06/2024), Disp: 60 tablet, Rfl: 1   triamcinolone  cream (KENALOG ) 0.5 %, Apply 1 Application topically 3 (three) times daily., Disp: , Rfl:    UNABLE TO FIND, Take 400 mg by mouth daily. Magnesium  Glycinate (Patient not taking: Reported on 04/06/2024), Disp: , Rfl:    ZINC  GLUCONATE PO, Take 100 mg by mouth daily. (Patient not taking: Reported on 04/06/2024), Disp: , Rfl:  Medication Side Effects: Possible hypertension from Cymbalta , see notes from 01/14/2024 and 01/25/2024  Family Medical/ Social History: Changes? No  MENTAL HEALTH EXAM:  There were no vitals taken for this visit.There is no height or weight on file to calculate BMI.  General Appearance: Casual and Well Groomed  Eye Contact:  Good  Speech:  Clear and Coherent and Normal Rate  Volume:  Normal  Mood:  Euthymic  Affect:  Congruent  Thought Process:  Goal Directed and Descriptions of Associations: Circumstantial  Orientation:  Full (Time, Place, and Person)  Thought Content: Logical   Suicidal Thoughts:  No  Homicidal Thoughts:  No  Memory:  WNL  Judgement:  Good  Insight:  Good  Psychomotor Activity:  Normal  Concentration:  Concentration: Good and Attention Span: Good  Recall:  Good  Fund of Knowledge: Good  Language: Good  Assets:  Communication Skills Desire for Improvement Financial Resources/Insurance Housing Transportation Vocational/Educational  ADL's:  Intact  Cognition: WNL  Prognosis:  Good   DIAGNOSES:    ICD-10-CM   1. Attention deficit hyperactivity disorder (ADHD), combined type  F90.2     2. Generalized anxiety disorder  F41.1     3. Insomnia, unspecified type  G47.00       Receiving Psychotherapy: No   RECOMMENDATIONS:   PDMP reviewed.  Adderall filled 03/15/2024.  Xanax  filled 02/04/2024. I provided 20 minutes of face to face time during this encounter, including time spent before and after the visit in records review, medical decision making, counseling pertinent to today's visit, and charting.   We discussed the anxiety.  Taking Xanax  on rare occasions is okay but she should not take it often.  I explained the reasoning because she is on a stimulant, she does not need to take them close together or neither 1 may be as effective as needed.  She understands to take sparingly.  Sleep hygiene discussed.  Continue Xanax  0.5 mg, 1/2-1 twice daily as needed anxiety, take sparingly. Continue Adderall XR  30 mg, 1 p.o. every morning. Continue Adderall 10 mg, 1/2 at lunch and 1/2 at supper prn.  OR 1 po at lunch prn.   Continue propranolol  10 mg, 1-2 p.o. 3 times daily as needed anxiety.  She rarely takes. Restart Trazodone  100 mg, 1/2-1 po at bedtime prn.  Return in 3 months.  Marvia Slocumb, PA-C

## 2024-04-11 ENCOUNTER — Ambulatory Visit (INDEPENDENT_AMBULATORY_CARE_PROVIDER_SITE_OTHER): Admitting: Family

## 2024-04-11 VITALS — BP 124/72 | HR 113 | Ht 64.0 in | Wt 171.8 lb

## 2024-04-11 DIAGNOSIS — M25531 Pain in right wrist: Secondary | ICD-10-CM

## 2024-04-11 MED ORDER — MELOXICAM 15 MG PO TABS: 15.0000 mg | ORAL_TABLET | Freq: Every day | ORAL | 0 refills | Status: DC

## 2024-04-11 NOTE — Progress Notes (Signed)
 Stacey Holland is a 28 y.o. female with the following history as recorded in [employer]:  Patient Active Problem List   Diagnosis Date Noted   Fear of flying 06/09/2019   Panic anxiety syndrome 07/05/2018   Gastroparesis 04/11/2018   Anorexia nervosa    Attention deficit hyperactivity disorder (ADHD) 01/14/2018   Obstructive sleep apnea syndrome 01/14/2018   Vitamin D  deficiency 01/14/2018   Attention and concentration deficit 12/01/2017   Chronic idiopathic constipation 12/01/2017   Vomiting associated with bulimia nervosa with nausea 06/22/2017   Routine general medical examination at a health care facility 08/26/2016   PCOS (polycystic ovarian syndrome) 04/03/2016   Essential hypertension 02/20/2015   GE reflux     Current Outpatient Medications  Medication Sig Dispense Refill   ALPRAZolam  (XANAX ) 0.5 MG tablet Take 0.5-1 tablets (0.25-0.5 mg total) by mouth 2 (two) times daily as needed for anxiety. Take sparingly. 20 tablet 0   [START ON 05/13/2024] amphetamine -dextroamphetamine  (ADDERALL XR) 30 MG 24 hr capsule Take 1 capsule (30 mg total) by mouth daily. 30 capsule 0   [START ON 05/13/2024] amphetamine -dextroamphetamine  (ADDERALL) 10 MG tablet Take 1 tablet (10 mg total) by mouth 2 (two) times daily with a meal. 60 tablet 0   folic acid  (FOLVITE ) 400 MCG tablet Take 400 mcg by mouth daily.     meloxicam (MOBIC) 15 MG tablet Take 1 tablet (15 mg total) by mouth daily. 30 tablet 0   norgestimate -ethinyl estradiol  (ORTHO-CYCLEN) 0.25-35 MG-MCG tablet Take 1 tablet by mouth daily. 84 tablet 1   omeprazole  (PRILOSEC) 40 MG capsule Take 1 capsule (40 mg total) by mouth daily. 90 capsule 3   propranolol  (INDERAL ) 10 MG tablet Take 1-2 tablets (10-20 mg total) by mouth 3 (three) times daily as needed. 60 tablet 1   traZODone  (DESYREL ) 100 MG tablet Take 0.5-1 tablets (50-100 mg total) by mouth at bedtime as needed for sleep. 30 tablet 2   triamcinolone  cream (KENALOG ) 0.5 % Apply 1  Application topically 3 (three) times daily.     vitamin B-12 (CYANOCOBALAMIN ) 50 MCG tablet Take 50 mcg by mouth daily.     amphetamine -dextroamphetamine  (ADDERALL XR) 30 MG 24 hr capsule Take 1 capsule (30 mg total) by mouth daily. (Patient not taking: Reported on 04/11/2024) 30 capsule 0   [START ON 06/12/2024] amphetamine -dextroamphetamine  (ADDERALL XR) 30 MG 24 hr capsule Take 1 capsule (30 mg total) by mouth in the morning. (Patient not taking: Reported on 04/11/2024) 30 capsule 0   amphetamine -dextroamphetamine  (ADDERALL) 10 MG tablet Take 1 tablet (10 mg total) by mouth 2 (two) times daily with a meal. (Patient not taking: Reported on 04/11/2024) 60 tablet 0   [START ON 06/12/2024] amphetamine -dextroamphetamine  (ADDERALL) 10 MG tablet 1 p.o. at lunch and 1 p.o. in the late afternoon. (Patient not taking: Reported on 04/11/2024) 60 tablet 0   UNABLE TO FIND Take 400 mg by mouth daily. Magnesium  Glycinate (Patient not taking: Reported on 04/11/2024)     ZINC  GLUCONATE PO Take 100 mg by mouth daily. (Patient not taking: Reported on 04/11/2024)     No current facility-administered medications for this visit.    Allergies: Cymbalta  [duloxetine  hcl], Guanfacine, Nortriptyline , and Other  Past Medical History:  Diagnosis Date   Anorexia nervosa    Dyspnea    breathing heavyeven when sitting, lying dpwn,exertion   GERD (gastroesophageal reflux disease)    Headache    History of blood transfusion    prematurity, this was during perinatal period   Hypertension  Morbid obesity (HCC) 04/03/2016   PCOS (polycystic ovarian syndrome) 04/03/2016   Reflux    Syncope     Past Surgical History:  Procedure Laterality Date   CHOLECYSTECTOMY N/A 11/25/2016   Procedure: LAPAROSCOPIC CHOLECYSTECTOMY;  Surgeon: Shela Derby, MD;  Location: [facility] OR;  Service: General;  Laterality: N/A;   ESOPHAGOGASTRODUODENOSCOPY N/A 09/26/2016   Procedure: ESOPHAGOGASTRODUODENOSCOPY (EGD);  Surgeon: Nannette Babe, MD;  Location: [facility];  Service: Endoscopy;  Laterality: N/A;   eustachion tubes      Family History  Problem Relation Age of Onset   GER disease Mother    Hypertension Mother    Arthritis Mother    GER disease Father    Hypertension Father    Healthy Sister    Healthy Brother    Lactose intolerance Brother    Diabetes Paternal Uncle    Hypertension Maternal Grandfather    Diabetes Maternal Grandfather    Stroke Maternal Grandfather    Dementia Maternal Grandmother    Healthy Paternal Grandmother    Cancer Neg Hx    Depression Neg Hx    Drug abuse Neg Hx    Early death Neg Hx    Heart disease Neg Hx    Hyperlipidemia Neg Hx    Kidney disease Neg Hx    Alcohol abuse Neg Hx    Asthma Neg Hx     Social History   Tobacco Use   Smoking status: Never   Smokeless tobacco: Never  Substance Use Topics   Alcohol use: No    Subjective:   Right wrist pain x 3 weeks; feels like due to overuse- feels like "popping out of place"- having to take Ibuprofen  regularly; no known injury or trauma; no swelling noted; has been using OTC splint to help with stability; has been doing increased lifting with her 58 month old nephew who weighs about 20 pounds;   Objective:  Vitals:   04/11/24 1300  BP: 124/72  Pulse: (!) 113  SpO2: 98%  Weight: 171 lb 12.8 oz (77.9 kg)  Height: 5\' 4"  (1.626 m)    General: Well developed, well nourished, in no acute distress  Skin : Warm and dry.  Head: Normocephalic and atraumatic  Lungs: Respirations unlabored;  Musculoskeletal: No deformities; no active joint inflammation  Extremities: No edema, cyanosis, clubbing  Vessels: Symmetric bilaterally  Neurologic: Alert and oriented; speech intact; face symmetrical; moves all extremities well; CNII-XII intact without focal deficit   Assessment:  1. Right wrist pain     Plan:  Suspect De Quervain's Syndrome; will change to Mobic 15 mg; encouraged to ice and continue to use her splint; referral to orthopedist;   Follow up as needed;   No follow-ups on file.  Orders Placed This Encounter  Procedures   Ambulatory referral to Orthopedic Surgery    Referral Priority:   Routine    Referral Type:   Surgical    Referral Reason:   Specialty Services Required    Requested Specialty:   Orthopedic Surgery    Number of Visits Requested:   1    Requested Prescriptions   Signed Prescriptions Disp Refills   meloxicam (MOBIC) 15 MG tablet 30 tablet 0    Sig: Take 1 tablet (15 mg total) by mouth daily.

## 2024-04-18 ENCOUNTER — Other Ambulatory Visit: Payer: Self-pay

## 2024-04-18 ENCOUNTER — Ambulatory Visit (INDEPENDENT_AMBULATORY_CARE_PROVIDER_SITE_OTHER): Admitting: Physician Assistant

## 2024-04-18 ENCOUNTER — Encounter: Payer: Self-pay | Admitting: Physician Assistant

## 2024-04-18 DIAGNOSIS — M654 Radial styloid tenosynovitis [de Quervain]: Secondary | ICD-10-CM

## 2024-04-18 DIAGNOSIS — M25531 Pain in right wrist: Secondary | ICD-10-CM | POA: Diagnosis not present

## 2024-04-18 HISTORY — DX: Radial styloid tenosynovitis (de quervain): M65.4

## 2024-04-18 MED ORDER — METHYLPREDNISOLONE ACETATE 40 MG/ML IJ SUSP
40.0000 mg | INTRAMUSCULAR | Status: AC | PRN
  Administered 2024-04-18: 40 mg

## 2024-04-18 MED ORDER — LIDOCAINE HCL 1 % IJ SOLN
1.0000 mL | INTRAMUSCULAR | Status: AC | PRN
  Administered 2024-04-18: 1 mL

## 2024-04-18 NOTE — Progress Notes (Signed)
 Office Visit Note   Patient: Stacey Holland           Date of Birth: Nov 20, 1995           MRN: [id number] Visit Date: 04/18/2024              Requested by: Adra Alanis, FNP [address] [place],  [place] [address] PCP: Adra Alanis, FNP   Assessment & Plan: Visit Diagnoses:  1. Pain in right wrist   2. Tendinitis, de Quervain's     Plan: Pleasant right-hand-dominant 28 year old woman with history of pain at the base of her thumb and into her wrist.  Denies any injury.  She does do quite a few things with her hands she has wearing a brace which helps a little bit.  Exam today was indicative of de Quervain's tenosynovitis we will go forward with an injection can call me if she does not improve  Follow-Up Instructions: Return if symptoms worsen or fail to improve.   Orders:  Orders Placed This Encounter  Procedures  . XR Wrist Complete Right   No orders of the defined types were placed in this encounter.     Procedures: Hand/UE Inj: R extensor compartment 1 for de Quervain's tenosynovitis on 04/18/2024 3:40 PM Details: 25 G needle, volar approach Medications: 1 mL lidocaine  1 %; 40 mg methylPREDNISolone acetate 40 MG/ML     Clinical Data: No additional findings.   Subjective: No chief complaint on file.   HPI Pleasant right-hand-dominant woman comes in today with history of right wrist pain she is right-hand dominant no injury.  She does use her hands quite a bit.  Denies any fever chills is wearing a brace Review of Systems  All other systems reviewed and are negative.    Objective: Vital Signs: LMP 04/11/2024   Physical Exam Constitutional:      Appearance: Normal appearance.  Pulmonary:     Effort: Pulmonary effort is normal.  Skin:    General: Skin is warm and dry.  Neurological:     General: No focal deficit present.     Mental Status: She is alert.  Psychiatric:        Mood and Affect: Mood normal.         Behavior: Behavior normal.    Ortho Exam  Specialty Comments:  No specialty comments available.  Imaging: No results found.   PMFS History: Patient Active Problem List   Diagnosis Date Noted  . Tendinitis, de Quervain's 04/18/2024  . Fear of flying 06/09/2019  . Panic anxiety syndrome 07/05/2018  . Gastroparesis 04/11/2018  . Anorexia nervosa   . Attention deficit hyperactivity disorder (ADHD) 01/14/2018  . Obstructive sleep apnea syndrome 01/14/2018  . Vitamin D  deficiency 01/14/2018  . Attention and concentration deficit 12/01/2017  . Chronic idiopathic constipation 12/01/2017  . Vomiting associated with bulimia nervosa with nausea 06/22/2017  . Routine general medical examination at a health care facility 08/26/2016  . PCOS (polycystic ovarian syndrome) 04/03/2016  . Essential hypertension 02/20/2015  . GE reflux    Past Medical History:  Diagnosis Date  . Anorexia nervosa   . Dyspnea    breathing heavyeven when sitting, lying dpwn,exertion  . GERD (gastroesophageal reflux disease)   . Headache   . History of blood transfusion    prematurity, this was during perinatal period  . Hypertension   . Morbid obesity (HCC) 04/03/2016  . PCOS (polycystic ovarian syndrome) 04/03/2016  . Reflux   .  Syncope     Family History  Problem Relation Age of Onset  . GER disease Mother   . Hypertension Mother   . Arthritis Mother   . GER disease Father   . Hypertension Father   . Healthy Sister   . Healthy Brother   . Lactose intolerance Brother   . Diabetes Paternal Uncle   . Hypertension Maternal Grandfather   . Diabetes Maternal Grandfather   . Stroke Maternal Grandfather   . Dementia Maternal Grandmother   . Healthy Paternal Grandmother   . Cancer Neg Hx   . Depression Neg Hx   . Drug abuse Neg Hx   . Early death Neg Hx   . Heart disease Neg Hx   . Hyperlipidemia Neg Hx   . Kidney disease Neg Hx   . Alcohol abuse Neg Hx   . Asthma Neg Hx     Past Surgical  History:  Procedure Laterality Date  . CHOLECYSTECTOMY N/A 11/25/2016   Procedure: LAPAROSCOPIC CHOLECYSTECTOMY;  Surgeon: Shela Derby, MD;  Location: [facility];  Service: General;  Laterality: N/A;  . ESOPHAGOGASTRODUODENOSCOPY N/A 09/26/2016   Procedure: ESOPHAGOGASTRODUODENOSCOPY (EGD);  Surgeon: Nannette Babe, MD;  Location: [facility];  Service: Endoscopy;  Laterality: N/A;  . eustachion tubes     Social History   Occupational History  . Occupation: Archivist  Tobacco Use  . Smoking status: Never  . Smokeless tobacco: Never  Vaping Use  . Vaping status: Former  . Quit date: 04/18/2020  Substance and Sexual Activity  . Alcohol use: No  . Drug use: No  . Sexual activity: Never    Birth control/protection: Pill, Abstinence

## 2024-04-24 ENCOUNTER — Other Ambulatory Visit: Payer: Self-pay | Admitting: Physician Assistant

## 2024-04-28 ENCOUNTER — Other Ambulatory Visit: Payer: Self-pay | Admitting: Physician Assistant

## 2024-05-06 ENCOUNTER — Encounter: Payer: Self-pay | Admitting: Family

## 2024-05-08 ENCOUNTER — Other Ambulatory Visit: Payer: Self-pay | Admitting: Family

## 2024-05-08 DIAGNOSIS — M76891 Other specified enthesopathies of right lower limb, excluding foot: Secondary | ICD-10-CM

## 2024-05-08 MED ORDER — TIZANIDINE HCL 4 MG PO TABS: 4.0000 mg | ORAL_TABLET | Freq: Two times a day (BID) | ORAL | 0 refills | Status: DC | PRN

## 2024-05-09 ENCOUNTER — Telehealth: Admitting: Family Medicine

## 2024-05-09 ENCOUNTER — Ambulatory Visit
Admission: EM | Admit: 2024-05-09 | Discharge: 2024-05-09 | Disposition: A | Discharge status: Home / Self Care | Attending: Family Medicine | Admitting: Family Medicine

## 2024-05-09 ENCOUNTER — Other Ambulatory Visit: Payer: Self-pay | Admitting: Family

## 2024-05-09 DIAGNOSIS — K0889 Other specified disorders of teeth and supporting structures: Secondary | ICD-10-CM

## 2024-05-09 DIAGNOSIS — K047 Periapical abscess without sinus: Secondary | ICD-10-CM | POA: Diagnosis not present

## 2024-05-09 MED ORDER — IBUPROFEN 600 MG PO TABS: 600.0000 mg | ORAL_TABLET | Freq: Three times a day (TID) | ORAL | 0 refills | Status: DC | PRN

## 2024-05-09 MED ORDER — AMOXICILLIN-POT CLAVULANATE 875-125 MG PO TABS: 1.0000 | ORAL_TABLET | Freq: Two times a day (BID) | ORAL | 0 refills | Status: AC

## 2024-05-09 MED ORDER — KETOROLAC TROMETHAMINE 10 MG PO TABS: 10.0000 mg | ORAL_TABLET | Freq: Four times a day (QID) | ORAL | 0 refills | Status: DC | PRN

## 2024-05-09 MED ORDER — KETOROLAC TROMETHAMINE 30 MG/ML IJ SOLN
30.0000 mg | Freq: Once | INTRAMUSCULAR | Status: AC
  Administered 2024-05-09: 30 mg via INTRAMUSCULAR

## 2024-05-09 MED ORDER — AMOXICILLIN 500 MG PO CAPS: 500.0000 mg | ORAL_CAPSULE | Freq: Three times a day (TID) | ORAL | 0 refills | Status: DC

## 2024-05-09 NOTE — ED Provider Notes (Signed)
 [name]    CSN: [id number] Arrival date & time: 05/09/24  1519      History   Chief Complaint Chief Complaint  Patient presents with   Dental Pain    HPI Stacey Holland is a 28 y.o. female.    Dental Pain Here for dental pain.  It started bothering her yesterday.  No fever or chills  It hurts on her left posterior lower dental ridge.  She has been taking ibuprofen  and is not helping.  She had a virtual visit and was prescribed ibuprofen  and amoxicillin.  She comes in here today because she is pretty sure the ibuprofen  will not be helping since it already has not been helping her at home.  She is allergic to Cymbalta , guanfacine, nortriptyline .  Past Medical History:  Diagnosis Date   Anorexia nervosa    Anorexia nervosa    Chronic idiopathic constipation 12/01/2017   Dyspnea    breathing heavyeven when sitting, lying dpwn,exertion   Essential hypertension 02/20/2015   GERD (gastroesophageal reflux disease)    Headache    History of blood transfusion    prematurity, this was during perinatal period   Hypertension    Morbid obesity (HCC) 04/03/2016   Obstructive sleep apnea syndrome 01/14/2018   PCOS (polycystic ovarian syndrome) 04/03/2016   Reflux    Syncope    Tendinitis, de Quervain's 04/18/2024   Vomiting associated with bulimia nervosa with nausea 06/22/2017    Patient Active Problem List   Diagnosis Date Noted   Chronic idiopathic constipation 07/03/2020   Fear of flying 06/09/2019   Panic anxiety syndrome 07/05/2018   Mixed anxiety and depressive disorder 04/27/2018   Gastroparesis 04/11/2018   Attention deficit hyperactivity disorder (ADHD) 01/14/2018   Vitamin D  deficiency 01/14/2018   Gastroesophageal reflux disease without esophagitis 01/14/2018   Eating disorder 01/14/2018   Attention and concentration deficit 12/01/2017   Bulimia 12/01/2017   Routine general medical examination at a health care facility 08/26/2016    Polycystic ovaries 04/03/2016   GE reflux    Polycystic ovarian syndrome 12/19/2012    Past Surgical History:  Procedure Laterality Date   CHOLECYSTECTOMY N/A 11/25/2016   Procedure: LAPAROSCOPIC CHOLECYSTECTOMY;  Surgeon: Lynda Leos, MD;  Location[facility] OR;  Service: General;  Laterality: N/A;   ESOPHAGOGASTRODUODENOSCOPY N/A 09/26/2016   Procedure: ESOPHAGOGASTRODUODENOSCOPY (EGD);  Surgeon: Gordy CHRISTELLA Starch, MD;  Location: [facility];  Service: Endoscopy;  Laterality: N/A;   eustachion tubes      OB History   No obstetric history on file.      Home Medications    Prior to Admission medications   Medication Sig Start Date End Date Taking? Authorizing Provider  ALPRAZolam  (XANAX ) 1 MG tablet Take 1 mg by mouth 3 (three) times daily. 06/09/19  Yes [provider]  amoxicillin-clavulanate (AUGMENTIN) 875-125 MG tablet Take 1 tablet by mouth 2 (two) times daily for 7 days. 05/09/24 05/16/24 Yes Christofer Shen K, MD  cetirizine (ZYRTEC) 10 MG tablet Take 10 mg by mouth daily. 06/21/20  Yes [provider]  fluticasone  (FLONASE ) 50 MCG/ACT nasal spray Place 1 spray into both nostrils daily. 06/21/20  Yes [provider]  ketorolac  (TORADOL ) 10 MG tablet Take 1 tablet (10 mg total) by mouth every 6 (six) hours as needed (pain). 05/09/24  Yes Vonna Sharlet POUR, MD  pantoprazole  (PROTONIX ) 40 MG tablet Take 40 mg by mouth daily. 03/23/24  Yes [provider]  Plecanatide (TRULANCE) 3 MG TABS Take 3 mg  by mouth daily at 2 PM. 06/25/20  Yes [provider]  rizatriptan  (MAXALT ) 10 MG tablet Take 10 mg by mouth as needed. 04/23/20  Yes [provider]  tiZANidine  (ZANAFLEX ) 4 MG tablet Take 1 tablet (4 mg total) by mouth 2 (two) times daily as needed (hip pain). 05/08/24  Yes Jason Leita Repine, FNP  traZODone  (DESYREL ) 100 MG tablet TAKE 1/2 TO 1 TABLET (50-100 MG TOTAL) BY MOUTH AT BEDTIME AS NEEDED FOR SLEEP 04/30/24   Rhys Boyer T, PA-C   ALPRAZolam  (XANAX ) 0.5 MG tablet Take 0.5-1 tablets (0.25-0.5 mg total) by mouth 2 (two) times daily as needed for anxiety. Take sparingly. 04/06/24   Rhys Boyer T, PA-C  amphetamine -dextroamphetamine  (ADDERALL XR) 30 MG 24 hr capsule Take 1 capsule (30 mg total) by mouth daily. 05/13/24   Rhys Boyer T, PA-C  amphetamine -dextroamphetamine  (ADDERALL XR) 30 MG 24 hr capsule Take 1 capsule (30 mg total) by mouth in the morning. Patient not taking: Reported on 04/11/2024 06/12/24   Rhys Boyer T, PA-C  amphetamine -dextroamphetamine  (ADDERALL) 10 MG tablet Take 1 tablet (10 mg total) by mouth 2 (two) times daily with a meal. Patient not taking: Reported on 04/11/2024 04/04/24   Rhys Boyer T, PA-C  amphetamine -dextroamphetamine  (ADDERALL) 10 MG tablet 1 p.o. at lunch and 1 p.o. in the late afternoon. Patient not taking: Reported on 04/11/2024 06/12/24   Rhys Boyer T, PA-C  amphetamine -dextroamphetamine  (ADDERALL) 10 MG tablet Take 1 tablet (10 mg total) by mouth 2 (two) times daily with a meal. 05/13/24   Rhys, Boyer T, PA-C  norgestimate -ethinyl estradiol  (ORTHO-CYCLEN) 0.25-35 MG-MCG tablet Take 1 tablet by mouth daily. 01/25/24   Jason Leita Repine, FNP  triamcinolone  cream (KENALOG ) 0.5 % Apply 1 Application topically 3 (three) times daily.    [provider]    Family History Family History  Problem Relation Age of Onset   GER disease Mother    Hypertension Mother    Arthritis Mother    GER disease Father    Hypertension Father    Healthy Sister    Healthy Brother    Lactose intolerance Brother    Diabetes Paternal Uncle    Hypertension Maternal Grandfather    Diabetes Maternal Grandfather    Stroke Maternal Grandfather    Dementia Maternal Grandmother    Healthy Paternal Grandmother    Cancer Neg Hx    Depression Neg Hx    Drug abuse Neg Hx    Early death Neg Hx    Heart disease Neg Hx    Hyperlipidemia Neg Hx    Kidney disease Neg Hx    Alcohol abuse Neg Hx     Asthma Neg Hx     Social History Social History   Tobacco Use   Smoking status: Never   Smokeless tobacco: Never  Vaping Use   Vaping status: Former   Quit date: 04/18/2020   Substances: Nicotine  Substance Use Topics   Alcohol use: No   Drug use: No     Allergies   Cymbalta  [duloxetine  hcl], Guanfacine, Nortriptyline , Other, Pineapple, and Pineapple extract   Review of Systems Review of Systems   Physical Exam Triage Vital Signs ED Triage Vitals  Encounter Vitals Group     BP 05/09/24 1532 118/77     Girls Systolic BP Percentile --      Girls Diastolic BP Percentile --      Boys Systolic BP Percentile --      Boys Diastolic BP Percentile --  Pulse Rate 05/09/24 1532 86     Resp 05/09/24 1532 18     Temp 05/09/24 1532 98 F (36.7 C)     Temp Source 05/09/24 1532 Oral     SpO2 05/09/24 1532 99 %     Weight 05/09/24 1529 165 lb (74.8 kg)     Height 05/09/24 1529 5' 4 (1.626 m)     Head Circumference --      Peak Flow --      Pain Score 05/09/24 1527 8     Pain Loc --      Pain Education --      Exclude from Growth Chart --    No data found.  Updated Vital Signs BP 118/77 (BP Location: Right Arm)   Pulse 86   Temp 98 F (36.7 C) (Oral)   Resp 18   Ht 5' 4 (1.626 m)   Wt 74.8 kg   LMP 04/11/2024   SpO2 99%   BMI 28.32 kg/m   Visual Acuity Right Eye Distance:   Left Eye Distance:   Bilateral Distance:    Right Eye Near:   Left Eye Near:    Bilateral Near:     Physical Exam Vitals reviewed.  Constitutional:      General: She is not in acute distress.    Appearance: She is not ill-appearing, toxic-appearing or diaphoretic.  HENT:     Mouth/Throat:     Mouth: Mucous membranes are moist.     Comments: There is some mild swelling around the left lower posterior molar.  No discharge  Neurological:     Mental Status: She is alert.      UC Treatments / Results  Labs (all labs ordered are listed, but only abnormal results are  displayed) Labs Reviewed - No data to display  EKG   Radiology No results found.  Procedures Procedures (including critical care time)  Medications Ordered in UC Medications  ketorolac  (TORADOL ) 30 MG/ML injection 30 mg (has no administration in time range)    Initial Impression / Assessment and Plan / UC Course  I have reviewed the triage vital signs and the nursing notes.  Pertinent labs & imaging results that were available during my care of the patient were reviewed by me and considered in my medical decision making (see chart for details).     Toradol  was given here for her pain and Toradol  tablets are sent to the pharmacy and I will cancel the ibuprofen  prescription.  I am also going to go ahead and send in Augmentin instead of the plain amoxicillin.  She has an appointment with a dentist in 2 days. Final Clinical Impressions(s) / UC Diagnoses   Final diagnoses:  Dental infection     Discharge Instructions      You have been given a shot of Toradol  30 mg today.  Ketorolac  10 mg tablets--take 1 tablet every 6 hours as needed for pain.  This is the same medicine that is in the shot we just gave you  Take amoxicillin-clavulanate 875 mg--1 tab twice daily with food for 7 days; this is an antibiotic  I am glad you are going to see a dentist soon.       ED Prescriptions     Medication Sig Dispense Auth. Provider   ketorolac  (TORADOL ) 10 MG tablet Take 1 tablet (10 mg total) by mouth every 6 (six) hours as needed (pain). 20 tablet Nabeel Gladson K, MD   amoxicillin-clavulanate (AUGMENTIN) 875-125 MG tablet  Take 1 tablet by mouth 2 (two) times daily for 7 days. 14 tablet Gladiola Madore K, MD      PDMP not reviewed this encounter.   Vonna Sharlet POUR, MD 05/09/24 [phone]

## 2024-05-09 NOTE — ED Triage Notes (Signed)
 My tooth is hurting on the bottom left side of my mouth, I had a previous filling on this tooth, I did make a dental appointment for Thursday this week. No fever.

## 2024-05-09 NOTE — Progress Notes (Signed)
 Virtual Visit Consent   Stacey Holland, you are scheduled for a virtual visit with a [facility] provider today. Just as with appointments in the office, your consent must be obtained to participate. Your consent will be active for this visit and any virtual visit you may have with one of our providers in the next 365 days. If you have a MyChart account, a copy of this consent can be sent to you electronically.  As this is a virtual visit, video technology does not allow for your provider to perform a traditional examination. This may limit your provider's ability to fully assess your condition. If your provider identifies any concerns that need to be evaluated in person or the need to arrange testing (such as labs, EKG, etc.), we will make arrangements to do so. Although advances in technology are sophisticated, we cannot ensure that it will always work on either your end or our end. If the connection with a video visit is poor, the visit may have to be switched to a telephone visit. With either a video or telephone visit, we are not always able to ensure that we have a secure connection.  By engaging in this virtual visit, you consent to the provision of healthcare and authorize for your insurance to be billed (if applicable) for the services provided during this visit. Depending on your insurance coverage, you may receive a charge related to this service.  I need to obtain your verbal consent now. Are you willing to proceed with your visit today? Stacey Holland has provided verbal consent on 05/09/2024 for a virtual visit (video or telephone). Stacey CHRISTELLA Barefoot, NP  Date: 05/09/2024 2:47 PM   Virtual Visit via Video Note   I, Stacey Holland, connected with  Stacey Holland  (28) on 05/09/24 at  2:45 PM EDT by a video-enabled telemedicine application and verified that I am speaking with the correct person using two identifiers.  Location: Patient: Virtual Visit Location Patient:  Home Provider: Virtual Visit Location Provider: Home Office   I discussed the limitations of evaluation and management by telemedicine and the availability of in person appointments. The patient expressed understanding and agreed to proceed.    History of Present Illness: Stacey Holland is a 28 y.o. who identifies as a female who was assigned female at birth, and is being seen today for broken tooth  Onset was yesterday with a tooth filling that came out Associated symptoms are jaw pain, with some shooting pain into ear Modifying factors are ibuprofen   Denies chest pain, shortness of breath, fevers, chills, swelling, trouble with talking, chewing or opening mouth  Has a dental appt on Thursday at 2pm   Problems:  Patient Active Problem List   Diagnosis Date Noted   Tendinitis, de Quervain's 04/18/2024   Fear of flying 06/09/2019   Panic anxiety syndrome 07/05/2018   Gastroparesis 04/11/2018   Anorexia nervosa    Attention deficit hyperactivity disorder (ADHD) 01/14/2018   Obstructive sleep apnea syndrome 01/14/2018   Vitamin D  deficiency 01/14/2018   Attention and concentration deficit 12/01/2017   Chronic idiopathic constipation 12/01/2017   Vomiting associated with bulimia nervosa with nausea 06/22/2017   Routine general medical examination at a health care facility 08/26/2016   PCOS (polycystic ovarian syndrome) 04/03/2016   Essential hypertension 02/20/2015   GE reflux     Allergies:  Allergies  Allergen Reactions   Cymbalta  [Duloxetine  Hcl] Other (See Comments)    Elevated blood pressure  Guanfacine Other (See Comments)    Hallucinations   Nortriptyline  Other (See Comments)    Reports hallucinations   Other Itching, Swelling, Rash and Other (See Comments)    Reaction:  Burning Pt states that she is allergic to all hair dye.     Medications:  Current Outpatient Medications:    traZODone  (DESYREL ) 100 MG tablet, TAKE 1/2 TO 1 TABLET (50-100 MG TOTAL) BY MOUTH AT  BEDTIME AS NEEDED FOR SLEEP, Disp: 90 tablet, Rfl: 0   ALPRAZolam  (XANAX ) 0.5 MG tablet, Take 0.5-1 tablets (0.25-0.5 mg total) by mouth 2 (two) times daily as needed for anxiety. Take sparingly., Disp: 20 tablet, Rfl: 0   [START ON 05/13/2024] amphetamine -dextroamphetamine  (ADDERALL XR) 30 MG 24 hr capsule, Take 1 capsule (30 mg total) by mouth daily., Disp: 30 capsule, Rfl: 0   [START ON 06/12/2024] amphetamine -dextroamphetamine  (ADDERALL XR) 30 MG 24 hr capsule, Take 1 capsule (30 mg total) by mouth in the morning. (Patient not taking: Reported on 04/11/2024), Disp: 30 capsule, Rfl: 0   amphetamine -dextroamphetamine  (ADDERALL) 10 MG tablet, Take 1 tablet (10 mg total) by mouth 2 (two) times daily with a meal. (Patient not taking: Reported on 04/11/2024), Disp: 60 tablet, Rfl: 0   [START ON 06/12/2024] amphetamine -dextroamphetamine  (ADDERALL) 10 MG tablet, 1 p.o. at lunch and 1 p.o. in the late afternoon. (Patient not taking: Reported on 04/11/2024), Disp: 60 tablet, Rfl: 0   [START ON 05/13/2024] amphetamine -dextroamphetamine  (ADDERALL) 10 MG tablet, Take 1 tablet (10 mg total) by mouth 2 (two) times daily with a meal., Disp: 60 tablet, Rfl: 0   folic acid  (FOLVITE ) 400 MCG tablet, Take 400 mcg by mouth daily., Disp: , Rfl:    meloxicam  (MOBIC ) 15 MG tablet, TAKE 1 TABLET (15 MG TOTAL) BY MOUTH DAILY., Disp: 30 tablet, Rfl: 0   norgestimate -ethinyl estradiol  (ORTHO-CYCLEN) 0.25-35 MG-MCG tablet, Take 1 tablet by mouth daily., Disp: 84 tablet, Rfl: 1   omeprazole  (PRILOSEC) 40 MG capsule, Take 1 capsule (40 mg total) by mouth daily., Disp: 90 capsule, Rfl: 3   propranolol  (INDERAL ) 10 MG tablet, Take 1-2 tablets (10-20 mg total) by mouth 3 (three) times daily as needed., Disp: 60 tablet, Rfl: 1   tiZANidine  (ZANAFLEX ) 4 MG tablet, Take 1 tablet (4 mg total) by mouth 2 (two) times daily as needed (hip pain)., Disp: 20 tablet, Rfl: 0   triamcinolone  cream (KENALOG ) 0.5 %, Apply 1 Application topically 3 (three) times  daily., Disp: , Rfl:    UNABLE TO FIND, Take 400 mg by mouth daily. Magnesium  Glycinate (Patient not taking: Reported on 04/11/2024), Disp: , Rfl:    vitamin B-12 (CYANOCOBALAMIN ) 50 MCG tablet, Take 50 mcg by mouth daily., Disp: , Rfl:    ZINC  GLUCONATE PO, Take 100 mg by mouth daily. (Patient not taking: Reported on 04/11/2024), Disp: , Rfl:   Observations/Objective: Patient is well-developed, well-nourished in no acute distress.  Resting comfortably at home.  Head is normocephalic, atraumatic.  No labored breathing.  Speech is clear and coherent with logical content.  Patient is alert and oriented at baseline.    Assessment and Plan:  1. Pain, dental (Primary)  - amoxicillin (AMOXIL) 500 MG capsule; Take 1 capsule (500 mg total) by mouth 3 (three) times daily for 10 days.  Dispense: 30 capsule; Refill: 0 - ibuprofen  (ADVIL ) 600 MG tablet; Take 1 tablet (600 mg total) by mouth every 8 (eight) hours as needed.  Dispense: 30 tablet; Refill: 0  Suspected infection with broken tooth - Amoxicillin  and ibuprofen  prescribed  - Can use ice on outside jaw/cheek for swelling - Can also take tylenol  for pain with other medications - Discussed DenTemp putty that can be used to cover a broken tooth - Schedule a follow with a dentist as soon as possible (Can contact [facility] or [facility] [[phone]] associated with [facility] if underinsured or uninsured) - Seek in person evaluation if symptoms fail to improve or if they worsen    Reviewed side effects, risks and benefits of medication.    Patient acknowledged agreement and understanding of the plan.   Past Medical, Surgical, Social History, Allergies, and Medications have been Reviewed.     Follow Up Instructions: I discussed the assessment and treatment plan with the patient. The patient was provided an opportunity to ask questions and all were answered. The patient agreed with the  plan and demonstrated an understanding of the instructions.  A copy of instructions were sent to the patient via MyChart unless otherwise noted below.    The patient was advised to call back or seek an in-person evaluation if the symptoms worsen or if the condition fails to improve as anticipated.    Stacey CHRISTELLA Barefoot, NP

## 2024-05-09 NOTE — Patient Instructions (Signed)
 Stacey JENEANE Stakes, thank you for joining [name], NP for today's virtual visit.  While this provider is not your primary care provider (PCP), if your PCP is located in our provider database this encounter information will be shared with them immediately following your visit.   A [facility] MyChart account gives you access to today's visit and all your visits, tests, and labs performed at [facility]  click here if you don't have a [facility] MyChart account or go to [email or web address]  Consent: (Patient) Stacey Holland provided verbal consent for this virtual visit at the beginning of the encounter.  Current Medications:  Current Outpatient Medications:    amoxicillin (AMOXIL) 500 MG capsule, Take 1 capsule (500 mg total) by mouth 3 (three) times daily for 10 days., Disp: 30 capsule, Rfl: 0   ibuprofen  (ADVIL ) 600 MG tablet, Take 1 tablet (600 mg total) by mouth every 8 (eight) hours as needed., Disp: 30 tablet, Rfl: 0   traZODone  (DESYREL ) 100 MG tablet, TAKE 1/2 TO 1 TABLET (50-100 MG TOTAL) BY MOUTH AT BEDTIME AS NEEDED FOR SLEEP, Disp: 90 tablet, Rfl: 0   ALPRAZolam  (XANAX ) 0.5 MG tablet, Take 0.5-1 tablets (0.25-0.5 mg total) by mouth 2 (two) times daily as needed for anxiety. Take sparingly., Disp: 20 tablet, Rfl: 0   [START ON 05/13/2024] amphetamine -dextroamphetamine  (ADDERALL XR) 30 MG 24 hr capsule, Take 1 capsule (30 mg total) by mouth daily., Disp: 30 capsule, Rfl: 0   [START ON 06/12/2024] amphetamine -dextroamphetamine  (ADDERALL XR) 30 MG 24 hr capsule, Take 1 capsule (30 mg total) by mouth in the morning. (Patient not taking: Reported on 04/11/2024), Disp: 30 capsule, Rfl: 0   amphetamine -dextroamphetamine  (ADDERALL) 10 MG tablet, Take 1 tablet (10 mg total) by mouth 2 (two) times daily with a meal. (Patient not taking: Reported on 04/11/2024), Disp: 60 tablet, Rfl: 0   [START ON 06/12/2024] amphetamine -dextroamphetamine  (ADDERALL) 10 MG tablet, 1 p.o. at lunch and  1 p.o. in the late afternoon. (Patient not taking: Reported on 04/11/2024), Disp: 60 tablet, Rfl: 0   [START ON 05/13/2024] amphetamine -dextroamphetamine  (ADDERALL) 10 MG tablet, Take 1 tablet (10 mg total) by mouth 2 (two) times daily with a meal., Disp: 60 tablet, Rfl: 0   folic acid  (FOLVITE ) 400 MCG tablet, Take 400 mcg by mouth daily., Disp: , Rfl:    meloxicam  (MOBIC ) 15 MG tablet, TAKE 1 TABLET (15 MG TOTAL) BY MOUTH DAILY., Disp: 30 tablet, Rfl: 0   norgestimate -ethinyl estradiol  (ORTHO-CYCLEN) 0.25-35 MG-MCG tablet, Take 1 tablet by mouth daily., Disp: 84 tablet, Rfl: 1   omeprazole  (PRILOSEC) 40 MG capsule, Take 1 capsule (40 mg total) by mouth daily., Disp: 90 capsule, Rfl: 3   propranolol  (INDERAL ) 10 MG tablet, Take 1-2 tablets (10-20 mg total) by mouth 3 (three) times daily as needed., Disp: 60 tablet, Rfl: 1   tiZANidine  (ZANAFLEX ) 4 MG tablet, Take 1 tablet (4 mg total) by mouth 2 (two) times daily as needed (hip pain)., Disp: 20 tablet, Rfl: 0   triamcinolone  cream (KENALOG ) 0.5 %, Apply 1 Application topically 3 (three) times daily., Disp: , Rfl:    UNABLE TO FIND, Take 400 mg by mouth daily. Magnesium  Glycinate (Patient not taking: Reported on 04/11/2024), Disp: , Rfl:    vitamin B-12 (CYANOCOBALAMIN ) 50 MCG tablet, Take 50 mcg by mouth daily., Disp: , Rfl:    ZINC  GLUCONATE PO, Take 100 mg by mouth daily. (Patient not taking: Reported on 04/11/2024), Disp: , Rfl:    Medications  ordered in this encounter:  Meds ordered this encounter  Medications   amoxicillin (AMOXIL) 500 MG capsule    Sig: Take 1 capsule (500 mg total) by mouth 3 (three) times daily for 10 days.    Dispense:  30 capsule    Refill:  0    Supervising Provider:   LAMPTEY, PHILIP O [[id number]]   ibuprofen  (ADVIL ) 600 MG tablet    Sig: Take 1 tablet (600 mg total) by mouth every 8 (eight) hours as needed.    Dispense:  30 tablet    Refill:  0    Supervising Provider:   LAMPTEY, PHILIP O [[id number]]     *If you need  refills on other medications prior to your next appointment, please contact your pharmacy*  Follow-Up: Call back or seek an in-person evaluation if the symptoms worsen or if the condition fails to improve as anticipated.  [facility] [phone]  Other Instructions   - Amoxicillin and ibuprofen  prescribed  - Can use ice on outside jaw/cheek for swelling - Can also take tylenol  for pain with other medications - Discussed DenTemp putty that can be used to cover a broken tooth - Schedule a follow with a dentist as soon as possible (Can contact [facility] or [facility] [[phone]] associated with [facility] health department if underinsured or uninsured) - Seek in person evaluation if symptoms fail to improve or if they worsen    If you have been instructed to have an in-person evaluation today at a local Urgent Care facility, please use the link below. It will take you to a list of all of our available [facility], including address, phone number and hours of operation. Please do not delay care.  [facility]  If you or a family member do not have a primary care provider, use the link below to schedule a visit and establish care. When you choose a [facility] primary care physician or advanced practice provider, you gain a long-term partner in health. Find a Primary Care Provider  Learn more about [facility]'s in-office and virtual care options: [facility] - Get Care Now

## 2024-05-09 NOTE — Discharge Instructions (Signed)
 You have been given a shot of Toradol  30 mg today.  Ketorolac  10 mg tablets--take 1 tablet every 6 hours as needed for pain.  This is the same medicine that is in the shot we just gave you  Take amoxicillin-clavulanate 875 mg--1 tab twice daily with food for 7 days; this is an antibiotic  I am glad you are going to see a dentist soon.

## 2024-05-11 ENCOUNTER — Telehealth: Payer: Self-pay | Admitting: Physician Assistant

## 2024-05-11 NOTE — Telephone Encounter (Signed)
 Patient called in for refill on Alprazolam  0.5mg . She asked if this could be increased to 1mg  as she is taking two and runs out faster. Ph: [phone] Appt 8/28 Pharmacy [facility] [address] [place]

## 2024-05-15 ENCOUNTER — Other Ambulatory Visit: Payer: Self-pay

## 2024-05-15 MED ORDER — ALPRAZOLAM 0.5 MG PO TABS: 0.5000 mg | ORAL_TABLET | Freq: Two times a day (BID) | ORAL | 0 refills | Status: DC | PRN

## 2024-05-15 NOTE — Telephone Encounter (Signed)
 No dose increase, please pend the 0.5 mg, #20, 1-2 bid prn. No RF Remind her she shouldn't take this within 4 hours of the time she takes Adderall b/c they cancel each other out, upper/downer effect.  Recommend she take the Propranolol  more often for rescue, it can help with palpitations and jitteriness caused by anxiety.

## 2024-05-15 NOTE — Telephone Encounter (Signed)
 Pt is calling in again about this request today at 11:54a

## 2024-05-15 NOTE — Telephone Encounter (Signed)
 Patient is prescribed:   Continue Xanax  0.5 mg, 1/2-1 twice daily as needed anxiety, take sparingly.   She reports taking 2 tablets at a time. She is asking if dose can be increased to 1 mg.   04/06/2024 04/06/2024 1  Alprazolam  0.5 Mg Tablet 20.00 10  Will pend as appropriate.

## 2024-05-15 NOTE — Telephone Encounter (Signed)
 Pended.

## 2024-05-16 ENCOUNTER — Telehealth: Payer: Self-pay

## 2024-05-17 ENCOUNTER — Other Ambulatory Visit (INDEPENDENT_AMBULATORY_CARE_PROVIDER_SITE_OTHER): Payer: Self-pay

## 2024-05-17 ENCOUNTER — Ambulatory Visit: Admitting: Physician Assistant

## 2024-05-17 ENCOUNTER — Encounter: Payer: Self-pay | Admitting: Physician Assistant

## 2024-05-17 DIAGNOSIS — M79604 Pain in right leg: Secondary | ICD-10-CM | POA: Diagnosis not present

## 2024-05-17 DIAGNOSIS — G8929 Other chronic pain: Secondary | ICD-10-CM

## 2024-05-17 DIAGNOSIS — M545 Low back pain, unspecified: Secondary | ICD-10-CM

## 2024-05-17 MED ORDER — METHYLPREDNISOLONE 4 MG PO TBPK: ORAL_TABLET | ORAL | 0 refills | Status: DC

## 2024-05-17 NOTE — Progress Notes (Signed)
 Office Visit Note   Patient: Stacey Holland           Date of Birth: 1996-04-24           MRN: [id number] Visit Date: 05/17/2024              Requested by: Jason Leita Repine, FNP [address] [place],  [place] [address] PCP: Jason Leita Repine, FNP   Assessment & Plan: Visit Diagnoses:  Lower back pain right  Plan: Patient is a pleasant 28 year old woman who comes in today with right posterior buttock pain.  She denies any injuries.  She said this has been going on for a couple years.  She denies any trauma.  She denies any paresthesias loss or bowel or bladder control.  Her x-ray today shows significant anterior listhesis L5-S1.  She is neurologically intact.  I do propose that she go forward with some physical therapy and follow-up with Megan once therapy is completed we will put in a referral.  May follow-up with me as needed I did give her a Medrol  Dosepak as she is somewhat uncomfortable and has not been helped with either muscle relaxants or anti-inflammatories  Follow-Up Instructions: No follow-ups on file.   Orders:  Orders Placed This Encounter  Procedures   XR Lumbar Spine 2-3 Views   No orders of the defined types were placed in this encounter.     Procedures: No procedures performed   Clinical Data: No additional findings.   Subjective: No chief complaint on file.   HPI patient is a 28 year old woman who comes in today with a chief complaint of left lateral posterior buttock pain.  Been going on for 2 years.  She says sleeping on it is painful.  She has tried muscle relaxers helped then after 2 days it stopped.  She takes ibuprofen  and Tylenol  does not help does not have a history of injuries  Review of Systems  All other systems reviewed and are negative.    Objective: Vital Signs: There were no vitals taken for this visit.  Physical Exam Constitutional:      Appearance: Normal appearance.  Pulmonary:     Effort:  Pulmonary effort is normal.  Neurological:     General: No focal deficit present.     Mental Status: She is alert and oriented to person, place, and time.  Psychiatric:        Mood and Affect: Mood normal.        Behavior: Behavior normal.     Ortho Exam Examination she has no tenderness in her groin no pain with with reproduction of internal/external rotation.  She has quite a bit of mobility.  She is nontender over the trochanteric bursa does have pain over the posterior buttock and into the lower back.  She has excellent strength with resisted dorsiflexion plantarflexion extension flexion of her legs.  Specialty Comments:  No specialty comments available.  Imaging: No results found.   PMFS History: Patient Active Problem List   Diagnosis Date Noted   Chronic idiopathic constipation 07/03/2020   Fear of flying 06/09/2019   Panic anxiety syndrome 07/05/2018   Mixed anxiety and depressive disorder 04/27/2018   Gastroparesis 04/11/2018   Attention deficit hyperactivity disorder (ADHD) 01/14/2018   Vitamin D  deficiency 01/14/2018   Gastroesophageal reflux disease without esophagitis 01/14/2018   Eating disorder 01/14/2018   Attention and concentration deficit 12/01/2017   Bulimia 12/01/2017   Routine general medical examination at a health  care facility 08/26/2016   Polycystic ovaries 04/03/2016   GE reflux    Polycystic ovarian syndrome 12/19/2012   Past Medical History:  Diagnosis Date   Anorexia nervosa    Anorexia nervosa    Chronic idiopathic constipation 12/01/2017   Dyspnea    breathing heavyeven when sitting, lying dpwn,exertion   Essential hypertension 02/20/2015   GERD (gastroesophageal reflux disease)    Headache    History of blood transfusion    prematurity, this was during perinatal period   Hypertension    Morbid obesity (HCC) 04/03/2016   Obstructive sleep apnea syndrome 01/14/2018   PCOS (polycystic ovarian syndrome) 04/03/2016   Reflux     Syncope    Tendinitis, de Quervain's 04/18/2024   Vomiting associated with bulimia nervosa with nausea 06/22/2017    Family History  Problem Relation Age of Onset   GER disease Mother    Hypertension Mother    Arthritis Mother    GER disease Father    Hypertension Father    Healthy Sister    Healthy Brother    Lactose intolerance Brother    Diabetes Paternal Uncle    Hypertension Maternal Grandfather    Diabetes Maternal Grandfather    Stroke Maternal Grandfather    Dementia Maternal Grandmother    Healthy Paternal Grandmother    Cancer Neg Hx    Depression Neg Hx    Drug abuse Neg Hx    Early death Neg Hx    Heart disease Neg Hx    Hyperlipidemia Neg Hx    Kidney disease Neg Hx    Alcohol abuse Neg Hx    Asthma Neg Hx     Past Surgical History:  Procedure Laterality Date   CHOLECYSTECTOMY N/A 11/25/2016   Procedure: LAPAROSCOPIC CHOLECYSTECTOMY;  Surgeon: Lynda Leos, MD;  Location: [facility];  Service: General;  Laterality: N/A;   ESOPHAGOGASTRODUODENOSCOPY N/A 09/26/2016   Procedure: ESOPHAGOGASTRODUODENOSCOPY (EGD);  Surgeon: Gordy CHRISTELLA Starch, MD;  Location: [facility];  Service: Endoscopy;  Laterality: N/A;   eustachion tubes     Social History   Occupational History   Occupation: Archivist  Tobacco Use   Smoking status: Never   Smokeless tobacco: Never  Vaping Use   Vaping status: Former   Quit date: 04/18/2020   Substances: Nicotine  Substance and Sexual Activity   Alcohol use: No   Drug use: No   Sexual activity: Never    Birth control/protection: Pill, Abstinence

## 2024-05-17 NOTE — Addendum Note (Signed)
 Addended by: RODGERS LACY on: 05/17/2024 03:26 PM   Modules accepted: Orders

## 2024-05-23 ENCOUNTER — Ambulatory Visit: Admitting: Physician Assistant

## 2024-05-31 ENCOUNTER — Ambulatory Visit: Admitting: Physician Assistant

## 2024-05-31 NOTE — Therapy (Signed)
 OUTPATIENT PHYSICAL THERAPY THORACOLUMBAR EVALUATION   Patient Name: Stacey Holland MRN: [id number] DOB:02/26/1996, 28 y.o., female Today's Date: 06/01/2024  END OF SESSION:  PT End of Session - 06/01/24 1421     Visit Number 1    Number of Visits 16    Date for PT Re-Evaluation 07/27/24    Authorization Type [employer], AUTH NEEDED    Progress Note Due on Visit 10    PT Start Time 1429    PT Stop Time 1503    PT Time Calculation (min) 34 min    Activity Tolerance Patient tolerated treatment well    Behavior During Therapy WFL for tasks assessed/performed          Past Medical History:  Diagnosis Date   Anorexia nervosa    Anorexia nervosa    Chronic idiopathic constipation 12/01/2017   Dyspnea    breathing heavyeven when sitting, lying dpwn,exertion   Essential hypertension 02/20/2015   GERD (gastroesophageal reflux disease)    Headache    History of blood transfusion    prematurity, this was during perinatal period   Hypertension    Morbid obesity (HCC) 04/03/2016   Obstructive sleep apnea syndrome 01/14/2018   PCOS (polycystic ovarian syndrome) 04/03/2016   Reflux    Syncope    Tendinitis, de Quervain's 04/18/2024   Vomiting associated with bulimia nervosa with nausea 06/22/2017   Past Surgical History:  Procedure Laterality Date   CHOLECYSTECTOMY N/A 11/25/2016   Procedure: LAPAROSCOPIC CHOLECYSTECTOMY;  Surgeon: Lynda Leos, MD;  Location: [facility];  Service: General;  Laterality: N/A;   ESOPHAGOGASTRODUODENOSCOPY N/A 09/26/2016   Procedure: ESOPHAGOGASTRODUODENOSCOPY (EGD);  Surgeon: Gordy CHRISTELLA Starch, MD;  Location: [facility];  Service: Endoscopy;  Laterality: N/A;   eustachion tubes     Patient Active Problem List   Diagnosis Date Noted   Chronic idiopathic constipation 07/03/2020   Fear of flying 06/09/2019   Panic anxiety syndrome 07/05/2018   Mixed anxiety and depressive disorder 04/27/2018   Gastroparesis 04/11/2018   Attention  deficit hyperactivity disorder (ADHD) 01/14/2018   Vitamin D  deficiency 01/14/2018   Gastroesophageal reflux disease without esophagitis 01/14/2018   Eating disorder 01/14/2018   Attention and concentration deficit 12/01/2017   Bulimia 12/01/2017   Routine general medical examination at a health care facility 08/26/2016   Polycystic ovaries 04/03/2016   GE reflux    Polycystic ovarian syndrome 12/19/2012    PCP: Leita Eliza Elbe, FNP  REFERRING PROVIDER: Ronal Dragon Persons, PA-C  REFERRING DIAG: M54.50,M79.604 (ICD-10-CM) - Low back pain radiating to right lower extremity  Rationale for Evaluation and Treatment: Rehabilitation  THERAPY DIAG:  Other low back pain  Radiculopathy, lumbosacral region  Muscle weakness (generalized)  Other abnormalities of gait and mobility  ONSET DATE: 2017-2018  SUBJECTIVE:  SUBJECTIVE STATEMENT: My side doesn't hurt as bad, but sometimes it gets really bad where I have to sit in crazy positions.  PERTINENT HISTORY:  Patient states that her pain/discomfort began in 2017-2018 and comes and goes. However, patient notes that pain has consistently become worse and is becoming more constant. The discomfort is typically in R glute with very little discomfort/pain in lower back. It has started to affect her sleep and her occupation.    PAIN:  Are you having pain? Yes: NPRS scale: 1/10 resting at beginning of session; 7/10 at its worst  Pain location: R hip Pain description: uncomfortable, aching  Aggravating factors: walking for prolonged periods, up and downstairs, standing for prolonged periods of time  Relieving factors: bending/flexion, stretching/mobility   PRECAUTIONS: None  RED FLAGS: None   WEIGHT BEARING RESTRICTIONS: No  FALLS:  Has patient  fallen in last 6 months? 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  DIAGNOSTIC FINDINGS:  2 view radiographs of the lumbar spine demonstrate significant anterior  listhesis at L5-S1 with degenerative changes seems to have increased since  previous films in 2019   PATIENT SURVEYS:  PSFS: THE PATIENT SPECIFIC FUNCTIONAL SCALE  Place score of 0-10 (0 = unable to perform activity and 10 = able to perform activity at the same level as before injury or problem)  Activity Date: 06/01/2024    Walking for prolonged times   4    2. Going up and down stairs   4    3. Sleeping   4    4. Standing for prolonged times   4    Total Score 4      Total Score = Sum of activity scores/number of activities  Minimally Detectable Change: 3 points (for single activity); 2 points (for average score)  [facility] (nd). The Patient Specific Functional Scale . Retrieved from [email or web address]   COGNITION: Overall cognitive status: Within functional limits for tasks assessed     SENSATION: Light touch: WFL  MUSCLE LENGTH: Not tested on eval  POSTURE: rounded shoulders  PALPATION: Relief of symptoms with STM at R glute  LUMBAR ROM:   AROM Eval 06/01/2024  Flexion WFL  Extension WFL  Right lateral flexion WFL, Pressure on R side  Left lateral flexion WFL  Right rotation WFL. Pressure on R glute  Left rotation WFL   (Blank rows = not tested)  LOWER EXTREMITY ROM:     ROM Right Eval 06/01/2024 Left Eval 06/01/2024  Hip flexion [facility]  Hip extension    Hip abduction    Hip adduction    Hip internal rotation    Hip external rotation    Knee flexion    Knee extension    Ankle dorsiflexion    Ankle plantarflexion    Ankle inversion    Ankle eversion     (Blank rows = not tested)  LOWER EXTREMITY MMT:    MMT Right Eval 06/01/2024 Left Eval 06/01/2024  Hip flexion 4+/5 (seated) 4+/5 (Seated)  Hip extension 4+/5 4+/5  Hip abduction 5/5 5/5  Hip adduction  Hip internal rotation    Hip external rotation    Knee flexion 5/5 (seated) 5/5 (seated)  Knee extension 5/5 (seated) 5/5 (Seated)  Ankle dorsiflexion    Ankle plantarflexion    Ankle inversion    Ankle eversion     (Blank rows = not tested)  LUMBAR SPECIAL TESTS:  Straight leg raise test: Positive and Slump test: Positive; both are more pressure/uncomfortable; SLR with DF, slump with DF and cervical   FUNCTIONAL TESTS:  Core strength: to be assessed at next session  Lumbar endurance (superwoman): to be assessed at next session  GAIT: Distance walked: not formally assessed  Assistive device utilized: None Level of assistance: Complete Independence Comments:   TREATMENT DATE:  06/01/2024       TherEx:   HEP handout provided with patient performing one set of each exercise for appropriate biomechanics. Verbal cues required.  Discussed progression and regression of the exercises and how to adjust with days that may be more painful                                                                                                                      PATIENT EDUCATION:  Education details: HEP, progressions, regressions, how to adjust with pain Person educated: Patient Education method: Explanation, Demonstration, Verbal cues, and Handouts Education comprehension: verbalized understanding and returned demonstration  HOME EXERCISE PROGRAM: Access Code: [email or web address] URL: [email or web address] Date: 06/01/2024 Prepared by: Susannah Daring  Exercises - Supine Lower Trunk Rotation  - 1 x daily - 7 x weekly - 3 sets - 10 reps - 2-3 hold - Seated Figure 4 Piriformis Stretch  - 1 x daily - 7 x weekly - 3 sets - 45 hold - Seated Single Knee to Chest  - 1 x daily - 7 x weekly - 3 sets - 45 hold - Seated Slump Nerve Glide  - 1 x daily - 7 x weekly - 3 sets - 10 reps - Supine March  - 1 x daily - 7 x weekly - 3 sets - 10 reps  ASSESSMENT:  CLINICAL IMPRESSION: Patient is a 28 y.o. M who was seen today for physical therapy evaluation and treatment for low back pain with radiculopathy to R glute. Patient has slight strength deficits and pain levels that affect her occupation and sleep. PT will assess core and lumbar endurance and strength at next session. Patient will benefit from skilled PT to address above noted deficits.   OBJECTIVE IMPAIRMENTS: decreased activity tolerance, difficulty walking, decreased strength, postural dysfunction, and pain.   ACTIVITY LIMITATIONS: sitting, standing, and sleeping  PARTICIPATION LIMITATIONS: community activity and occupation  PERSONAL FACTORS: 3+ comorbidities: GERD, anxiety, eating disorder, HTN, OSA are also affecting patient's functional outcome.   REHAB POTENTIAL: Good  CLINICAL DECISION MAKING: Stable/uncomplicated  EVALUATION COMPLEXITY: Low   GOALS: Goals reviewed with patient? Yes  SHORT TERM GOALS: Target date: 06/22/2024  Patient will show compliance with initial HEP. Baseline: Goal status: INITIAL  2.  Patient will report  pain increasing to no greater than 4/10 during functional activities. Baseline:  Goal status: INITIAL  3. Patient will tolerate core and lumbar endurance assessment.  Baseline:   Goal status: INITIAL   LONG TERM GOALS: Target date: 07/27/2024  Patient will show compliance and independence with final HEP to maintain and progress functional gains made it PT.  Baseline:  Goal status: INITIAL  2.  Patient will report pain increasing to no  greater than 2/10 during functional activities.  Baseline:  Goal status: INITIAL  3.  Patient will increase PSFS total score to at least 6 to show significant improvement in subjective disability rating. Baseline:  Goal status: INITIAL  4.  Patient will increase hip extension strength to 5/5 to improve functional mechanics.   Baseline:  Goal status: INITIAL     PLAN:  PT FREQUENCY: 1-2x/week  PT DURATION: 8 weeks  PLANNED INTERVENTIONS: 97164- PT Re-evaluation, 97750- Physical Performance Testing, 97110-Therapeutic exercises, 97530- Therapeutic activity, [id number]- Neuromuscular re-education, 97535- Self Care, 02859- Manual therapy, [id number]- Gait training, [phone]- Electrical stimulation (unattended), [phone]- Electrical stimulation (manual), [id number]- Vasopneumatic device, [id number]- Ultrasound, [id number]- Traction (mechanical), [id number]- Ionotophoresis 4mg /ml Dexamethasone , 79439 (1-2 muscles), 20561 (3+ muscles)- Dry Needling, Patient/Family education, Balance training, Stair training, Joint mobilization, Joint manipulation, Spinal manipulation, Spinal mobilization, Cryotherapy, and Moist heat.  PLAN FOR NEXT SESSION: core strength assessment, lumbar endurance assessment, hip flexibility   Susannah Daring, PT, DPT 06/01/24 3:43 PM

## 2024-06-01 ENCOUNTER — Ambulatory Visit (INDEPENDENT_AMBULATORY_CARE_PROVIDER_SITE_OTHER)

## 2024-06-01 DIAGNOSIS — R2689 Other abnormalities of gait and mobility: Secondary | ICD-10-CM

## 2024-06-01 DIAGNOSIS — M6281 Muscle weakness (generalized): Secondary | ICD-10-CM

## 2024-06-01 DIAGNOSIS — M5459 Other low back pain: Secondary | ICD-10-CM | POA: Diagnosis not present

## 2024-06-01 DIAGNOSIS — M5417 Radiculopathy, lumbosacral region: Secondary | ICD-10-CM

## 2024-06-10 ENCOUNTER — Other Ambulatory Visit: Payer: Self-pay | Admitting: Family

## 2024-06-12 NOTE — Telephone Encounter (Signed)
 Was referred to Ortho, please review if appropriate for refills.

## 2024-06-13 ENCOUNTER — Ambulatory Visit: Admitting: Physical Medicine and Rehabilitation

## 2024-06-16 NOTE — Therapy (Incomplete)
 OUTPATIENT PHYSICAL THERAPY THORACOLUMBAR EVALUATION   Patient Name: Stacey Holland MRN: [id number] DOB:Jan 07, 1996, 28 y.o., female Today's Date: 06/16/2024  END OF SESSION:    Past Medical History:  Diagnosis Date   Anorexia nervosa    Anorexia nervosa    Chronic idiopathic constipation 12/01/2017   Dyspnea    breathing heavyeven when sitting, lying dpwn,exertion   Essential hypertension 02/20/2015   GERD (gastroesophageal reflux disease)    Headache    History of blood transfusion    prematurity, this was during perinatal period   Hypertension    Morbid obesity (HCC) 04/03/2016   Obstructive sleep apnea syndrome 01/14/2018   PCOS (polycystic ovarian syndrome) 04/03/2016   Reflux    Syncope    Tendinitis, de Quervain's 04/18/2024   Vomiting associated with bulimia nervosa with nausea 06/22/2017   Past Surgical History:  Procedure Laterality Date   CHOLECYSTECTOMY N/A 11/25/2016   Procedure: LAPAROSCOPIC CHOLECYSTECTOMY;  Surgeon: Lynda Leos, MD;  Location: [facility] OR;  Service: General;  Laterality: N/A;   ESOPHAGOGASTRODUODENOSCOPY N/A 09/26/2016   Procedure: ESOPHAGOGASTRODUODENOSCOPY (EGD);  Surgeon: Gordy CHRISTELLA Starch, MD;  Location: [facility];  Service: Endoscopy;  Laterality: N/A;   eustachion tubes     Patient Active Problem List   Diagnosis Date Noted   Chronic idiopathic constipation 07/03/2020   Fear of flying 06/09/2019   Panic anxiety syndrome 07/05/2018   Mixed anxiety and depressive disorder 04/27/2018   Gastroparesis 04/11/2018   Attention deficit hyperactivity disorder (ADHD) 01/14/2018   Vitamin D  deficiency 01/14/2018   Gastroesophageal reflux disease without esophagitis 01/14/2018   Eating disorder 01/14/2018   Attention and concentration deficit 12/01/2017   Bulimia 12/01/2017   Routine general medical examination at a health care facility 08/26/2016   Polycystic ovaries 04/03/2016   GE reflux    Polycystic ovarian syndrome 12/19/2012     PCP: Leita Eliza Elbe, FNP  REFERRING PROVIDER: Ronal Dragon Persons, PA-C  REFERRING DIAG: M54.50,M79.604 (ICD-10-CM) - Low back pain radiating to right lower extremity  Rationale for Evaluation and Treatment: Rehabilitation  THERAPY DIAG:  No diagnosis found.  ONSET DATE: 2017-2018  SUBJECTIVE:                                                                                                                                                                                           SUBJECTIVE STATEMENT: ***  My side doesn't hurt as bad, but sometimes it gets really bad where I have to sit in crazy positions.  PERTINENT HISTORY:  Patient states that her pain/discomfort began in 2017-2018 and comes and goes. However, patient notes that pain has consistently become worse and  is becoming more constant. The discomfort is typically in R glute with very little discomfort/pain in lower back. It has started to affect her sleep and her occupation.    PAIN:  Are you having pain? Yes: NPRS scale: 1/10 resting at beginning of session; 7/10 at its worst  Pain location: R hip Pain description: uncomfortable, aching  Aggravating factors: walking for prolonged periods, up and downstairs, standing for prolonged periods of time  Relieving factors: bending/flexion, stretching/mobility   PRECAUTIONS: None  RED FLAGS: None   WEIGHT BEARING RESTRICTIONS: No  FALLS:  Has patient fallen in last 6 months? 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  DIAGNOSTIC FINDINGS:  2 view radiographs of the  lumbar spine demonstrate significant anterior  listhesis at L5-S1 with degenerative changes seems to have increased since  previous films in 2019   PATIENT SURVEYS:  PSFS: THE PATIENT SPECIFIC FUNCTIONAL SCALE  Place score of 0-10 (0 = unable to perform activity and 10 = able to perform activity at the same level as before injury or problem)  Activity Date: 06/01/2024    Walking for prolonged times   4    2. Going up and down stairs   4    3. Sleeping   4    4. Standing for prolonged times   4    Total Score 4      Total Score = Sum of activity scores/number of activities  Minimally Detectable Change: 3 points (for single activity); 2 points (for average score)  [facility] (nd). The Patient Specific Functional Scale . Retrieved from [email or web address]   COGNITION: Overall cognitive status: Within functional limits for tasks assessed     SENSATION: Light touch: WFL  MUSCLE LENGTH: Not tested on eval  POSTURE: rounded shoulders  PALPATION: Relief of symptoms with STM at R glute  LUMBAR ROM:   AROM Eval 06/01/2024  Flexion WFL  Extension WFL  Right lateral flexion WFL, Pressure on R side  Left lateral flexion WFL  Right rotation WFL. Pressure on R glute  Left rotation WFL   (Blank rows = not tested)  LOWER EXTREMITY ROM:     ROM Right Eval 06/01/2024 Left Eval 06/01/2024  Hip flexion [facility]  [facility]  Hip extension    Hip abduction    Hip adduction    Hip internal rotation    Hip external rotation    Knee flexion    Knee extension    Ankle dorsiflexion    Ankle plantarflexion    Ankle inversion    Ankle eversion     (Blank rows = not tested)  LOWER EXTREMITY MMT:    MMT Right Eval 06/01/2024 Left Eval 06/01/2024  Hip flexion 4+/5 (seated) 4+/5 (Seated)  Hip extension 4+/5 4+/5  Hip abduction 5/5 5/5  Hip adduction    Hip internal rotation    Hip external rotation    Knee flexion 5/5  (seated) 5/5 (seated)  Knee extension 5/5 (seated) 5/5 (Seated)  Ankle dorsiflexion    Ankle plantarflexion    Ankle inversion    Ankle eversion     (Blank  rows = not tested)  LUMBAR SPECIAL TESTS:  Straight leg raise test: Positive and Slump test: Positive; both are more pressure/uncomfortable; SLR with DF, slump with DF and cervical   FUNCTIONAL TESTS:  Core strength: to be assessed at next session  Lumbar endurance (superwoman): to be assessed at next session  GAIT: Distance walked: not formally assessed  Assistive device utilized: None Level of assistance: Complete Independence Comments:   TREATMENT DATE:  06/19/2024 ***  06/01/2024       TherEx:   HEP handout provided with patient performing one set of each exercise for appropriate biomechanics. Verbal cues required.  Discussed progression and regression of the exercises and how to adjust with days that may be more painful                                                                                                                      PATIENT EDUCATION:  Education details: HEP, progressions, regressions, how to adjust with pain Person educated: Patient Education method: Explanation, Demonstration, Verbal cues, and Handouts Education comprehension: verbalized understanding and returned demonstration  HOME EXERCISE PROGRAM: Access Code: [email or web address] URL: [email or web address] Date: 06/01/2024 Prepared by: Susannah Daring  Exercises - Supine Lower Trunk Rotation  - 1 x daily - 7 x weekly - 3 sets - 10 reps - 2-3 hold - Seated Figure 4 Piriformis Stretch  - 1 x daily - 7 x weekly - 3 sets - 45 hold - Seated Single Knee to Chest  - 1 x daily - 7 x weekly - 3 sets - 45 hold - Seated Slump Nerve Glide  - 1 x daily - 7 x weekly - 3 sets - 10 reps - Supine March  - 1 x daily - 7 x weekly - 3 sets - 10 reps  ASSESSMENT:  CLINICAL IMPRESSION: ***  Patient is a 28 y.o. M who was seen today for physical  therapy evaluation and treatment for low back pain with radiculopathy to R glute. Patient has slight strength deficits and pain levels that affect her occupation and sleep. PT will assess core and lumbar endurance and strength at next session. Patient will benefit from skilled PT to address above noted deficits.   OBJECTIVE IMPAIRMENTS: decreased activity tolerance, difficulty walking, decreased strength, postural dysfunction, and pain.   ACTIVITY LIMITATIONS: sitting, standing, and sleeping  PARTICIPATION LIMITATIONS: community activity and occupation  PERSONAL FACTORS: 3+ comorbidities: GERD, anxiety, eating disorder, HTN, OSA are also affecting patient's functional outcome.   REHAB POTENTIAL: Good  CLINICAL DECISION MAKING: Stable/uncomplicated  EVALUATION COMPLEXITY: Low   GOALS: Goals reviewed with patient? Yes  SHORT TERM GOALS: Target date: 06/22/2024  Patient will show compliance with initial HEP. Baseline: Goal status: INITIAL  2.  Patient will report pain increasing to no greater than 4/10 during functional activities. Baseline:  Goal status: INITIAL  3. Patient will tolerate core and lumbar endurance assessment.  Baseline:   Goal status: INITIAL   LONG TERM GOALS: Target date: 07/27/2024  Patient will  show compliance and independence with final HEP to maintain and progress functional gains made it PT.  Baseline:  Goal status: INITIAL  2.  Patient will report pain increasing to no greater than 2/10 during functional activities.  Baseline:  Goal status: INITIAL  3.  Patient will increase PSFS total score to at least 6 to show significant improvement in subjective disability rating. Baseline:  Goal status: INITIAL  4.  Patient will increase hip extension strength to 5/5 to improve functional mechanics.   Baseline:  Goal status: INITIAL     PLAN:  PT FREQUENCY: 1-2x/week  PT DURATION: 8 weeks  PLANNED INTERVENTIONS: 97164- PT Re-evaluation, 97750-  Physical Performance Testing, 97110-Therapeutic exercises, 97530- Therapeutic activity, [id number]- Neuromuscular re-education, 97535- Self Care, 02859- Manual therapy, [id number]- Gait training, [phone]- Electrical stimulation (unattended), [phone]- Electrical stimulation (manual), [id number]- Vasopneumatic device, [id number]- Ultrasound, [id number]- Traction (mechanical), [id number]- Ionotophoresis 4mg /ml Dexamethasone , 79439 (1-2 muscles), 20561 (3+ muscles)- Dry Needling, Patient/Family education, Balance training, Stair training, Joint mobilization, Joint manipulation, Spinal manipulation, Spinal mobilization, Cryotherapy, and Moist heat.  PLAN FOR NEXT SESSION: *** core strength assessment, lumbar endurance assessment, hip flexibility   Susannah Daring, PT, DPT 06/16/24 10:06 AM

## 2024-06-19 ENCOUNTER — Ambulatory Visit: Admitting: Physical Medicine and Rehabilitation

## 2024-06-19 ENCOUNTER — Encounter

## 2024-06-19 ENCOUNTER — Encounter: Payer: Self-pay | Admitting: Physical Medicine and Rehabilitation

## 2024-06-19 DIAGNOSIS — M4316 Spondylolisthesis, lumbar region: Secondary | ICD-10-CM | POA: Diagnosis not present

## 2024-06-19 DIAGNOSIS — M5416 Radiculopathy, lumbar region: Secondary | ICD-10-CM | POA: Diagnosis not present

## 2024-06-19 DIAGNOSIS — G8929 Other chronic pain: Secondary | ICD-10-CM | POA: Diagnosis not present

## 2024-06-19 DIAGNOSIS — M5441 Lumbago with sciatica, right side: Secondary | ICD-10-CM | POA: Diagnosis not present

## 2024-06-19 NOTE — Progress Notes (Signed)
 Pain Scale   Average Pain 7 Patient advising she has lower back pain radiating to right hip and when pain starts she has to move around to ease the pain        +Driver, -BT, -Dye Allergies.

## 2024-06-19 NOTE — Progress Notes (Signed)
 Stacey Holland - 28 y.o. female MRN [id number]  Date of birth: 1996-05-26  Office Visit Note: Visit Date: 06/19/2024 PCP: Jason Leita Repine, FNP Referred by: Jason Leita Repine,*  Subjective: Chief Complaint  Patient presents with   Lower Back - Pain   HPI: Stacey Holland is a 28 y.o. female who comes in today per the request of Ronal Dragon Persons, PA for evaluation of chronic, worsening and severe right sided buttock pain radiating around to right lateral hip. Pain ongoing intermittently for several years, worsens with prolonged sitting. She describes pain as tight and squeezing sensation, currently rates as 6 out of 10. Some relief of pain with home exercise regimen, rest and use of medications. She recently started formal physical therapy. Recent lumbar radiographs shows anterolisthesis at L5-S1. No history of lumbar MRI imaging. No history of lumbar surgery/injections. Patient denies focal weakness, numbness and tingling. No recent trauma or falls.      Review of Systems  Musculoskeletal:  Positive for back pain.  Neurological:  Negative for tingling, sensory change, focal weakness and weakness.  All other systems reviewed and are negative.  Otherwise per HPI.  Assessment & Plan: Visit Diagnoses:    ICD-10-CM   1. Chronic bilateral low back pain with right-sided sciatica  G89.29 MR LUMBAR SPINE WO CONTRAST   M54.41     2. Lumbar radiculopathy  M54.16 MR LUMBAR SPINE WO CONTRAST    3. Spondylolisthesis of lumbar region  M43.16 MR LUMBAR SPINE WO CONTRAST       Plan: Findings:  Chronic, worsening and severe right sided buttock pain radiating around to right lateral hip. Patient continues to have pain despite good conservative therapies such as home exercise regimen, rest and use of medications. Patients clinical presentation and exam are consistent with lumbar radiculopathy, more of L5 nerve pattern. There is anterolisthesis at L5-S1, upon independent review there  could be pars defects at this level. We discussed treatment plan in detail today. Next step is to obtain lumbar MRI imaging. Depending on results of MRI imaging we would consider performing lumbar epidural steroid injection. Would also consider referral to our spine surgeon Dr. Ozell Ada. Patient has no questions at this time. Her exam today is non focal, good strength noted to bilateral lower extremities. No red flag symptoms noted upon exam today.     Meds & Orders: No orders of the defined types were placed in this encounter.   Orders Placed This Encounter  Procedures   MR LUMBAR SPINE WO CONTRAST    Follow-up: Return for Lumbar MRI review.   Procedures: No procedures performed      Clinical History: No specialty comments available.   She reports that she has never smoked. She has never used smokeless tobacco. No results for input(s): HGBA1C, LABURIC in the last 8760 hours.  Objective:  VS:  HT:    WT:   BMI:     BP:   HR: bpm  TEMP: ( )  RESP:  Physical Exam Vitals and nursing note reviewed.  HENT:     Head: Normocephalic and atraumatic.     Right Ear: External ear normal.     Left Ear: External ear normal.     Nose: Nose normal.     Mouth/Throat:     Mouth: Mucous membranes are moist.  Eyes:     Extraocular Movements: Extraocular movements intact.  Cardiovascular:     Rate and Rhythm: Normal rate.     Pulses: Normal pulses.  Pulmonary:     Effort: Pulmonary effort is normal.  Abdominal:     General: Abdomen is flat. There is no distension.  Musculoskeletal:        General: Tenderness present.     Cervical back: Normal range of motion.     Comments: Patient rises from seated position to standing without difficulty. Good lumbar range of motion. No pain noted with facet loading. 5/5 strength noted with bilateral hip flexion, knee flexion/extension, ankle dorsiflexion/plantarflexion and EHL. No clonus noted bilaterally. No pain upon palpation of greater  trochanters. No pain with internal/external rotation of bilateral hips. Sensation intact bilaterally. Negative slump test bilaterally. Ambulates without aid, gait steady.     Skin:    General: Skin is warm and dry.     Capillary Refill: Capillary refill takes less than 2 seconds.  Neurological:     General: No focal deficit present.     Mental Status: She is alert and oriented to person, place, and time.  Psychiatric:        Mood and Affect: Mood normal.        Behavior: Behavior normal.     Ortho Exam  Imaging: No results found.  Past Medical/Family/Surgical/Social History: Medications & Allergies reviewed per EMR, new medications updated. Patient Active Problem List   Diagnosis Date Noted   Chronic idiopathic constipation 07/03/2020   Fear of flying 06/09/2019   Panic anxiety syndrome 07/05/2018   Mixed anxiety and depressive disorder 04/27/2018   Gastroparesis 04/11/2018   Attention deficit hyperactivity disorder (ADHD) 01/14/2018   Vitamin D  deficiency 01/14/2018   Gastroesophageal reflux disease without esophagitis 01/14/2018   Eating disorder 01/14/2018   Attention and concentration deficit 12/01/2017   Bulimia 12/01/2017   Routine general medical examination at a health care facility 08/26/2016   Polycystic ovaries 04/03/2016   GE reflux    Polycystic ovarian syndrome 12/19/2012   Past Medical History:  Diagnosis Date   Anorexia nervosa    Anorexia nervosa    Chronic idiopathic constipation 12/01/2017   Dyspnea    breathing heavyeven when sitting, lying dpwn,exertion   Essential hypertension 02/20/2015   GERD (gastroesophageal reflux disease)    Headache    History of blood transfusion    prematurity, this was during perinatal period   Hypertension    Morbid obesity (HCC) 04/03/2016   Obstructive sleep apnea syndrome 01/14/2018   PCOS (polycystic ovarian syndrome) 04/03/2016   Reflux    Syncope    Tendinitis, de Quervain's 04/18/2024   Vomiting  associated with bulimia nervosa with nausea 06/22/2017   Family History  Problem Relation Age of Onset   GER disease Mother    Hypertension Mother    Arthritis Mother    GER disease Father    Hypertension Father    Healthy Sister    Healthy Brother    Lactose intolerance Brother    Diabetes Paternal Uncle    Hypertension Maternal Grandfather    Diabetes Maternal Grandfather    Stroke Maternal Grandfather    Dementia Maternal Grandmother    Healthy Paternal Grandmother    Cancer Neg Hx    Depression Neg Hx    Drug abuse Neg Hx    Early death Neg Hx    Heart disease Neg Hx    Hyperlipidemia Neg Hx    Kidney disease Neg Hx    Alcohol abuse Neg Hx    Asthma Neg Hx    Past Surgical History:  Procedure Laterality Date   CHOLECYSTECTOMY N/A  11/25/2016   Procedure: LAPAROSCOPIC CHOLECYSTECTOMY;  Surgeon: Lynda Leos, MD;  Location: [facility] OR;  Service: General;  Laterality: N/A;   ESOPHAGOGASTRODUODENOSCOPY N/A 09/26/2016   Procedure: ESOPHAGOGASTRODUODENOSCOPY (EGD);  Surgeon: Gordy CHRISTELLA Starch, MD;  Location: [facility];  Service: Endoscopy;  Laterality: N/A;   eustachion tubes     Social History   Occupational History   Occupation: Archivist  Tobacco Use   Smoking status: Never   Smokeless tobacco: Never  Vaping Use   Vaping status: Former   Quit date: 04/18/2020   Substances: Nicotine  Substance and Sexual Activity   Alcohol use: No   Drug use: No   Sexual activity: Never    Birth control/protection: Pill, Abstinence

## 2024-06-20 ENCOUNTER — Other Ambulatory Visit: Payer: Self-pay | Admitting: Family

## 2024-06-20 DIAGNOSIS — K219 Gastro-esophageal reflux disease without esophagitis: Secondary | ICD-10-CM

## 2024-06-23 NOTE — Therapy (Incomplete)
 OUTPATIENT PHYSICAL THERAPY THORACOLUMBAR EVALUATION   Patient Name: Stacey Holland MRN: [id number] DOB:09-07-1996, 28 y.o., female Today's Date: 06/23/2024  END OF SESSION:    Past Medical History:  Diagnosis Date   Anorexia nervosa    Anorexia nervosa    Chronic idiopathic constipation 12/01/2017   Dyspnea    breathing heavyeven when sitting, lying dpwn,exertion   Essential hypertension 02/20/2015   GERD (gastroesophageal reflux disease)    Headache    History of blood transfusion    prematurity, this was during perinatal period   Hypertension    Morbid obesity (HCC) 04/03/2016   Obstructive sleep apnea syndrome 01/14/2018   PCOS (polycystic ovarian syndrome) 04/03/2016   Reflux    Syncope    Tendinitis, de Quervain's 04/18/2024   Vomiting associated with bulimia nervosa with nausea 06/22/2017   Past Surgical History:  Procedure Laterality Date   CHOLECYSTECTOMY N/A 11/25/2016   Procedure: LAPAROSCOPIC CHOLECYSTECTOMY;  Surgeon: Lynda Leos, MD;  Location: [facility];  Service: General;  Laterality: N/A;   ESOPHAGOGASTRODUODENOSCOPY N/A 09/26/2016   Procedure: ESOPHAGOGASTRODUODENOSCOPY (EGD);  Surgeon: Gordy CHRISTELLA Starch, MD;  Location: [facility];  Service: Endoscopy;  Laterality: N/A;   eustachion tubes     Patient Active Problem List   Diagnosis Date Noted   Chronic idiopathic constipation 07/03/2020   Fear of flying 06/09/2019   Panic anxiety syndrome 07/05/2018   Mixed anxiety and depressive disorder 04/27/2018   Gastroparesis 04/11/2018   Attention deficit hyperactivity disorder (ADHD) 01/14/2018   Vitamin D  deficiency 01/14/2018   Gastroesophageal reflux disease without esophagitis 01/14/2018   Eating disorder 01/14/2018   Attention and concentration deficit 12/01/2017   Bulimia 12/01/2017   Routine general medical examination at a health care facility 08/26/2016   Polycystic ovaries 04/03/2016   GE reflux    Polycystic ovarian syndrome 12/19/2012     PCP: Leita Eliza Elbe, FNP  REFERRING PROVIDER: Ronal Dragon Persons, PA-C  REFERRING DIAG: M54.50,M79.604 (ICD-10-CM) - Low back pain radiating to right lower extremity  Rationale for Evaluation and Treatment: Rehabilitation  THERAPY DIAG:  No diagnosis found.  ONSET DATE: 2017-2018  SUBJECTIVE:                                                                                                                                                                                           SUBJECTIVE STATEMENT: ***   PERTINENT HISTORY:  Patient states that her pain/discomfort began in 2017-2018 and comes and goes. However, patient notes that pain has consistently become worse and is becoming more constant. The discomfort is typically in R glute with very little discomfort/pain in lower back. It has  started to affect her sleep and her occupation.    PAIN:  Are you having pain? Yes: NPRS scale: 1/10 resting at beginning of session; 7/10 at its worst  Pain location: R hip Pain description: uncomfortable, aching  Aggravating factors: walking for prolonged periods, up and downstairs, standing for prolonged periods of time  Relieving factors: bending/flexion, stretching/mobility   PRECAUTIONS: None  RED FLAGS: None   WEIGHT BEARING RESTRICTIONS: No  FALLS:  Has patient fallen in last 6 months? 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  DIAGNOSTIC FINDINGS:  2 view radiographs of the lumbar spine demonstrate significant anterior  listhesis at L5-S1 with degenerative changes seems to have  increased since  previous films in 2019   PATIENT SURVEYS:  PSFS: THE PATIENT SPECIFIC FUNCTIONAL SCALE  Place score of 0-10 (0 = unable to perform activity and 10 = able to perform activity at the same level as before injury or problem)  Activity Date: 06/01/2024    Walking for prolonged times   4    2. Going up and down stairs   4    3. Sleeping   4    4. Standing for prolonged times   4    Total Score 4      Total Score = Sum of activity scores/number of activities  Minimally Detectable Change: 3 points (for single activity); 2 points (for average score)  [facility] (nd). The Patient Specific Functional Scale . Retrieved from [email or web address]   COGNITION: Overall cognitive status: Within functional limits for tasks assessed     SENSATION: Light touch: WFL  MUSCLE LENGTH: Not tested on eval  POSTURE: rounded shoulders  PALPATION: Relief of symptoms with STM at R glute  LUMBAR ROM:   AROM Eval 06/01/2024  Flexion WFL  Extension WFL  Right lateral flexion WFL, Pressure on R side  Left lateral flexion WFL  Right rotation WFL. Pressure on R glute  Left rotation WFL   (Blank rows = not tested)  LOWER EXTREMITY ROM:     ROM Right Eval 06/01/2024 Left Eval 06/01/2024  Hip flexion [facility]  Hip extension    Hip abduction    Hip adduction    Hip internal rotation    Hip external rotation    Knee flexion    Knee extension    Ankle dorsiflexion    Ankle plantarflexion    Ankle inversion    Ankle eversion     (Blank rows = not tested)  LOWER EXTREMITY MMT:    MMT Right Eval 06/01/2024 Left Eval 06/01/2024  Hip flexion 4+/5 (seated) 4+/5 (Seated)  Hip extension 4+/5 4+/5  Hip abduction 5/5 5/5  Hip adduction    Hip internal rotation    Hip external rotation    Knee flexion 5/5 (seated) 5/5 (seated)  Knee extension 5/5 (seated) 5/5 (Seated)  Ankle dorsiflexion    Ankle  plantarflexion    Ankle inversion    Ankle eversion     (Blank rows = not tested)  LUMBAR SPECIAL TESTS:  Straight leg raise test: Positive and Slump test: Positive; both are  more pressure/uncomfortable; SLR with DF, slump with DF and cervical   FUNCTIONAL TESTS:  Core strength: to be assessed at next session  Lumbar endurance (superwoman): to be assessed at next session  GAIT: Distance walked: not formally assessed  Assistive device utilized: None Level of assistance: Complete Independence Comments:   TREATMENT DATE:  06/26/2024 ***  06/01/2024       TherEx:   HEP handout provided with patient performing one set of each exercise for appropriate biomechanics. Verbal cues required.  Discussed progression and regression of the exercises and how to adjust with days that may be more painful                                                                                                                      PATIENT EDUCATION:  Education details: HEP, progressions, regressions, how to adjust with pain Person educated: Patient Education method: Explanation, Demonstration, Verbal cues, and Handouts Education comprehension: verbalized understanding and returned demonstration  HOME EXERCISE PROGRAM: Access Code: [email or web address] URL: [email or web address] Date: 06/01/2024 Prepared by: Susannah Daring  Exercises - Supine Lower Trunk Rotation  - 1 x daily - 7 x weekly - 3 sets - 10 reps - 2-3 hold - Seated Figure 4 Piriformis Stretch  - 1 x daily - 7 x weekly - 3 sets - 45 hold - Seated Single Knee to Chest  - 1 x daily - 7 x weekly - 3 sets - 45 hold - Seated Slump Nerve Glide  - 1 x daily - 7 x weekly - 3 sets - 10 reps - Supine March  - 1 x daily - 7 x weekly - 3 sets - 10 reps  ASSESSMENT:  CLINICAL IMPRESSION: ***  Patient is a 28 y.o. M who was seen today for physical therapy evaluation and treatment for low back pain with radiculopathy to R glute. Patient has slight  strength deficits and pain levels that affect her occupation and sleep. PT will assess core and lumbar endurance and strength at next session. Patient will benefit from skilled PT to address above noted deficits.   OBJECTIVE IMPAIRMENTS: decreased activity tolerance, difficulty walking, decreased strength, postural dysfunction, and pain.   ACTIVITY LIMITATIONS: sitting, standing, and sleeping  PARTICIPATION LIMITATIONS: community activity and occupation  PERSONAL FACTORS: 3+ comorbidities: GERD, anxiety, eating disorder, HTN, OSA are also affecting patient's functional outcome.   REHAB POTENTIAL: Good  CLINICAL DECISION MAKING: Stable/uncomplicated  EVALUATION COMPLEXITY: Low   GOALS: Goals reviewed with patient? Yes  SHORT TERM GOALS: Target date: 06/22/2024  Patient will show compliance with initial HEP. Baseline: Goal status: INITIAL  2.  Patient will report pain increasing to no greater than 4/10 during functional activities. Baseline:  Goal status: INITIAL  3. Patient will tolerate core and lumbar endurance assessment.  Baseline:   Goal status: INITIAL   LONG TERM GOALS: Target date: 07/27/2024  Patient will show compliance and independence with final HEP to maintain and progress functional gains made it PT.  Baseline:  Goal  status: INITIAL  2.  Patient will report pain increasing to no greater than 2/10 during functional activities.  Baseline:  Goal status: INITIAL  3.  Patient will increase PSFS total score to at least 6 to show significant improvement in subjective disability rating. Baseline:  Goal status: INITIAL  4.  Patient will increase hip extension strength to 5/5 to improve functional mechanics.   Baseline:  Goal status: INITIAL     PLAN:  PT FREQUENCY: 1-2x/week  PT DURATION: 8 weeks  PLANNED INTERVENTIONS: 97164- PT Re-evaluation, 97750- Physical Performance Testing, 97110-Therapeutic exercises, 97530- Therapeutic activity, [id number]-  Neuromuscular re-education, 97535- Self Care, 02859- Manual therapy, [id number]- Gait training, [phone]- Electrical stimulation (unattended), [phone]- Electrical stimulation (manual), [id number]- Vasopneumatic device, [id number]- Ultrasound, [id number]- Traction (mechanical), [id number]- Ionotophoresis 4mg /ml Dexamethasone , 79439 (1-2 muscles), 20561 (3+ muscles)- Dry Needling, Patient/Family education, Balance training, Stair training, Joint mobilization, Joint manipulation, Spinal manipulation, Spinal mobilization, Cryotherapy, and Moist heat.  PLAN FOR NEXT SESSION: *** core strength assessment, lumbar endurance assessment, hip flexibility   Susannah Daring, PT, DPT 06/23/24 3:07 PM

## 2024-06-26 ENCOUNTER — Encounter

## 2024-07-02 ENCOUNTER — Other Ambulatory Visit: Payer: Self-pay | Admitting: Family

## 2024-07-03 ENCOUNTER — Encounter

## 2024-07-06 ENCOUNTER — Ambulatory Visit (HOSPITAL_BASED_OUTPATIENT_CLINIC_OR_DEPARTMENT_OTHER): Admitting: Physician Assistant

## 2024-07-06 ENCOUNTER — Encounter (HOSPITAL_BASED_OUTPATIENT_CLINIC_OR_DEPARTMENT_OTHER): Payer: Self-pay | Admitting: Physician Assistant

## 2024-07-06 ENCOUNTER — Ambulatory Visit (INDEPENDENT_AMBULATORY_CARE_PROVIDER_SITE_OTHER): Admitting: Physician Assistant

## 2024-07-06 ENCOUNTER — Encounter: Payer: Self-pay | Admitting: Physician Assistant

## 2024-07-06 ENCOUNTER — Ambulatory Visit: Admitting: Physician Assistant

## 2024-07-06 DIAGNOSIS — F902 Attention-deficit hyperactivity disorder, combined type: Secondary | ICD-10-CM | POA: Diagnosis not present

## 2024-07-06 DIAGNOSIS — F32A Depression, unspecified: Secondary | ICD-10-CM

## 2024-07-06 DIAGNOSIS — M654 Radial styloid tenosynovitis [de Quervain]: Secondary | ICD-10-CM

## 2024-07-06 DIAGNOSIS — G4700 Insomnia, unspecified: Secondary | ICD-10-CM | POA: Diagnosis not present

## 2024-07-06 DIAGNOSIS — F411 Generalized anxiety disorder: Secondary | ICD-10-CM

## 2024-07-06 DIAGNOSIS — M25531 Pain in right wrist: Secondary | ICD-10-CM | POA: Diagnosis not present

## 2024-07-06 MED ORDER — AMPHETAMINE-DEXTROAMPHET ER 30 MG PO CP24: 30.0000 mg | ORAL_CAPSULE | Freq: Every day | ORAL | 0 refills | Status: DC

## 2024-07-06 MED ORDER — ALPRAZOLAM 0.5 MG PO TABS: 0.5000 mg | ORAL_TABLET | Freq: Two times a day (BID) | ORAL | 0 refills | Status: DC | PRN

## 2024-07-06 MED ORDER — LIDOCAINE HCL 1 % IJ SOLN
1.0000 mL | INTRAMUSCULAR | Status: AC | PRN
  Administered 2024-07-06: 1 mL

## 2024-07-06 MED ORDER — AMPHETAMINE-DEXTROAMPHET ER 30 MG PO CP24: 30.0000 mg | ORAL_CAPSULE | Freq: Every morning | ORAL | 0 refills | Status: DC

## 2024-07-06 MED ORDER — TRAZODONE HCL 50 MG PO TABS
50.0000 mg | ORAL_TABLET | Freq: Every day | ORAL | 5 refills | Status: DC
Start: 2024-07-06 — End: 2024-07-28

## 2024-07-06 MED ORDER — TRIAMCINOLONE ACETONIDE 40 MG/ML IJ SUSP
40.0000 mg | INTRAMUSCULAR | Status: AC | PRN
  Administered 2024-07-06: 40 mg

## 2024-07-06 NOTE — Progress Notes (Signed)
 Office Visit Note   Patient: Stacey Holland           Date of Birth: [birth year]           MRN: [id number] Visit Date: 07/06/2024              Requested by: Jason Leita Repine, FNP [address] [place],  [place] [address] PCP: Jason Leita Repine, FNP  Chief Complaint  Patient presents with  . Right Wrist - Pain      HPI: Patient is a pleasant 28 year old woman who I have treated for de Quervain's tenosynovitis in the past with an injection.  Helped her quite a bit she now has a return of her symptoms no new injury  Assessment & Plan: Visit Diagnoses:  1. De Quervain's tenosynovitis, right     Plan: De Quervain's tenosynovitis.  Will go forward with an injection into the first dorsal compartment today.  If she has a return of symptoms I would recommend referral to Dr. Arlinda  Follow-Up Instructions: Return if symptoms worsen or fail to improve.   Ortho Exam  Patient is alert, oriented, no adenopathy, well-dressed, normal affect, normal respiratory effort. Examination of her right wrist she is neurovascular intact no redness no erythema she is focally tender over the first dorsal compartment.    Imaging: No results found. No images are attached to the encounter.  Labs: Lab Results  Component Value Date   HGBA1C 5.2 07/27/2016   HGBA1C 5.4 04/03/2016   ESRSEDRATE 50 (H) 05/25/2023   ESRSEDRATE 23 (H) 12/27/2017   ESRSEDRATE 58 (H) 01/06/2017   CRP 8.6 (H) 01/06/2017   CRP 0.1 (L) 10/22/2016   CRP 50.1 (H) 09/23/2016     Lab Results  Component Value Date   ALBUMIN 3.7 01/18/2024   ALBUMIN 3.9 10/19/2023   ALBUMIN 3.8 05/25/2023    Lab Results  Component Value Date   MG 1.8 05/25/2023   MG 1.7 07/25/2018   MG 1.6 05/16/2018   Lab Results  Component Value Date   VD25OH 35.70 10/19/2023   VD25OH 54.47 05/25/2023   VD25OH 64.6 09/23/2016    No results found for: PREALBUMIN    Latest Ref Rng & Units 01/18/2024    2:34  PM 10/19/2023    2:04 PM 05/25/2023    4:36 PM  CBC EXTENDED  WBC 4.0 - 10.5 K/uL 7.3  5.4  5.9   RBC 3.87 - 5.11 Mil/uL 4.79  4.94  4.78   Hemoglobin 12.0 - 15.0 g/dL 87.3  87.0  87.6   HCT 36.0 - 46.0 % 39.1  40.4  38.2   Platelets 150.0 - 400.0 K/uL 282.0  283.0  305.0   NEUT# 1.4 - 7.7 K/uL 5.1  2.4  2.4   Lymph# 0.7 - 4.0 K/uL 1.8  2.4  2.8      There is no height or weight on file to calculate BMI.  Orders:  No orders of the defined types were placed in this encounter.  No orders of the defined types were placed in this encounter.    Procedures: Hand/UE Inj for de Quervain's tenosynovitis on 07/06/2024 4:43 PM Medications: 1 mL lidocaine  1 %; 40 mg triamcinolone  acetonide 40 MG/ML    Clinical Data: No additional findings.  ROS:  All other systems negative, except as noted in the HPI. Review of Systems  Objective: Vital Signs: There were no vitals taken for this visit.  Specialty Comments:  No specialty comments  available.  PMFS History: Patient Active Problem List   Diagnosis Date Noted  . De Quervain's tenosynovitis, right 04/18/2024  . Chronic idiopathic constipation 07/03/2020  . Fear of flying 06/09/2019  . Panic anxiety syndrome 07/05/2018  . Mixed anxiety and depressive disorder 04/27/2018  . Gastroparesis 04/11/2018  . Attention deficit hyperactivity disorder (ADHD) 01/14/2018  . Vitamin D  deficiency 01/14/2018  . Gastroesophageal reflux disease without esophagitis 01/14/2018  . Eating disorder 01/14/2018  . Attention and concentration deficit 12/01/2017  . Bulimia 12/01/2017  . Routine general medical examination at a health care facility 08/26/2016  . Polycystic ovaries 04/03/2016  . GE reflux   . Polycystic ovarian syndrome 12/19/2012   Past Medical History:  Diagnosis Date  . Anorexia nervosa   . Anorexia nervosa   . Chronic idiopathic constipation 12/01/2017  . Dyspnea    breathing heavyeven when sitting, lying dpwn,exertion  .  Essential hypertension 02/20/2015  . GERD (gastroesophageal reflux disease)   . Headache   . History of blood transfusion    prematurity, this was during perinatal period  . Hypertension   . Morbid obesity (HCC) 04/03/2016  . Obstructive sleep apnea syndrome 01/14/2018  . PCOS (polycystic ovarian syndrome) 04/03/2016  . Reflux   . Syncope   . Tendinitis, de Quervain's 04/18/2024  . Vomiting associated with bulimia nervosa with nausea 06/22/2017    Family History  Problem Relation Age of Onset  . GER disease Mother   . Hypertension Mother   . Arthritis Mother   . GER disease Father   . Hypertension Father   . Healthy Sister   . Healthy Brother   . Lactose intolerance Brother   . Diabetes Paternal Uncle   . Hypertension Maternal Grandfather   . Diabetes Maternal Grandfather   . Stroke Maternal Grandfather   . Dementia Maternal Grandmother   . Healthy Paternal Grandmother   . Cancer Neg Hx   . Depression Neg Hx   . Drug abuse Neg Hx   . Early death Neg Hx   . Heart disease Neg Hx   . Hyperlipidemia Neg Hx   . Kidney disease Neg Hx   . Alcohol abuse Neg Hx   . Asthma Neg Hx     Past Surgical History:  Procedure Laterality Date  . CHOLECYSTECTOMY N/A 11/25/2016   Procedure: LAPAROSCOPIC CHOLECYSTECTOMY;  Surgeon: Lynda Leos, MD;  Location: [facility];  Service: General;  Laterality: N/A;  . ESOPHAGOGASTRODUODENOSCOPY N/A 09/26/2016   Procedure: ESOPHAGOGASTRODUODENOSCOPY (EGD);  Surgeon: Gordy CHRISTELLA Starch, MD;  Location: [facility];  Service: Endoscopy;  Laterality: N/A;  . eustachion tubes     Social History   Occupational History  . Occupation: Archivist  Tobacco Use  . Smoking status: Never  . Smokeless tobacco: Never  Vaping Use  . Vaping status: Former  . Quit date: 04/18/2020  . Substances: Nicotine  Substance and Sexual Activity  . Alcohol use: No  . Drug use: No  . Sexual activity: Never    Birth control/protection: Pill, Abstinence

## 2024-07-06 NOTE — Progress Notes (Unsigned)
 [facility] Med Check  Patient ID: Stacey Holland,  MRN: [id number]  PCP: Jason Leita Repine, FNP  Date of Evaluation: 07/06/2024 Time spent:30 minutes  Chief Complaint:   HISTORY/CURRENT STATUS: HPI for routine med check.  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 Not in school right now. Think    Individual Medical History/ Review of Systems: Changes? :No   Past medications for mental health diagnoses include: Xanax  for fear of flying, Klonopin , Ativan , BuSpar , nortriptyline , Celexa , Lexapro, trazodone , gabapentin, Lamictal,  guanfacine, Adderall, Cymbalta  caused extremely high BP, hydroxyzine  caused her to feel funny   Eating Disorder, Anorexia/bulemia about 4 years ago, went to inpatient treatment [address].  Allergies: Cymbalta  [duloxetine  hcl], Guanfacine, Nortriptyline , Other, Pineapple, and Pineapple extract  Current Medications:  Current Outpatient Medications:    traZODone  (DESYREL ) 100 MG tablet, TAKE 1/2 TO 1 TABLET (50-100 MG TOTAL) BY MOUTH AT BEDTIME AS NEEDED FOR SLEEP, Disp: 90 tablet, Rfl: 0   ALPRAZolam  (XANAX ) 0.5 MG tablet, Take 1-2 tablets (0.5-1 mg total) by mouth 2 (two) times daily as needed for anxiety. Take sparingly., Disp: 20 tablet, Rfl: 0   ALPRAZolam  (XANAX ) 1 MG tablet, Take 1 mg by mouth 3 (three) times daily., Disp: , Rfl:    amphetamine -dextroamphetamine  (ADDERALL XR) 30 MG 24 hr capsule, Take 1 capsule (30 mg total) by mouth daily., Disp: 30 capsule, Rfl: 0   amphetamine -dextroamphetamine  (ADDERALL XR) 30 MG 24 hr capsule, Take 1 capsule (30 mg total) by mouth in the morning. (Patient not taking: Reported on 04/11/2024), Disp: 30 capsule, Rfl: 0   amphetamine -dextroamphetamine  (ADDERALL) 10 MG tablet, Take 1 tablet (10 mg total) by mouth 2 (two) times daily with a meal. (Patient not taking: Reported on 04/11/2024), Disp: 60 tablet, Rfl: 0   amphetamine -dextroamphetamine  (ADDERALL) 10 MG tablet, 1 p.o. at lunch  and 1 p.o. in the late afternoon. (Patient not taking: Reported on 04/11/2024), Disp: 60 tablet, Rfl: 0   amphetamine -dextroamphetamine  (ADDERALL) 10 MG tablet, Take 1 tablet (10 mg total) by mouth 2 (two) times daily with a meal., Disp: 60 tablet, Rfl: 0   cetirizine (ZYRTEC) 10 MG tablet, Take 10 mg by mouth daily., Disp: , Rfl:    fluticasone  (FLONASE ) 50 MCG/ACT nasal spray, Place 1 spray into both nostrils daily., Disp: , Rfl:    ketorolac  (TORADOL ) 10 MG tablet, Take 1 tablet (10 mg total) by mouth every 6 (six) hours as needed (pain)., Disp: 20 tablet, Rfl: 0   methylPREDNISolone  (MEDROL  DOSEPAK) 4 MG TBPK tablet, Take as directed with food, Disp: 21 tablet, Rfl: 0   norgestimate -ethinyl estradiol  (ORTHO-CYCLEN) 0.25-35 MG-MCG tablet, TAKE 1 TABLET BY MOUTH EVERY DAY, Disp: 84 tablet, Rfl: 0   pantoprazole  (PROTONIX ) 40 MG tablet, TAKE 1 TABLET BY MOUTH EVERY DAY, Disp: 90 tablet, Rfl: 0   Plecanatide (TRULANCE) 3 MG TABS, Take 3 mg by mouth daily at 2 PM., Disp: , Rfl:    rizatriptan  (MAXALT ) 10 MG tablet, Take 10 mg by mouth as needed., Disp: , Rfl:    tiZANidine  (ZANAFLEX ) 4 MG tablet, Take 1 tablet (4 mg total) by mouth 2 (two) times daily as needed (hip pain)., Disp: 20 tablet, Rfl: 0   triamcinolone  cream (KENALOG ) 0.5 %, Apply 1 Application topically 3 (three) times daily., Disp: , Rfl:  Medication Side Effects: Possible hypertension from Cymbalta , see notes from 01/14/2024 and 01/25/2024  Family Medical/ Social History: Changes? No  MENTAL HEALTH EXAM:  There were no vitals taken for this visit.There  is no height or weight on file to calculate BMI.  General Appearance: Casual and Well Groomed  Eye Contact:  Good  Speech:  Clear and Coherent and Normal Rate  Volume:  Normal  Mood:  Euthymic  Affect:  Congruent  Thought Process:  Goal Directed and Descriptions of Associations: Circumstantial  Orientation:  Full (Time, Place, and Person)  Thought Content: Logical   Suicidal  Thoughts:  No  Homicidal Thoughts:  No  Memory:  WNL  Judgement:  Good  Insight:  Good  Psychomotor Activity:  Normal  Concentration:  Concentration: Good and Attention Span: Good  Recall:  Good  Fund of Knowledge: Good  Language: Good  Assets:  Communication Skills Desire for Improvement Financial Resources/Insurance Housing Transportation Vocational/Educational  ADL's:  Intact  Cognition: WNL  Prognosis:  Good   DIAGNOSES:  No diagnosis found.  Receiving Psychotherapy: No   RECOMMENDATIONS:  PDMP reviewed.  Adderall filled 06/18/2024.  Xanax  filled 05/15/2024.        Continue Xanax  0.5 mg, 1/2-1 twice daily as needed anxiety, take sparingly. Continue Adderall XR  30 mg, 1 p.o. every morning. Continue Adderall 10 mg, 1/2 at lunch and 1/2 at supper prn.  OR 1 po at lunch prn.   Continue propranolol  10 mg, 1-2 p.o. 3 times daily as needed anxiety.  She rarely takes. Continue Trazodone  50 mg, po at bedtime prn.  Return in 3 months.  Verneita Cooks, PA-C

## 2024-07-06 NOTE — Progress Notes (Deleted)
 Office Visit Note   Patient: Stacey Holland           Date of Birth: July 11, 1996           MRN: [id number] Visit Date: 07/06/2024              Requested by: Jason Leita Repine, FNP [address] [place],  [place] [address] PCP: Jason Leita Repine, FNP  Chief Complaint  Patient presents with   Right Wrist - Pain      HPI: Patient is a pleasant 28 year old woman who I have injected for de Quervain's tenosynovitis in the right wrist before.  She did get good relief from this.  She thinks she reaggravated and requesting injection today no particular injury  Assessment & Plan: Visit Diagnoses: No diagnosis found.  Plan: ***  Follow-Up Instructions: No follow-ups on file.   Ortho Exam  Patient is alert, oriented, no adenopathy, well-dressed, normal affect, normal respiratory effort. ***    Imaging: No results found. No images are attached to the encounter.  Labs: Lab Results  Component Value Date   HGBA1C 5.2 07/27/2016   HGBA1C 5.4 04/03/2016   ESRSEDRATE 50 (H) 05/25/2023   ESRSEDRATE 23 (H) 12/27/2017   ESRSEDRATE 58 (H) 01/06/2017   CRP 8.6 (H) 01/06/2017   CRP 0.1 (L) 10/22/2016   CRP 50.1 (H) 09/23/2016     Lab Results  Component Value Date   ALBUMIN 3.7 01/18/2024   ALBUMIN 3.9 10/19/2023   ALBUMIN 3.8 05/25/2023    Lab Results  Component Value Date   MG 1.8 05/25/2023   MG 1.7 07/25/2018   MG 1.6 05/16/2018   Lab Results  Component Value Date   VD25OH 35.70 10/19/2023   VD25OH 54.47 05/25/2023   VD25OH 64.6 09/23/2016    No results found for: PREALBUMIN    Latest Ref Rng & Units 01/18/2024    2:34 PM 10/19/2023    2:04 PM 05/25/2023    4:36 PM  CBC EXTENDED  WBC 4.0 - 10.5 K/uL 7.3  5.4  5.9   RBC 3.87 - 5.11 Mil/uL 4.79  4.94  4.78   Hemoglobin 12.0 - 15.0 g/dL 87.3  87.0  87.6   HCT 36.0 - 46.0 % 39.1  40.4  38.2   Platelets 150.0 - 400.0 K/uL 282.0  283.0  305.0   NEUT# 1.4 - 7.7 K/uL 5.1  2.4  2.4    Lymph# 0.7 - 4.0 K/uL 1.8  2.4  2.8      There is no height or weight on file to calculate BMI.  Orders:  No orders of the defined types were placed in this encounter.  No orders of the defined types were placed in this encounter.    Procedures: No procedures performed  Clinical Data: No additional findings.  ROS:  All other systems negative, except as noted in the HPI. Review of Systems  Objective: Vital Signs: There were no vitals taken for this visit.  Specialty Comments:  No specialty comments available.  PMFS History: Patient Active Problem List   Diagnosis Date Noted   Chronic idiopathic constipation 07/03/2020   Fear of flying 06/09/2019   Panic anxiety syndrome 07/05/2018   Mixed anxiety and depressive disorder 04/27/2018   Gastroparesis 04/11/2018   Attention deficit hyperactivity disorder (ADHD) 01/14/2018   Vitamin D  deficiency 01/14/2018   Gastroesophageal reflux disease without esophagitis 01/14/2018   Eating disorder 01/14/2018   Attention and concentration deficit 12/01/2017   Bulimia  12/01/2017   Routine general medical examination at a health care facility 08/26/2016   Polycystic ovaries 04/03/2016   GE reflux    Polycystic ovarian syndrome 12/19/2012   Past Medical History:  Diagnosis Date   Anorexia nervosa    Anorexia nervosa    Chronic idiopathic constipation 12/01/2017   Dyspnea    breathing heavyeven when sitting, lying dpwn,exertion   Essential hypertension 02/20/2015   GERD (gastroesophageal reflux disease)    Headache    History of blood transfusion    prematurity, this was during perinatal period   Hypertension    Morbid obesity (HCC) 04/03/2016   Obstructive sleep apnea syndrome 01/14/2018   PCOS (polycystic ovarian syndrome) 04/03/2016   Reflux    Syncope    Tendinitis, de Quervain's 04/18/2024   Vomiting associated with bulimia nervosa with nausea 06/22/2017    Family History  Problem Relation Age of Onset   GER  disease Mother    Hypertension Mother    Arthritis Mother    GER disease Father    Hypertension Father    Healthy Sister    Healthy Brother    Lactose intolerance Brother    Diabetes Paternal Uncle    Hypertension Maternal Grandfather    Diabetes Maternal Grandfather    Stroke Maternal Grandfather    Dementia Maternal Grandmother    Healthy Paternal Grandmother    Cancer Neg Hx    Depression Neg Hx    Drug abuse Neg Hx    Early death Neg Hx    Heart disease Neg Hx    Hyperlipidemia Neg Hx    Kidney disease Neg Hx    Alcohol abuse Neg Hx    Asthma Neg Hx     Past Surgical History:  Procedure Laterality Date   CHOLECYSTECTOMY N/A 11/25/2016   Procedure: LAPAROSCOPIC CHOLECYSTECTOMY;  Surgeon: Lynda Leos, MD;  Location: [facility] OR;  Service: General;  Laterality: N/A;   ESOPHAGOGASTRODUODENOSCOPY N/A 09/26/2016   Procedure: ESOPHAGOGASTRODUODENOSCOPY (EGD);  Surgeon: Gordy CHRISTELLA Starch, MD;  Location: [facility];  Service: Endoscopy;  Laterality: N/A;   eustachion tubes     Social History   Occupational History   Occupation: Archivist  Tobacco Use   Smoking status: Never   Smokeless tobacco: Never  Vaping Use   Vaping status: Former   Quit date: 04/18/2020   Substances: Nicotine  Substance and Sexual Activity   Alcohol use: No   Drug use: No   Sexual activity: Never    Birth control/protection: Pill, Abstinence

## 2024-07-11 ENCOUNTER — Encounter

## 2024-07-17 ENCOUNTER — Encounter

## 2024-07-18 ENCOUNTER — Ambulatory Visit
Admission: RE | Admit: 2024-07-18 | Discharge: 2024-07-18 | Disposition: A | Discharge status: Home / Self Care | Source: Ambulatory Visit

## 2024-07-18 DIAGNOSIS — B351 Tinea unguium: Secondary | ICD-10-CM | POA: Diagnosis not present

## 2024-07-18 DIAGNOSIS — L609 Nail disorder, unspecified: Secondary | ICD-10-CM | POA: Diagnosis not present

## 2024-07-18 MED ORDER — CLOTRIMAZOLE 1 % EX CREA
TOPICAL_CREAM | CUTANEOUS | 0 refills | Status: DC
Start: 2024-07-18 — End: 2024-08-30

## 2024-07-18 NOTE — Discharge Instructions (Addendum)
 You have a fungal infection of your nail.  Use clotrimazole  every 12 hours for the next 2 weeks.  Allow the nail to continue to grow out.   If you notice redness, swelling, drainage, or warmth to the tip of the finger, please come back to urgent care.   If you develop any new or worsening symptoms or if your symptoms do not start to improve, please return here or follow-up with your primary care provider. If your symptoms are severe, please go to the emergency room.

## 2024-07-18 NOTE — ED Triage Notes (Signed)
 Patient reports hurting left index finger (dropping something onto it) weeks ago, I thought my nail had a fungal infection on it so started a topical oil, no improvement. Only affecting nail (not finger/bone).

## 2024-07-18 NOTE — ED Provider Notes (Signed)
 [name]    CSN: [id number] Arrival date & time: 07/18/24  1246      History   Chief Complaint Chief Complaint  Patient presents with   Injury    HPI Stacey Holland is a 28 y.o. female.   Stacey Holland is a 28 y.o. female presenting for chief complaint of nail problem to the left index finger nail that happened several weeks ago as a result of crushing injury to the tip of the left index finger.  She noticed the nail turned yellow underneath the fingernail and googled her symptoms/felt the nail to have a fungal infection. She purchased an over-the-counter nail fungal oil and has been using this as directed (every 2 to 3 days) for the last few weeks. She has filed the nail back to expose the nailbed so that the oil is able to treat the infection appropriately. States the nail has started to grow back again a little bit, she has continued to file the nail back to treat suspected fungus. Denies numbness/tingling distally, warmth, erythema, and drainage from the site. Site is dry and flaky. No fevers or chills.      Past Medical History:  Diagnosis Date   Anorexia nervosa    Anorexia nervosa    Chronic idiopathic constipation 12/01/2017   Dyspnea    breathing heavyeven when sitting, lying dpwn,exertion   Essential hypertension 02/20/2015   GERD (gastroesophageal reflux disease)    Headache    History of blood transfusion    prematurity, this was during perinatal period   Hypertension    Morbid obesity (HCC) 04/03/2016   Obstructive sleep apnea syndrome 01/14/2018   PCOS (polycystic ovarian syndrome) 04/03/2016   Reflux    Syncope    Tendinitis, de Quervain's 04/18/2024   Vomiting associated with bulimia nervosa with nausea 06/22/2017    Patient Active Problem List   Diagnosis Date Noted   De Quervain's tenosynovitis, right 04/18/2024   Chronic idiopathic constipation 07/03/2020   Fear of flying 06/09/2019   Panic anxiety syndrome 07/05/2018    Mixed anxiety and depressive disorder 04/27/2018   Gastroparesis 04/11/2018   Attention deficit hyperactivity disorder (ADHD) 01/14/2018   Vitamin D  deficiency 01/14/2018   Gastroesophageal reflux disease without esophagitis 01/14/2018   Eating disorder 01/14/2018   Attention and concentration deficit 12/01/2017   Bulimia 12/01/2017   Routine general medical examination at a health care facility 08/26/2016   Polycystic ovaries 04/03/2016   GE reflux    Polycystic ovarian syndrome 12/19/2012    Past Surgical History:  Procedure Laterality Date   CHOLECYSTECTOMY N/A 11/25/2016   Procedure: LAPAROSCOPIC CHOLECYSTECTOMY;  Surgeon: Lynda Leos, MD;  Location: [facility] OR;  Service: General;  Laterality: N/A;   ESOPHAGOGASTRODUODENOSCOPY N/A 09/26/2016   Procedure: ESOPHAGOGASTRODUODENOSCOPY (EGD);  Surgeon: Gordy CHRISTELLA Starch, MD;  Location: [facility];  Service: Endoscopy;  Laterality: N/A;   eustachion tubes      OB History   No obstetric history on file.      Home Medications    Prior to Admission medications   Medication Sig Start Date End Date Taking? Authorizing Provider  amoxicillin  (AMOXIL ) 875 MG tablet Take 875 mg by mouth 2 (two) times daily. 04/17/21  Yes [provider]  amphetamine -dextroamphetamine  (ADDERALL XR) 30 MG 24 hr capsule Take 1 capsule (30 mg total) by mouth daily. 08/16/24  Yes Rhys Boyer T, PA-C  clotrimazole  (LOTRIMIN ) 1 % cream Apply to affected area 2 times daily 07/18/24  Yes Jennifer Payes,  Dorna HERO, FNP  dicyclomine  (BENTYL ) 10 MG capsule Take 10 mg by mouth as directed. 04/17/21  Yes [provider]  HYDROcodone -acetaminophen  (NORCO/VICODIN) 5-325 MG tablet 1-2 tablets every 6 (six) hours as needed. 04/17/21  Yes [provider]  ibuprofen  (ADVIL ) 600 MG tablet Take 600 mg by mouth every 6 (six) hours as needed. 04/17/21  Yes [provider]  lamoTRIgine (LAMICTAL) 100 MG tablet Take 100 mg by mouth as directed. 04/17/21  Yes  [provider]  meloxicam  (MOBIC ) 15 MG tablet Take 15 mg by mouth daily. 05/13/24  Yes [provider]  Norgestimate -Ethinyl Estradiol  Triphasic (TRI-ESTARYLLA) 0.18/0.215/0.25 MG-35 MCG tablet Take 1 tablet by mouth daily. 04/17/21  Yes [provider]  omeprazole  (PRILOSEC) 20 MG capsule Take 20 mg by mouth. 04/17/21  Yes [provider]  UNABLE TO FIND Med Name: Fungal Infection/Nail oil.   Yes [provider]  ALPRAZolam  (XANAX ) 0.5 MG tablet Take 1-2 tablets (0.5-1 mg total) by mouth 2 (two) times daily as needed for anxiety. Take sparingly. 07/06/24   Rhys Boyer T, PA-C  ALPRAZolam  (XANAX ) 1 MG tablet Take 1 mg by mouth 3 (three) times daily. 06/09/19   [provider]  amphetamine -dextroamphetamine  (ADDERALL XR) 30 MG 24 hr capsule Take 1 capsule (30 mg total) by mouth in the morning. 09/15/24   Rhys Boyer T, PA-C  amphetamine -dextroamphetamine  (ADDERALL XR) 30 MG 24 hr capsule Take 1 capsule (30 mg total) by mouth daily. 07/18/24   Rhys Boyer T, PA-C  amphetamine -dextroamphetamine  (ADDERALL) 10 MG tablet Take 1 tablet (10 mg total) by mouth 2 (two) times daily with a meal. Patient taking differently: Take 10 mg by mouth 2 (two) times daily with a meal. 1 daily prn 04/04/24   Rhys Boyer T, PA-C  amphetamine -dextroamphetamine  (ADDERALL) 10 MG tablet 1 p.o. at lunch and 1 p.o. in the late afternoon. Patient not taking: Reported on 04/11/2024 06/12/24   Rhys Boyer T, PA-C  amphetamine -dextroamphetamine  (ADDERALL) 10 MG tablet Take 1 tablet (10 mg total) by mouth 2 (two) times daily with a meal. 05/13/24   Rhys Boyer T, PA-C  norgestimate -ethinyl estradiol  (ORTHO-CYCLEN) 0.25-35 MG-MCG tablet TAKE 1 TABLET BY MOUTH EVERY DAY 07/03/24   Jason Leita Repine, FNP  pantoprazole  (PROTONIX ) 40 MG tablet TAKE 1 TABLET BY MOUTH EVERY DAY 06/20/24   Jason Leita Repine, FNP  tiZANidine  (ZANAFLEX ) 4 MG tablet Take 1 tablet (4 mg total) by mouth 2  (two) times daily as needed (hip pain). 05/08/24   Jason Leita Repine, FNP  traZODone  (DESYREL ) 50 MG tablet Take 1 tablet (50 mg total) by mouth at bedtime. 07/06/24   Rhys Boyer T, PA-C  triamcinolone  cream (KENALOG ) 0.5 % Apply 1 Application topically 3 (three) times daily.    [provider]    Family History Family History  Problem Relation Age of Onset   GER disease Mother    Hypertension Mother    Arthritis Mother    GER disease Father    Hypertension Father    Healthy Sister    Healthy Brother    Lactose intolerance Brother    Diabetes Paternal Uncle    Hypertension Maternal Grandfather    Diabetes Maternal Grandfather    Stroke Maternal Grandfather    Dementia Maternal Grandmother    Healthy Paternal Grandmother    Cancer Neg Hx    Depression Neg Hx    Drug abuse Neg Hx    Early death Neg Hx    Heart disease  Neg Hx    Hyperlipidemia Neg Hx    Kidney disease Neg Hx    Alcohol abuse Neg Hx    Asthma Neg Hx     Social History Social History   Tobacco Use   Smoking status: Never   Smokeless tobacco: Never  Vaping Use   Vaping status: Former   Quit date: 04/18/2020   Substances: Nicotine  Substance Use Topics   Alcohol use: No   Drug use: No     Allergies   Cymbalta  [duloxetine  hcl], Guanfacine, Nortriptyline , and Other   Review of Systems Review of Systems Per HPI  Physical Exam Triage Vital Signs ED Triage Vitals  Encounter Vitals Group     BP 07/18/24 1334 (!) 137/94     Girls Systolic BP Percentile --      Girls Diastolic BP Percentile --      Boys Systolic BP Percentile --      Boys Diastolic BP Percentile --      Pulse Rate 07/18/24 1334 92     Resp 07/18/24 1334 18     Temp 07/18/24 1334 98 F (36.7 C)     Temp Source 07/18/24 1334 Oral     SpO2 07/18/24 1334 96 %     Weight 07/18/24 1330 155 lb (70.3 kg)     Height 07/18/24 1330 5' 4 (1.626 m)     Head Circumference --      Peak Flow --      Pain Score 07/18/24 1327  0     Pain Loc --      Pain Education --      Exclude from Growth Chart --    No data found.  Updated Vital Signs BP (!) 137/94 (BP Location: Left Arm)   Pulse 92   Temp 98 F (36.7 C) (Oral)   Resp 18   Ht 5' 4 (1.626 m)   Wt 155 lb (70.3 kg)   LMP 07/12/2024 (Exact Date)   SpO2 96%   BMI 26.61 kg/m   Visual Acuity Right Eye Distance:   Left Eye Distance:   Bilateral Distance:    Right Eye Near:   Left Eye Near:    Bilateral Near:     Physical Exam Vitals and nursing note reviewed.  Constitutional:      Appearance: She is not ill-appearing or toxic-appearing.  HENT:     Head: Normocephalic and atraumatic.     Right Ear: Hearing and external ear normal.     Left Ear: Hearing and external ear normal.     Nose: Nose normal.     Mouth/Throat:     Lips: Pink.  Eyes:     General: Lids are normal. Vision grossly intact. Gaze aligned appropriately.     Extraocular Movements: Extraocular movements intact.     Conjunctiva/sclera: Conjunctivae normal.  Pulmonary:     Effort: Pulmonary effort is normal.  Musculoskeletal:     Cervical back: Neck supple.  Skin:    General: Skin is warm and dry.     Capillary Refill: Capillary refill takes less than 2 seconds.     Findings: No rash.  Neurological:     General: No focal deficit present.     Mental Status: She is alert and oriented to person, place, and time. Mental status is at baseline.     Cranial Nerves: No dysarthria or facial asymmetry.  Psychiatric:        Mood and Affect: Mood normal.  Speech: Speech normal.        Behavior: Behavior normal.        Thought Content: Thought content normal.        Judgment: Judgment normal.      UC Treatments / Results  Labs (all labs ordered are listed, but only abnormal results are displayed) Labs Reviewed - No data to display  EKG   Radiology No results found.  Procedures Procedures (including critical care time)  Medications Ordered in UC Medications -  No data to display  Initial Impression / Assessment and Plan / UC Course  I have reviewed the triage vital signs and the nursing notes.  Pertinent labs & imaging results that were available during my care of the patient were reviewed by me and considered in my medical decision making (see chart for details).     *** Final Clinical Impressions(s) / UC Diagnoses   Final diagnoses:  Onychomycosis  Nail problem     Discharge Instructions      You have a fungal infection of your nail.  Use clotrimazole  every 12 hours for the next 2 weeks.  Allow the nail to continue to grow out.   If you notice redness, swelling, drainage, or warmth to the tip of the finger, please come back to urgent care.   If you develop any new or worsening symptoms or if your symptoms do not start to improve, please return here or follow-up with your primary care provider. If your symptoms are severe, please go to the emergency room.   ED Prescriptions     Medication Sig Dispense Auth. Provider   clotrimazole  (LOTRIMIN ) 1 % cream Apply to affected area 2 times daily 15 g Enedelia Dorna HERO, FNP      PDMP not reviewed this encounter.

## 2024-07-24 ENCOUNTER — Encounter

## 2024-07-28 ENCOUNTER — Other Ambulatory Visit: Payer: Self-pay | Admitting: Physician Assistant

## 2024-07-31 ENCOUNTER — Encounter

## 2024-08-01 ENCOUNTER — Other Ambulatory Visit: Payer: Self-pay | Admitting: Physician Assistant

## 2024-08-07 ENCOUNTER — Encounter

## 2024-08-17 ENCOUNTER — Other Ambulatory Visit: Payer: Self-pay

## 2024-08-17 ENCOUNTER — Ambulatory Visit
Admission: RE | Admit: 2024-08-17 | Discharge: 2024-08-17 | Disposition: A | Discharge status: Home / Self Care | Source: Ambulatory Visit | Attending: Family Medicine | Admitting: Family Medicine

## 2024-08-17 VITALS — BP 164/118 | HR 97 | Temp 98.1°F | Resp 16

## 2024-08-17 DIAGNOSIS — M545 Low back pain, unspecified: Secondary | ICD-10-CM | POA: Diagnosis not present

## 2024-08-17 MED ORDER — TIZANIDINE HCL 6 MG PO CAPS: 6.0000 mg | ORAL_CAPSULE | Freq: Three times a day (TID) | ORAL | 0 refills | Status: DC | PRN

## 2024-08-17 NOTE — ED Triage Notes (Addendum)
 Back Pain (Lower back pain that radiates to right hip - Entered by patient)    Pt reports 1 week of right side low back pain. States she has had this before and tizanidine  has helped. Unsure what has caused it to flare up. She has been taking tylenol  for the pain. Denies urinary/vaginal symptoms

## 2024-08-17 NOTE — ED Provider Notes (Signed)
 [facility]   [id number] 08/17/24 Arrival Time: 1452  ASSESSMENT & PLAN:  1. Acute right-sided low back pain without sciatica    Able to ambulate here and hemodynamically stable. No indication for imaging of back at this time given no trauma and normal neurological exam.   Meds ordered this encounter  Medications   tizanidine  (ZANAFLEX ) 6 MG capsule    Sig: Take 1 capsule (6 mg total) by mouth 3 (three) times daily as needed for muscle spasms.    Dispense:  30 capsule    Refill:  0   Work/school excuse note: not needed. Medication sedation precautions given. Encourage ROM/movement as tolerated.  Recommend:  Follow-up Information     [facility]).   Specialty: Urgent Care Why: As needed. Contact information: [address] [place]  [address] [phone]                Reviewed expectations re: course of current medical issues. Questions answered. Outlined signs and symptoms indicating need for more acute intervention. Patient verbalized understanding. After Visit Summary given.   SUBJECTIVE: History from: patient.  Stacey Holland is a 28 y.o. female who presents with complaint of intermittent right sided lower back discomfort; into buttock. H/O similar in past responding to Zanaflex  6mg ; requests refill. Denies extremity sensation changes or weakness. Normal bowel/bladder habits. Normal ambulation.   OBJECTIVE:  Vitals:   08/17/24 1523  BP: (!) 164/118  Pulse: 97  Resp: 16  Temp: 98.1 F (36.7 C)  TempSrc: Oral  SpO2: 99%    General appearance: alert; no distress Back: benign; describes pain over R lumbar paraspinal musculature into R buttock Extremities: without edema; symmetrical without gross deformities; normal ROM of bilateral LE Skin: warm and dry Psychological: alert and cooperative; normal mood and affect  Labs:  Labs Reviewed - No data to  display  Imaging: No results found.  Allergies  Allergen Reactions   Cymbalta  [Duloxetine  Hcl] Other (See Comments)    Elevated blood pressure   Guanfacine Other (See Comments)    Hallucinations  Hallucinations, Hallucinations   Nortriptyline  Other (See Comments)    Reports hallucinations   Other Itching, Swelling, Rash and Other (See Comments)    Reaction:  Burning Pt states that she is allergic to all hair dye.      Past Medical History:  Diagnosis Date   Anorexia nervosa (HCC)    Anorexia nervosa (HCC)    Chronic idiopathic constipation 12/01/2017   Dyspnea    breathing heavyeven when sitting, lying dpwn,exertion   Essential hypertension 02/20/2015   GERD (gastroesophageal reflux disease)    Headache    History of blood transfusion    prematurity, this was during perinatal period   Hypertension    Morbid obesity (HCC) 04/03/2016   Obstructive sleep apnea syndrome 01/14/2018   PCOS (polycystic ovarian syndrome) 04/03/2016   Reflux    Syncope    Tendinitis, de Quervain's 04/18/2024   Vomiting associated with bulimia nervosa with nausea (HCC) 06/22/2017   Social History   Socioeconomic History   Marital status: Single    Spouse name: Not on file   Number of children: 0   Years of education: college   Highest education level: Associate degree: academic program  Occupational History   Occupation: college student  Tobacco Use   Smoking status: Never   Smokeless tobacco: Never  Vaping Use   Vaping status: Former   Quit date: 04/18/2020  Substances: Nicotine  Substance and Sexual Activity   Alcohol use: No   Drug use: No   Sexual activity: Never    Birth control/protection: Pill, Abstinence  Other Topics Concern   Not on file  Social History Narrative   Studying liberal arts at [employer] .   lives at home with family. With sister and bil      Caffeine 0-1 cups   Legal-none   Religious- Islam       Hobbies- watches tv, movies   Introvert but is  extroverted when she needs to be   Social Drivers of Corporate investment banker Strain: Not on file  Food Insecurity: Not on file  Transportation Needs: Not on file  Physical Activity: Not on file  Stress: Not on file  Social Connections: Not on file  Intimate Partner Violence: Not on file   Family History  Problem Relation Age of Onset   GER disease Mother    Hypertension Mother    Arthritis Mother    GER disease Father    Hypertension Father    Healthy Sister    Healthy Brother    Lactose intolerance Brother    Diabetes Paternal Uncle    Hypertension Maternal Grandfather    Diabetes Maternal Grandfather    Stroke Maternal Grandfather    Dementia Maternal Grandmother    Healthy Paternal Grandmother    Cancer Neg Hx    Depression Neg Hx    Drug abuse Neg Hx    Early death Neg Hx    Heart disease Neg Hx    Hyperlipidemia Neg Hx    Kidney disease Neg Hx    Alcohol abuse Neg Hx    Asthma Neg Hx    Past Surgical History:  Procedure Laterality Date   CHOLECYSTECTOMY N/A 11/25/2016   Procedure: LAPAROSCOPIC CHOLECYSTECTOMY;  Surgeon: Lynda Leos, MD;  Location: [facility] OR;  Service: General;  Laterality: N/A;   ESOPHAGOGASTRODUODENOSCOPY N/A 09/26/2016   Procedure: ESOPHAGOGASTRODUODENOSCOPY (EGD);  Surgeon: Gordy CHRISTELLA Starch, MD;  Location: [facility];  Service: Endoscopy;  Laterality: N/A;   eustachion tubes        Rolinda Rogue, MD 08/17/24 1557

## 2024-08-18 ENCOUNTER — Telehealth: Payer: Self-pay

## 2024-08-18 DIAGNOSIS — M545 Low back pain, unspecified: Secondary | ICD-10-CM

## 2024-08-18 NOTE — Telephone Encounter (Signed)
 Incoming call/information to review:  phone message.. mrn [phone] Stacey Holland seen yesterday as well is calling regarding her medication. [place] that the pharmacy needs 'approval' before she can pick it up. [phone]  Outgoing call/information reviewed:  No Answer. Will send MyChart message and await response.  WENDI Dixon CMA

## 2024-08-20 MED ORDER — CYCLOBENZAPRINE HCL 5 MG PO TABS: 5.0000 mg | ORAL_TABLET | Freq: Three times a day (TID) | ORAL | 0 refills | Status: DC

## 2024-08-20 NOTE — Addendum Note (Signed)
 Addended by: LUM HACKER B on: 08/20/2024 04:25 PM   Modules accepted: Orders

## 2024-08-20 NOTE — Telephone Encounter (Signed)
 Called and spoke with pharmacist who states the following:  The Rx:   tizanidine  (ZANAFLEX ) 6 MG capsule      Sig: Take 1 capsule (6 mg total) by mouth 3 (three) times daily as needed for muscle spasms.      Dispense:  30 capsule      Refill:  0   Needs a prior authorization. I informed him we do not complete PA's here in Urgent Care setting so I will forward to prescriber and provider on staff in clinic today for direction.  Redell HERO. CMA

## 2024-08-30 ENCOUNTER — Other Ambulatory Visit: Payer: Self-pay | Admitting: Medical Genetics

## 2024-08-30 ENCOUNTER — Telehealth: Admitting: Family Medicine

## 2024-08-30 DIAGNOSIS — M25551 Pain in right hip: Secondary | ICD-10-CM | POA: Diagnosis not present

## 2024-08-30 DIAGNOSIS — M545 Low back pain, unspecified: Secondary | ICD-10-CM

## 2024-08-30 MED ORDER — TIZANIDINE HCL 4 MG PO TABS: 4.0000 mg | ORAL_TABLET | Freq: Three times a day (TID) | ORAL | 0 refills | Status: DC | PRN

## 2024-08-30 NOTE — Progress Notes (Signed)
 Virtual Visit Consent   Stacey Holland, you are scheduled for a virtual visit with a [facility] provider today. Just as with appointments in the office, your consent must be obtained to participate. Your consent will be active for this visit and any virtual visit you may have with one of our providers in the next 365 days. If you have a MyChart account, a copy of this consent can be sent to you electronically.  As this is a virtual visit, video technology does not allow for your provider to perform a traditional examination. This may limit your provider's ability to fully assess your condition. If your provider identifies any concerns that need to be evaluated in person or the need to arrange testing (such as labs, EKG, etc.), we will make arrangements to do so. Although advances in technology are sophisticated, we cannot ensure that it will always work on either your end or our end. If the connection with a video visit is poor, the visit may have to be switched to a telephone visit. With either a video or telephone visit, we are not always able to ensure that we have a secure connection.  By engaging in this virtual visit, you consent to the provision of healthcare and authorize for your insurance to be billed (if applicable) for the services provided during this visit. Depending on your insurance coverage, you may receive a charge related to this service.  I need to obtain your verbal consent now. Are you willing to proceed with your visit today? [name] has provided verbal consent on 08/30/2024 for a virtual visit (video or telephone). [name], NP  Date: 08/30/2024 3:48 PM   Virtual Visit via Video Note   I, [name], connected with  Stacey Holland  ([id number], [birth year]) on 08/30/24 at  3:45 PM EDT by a video-enabled telemedicine application and verified that I am speaking with the correct person using two identifiers.  Location: Patient: Virtual Visit Location  Patient: Home Provider: Virtual Visit Location Provider: Home Office   I discussed the limitations of evaluation and management by telemedicine and the availability of in person appointments. The patient expressed understanding and agreed to proceed.    History of Present Illness: Stacey Holland is a 28 y.o. who identifies as a female who was assigned female at birth, and is being seen today for chronic back pain.  Chronic condition but flared up Oct 9th days. Injury or trauma related -previous old injury (needs MRI for follow up) is in PT as well right now.  Insurance not covering the 6 mg cap ordered on OCt 9th. Needs 4mg  tablet This pain is located in the back with radiation into the upper right hip. Pain score is rated 3-4/10. At its worse the pain is a level 7/10. It has started to disturb sleep and limiting movement. It is relieved by zanaflex  and rest It is aggravated by movement Modifying factors have included zanaflex  she had on hand, never got the 6 mg cap  There is no associated lower extremity numbness or weakness. There is no associated incontinence of stool or urine.  Problems:  Patient Active Problem List   Diagnosis Date Noted   De Quervain's tenosynovitis, right 04/18/2024   Chronic idiopathic constipation 07/03/2020   Fear of flying 06/09/2019   Panic anxiety syndrome 07/05/2018   Mixed anxiety and depressive disorder 04/27/2018   Gastroparesis 04/11/2018   Attention deficit hyperactivity disorder (ADHD) 01/14/2018   Vitamin D  deficiency  01/14/2018   Gastroesophageal reflux disease without esophagitis 01/14/2018   Eating disorder 01/14/2018   Attention and concentration deficit 12/01/2017   Bulimia (HCC) 12/01/2017   Routine general medical examination at a health care facility 08/26/2016   Polycystic ovaries 04/03/2016   GE reflux    Polycystic ovarian syndrome 12/19/2012    Allergies:  Allergies  Allergen Reactions   Cymbalta  [Duloxetine  Hcl] Other  (See Comments)    Elevated blood pressure   Guanfacine Other (See Comments)    Hallucinations  Hallucinations, Hallucinations   Nortriptyline  Other (See Comments)    Reports hallucinations   Other Itching, Swelling, Rash and Other (See Comments)    Reaction:  Burning Pt states that she is allergic to all hair dye.     Medications:  Current Outpatient Medications:    tiZANidine  (ZANAFLEX ) 4 MG tablet, Take 1 tablet (4 mg total) by mouth every 8 (eight) hours as needed for muscle spasms., Disp: 30 tablet, Rfl: 0   ALPRAZolam  (XANAX ) 0.5 MG tablet, Take 1-2 tablets (0.5-1 mg total) by mouth 2 (two) times daily as needed for anxiety. Take sparingly., Disp: 60 tablet, Rfl: 0   amphetamine -dextroamphetamine  (ADDERALL XR) 30 MG 24 hr capsule, Take 1 capsule (30 mg total) by mouth daily., Disp: 30 capsule, Rfl: 0   [START ON 09/15/2024] amphetamine -dextroamphetamine  (ADDERALL XR) 30 MG 24 hr capsule, Take 1 capsule (30 mg total) by mouth in the morning., Disp: 30 capsule, Rfl: 0   norgestimate -ethinyl estradiol  (ORTHO-CYCLEN) 0.25-35 MG-MCG tablet, TAKE 1 TABLET BY MOUTH EVERY DAY, Disp: 84 tablet, Rfl: 0   Norgestimate -Ethinyl Estradiol  Triphasic (TRI-ESTARYLLA) 0.18/0.215/0.25 MG-35 MCG tablet, Take 1 tablet by mouth daily., Disp: , Rfl:    pantoprazole  (PROTONIX ) 40 MG tablet, TAKE 1 TABLET BY MOUTH EVERY DAY, Disp: 90 tablet, Rfl: 0   traZODone  (DESYREL ) 50 MG tablet, TAKE 1 TABLET BY MOUTH EVERYDAY AT BEDTIME, Disp: 90 tablet, Rfl: 0  Observations/Objective: Patient is well-developed, well-nourished in no acute distress.  Resting comfortably  at home.  Head is normocephalic, atraumatic.  No labored breathing.  Speech is clear and coherent with logical content.  Patient is alert and oriented at baseline.    Assessment and Plan:  1. Pain of right hip (Primary)  - tiZANidine  (ZANAFLEX ) 4 MG tablet; Take 1 tablet (4 mg total) by mouth every 8 (eight) hours as needed for muscle spasms.   Dispense: 30 tablet; Refill: 0  2. Acute right-sided low back pain, unspecified whether sciatica present  - tiZANidine  (ZANAFLEX ) 4 MG tablet; Take 1 tablet (4 mg total) by mouth every 8 (eight) hours as needed for muscle spasms.  Dispense: 30 tablet; Refill: 0  -follow up with MRI plan  -follow up with PCP as well for on going needs -no red flags today- but reviewed in detail  -strict in person precautions     Reviewed side effects, risks and benefits of medication.    Patient acknowledged agreement and understanding of the plan.   Past Medical, Surgical, Social History, Allergies, and Medications have been Reviewed.    Follow Up Instructions: I discussed the assessment and treatment plan with the patient. The patient was provided an opportunity to ask questions and all were answered. The patient agreed with the plan and demonstrated an understanding of the instructions.  A copy of instructions were sent to the patient via MyChart unless otherwise noted below.    The patient was advised to call back or seek an in-person evaluation if the symptoms worsen or  if the condition fails to improve as anticipated.    [name], NP

## 2024-08-30 NOTE — Patient Instructions (Addendum)
 Stacey Holland, thank you for joining [name], NP for today's virtual visit.  While this provider is not your primary care provider (PCP), if your PCP is located in our provider database this encounter information will be shared with them immediately following your visit.   A [facility] MyChart account gives you access to today's visit and all your visits, tests, and labs performed at [facility]  click here if you don't have a [facility] MyChart account or go to [email or web address]  Consent: (Patient) Stacey JENEANE Signer provided verbal consent for this virtual visit at the beginning of the encounter.  Current Medications:  Current Outpatient Medications:    tiZANidine  (ZANAFLEX ) 4 MG tablet, Take 1 tablet (4 mg total) by mouth every 8 (eight) hours as needed for muscle spasms., Disp: 30 tablet, Rfl: 0   ALPRAZolam  (XANAX ) 0.5 MG tablet, Take 1-2 tablets (0.5-1 mg total) by mouth 2 (two) times daily as needed for anxiety. Take sparingly., Disp: 60 tablet, Rfl: 0   amphetamine -dextroamphetamine  (ADDERALL XR) 30 MG 24 hr capsule, Take 1 capsule (30 mg total) by mouth daily., Disp: 30 capsule, Rfl: 0   [START ON 09/15/2024] amphetamine -dextroamphetamine  (ADDERALL XR) 30 MG 24 hr capsule, Take 1 capsule (30 mg total) by mouth in the morning., Disp: 30 capsule, Rfl: 0   norgestimate -ethinyl estradiol  (ORTHO-CYCLEN) 0.25-35 MG-MCG tablet, TAKE 1 TABLET BY MOUTH EVERY DAY, Disp: 84 tablet, Rfl: 0   Norgestimate -Ethinyl Estradiol  Triphasic (TRI-ESTARYLLA) 0.18/0.215/0.25 MG-35 MCG tablet, Take 1 tablet by mouth daily., Disp: , Rfl:    pantoprazole  (PROTONIX ) 40 MG tablet, TAKE 1 TABLET BY MOUTH EVERY DAY, Disp: 90 tablet, Rfl: 0   traZODone  (DESYREL ) 50 MG tablet, TAKE 1 TABLET BY MOUTH EVERYDAY AT BEDTIME, Disp: 90 tablet, Rfl: 0   Medications ordered in this encounter:  Meds ordered this encounter  Medications   tiZANidine  (ZANAFLEX ) 4 MG tablet    Sig: Take 1 tablet (4 mg  total) by mouth every 8 (eight) hours as needed for muscle spasms.    Dispense:  30 tablet    Refill:  0    Supervising Provider:   LAMPTEY, PHILIP O [[id number]]     *If you need refills on other medications prior to your next appointment, please contact your pharmacy*  Follow-Up: Call back or seek an in-person evaluation if the symptoms worsen or if the condition fails to improve as anticipated.  [facility] [phone]  Other Instructions  Chronic Back Pain Chronic back pain is back pain that lasts longer than 3 months. The pain may get worse at certain times (flare-ups). There are things you can do at home to manage your pain. Follow these instructions at home: Watch for any changes in your symptoms. Take these actions to help with your pain: Managing pain and stiffness     If told, put ice on the painful area. You may be told to use ice for 24-48 hours after a flare-up starts. Put ice in a plastic bag. Place a towel between your skin and the bag. Leave the ice on for 20 minutes, 2-3 times a day. If told, put heat on the painful area. Do this as often as told by your doctor. Use the heat source that your doctor recommends, such as a moist heat pack or a heating pad. Place a towel between your skin and the heat source. Leave the heat on for 20-30 minutes. If your skin turns bright red, take off the ice or  heat right away to prevent skin damage. The risk of damage is higher if you cannot feel pain, heat, or cold. Soak in a warm bath. This can help with pain. Activity        Avoid bending and other activities that make the pain worse. When you stand: Keep your upper back and neck straight. Keep your shoulders pulled back. Avoid slouching. When you sit: Keep your back straight. Relax your shoulders. Do not round your shoulders or pull them backward. Do not sit or stand in one place for too long. Take short rest breaks during the day. Lying down or standing  is often better than sitting. Resting can help relieve pain. When sitting or lying down for a long time, do some mild activity or stretching. This will help to prevent stiffness and pain. Get regular exercise. Ask your doctor what activities are safe for you. You may have to avoid lifting. Ask your provider how much you can safely lift. If you lift things: Bend your knees. Keep the weight close to your body. Avoid twisting. Medicines Take over-the-counter and prescription medicines only as told by your doctor. You may need to take medicines for pain and swelling. These may be taken by mouth or put on the skin. You may also be given muscle relaxants. Ask your doctor if the medicine prescribed to you: Requires you to avoid driving or using machinery. Can cause trouble pooping (constipation). You may need to take these actions to prevent or treat trouble pooping: Drink enough fluid to keep your pee (urine) pale yellow. Take over-the-counter or prescription medicines. Eat foods that are high in fiber. These include beans, whole grains, and fresh fruits and vegetables. Limit foods that are high in fat and sugars. These include fried or sweet foods. General instructions  Sleep on a firm mattress. Try lying on your side with your knees slightly bent. If you lie on your back, put a pillow under your knees. Do not smoke or use any products that contain nicotine or tobacco. If you need help quitting, ask your doctor. Contact a doctor if: Your pain does not get better with rest or medicine. You have new pain. You have a fever. You lose weight quickly. You have trouble doing your normal activities. One or both of your legs or feet feel weak. One or both of your legs or feet lose feeling (have numbness). Get help right away if: You are not able to control when you pee or poop. You have bad back pain and: You feel like you may vomit (nauseous). You vomit. You have pain in your chest or your  belly (abdomen). You have shortness of breath. You faint. These symptoms may be an emergency. Get help right away. Call 911. Do not wait to see if the symptoms will go away. Do not drive yourself to the hospital. This information is not intended to replace advice given to you by your health care provider. Make sure you discuss any questions you have with your health care provider. Document Revised: 06/15/2022 Document Reviewed: 06/15/2022 Elsevier Patient Education  2024 Elsevier Inc.   If you have been instructed to have an in-person evaluation today at a local Urgent Care facility, please use the link below. It will take you to a list of all of our available [facility], including address, phone number and hours of operation. Please do not delay care.  [facility]  If you or a family member do not have  a primary care provider, use the link below to schedule a visit and establish care. When you choose a [facility] primary care physician or advanced practice provider, you gain a long-term partner in health. Find a Primary Care Provider  Learn more about [facility]'s in-office and virtual care options: [facility] - Get Care Now

## 2024-09-05 ENCOUNTER — Ambulatory Visit (INDEPENDENT_AMBULATORY_CARE_PROVIDER_SITE_OTHER): Admitting: Family

## 2024-09-05 ENCOUNTER — Encounter: Payer: Self-pay | Admitting: Family

## 2024-09-05 VITALS — BP 139/88 | HR 89 | Temp 97.2°F | Resp 16 | Ht 64.0 in | Wt 168.8 lb

## 2024-09-05 DIAGNOSIS — F418 Other specified anxiety disorders: Secondary | ICD-10-CM

## 2024-09-05 DIAGNOSIS — Z13228 Encounter for screening for other metabolic disorders: Secondary | ICD-10-CM

## 2024-09-05 DIAGNOSIS — F419 Anxiety disorder, unspecified: Secondary | ICD-10-CM

## 2024-09-05 DIAGNOSIS — Z0000 Encounter for general adult medical examination without abnormal findings: Secondary | ICD-10-CM | POA: Diagnosis not present

## 2024-09-05 DIAGNOSIS — F909 Attention-deficit hyperactivity disorder, unspecified type: Secondary | ICD-10-CM

## 2024-09-05 DIAGNOSIS — Z1329 Encounter for screening for other suspected endocrine disorder: Secondary | ICD-10-CM

## 2024-09-05 DIAGNOSIS — Z5320 Procedure and treatment not carried out because of patient's decision for unspecified reasons: Secondary | ICD-10-CM

## 2024-09-05 DIAGNOSIS — Z131 Encounter for screening for diabetes mellitus: Secondary | ICD-10-CM

## 2024-09-05 DIAGNOSIS — Z130 Encounter for screening for diseases of the blood and blood-forming organs and certain disorders involving the immune mechanism: Secondary | ICD-10-CM

## 2024-09-05 DIAGNOSIS — Z13220 Encounter for screening for lipoid disorders: Secondary | ICD-10-CM

## 2024-09-05 DIAGNOSIS — Z7689 Persons encountering health services in other specified circumstances: Secondary | ICD-10-CM

## 2024-09-05 NOTE — Progress Notes (Signed)
 Patient scored a 9 on the PHQ-9.

## 2024-09-05 NOTE — Progress Notes (Signed)
 Subjective:    Stacey Holland - 28 y.o. female MRN [id number]  Date of birth: 1996/10/14  HPI  Stacey Holland is to establish care and annual physical exam.  Current issues and/or concerns: - Declined cervical cancer screening. - States anxiety depression related to ADHD. Reports she is established with Psychiatry for medication management. She denies thoughts of self-harm, suicidal ideations, homicidal ideations.   ROS per HPI    Health Maintenance:  Health Maintenance Due  Topic Date Due   Cervical Cancer Screening (Pap smear)  04/29/2020     Past Medical History: Patient Active Problem List   Diagnosis Date Noted   De Quervain's tenosynovitis, right 04/18/2024   Chronic idiopathic constipation 07/03/2020   Fear of flying 06/09/2019   Panic anxiety syndrome 07/05/2018   Mixed anxiety and depressive disorder 04/27/2018   Gastroparesis 04/11/2018   Attention deficit hyperactivity disorder (ADHD) 01/14/2018   Vitamin D  deficiency 01/14/2018   Gastroesophageal reflux disease without esophagitis 01/14/2018   Eating disorder 01/14/2018   Attention and concentration deficit 12/01/2017   Bulimia (HCC) 12/01/2017   Routine general medical examination at a health care facility 08/26/2016   Polycystic ovaries 04/03/2016   GE reflux    Polycystic ovarian syndrome 12/19/2012      Social History   reports that she has never smoked. She has never used smokeless tobacco. She reports that she does not drink alcohol and does not use drugs.   Family History  family history includes Arthritis in her mother; Dementia in her maternal grandmother; Diabetes in her maternal grandfather and paternal uncle; GER disease in her father and mother; Healthy in her brother, paternal grandmother, and sister; Hypertension in her father, maternal grandfather, and mother; Lactose intolerance in her brother; Stroke in her maternal grandfather.   Medications: reviewed and updated   Objective:    Physical Exam BP 139/88   Pulse 89   Temp (!) 97.2 F (36.2 C) (Oral)   Resp 16   Ht 5' 4 (1.626 m)   Wt 168 lb 12.8 oz (76.6 kg)   LMP 08/10/2024 (Exact Date)   SpO2 99%   BMI 28.97 kg/m   Physical Exam HENT:     Head: Normocephalic and atraumatic.     Right Ear: Tympanic membrane, ear canal and external ear normal.     Left Ear: Tympanic membrane, ear canal and external ear normal.     Nose: Nose normal.     Mouth/Throat:     Mouth: Mucous membranes are moist.     Pharynx: Oropharynx is clear.  Eyes:     Extraocular Movements: Extraocular movements intact.     Conjunctiva/sclera: Conjunctivae normal.     Pupils: Pupils are equal, round, and reactive to light.  Neck:     Thyroid : No thyroid  mass, thyromegaly or thyroid  tenderness.  Cardiovascular:     Rate and Rhythm: Normal rate and regular rhythm.     Pulses: Normal pulses.     Heart sounds: Normal heart sounds.  Pulmonary:     Effort: Pulmonary effort is normal.     Breath sounds: Normal breath sounds.  Chest:     Comments: Patient declined. Abdominal:     General: Bowel sounds are normal.     Palpations: Abdomen is soft.  Genitourinary:    Comments: Patient declined. Musculoskeletal:        General: Normal range of motion.     Right shoulder: Normal.     Left shoulder: Normal.  Right upper arm: Normal.     Left upper arm: Normal.     Right elbow: Normal.     Left elbow: Normal.     Right forearm: Normal.     Left forearm: Normal.     Right wrist: Normal.     Left wrist: Normal.     Right hand: Normal.     Left hand: Normal.     Cervical back: Normal, normal range of motion and neck supple.     Thoracic back: Normal.     Lumbar back: Normal.     Right hip: Normal.     Left hip: Normal.     Right upper leg: Normal.     Left upper leg: Normal.     Right knee: Normal.     Left knee: Normal.     Right lower leg: Normal.     Left lower leg: Normal.     Right ankle: Normal.     Left ankle: Normal.      Right foot: Normal.     Left foot: Normal.  Skin:    General: Skin is warm and dry.     Capillary Refill: Capillary refill takes less than 2 seconds.  Neurological:     General: No focal deficit present.     Mental Status: She is alert and oriented to person, place, and time.  Psychiatric:        Mood and Affect: Mood normal.        Behavior: Behavior normal.       Assessment & Plan:  1. Encounter to establish care (Primary) 2. Annual physical exam - Counseled on 150 minutes of exercise per week as tolerated, healthy eating (including decreased daily intake of saturated fats, cholesterol, added sugars, sodium), STI prevention, and routine healthcare maintenance.  3. Screening for metabolic disorder - Routine screening.  - CMP14+EGFR  4. Screening for deficiency anemia - Routine screening.  - CBC  5. Diabetes mellitus screening - Routine screening.  - Hemoglobin A1c  6. Screening cholesterol level - Routine screening.  - Lipid panel  7. Thyroid  disorder screen - Routine screening.  - TSH  8. Cervical cancer screening declined - Patient declined.   9. Anxiety and depression 10. Attention deficit hyperactivity disorder (ADHD), unspecified ADHD type - Patient denies thoughts of self-harm, suicidal ideations, homicidal ideations. - Continue present management.  - Keep all scheduled appointments with established Psychiatry.     Patient was given clear instructions to go to Emergency Department or return to medical center if symptoms don't improve, worsen, or new problems develop.The patient verbalized understanding.  I discussed the assessment and treatment plan with the patient. The patient was provided an opportunity to ask questions and all were answered. The patient agreed with the plan and demonstrated an understanding of the instructions.   The patient was advised to call back or seek an in-person evaluation if the symptoms worsen or if the condition fails  to improve as anticipated.    Greig Drones, NP 09/05/2024, 3:09 PM Primary Care at [facility]

## 2024-09-06 ENCOUNTER — Ambulatory Visit: Payer: Self-pay | Admitting: Family

## 2024-09-06 ENCOUNTER — Other Ambulatory Visit: Payer: Self-pay

## 2024-09-06 ENCOUNTER — Telehealth: Payer: Self-pay | Admitting: Physician Assistant

## 2024-09-06 DIAGNOSIS — F902 Attention-deficit hyperactivity disorder, combined type: Secondary | ICD-10-CM

## 2024-09-06 DIAGNOSIS — Z13220 Encounter for screening for lipoid disorders: Secondary | ICD-10-CM

## 2024-09-06 LAB — LIPID PANEL
Chol/HDL Ratio: 2.6 ratio (ref 0.0–4.4)
Cholesterol, Total: 201 mg/dL — ABNORMAL HIGH (ref 100–199)
HDL: 76 mg/dL (ref >39)
LDL Chol Calc (NIH): 113 mg/dL — ABNORMAL HIGH (ref 0–99)
Triglycerides: 68 mg/dL (ref 0–149)
VLDL Cholesterol Cal: 12 mg/dL (ref 5–40)

## 2024-09-06 LAB — CMP14+EGFR
ALT: 13 IU/L (ref 0–32)
AST: 13 IU/L (ref 0–40)
Albumin: 3.7 g/dL — ABNORMAL LOW (ref 4.0–5.0)
Alkaline Phosphatase: 58 IU/L (ref 41–116)
BUN/Creatinine Ratio: 16 (ref 9–23)
BUN: 13 mg/dL (ref 6–20)
Bilirubin Total: <0.2 mg/dL (ref 0.0–1.2)
CO2: 20 mmol/L (ref 20–29)
Calcium: 9.3 mg/dL (ref 8.7–10.2)
Chloride: 102 mmol/L (ref 96–106)
Creatinine, Ser: 0.81 mg/dL (ref 0.57–1.00)
Globulin, Total: 2.9 g/dL (ref 1.5–4.5)
Glucose: 78 mg/dL (ref 70–99)
Potassium: 4.3 mmol/L (ref 3.5–5.2)
Sodium: 137 mmol/L (ref 134–144)
Total Protein: 6.6 g/dL (ref 6.0–8.5)
eGFR: 101 mL/min/1.73 (ref >59)

## 2024-09-06 LAB — HEMOGLOBIN A1C
Est. average glucose Bld gHb Est-mCnc: 91 mg/dL
Hgb A1c MFr Bld: 4.8 % (ref 4.8–5.6)

## 2024-09-06 LAB — CBC
Hematocrit: 39 % (ref 34.0–46.6)
Hemoglobin: 12.6 g/dL (ref 11.1–15.9)
MCH: 26.4 pg — ABNORMAL LOW (ref 26.6–33.0)
MCHC: 32.3 g/dL (ref 31.5–35.7)
MCV: 82 fL (ref 79–97)
Platelets: 308 x10E3/uL (ref 150–450)
RBC: 4.78 x10E6/uL (ref 3.77–5.28)
RDW: 12.2 % (ref 11.7–15.4)
WBC: 5.9 x10E3/uL (ref 3.4–10.8)

## 2024-09-06 LAB — TSH: TSH: 2.39 u[IU]/mL (ref 0.450–4.500)

## 2024-09-06 MED ORDER — AMPHETAMINE-DEXTROAMPHET ER 30 MG PO CP24: 30.0000 mg | ORAL_CAPSULE | Freq: Every day | ORAL | 0 refills | Status: DC

## 2024-09-06 MED ORDER — AMPHETAMINE-DEXTROAMPHET ER 30 MG PO CP24: 30.0000 mg | ORAL_CAPSULE | Freq: Every morning | ORAL | 0 refills | Status: DC

## 2024-09-06 NOTE — Telephone Encounter (Signed)
 Pt called at 1:58p requesting refill of Adderall. She said she didn't pick up the script for October.  I told her it should be available.  She said the app says it's too early for the refill.  I told her to call back an speak to a person, but she asked if you would call as well.  Confirmed pharmacy is [facility] [address]  Next appt 12/1

## 2024-09-06 NOTE — Telephone Encounter (Signed)
 LF 9/9, has a RF available for Oct and November at the requested pharmacy.

## 2024-09-06 NOTE — Telephone Encounter (Signed)
 Called pharmacy to see what the issue was. Pt had requested Sandos brand and it was not available. Called her and she was ok to get what was available. Rx also needed dx code, which was added and repended for provider approval.

## 2024-09-11 ENCOUNTER — Encounter: Payer: Self-pay | Admitting: Radiology

## 2024-10-09 ENCOUNTER — Ambulatory Visit: Admitting: Physician Assistant

## 2024-10-09 NOTE — Progress Notes (Signed)
 No show

## 2024-10-10 ENCOUNTER — Encounter: Payer: Self-pay | Admitting: Physician Assistant

## 2024-10-10 ENCOUNTER — Ambulatory Visit: Admitting: Physician Assistant

## 2024-10-10 DIAGNOSIS — F411 Generalized anxiety disorder: Secondary | ICD-10-CM

## 2024-10-10 DIAGNOSIS — F32A Depression, unspecified: Secondary | ICD-10-CM

## 2024-10-10 DIAGNOSIS — F40243 Fear of flying: Secondary | ICD-10-CM

## 2024-10-10 DIAGNOSIS — F902 Attention-deficit hyperactivity disorder, combined type: Secondary | ICD-10-CM | POA: Diagnosis not present

## 2024-10-10 MED ORDER — AMPHETAMINE-DEXTROAMPHET ER 30 MG PO CP24: 30.0000 mg | ORAL_CAPSULE | Freq: Every morning | ORAL | 0 refills | Status: AC

## 2024-10-10 MED ORDER — AMPHETAMINE-DEXTROAMPHET ER 30 MG PO CP24: 30.0000 mg | ORAL_CAPSULE | Freq: Every day | ORAL | 0 refills | Status: AC

## 2024-10-10 MED ORDER — AMPHETAMINE-DEXTROAMPHETAMINE 10 MG PO TABS: 10.0000 mg | ORAL_TABLET | Freq: Every day | ORAL | 0 refills | Status: AC

## 2024-10-10 MED ORDER — ALPRAZOLAM 0.5 MG PO TABS: 0.5000 mg | ORAL_TABLET | Freq: Two times a day (BID) | ORAL | 0 refills | Status: AC | PRN

## 2024-10-10 NOTE — Progress Notes (Signed)
 [facility]  Patient ID: Stacey Holland,  MRN: [id number]  PCP: Jason Leita Repine, FNP (Inactive)  Date of Evaluation: 10/10/2024 Time spent:20 minutes  Chief Complaint:  Chief Complaint   ADD; Anxiety    HISTORY/CURRENT STATUS: HPI for routine med check.  Doing well. Meds are effective. She was able to get the Adderall XR 30 mg for a few weeks and had to resort to the leftover 10 mg.  It wasn't as effective but it helped a little.  Glad to be back to usual dose.  States that attention is good without easy distractibility.  Able to focus on things and finish tasks to completion.  She sometimes takes the 10 mg in the late afternoon or evening if she needs to focus more but it is not an everyday thing.  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 She is able to enjoy things.  Energy and motivation are good.  No extreme sadness, tearfulness, or feelings of hopelessness.  Sleeps well most of the time. ADLs and personal hygiene are normal.   Appetite has not changed.  Weight is stable.  No mania, delirium, AH/VH.  No SI/HI.  Individual Medical History/ Review of Systems: Changes? :No   Past medications for mental health diagnoses include: Xanax  for fear of flying, Klonopin , Ativan , BuSpar , nortriptyline , Celexa , Lexapro, trazodone , gabapentin, Lamictal,  guanfacine, Adderall, Cymbalta  caused extremely high BP, hydroxyzine  caused her to feel funny   Eating Disorder, Anorexia/bulemia about 4 years ago, went to inpatient treatment [address].  Allergies: Cymbalta  [duloxetine  hcl], Guanfacine, Nortriptyline , and Other  Current Medications:  Current Outpatient Medications:    amphetamine -dextroamphetamine  (ADDERALL XR) 30 MG 24 hr capsule, Take 1 capsule (30 mg total) by mouth daily., Disp: 30 capsule, Rfl: 0   amphetamine -dextroamphetamine  (ADDERALL) 10 MG tablet,  Take 1 tablet (10 mg total) by mouth daily with breakfast., Disp: 30 tablet, Rfl: 0   Daily Multiple Vitamins tablet, Take 1 tablet by mouth daily., Disp: , Rfl:    norgestimate -ethinyl estradiol  (ORTHO-CYCLEN) 0.25-35 MG-MCG tablet, TAKE 1 TABLET BY MOUTH EVERY DAY, Disp: 84 tablet, Rfl: 0   pantoprazole  (PROTONIX ) 40 MG tablet, TAKE 1 TABLET BY MOUTH EVERY DAY, Disp: 90 tablet, Rfl: 0   ALPRAZolam  (XANAX ) 0.5 MG tablet, Take 1-2 tablets (0.5-1 mg total) by mouth 2 (two) times daily as needed for anxiety. Take sparingly., Disp: 60 tablet, Rfl: 0   [START ON 11/08/2024] amphetamine -dextroamphetamine  (ADDERALL XR) 30 MG 24 hr capsule, Take 1 capsule (30 mg total) by mouth in the morning., Disp: 30 capsule, Rfl: 0   [START ON 12/07/2024] amphetamine -dextroamphetamine  (ADDERALL XR) 30 MG 24 hr capsule, Take 1 capsule (30 mg total) by mouth daily., Disp: 30 capsule, Rfl: 0   Norgestimate -Ethinyl Estradiol  Triphasic (TRI-ESTARYLLA) 0.18/0.215/0.25 MG-35 MCG tablet, Take 1 tablet by mouth daily., Disp: , Rfl:    tiZANidine  (ZANAFLEX ) 4 MG tablet, Take 1 tablet (4 mg total) by mouth every 8 (eight) hours as needed for muscle spasms. (Patient not taking: Reported on 10/10/2024), Disp: 30 tablet, Rfl: 0   traZODone  (DESYREL ) 50 MG tablet, TAKE 1 TABLET BY MOUTH EVERYDAY AT BEDTIME (Patient not taking: Reported on 10/10/2024), Disp: 90 tablet, Rfl: 0 Medication Side Effects: Possible hypertension from Cymbalta , see notes from 01/14/2024 and 01/25/2024  Family Medical/ Social History: Changes? No  MENTAL HEALTH EXAM:  There were no vitals taken for this  visit.There is no height or weight on file to calculate BMI.  General Appearance: Casual and Well Groomed  Eye Contact:  Good  Speech:  Clear and Coherent and Normal Rate  Volume:  Normal  Mood:  Euthymic  Affect:  Congruent  Thought Process:  Goal Directed and Descriptions of Associations: Circumstantial  Orientation:  Full (Time, Place, and Person)  Thought  Content: Logical   Suicidal Thoughts:  No  Homicidal Thoughts:  No  Memory:  WNL  Judgement:  Good  Insight:  Good  Psychomotor Activity:  Normal  Concentration:  Concentration: Good and Attention Span: Good  Recall:  Good  Fund of Knowledge: Good  Language: Good  Assets:  Communication Skills Desire for Improvement Financial Resources/Insurance Housing Leisure Time Resilience Transportation Vocational/Educational  ADL's:  Intact  Cognition: WNL  Prognosis:  Good   DIAGNOSES:    ICD-10-CM   1. Mild depression  F32.A     2. Attention deficit hyperactivity disorder (ADHD), combined type  F90.2 amphetamine -dextroamphetamine  (ADDERALL XR) 30 MG 24 hr capsule    amphetamine -dextroamphetamine  (ADDERALL XR) 30 MG 24 hr capsule    3. Generalized anxiety disorder  F41.1     4. Fear of flying  F40.243      Receiving Psychotherapy: No   RECOMMENDATIONS:  PDMP reviewed.  Adderall filled 09/15/2024.  Xanax  filled 07/06/2024.   I provided approximately 20 minutes of face to face time during this encounter, including time spent before and after the visit in records review, medical decision making, counseling pertinent to today's visit, and charting.   She is doing well on current medications so no changes are needed.  Continue Xanax  0.5 mg, 1/2-1 twice daily as needed anxiety, take sparingly.  Take Xanax  1 mg for flying only. Continue Adderall XR  30 mg, 1 p.o. every morning. Continue Adderall 10 mg, 1/2 at lunch and 1/2 at supper prn.  OR 1 po at lunch prn.   Continue propranolol  10 mg, 1-2 p.o. 3 times daily as needed anxiety.  She rarely takes. Continue Trazodone  50 mg, po at bedtime prn.  Rarely needs now. Return in 6 months.  Verneita Cooks, PA-C

## 2024-10-18 ENCOUNTER — Other Ambulatory Visit

## 2024-10-28 ENCOUNTER — Telehealth: Admitting: Family Medicine

## 2024-10-28 DIAGNOSIS — M25551 Pain in right hip: Secondary | ICD-10-CM | POA: Diagnosis not present

## 2024-10-28 DIAGNOSIS — M545 Low back pain, unspecified: Secondary | ICD-10-CM

## 2024-10-28 MED ORDER — TIZANIDINE HCL 4 MG PO TABS: 4.0000 mg | ORAL_TABLET | Freq: Three times a day (TID) | ORAL | 0 refills | Status: AC | PRN

## 2024-10-28 NOTE — Progress Notes (Signed)
 " Virtual Visit Consent   Stacey Holland, you are scheduled for a virtual visit with a [facility] provider today. Just as with appointments in the office, your consent must be obtained to participate. Your consent will be active for this visit and any virtual visit you may have with one of our providers in the next 365 days. If you have a MyChart account, a copy of this consent can be sent to you electronically.  As this is a virtual visit, video technology does not allow for your provider to perform a traditional examination. This may limit your provider's ability to fully assess your condition. If your provider identifies any concerns that need to be evaluated in person or the need to arrange testing (such as labs, EKG, etc.), we will make arrangements to do so. Although advances in technology are sophisticated, we cannot ensure that it will always work on either your end or our end. If the connection with a video visit is poor, the visit may have to be switched to a telephone visit. With either a video or telephone visit, we are not always able to ensure that we have a secure connection.  By engaging in this virtual visit, you consent to the provision of healthcare and authorize for your insurance to be billed (if applicable) for the services provided during this visit. Depending on your insurance coverage, you may receive a charge related to this service.  I need to obtain your verbal consent now. Are you willing to proceed with your visit today? Stacey Holland has provided verbal consent on 10/28/2024 for a virtual visit (video or telephone). Roosvelt Mater, [place]  Date: 10/28/2024 5:33 PM   Virtual Visit via Video Note   IRoosvelt Mater, connected with  Stacey Holland  ([id number], 09-04-1996) on 10/28/2024 at  5:30 PM EST by a video-enabled telemedicine application and verified that I am speaking with the correct person using two identifiers.  Location: Patient: Virtual Visit Location  Patient: Home Provider: Virtual Visit Location Provider: Home Office   I discussed the limitations of evaluation and management by telemedicine and the availability of in person appointments. The patient expressed understanding and agreed to proceed.    History of Present Illness: Stacey Holland is a 28 y.o. who identifies as a female who was assigned female at birth, and is being seen today for c/o of taking Tizanadine for low back pain and hip pain.  Pt states it usually goes away on its own but sometimes the back pain becomes really bad and the Tizanadine is what she takes.  Pt states she just started a new job and is going to start a second job and feels like she will need it. Pt denies concern for pregnancy and denies breast feeding.   HPI: HPI  Problems:  Patient Active Problem List   Diagnosis Date Noted   De Quervain's tenosynovitis, right 04/18/2024   Chronic idiopathic constipation 07/03/2020   Fear of flying 06/09/2019   Panic anxiety syndrome 07/05/2018   Mixed anxiety and depressive disorder 04/27/2018   Gastroparesis 04/11/2018   Attention deficit hyperactivity disorder (ADHD) 01/14/2018   Vitamin D  deficiency 01/14/2018   Gastroesophageal reflux disease without esophagitis 01/14/2018   Eating disorder 01/14/2018   Attention and concentration deficit 12/01/2017   Bulimia (HCC) 12/01/2017   Routine general medical examination at a health care facility 08/26/2016   Polycystic ovaries 04/03/2016   GE reflux    Polycystic ovarian syndrome 12/19/2012  Allergies: Allergies[1] Medications: Current Medications[2]  Observations/Objective: Patient is well-developed, well-nourished in no acute distress.  Resting comfortably at home.  Head is normocephalic, atraumatic.  No labored breathing.  Speech is clear and coherent with logical content.  Patient is alert and oriented at baseline.    Assessment and Plan: 1. Pain of right hip - tiZANidine  (ZANAFLEX ) 4 MG tablet;  Take 1 tablet (4 mg total) by mouth every 8 (eight) hours as needed for up to 7 days for muscle spasms.  Dispense: 21 tablet; Refill: 0  2. Acute right-sided low back pain, unspecified whether sciatica present - tiZANidine  (ZANAFLEX ) 4 MG tablet; Take 1 tablet (4 mg total) by mouth every 8 (eight) hours as needed for up to 7 days for muscle spasms.  Dispense: 21 tablet; Refill: 0  -Medication refilled for one week -Pt advised to follow up with PCP for further evaluation of symptoms  -Pt to follow up with in person urgent care for worsening symptoms  Follow Up Instructions: I discussed the assessment and treatment plan with the patient. The patient was provided an opportunity to ask questions and all were answered. The patient agreed with the plan and demonstrated an understanding of the instructions.  A copy of instructions were sent to the patient via MyChart unless otherwise noted below.    The patient was advised to call back or seek an in-person evaluation if the symptoms worsen or if the condition fails to improve as anticipated.    Roosvelt Mater, PA-C     [1]  Allergies Allergen Reactions   Cymbalta  [Duloxetine  Hcl] Other (See Comments)    Elevated blood pressure   Guanfacine Other (See Comments)    Hallucinations  Hallucinations, Hallucinations   Nortriptyline  Other (See Comments)    Reports hallucinations   Other Itching, Swelling, Rash and Other (See Comments)    Reaction:  Burning Pt states that she is allergic to all hair dye.    [2]  Current Outpatient Medications:    ALPRAZolam  (XANAX ) 0.5 MG tablet, Take 1-2 tablets (0.5-1 mg total) by mouth 2 (two) times daily as needed for anxiety. Take sparingly., Disp: 60 tablet, Rfl: 0   [START ON 11/08/2024] amphetamine -dextroamphetamine  (ADDERALL XR) 30 MG 24 hr capsule, Take 1 capsule (30 mg total) by mouth in the morning., Disp: 30 capsule, Rfl: 0   [START ON 12/07/2024] amphetamine -dextroamphetamine  (ADDERALL XR) 30 MG 24  hr capsule, Take 1 capsule (30 mg total) by mouth daily., Disp: 30 capsule, Rfl: 0   amphetamine -dextroamphetamine  (ADDERALL XR) 30 MG 24 hr capsule, Take 1 capsule (30 mg total) by mouth daily., Disp: 30 capsule, Rfl: 0   amphetamine -dextroamphetamine  (ADDERALL) 10 MG tablet, Take 1 tablet (10 mg total) by mouth daily with breakfast., Disp: 30 tablet, Rfl: 0   Daily Multiple Vitamins tablet, Take 1 tablet by mouth daily., Disp: , Rfl:    norgestimate -ethinyl estradiol  (ORTHO-CYCLEN) 0.25-35 MG-MCG tablet, TAKE 1 TABLET BY MOUTH EVERY DAY, Disp: 84 tablet, Rfl: 0   Norgestimate -Ethinyl Estradiol  Triphasic (TRI-ESTARYLLA) 0.18/0.215/0.25 MG-35 MCG tablet, Take 1 tablet by mouth daily., Disp: , Rfl:    pantoprazole  (PROTONIX ) 40 MG tablet, TAKE 1 TABLET BY MOUTH EVERY DAY, Disp: 90 tablet, Rfl: 0   tiZANidine  (ZANAFLEX ) 4 MG tablet, Take 1 tablet (4 mg total) by mouth every 8 (eight) hours as needed for up to 7 days for muscle spasms., Disp: 21 tablet, Rfl: 0   traZODone  (DESYREL ) 50 MG tablet, TAKE 1 TABLET BY MOUTH EVERYDAY AT BEDTIME (Patient not  taking: Reported on 10/10/2024), Disp: 90 tablet, Rfl: 0  "

## 2024-10-28 NOTE — Patient Instructions (Signed)
 " Stacey JENEANE Stakes, thank you for joining Roosvelt Mater, PA-C for today's virtual visit.  While this provider is not your primary care provider (PCP), if your PCP is located in our provider database this encounter information will be shared with them immediately following your visit.   A [facility] MyChart account gives you access to today's visit and all your visits, tests, and labs performed at [facility]  click here if you don't have a [facility] MyChart account or go to [email or web address]  Consent: (Patient) Stacey Holland provided verbal consent for this virtual visit at the beginning of the encounter.  Current Medications:  Current Outpatient Medications:    ALPRAZolam  (XANAX ) 0.5 MG tablet, Take 1-2 tablets (0.5-1 mg total) by mouth 2 (two) times daily as needed for anxiety. Take sparingly., Disp: 60 tablet, Rfl: 0   [START ON 11/08/2024] amphetamine -dextroamphetamine  (ADDERALL XR) 30 MG 24 hr capsule, Take 1 capsule (30 mg total) by mouth in the morning., Disp: 30 capsule, Rfl: 0   [START ON 12/07/2024] amphetamine -dextroamphetamine  (ADDERALL XR) 30 MG 24 hr capsule, Take 1 capsule (30 mg total) by mouth daily., Disp: 30 capsule, Rfl: 0   amphetamine -dextroamphetamine  (ADDERALL XR) 30 MG 24 hr capsule, Take 1 capsule (30 mg total) by mouth daily., Disp: 30 capsule, Rfl: 0   amphetamine -dextroamphetamine  (ADDERALL) 10 MG tablet, Take 1 tablet (10 mg total) by mouth daily with breakfast., Disp: 30 tablet, Rfl: 0   Daily Multiple Vitamins tablet, Take 1 tablet by mouth daily., Disp: , Rfl:    norgestimate -ethinyl estradiol  (ORTHO-CYCLEN) 0.25-35 MG-MCG tablet, TAKE 1 TABLET BY MOUTH EVERY DAY, Disp: 84 tablet, Rfl: 0   Norgestimate -Ethinyl Estradiol  Triphasic (TRI-ESTARYLLA) 0.18/0.215/0.25 MG-35 MCG tablet, Take 1 tablet by mouth daily., Disp: , Rfl:    pantoprazole  (PROTONIX ) 40 MG tablet, TAKE 1 TABLET BY MOUTH EVERY DAY, Disp: 90 tablet, Rfl: 0   tiZANidine  (ZANAFLEX )  4 MG tablet, Take 1 tablet (4 mg total) by mouth every 8 (eight) hours as needed for up to 7 days for muscle spasms., Disp: 21 tablet, Rfl: 0   traZODone  (DESYREL ) 50 MG tablet, TAKE 1 TABLET BY MOUTH EVERYDAY AT BEDTIME (Patient not taking: Reported on 10/10/2024), Disp: 90 tablet, Rfl: 0   Medications ordered in this encounter:  Meds ordered this encounter  Medications   tiZANidine  (ZANAFLEX ) 4 MG tablet    Sig: Take 1 tablet (4 mg total) by mouth every 8 (eight) hours as needed for up to 7 days for muscle spasms.    Dispense:  21 tablet    Refill:  0     *If you need refills on other medications prior to your next appointment, please contact your pharmacy*  Follow-Up: Call back or seek an in-person evaluation if the symptoms worsen or if the condition fails to improve as anticipated.  [facility] [phone]  Other Instructions Acute Back Pain, Adult Acute back pain is sudden and usually short-lived. It is often caused by an injury to the muscles and tissues in the back. The injury may result from: A muscle, tendon, or ligament getting overstretched or torn. Ligaments are tissues that connect bones to each other. Lifting something improperly can cause a back strain. Wear and tear (degeneration) of the spinal disks. Spinal disks are circular tissue that provide cushioning between the bones of the spine (vertebrae). Twisting motions, such as while playing sports or doing yard work. A hit to the back. Arthritis. You may have a physical exam, lab  tests, and imaging tests to find the cause of your pain. Acute back pain usually goes away with rest and home care. Follow these instructions at home: Managing pain, stiffness, and swelling Take over-the-counter and prescription medicines only as told by your health care provider. Treatment may include medicines for pain and inflammation that are taken by mouth or applied to the skin, or muscle relaxants. Your health care  provider may recommend applying ice during the first 24-48 hours after your pain starts. To do this: Put ice in a plastic bag. Place a towel between your skin and the bag. Leave the ice on for 20 minutes, 2-3 times a day. Remove the ice if your skin turns bright red. This is very important. If you cannot feel pain, heat, or cold, you have a greater risk of damage to the area. If directed, apply heat to the affected area as often as told by your health care provider. Use the heat source that your health care provider recommends, such as a moist heat pack or a heating pad. Place a towel between your skin and the heat source. Leave the heat on for 20-30 minutes. Remove the heat if your skin turns bright red. This is especially important if you are unable to feel pain, heat, or cold. You have a greater risk of getting burned. Activity  Do not stay in bed. Staying in bed for more than 1-2 days can delay your recovery. Sit up and stand up straight. Avoid leaning forward when you sit or hunching over when you stand. If you work at a desk, sit close to it so you do not need to lean over. Keep your chin tucked in. Keep your neck drawn back, and keep your elbows bent at a 90-degree angle (right angle). Sit high and close to the steering wheel when you drive. Add lower back (lumbar) support to your car seat, if needed. Take short walks on even surfaces as soon as you are able. Try to increase the length of time you walk each day. Do not sit, drive, or stand in one place for more than 30 minutes at a time. Sitting or standing for long periods of time can put stress on your back. Do not drive or use heavy machinery while taking prescription pain medicine. Use proper lifting techniques. When you bend and lift, use positions that put less stress on your back: [place] your knees. Keep the load close to your body. Avoid twisting. Exercise regularly as told by your health care provider. Exercising helps your back  heal faster and helps prevent back injuries by keeping muscles strong and flexible. Work with a physical therapist to make a safe exercise program, as recommended by your health care provider. Do any exercises as told by your physical therapist. Lifestyle Maintain a healthy weight. Extra weight puts stress on your back and makes it difficult to have good posture. Avoid activities or situations that make you feel anxious or stressed. Stress and anxiety increase muscle tension and can make back pain worse. Learn ways to manage anxiety and stress, such as through exercise. General instructions Sleep on a firm mattress in a comfortable position. Try lying on your side with your knees slightly bent. If you lie on your back, put a pillow under your knees. Keep your head and neck in a straight line with your spine (neutral position) when using electronic equipment like smartphones or pads. To do this: Raise your smartphone or pad to look at it instead of  bending your head or neck to look down. Put the smartphone or pad at the level of your face while looking at the screen. Follow your treatment plan as told by your health care provider. This may include: Cognitive or behavioral therapy. Acupuncture or massage therapy. Meditation or yoga. Contact a health care provider if: You have pain that is not relieved with rest or medicine. You have increasing pain going down into your legs or buttocks. Your pain does not improve after 2 weeks. You have pain at night. You lose weight without trying. You have a fever or chills. You develop nausea or vomiting. You develop abdominal pain. Get help right away if: You develop new bowel or bladder control problems. You have unusual weakness or numbness in your arms or legs. You feel faint. These symptoms may represent a serious problem that is an emergency. Do not wait to see if the symptoms will go away. Get medical help right away. Call your local emergency  services (911 in the [place] Do not drive yourself to the hospital. Summary Acute back pain is sudden and usually short-lived. Use proper lifting techniques. When you bend and lift, use positions that put less stress on your back. Take over-the-counter and prescription medicines only as told by your health care provider, and apply heat or ice as told. This information is not intended to replace advice given to you by your health care provider. Make sure you discuss any questions you have with your health care provider. Document Revised: 01/17/2021 Document Reviewed: 01/17/2021 Elsevier Patient Education  2024 [employer]   If you have been instructed to have an in-person evaluation today at a local Urgent Care facility, please use the link below. It will take you to a list of all of our available [facility], including address, phone number and hours of operation. Please do not delay care.  [facility]  If you or a family member do not have a primary care provider, use the link below to schedule a visit and establish care. When you choose a [facility] primary care physician or advanced practice provider, you gain a long-term partner in health. Find a Primary Care Provider  Learn more about [facility]'s in-office and virtual care options: [facility] - Get Care Now  "

## 2024-11-10 ENCOUNTER — Other Ambulatory Visit: Payer: Self-pay | Admitting: Medical Genetics

## 2024-11-10 DIAGNOSIS — Z006 Encounter for examination for normal comparison and control in clinical research program: Secondary | ICD-10-CM

## 2025-04-11 ENCOUNTER — Ambulatory Visit: Admitting: Physician Assistant
